# Patient Record
Sex: Female | Born: 1950 | Race: Black or African American | Hispanic: No | Smoking: Never smoker
Health system: Southern US, Community
[De-identification: ages and names within clinical notes are randomized; demographics above are authoritative.]

## PROBLEM LIST (undated history)

## (undated) DIAGNOSIS — G4730 Sleep apnea, unspecified: Secondary | ICD-10-CM

## (undated) DIAGNOSIS — R251 Tremor, unspecified: Secondary | ICD-10-CM

## (undated) DIAGNOSIS — E119 Type 2 diabetes mellitus without complications: Secondary | ICD-10-CM

## (undated) DIAGNOSIS — R059 Cough, unspecified: Secondary | ICD-10-CM

## (undated) DIAGNOSIS — I4891 Unspecified atrial fibrillation: Secondary | ICD-10-CM

## (undated) DIAGNOSIS — I10 Essential (primary) hypertension: Secondary | ICD-10-CM

## (undated) DIAGNOSIS — I499 Cardiac arrhythmia, unspecified: Secondary | ICD-10-CM

## (undated) DIAGNOSIS — K219 Gastro-esophageal reflux disease without esophagitis: Secondary | ICD-10-CM

## (undated) DIAGNOSIS — Z87442 Personal history of urinary calculi: Secondary | ICD-10-CM

## (undated) DIAGNOSIS — R05 Cough: Secondary | ICD-10-CM

## (undated) HISTORY — PX: JOINT REPLACEMENT: SHX530

## (undated) HISTORY — PX: COLONOSCOPY: SHX174

## (undated) HISTORY — PX: ABDOMINAL HYSTERECTOMY: SHX81

---

## 2012-03-22 ENCOUNTER — Ambulatory Visit: Payer: Self-pay | Admitting: Internal Medicine

## 2013-01-24 ENCOUNTER — Ambulatory Visit: Payer: Self-pay | Admitting: Internal Medicine

## 2016-06-01 ENCOUNTER — Other Ambulatory Visit: Payer: Self-pay | Admitting: Family Medicine

## 2016-06-01 DIAGNOSIS — Z13820 Encounter for screening for osteoporosis: Secondary | ICD-10-CM

## 2016-06-01 DIAGNOSIS — Z1231 Encounter for screening mammogram for malignant neoplasm of breast: Secondary | ICD-10-CM

## 2016-06-23 ENCOUNTER — Ambulatory Visit: Payer: Self-pay | Attending: Family Medicine

## 2017-01-04 ENCOUNTER — Other Ambulatory Visit: Payer: Self-pay | Admitting: Family Medicine

## 2017-01-04 DIAGNOSIS — M79602 Pain in left arm: Secondary | ICD-10-CM

## 2017-01-04 DIAGNOSIS — M7989 Other specified soft tissue disorders: Secondary | ICD-10-CM

## 2017-01-06 ENCOUNTER — Ambulatory Visit
Admission: RE | Admit: 2017-01-06 | Discharge: 2017-01-06 | Disposition: A | Discharge status: Home / Self Care | Payer: Medicare Other | Source: Ambulatory Visit | Attending: Family Medicine | Admitting: Family Medicine

## 2017-01-06 DIAGNOSIS — M79602 Pain in left arm: Secondary | ICD-10-CM

## 2017-01-06 DIAGNOSIS — M7989 Other specified soft tissue disorders: Secondary | ICD-10-CM

## 2017-12-11 ENCOUNTER — Other Ambulatory Visit: Payer: Self-pay | Admitting: Family Medicine

## 2017-12-11 DIAGNOSIS — Z13820 Encounter for screening for osteoporosis: Secondary | ICD-10-CM

## 2018-03-05 ENCOUNTER — Encounter: Payer: Self-pay | Admitting: *Deleted

## 2018-03-07 ENCOUNTER — Ambulatory Visit: Payer: Medicare Other | Admitting: Anesthesiology

## 2018-03-07 ENCOUNTER — Encounter: Payer: Self-pay | Admitting: *Deleted

## 2018-03-07 ENCOUNTER — Ambulatory Visit
Admission: RE | Admit: 2018-03-07 | Discharge: 2018-03-07 | Disposition: A | Discharge status: Home / Self Care | Payer: Medicare Other | Source: Ambulatory Visit | Attending: Ophthalmology | Admitting: Ophthalmology

## 2018-03-07 ENCOUNTER — Encounter
Admission: RE | Disposition: A | Discharge status: Home / Self Care | Payer: Self-pay | Source: Ambulatory Visit | Attending: Ophthalmology

## 2018-03-07 ENCOUNTER — Other Ambulatory Visit: Payer: Self-pay

## 2018-03-07 DIAGNOSIS — E119 Type 2 diabetes mellitus without complications: Secondary | ICD-10-CM | POA: Diagnosis not present

## 2018-03-07 DIAGNOSIS — G4730 Sleep apnea, unspecified: Secondary | ICD-10-CM | POA: Diagnosis not present

## 2018-03-07 DIAGNOSIS — Z79899 Other long term (current) drug therapy: Secondary | ICD-10-CM | POA: Insufficient documentation

## 2018-03-07 DIAGNOSIS — I10 Essential (primary) hypertension: Secondary | ICD-10-CM | POA: Insufficient documentation

## 2018-03-07 DIAGNOSIS — H2512 Age-related nuclear cataract, left eye: Secondary | ICD-10-CM | POA: Diagnosis not present

## 2018-03-07 DIAGNOSIS — Z7984 Long term (current) use of oral hypoglycemic drugs: Secondary | ICD-10-CM | POA: Diagnosis not present

## 2018-03-07 DIAGNOSIS — E7800 Pure hypercholesterolemia, unspecified: Secondary | ICD-10-CM | POA: Insufficient documentation

## 2018-03-07 HISTORY — DX: Essential (primary) hypertension: I10

## 2018-03-07 HISTORY — DX: Type 2 diabetes mellitus without complications: E11.9

## 2018-03-07 HISTORY — DX: Sleep apnea, unspecified: G47.30

## 2018-03-07 HISTORY — DX: Cough: R05

## 2018-03-07 HISTORY — DX: Gastro-esophageal reflux disease without esophagitis: K21.9

## 2018-03-07 HISTORY — DX: Tremor, unspecified: R25.1

## 2018-03-07 HISTORY — DX: Cough, unspecified: R05.9

## 2018-03-07 HISTORY — PX: CATARACT EXTRACTION W/PHACO: SHX586

## 2018-03-07 LAB — GLUCOSE, CAPILLARY: Glucose-Capillary: 138 mg/dL — ABNORMAL HIGH (ref 65–99)

## 2018-03-07 SURGERY — PHACOEMULSIFICATION, CATARACT, WITH IOL INSERTION
Anesthesia: Monitor Anesthesia Care | Site: Eye | Laterality: Left | Wound class: "Clean "

## 2018-03-07 MED ORDER — TRYPAN BLUE 0.06 % OP SOLN
OPHTHALMIC | Status: AC
  Filled 2018-03-07: qty 0.5

## 2018-03-07 MED ORDER — LIDOCAINE HCL (PF) 4 % IJ SOLN
INTRAMUSCULAR | Status: AC
  Filled 2018-03-07: qty 5

## 2018-03-07 MED ORDER — MIDAZOLAM HCL 2 MG/2ML IJ SOLN
INTRAMUSCULAR | Status: DC | PRN
  Administered 2018-03-07 (×2): 1 mg via INTRAVENOUS

## 2018-03-07 MED ORDER — MOXIFLOXACIN HCL 0.5 % OP SOLN: 1.0000 [drp] | OPHTHALMIC | Status: DC | PRN

## 2018-03-07 MED ORDER — NA CHONDROIT SULF-NA HYALURON 40-17 MG/ML IO SOLN
INTRAOCULAR | Status: AC
  Filled 2018-03-07: qty 1

## 2018-03-07 MED ORDER — MIDAZOLAM HCL 2 MG/2ML IJ SOLN
INTRAMUSCULAR | Status: AC
  Filled 2018-03-07: qty 2

## 2018-03-07 MED ORDER — EPINEPHRINE PF 1 MG/ML IJ SOLN
INTRAMUSCULAR | Status: AC
  Filled 2018-03-07: qty 1

## 2018-03-07 MED ORDER — ARMC OPHTHALMIC DILATING DROPS
OPHTHALMIC | Status: AC
  Administered 2018-03-07: 08:00:00
  Filled 2018-03-07: qty 0.4

## 2018-03-07 MED ORDER — BSS IO SOLN
INTRAOCULAR | Status: DC | PRN
  Administered 2018-03-07: 4 mL via OPHTHALMIC

## 2018-03-07 MED ORDER — FENTANYL CITRATE (PF) 100 MCG/2ML IJ SOLN
INTRAMUSCULAR | Status: DC | PRN
  Administered 2018-03-07: 50 ug via INTRAVENOUS

## 2018-03-07 MED ORDER — NA CHONDROIT SULF-NA HYALURON 40-17 MG/ML IO SOLN
INTRAOCULAR | Status: DC | PRN
  Administered 2018-03-07 (×2): 1 mL via INTRAOCULAR

## 2018-03-07 MED ORDER — ARMC OPHTHALMIC DILATING DROPS
1.0000 "application " | OPHTHALMIC | Status: AC
  Administered 2018-03-07 (×3): 1 via OPHTHALMIC

## 2018-03-07 MED ORDER — SODIUM CHLORIDE 0.9 % IV SOLN
INTRAVENOUS | Status: DC
  Administered 2018-03-07: 08:00:00 via INTRAVENOUS

## 2018-03-07 MED ORDER — POVIDONE-IODINE 5 % OP SOLN
OPHTHALMIC | Status: AC
  Filled 2018-03-07: qty 30

## 2018-03-07 MED ORDER — CARBACHOL 0.01 % IO SOLN
INTRAOCULAR | Status: DC | PRN
  Administered 2018-03-07: 0.5 mL via INTRAOCULAR

## 2018-03-07 MED ORDER — POVIDONE-IODINE 5 % OP SOLN
OPHTHALMIC | Status: DC | PRN
  Administered 2018-03-07: 1 via OPHTHALMIC

## 2018-03-07 MED ORDER — NA CHONDROIT SULF-NA HYALURON 40-17 MG/ML IO SOLN
INTRAOCULAR | Status: AC
Start: 2018-03-07 — End: ?
  Filled 2018-03-07: qty 1

## 2018-03-07 MED ORDER — MOXIFLOXACIN HCL 0.5 % OP SOLN
OPHTHALMIC | Status: DC | PRN
  Administered 2018-03-07: 0.2 mL via OPHTHALMIC

## 2018-03-07 MED ORDER — FENTANYL CITRATE (PF) 100 MCG/2ML IJ SOLN
INTRAMUSCULAR | Status: AC
  Filled 2018-03-07: qty 2

## 2018-03-07 MED ORDER — MOXIFLOXACIN HCL 0.5 % OP SOLN
OPHTHALMIC | Status: AC
  Filled 2018-03-07: qty 3

## 2018-03-07 MED ORDER — EPINEPHRINE PF 1 MG/ML IJ SOLN
INTRAOCULAR | Status: DC | PRN
  Administered 2018-03-07: 200 mL via OPHTHALMIC

## 2018-03-07 SURGICAL SUPPLY — 17 items
GLOVE BIO SURGEON STRL SZ8 (GLOVE) ×3 IMPLANT
GLOVE BIOGEL M 6.5 STRL (GLOVE) ×3 IMPLANT
GLOVE SURG LX 8.0 MICRO (GLOVE) ×2
GLOVE SURG LX STRL 8.0 MICRO (GLOVE) ×1 IMPLANT
GOWN STRL REUS W/ TWL LRG LVL3 (GOWN DISPOSABLE) ×2 IMPLANT
GOWN STRL REUS W/TWL LRG LVL3 (GOWN DISPOSABLE) ×4
LABEL CATARACT MEDS ST (LABEL) ×3 IMPLANT
LENS IOL TECNIS ITEC 13.5 (Intraocular Lens) ×2 IMPLANT
PACK CATARACT (MISCELLANEOUS) ×3 IMPLANT
PACK CATARACT BRASINGTON LX (MISCELLANEOUS) ×3 IMPLANT
PACK EYE AFTER SURG (MISCELLANEOUS) ×3 IMPLANT
RING MALYGIN (MISCELLANEOUS) ×2 IMPLANT
SOL BSS BAG (MISCELLANEOUS) ×3
SOLUTION BSS BAG (MISCELLANEOUS) ×1 IMPLANT
SYR 5ML LL (SYRINGE) ×3 IMPLANT
WATER STERILE IRR 250ML POUR (IV SOLUTION) ×3 IMPLANT
WIPE NON LINTING 3.25X3.25 (MISCELLANEOUS) ×3 IMPLANT

## 2018-03-07 NOTE — Anesthesia Postprocedure Evaluation (Signed)
Anesthesia Post Note  [name]  Procedure(s) Performed: CATARACT EXTRACTION PHACO AND INTRAOCULAR LENS PLACEMENT (IOC) (Left Eye)  Patient location during evaluation: PACU Anesthesia Type: MAC Level of consciousness: awake, awake and alert, oriented and patient cooperative Pain management: pain level controlled Vital Signs Assessment: post-procedure vital signs reviewed and stable Respiratory status: spontaneous breathing, nonlabored ventilation and respiratory function stable Cardiovascular status: stable Anesthetic complications: no     Last Vitals:  Vitals:   03/07/18 0734 03/07/18 0927  BP: 130/79   Pulse: (!) 54 (!) 51  Resp: 18 18  Temp: 36.4 C (!) 36.3 C  SpO2: 98% 100%    Last Pain:  Vitals:   03/07/18 0927  TempSrc: Temporal  PainSc: 0-No pain                 Kelden Lavallee,  Cheryln Balcom R

## 2018-03-07 NOTE — H&P (Signed)
All labs reviewed. Abnormal studies sent to patients PCP when indicated.  Previous H&P reviewed, patient examined, there are NO CHANGES.  Cynthia Fajardo Porfilio6/5/20198:35 AM

## 2018-03-07 NOTE — Anesthesia Post-op Follow-up Note (Signed)
Anesthesia QCDR form completed.        

## 2018-03-07 NOTE — Transfer of Care (Signed)
Immediate Anesthesia Transfer of Care Note  [name]  Procedure(s) Performed: CATARACT EXTRACTION PHACO AND INTRAOCULAR LENS PLACEMENT (IOC) (Left Eye)  Patient Location: PACU  Anesthesia Type:MAC  Level of Consciousness: awake, alert  and oriented  Airway & Oxygen Therapy: Patient Spontanous Breathing  Post-op Assessment: Report given to RN and Post -op Vital signs reviewed and stable  Post vital signs: Reviewed and stable  Last Vitals:  Vitals Value Taken Time  BP    Temp    Pulse    Resp    SpO2      Last Pain:  Vitals:   03/07/18 0734  TempSrc: Oral         Complications: No apparent anesthesia complications

## 2018-03-07 NOTE — Anesthesia Preprocedure Evaluation (Signed)
Anesthesia Evaluation  Patient identified by MRN, date of birth, ID band Patient awake    Reviewed: Allergy & Precautions, H&P , NPO status , reviewed documented beta blocker date and time   Airway Mallampati: III  TM Distance: >3 FB Neck ROM: full    Dental  (+) Teeth Intact   Pulmonary sleep apnea ,    Pulmonary exam normal        Cardiovascular hypertension, Normal cardiovascular exam     Neuro/Psych    GI/Hepatic GERD  Medicated and Controlled,  Endo/Other  diabetes  Renal/GU      Musculoskeletal   Abdominal   Peds  Hematology   Anesthesia Other Findings Past Medical History: No date: Cough     Comment:  CHRONIC No date: Diabetes mellitus without complication (HCC) No date: GERD (gastroesophageal reflux disease) No date: Hypertension No date: Sleep apnea No date: Tremor     Comment:  HEAD  Past Surgical History: No date: ABDOMINAL HYSTERECTOMY No date: CESAREAN SECTION No date: COLONOSCOPY No date: JOINT REPLACEMENT  BMI    Body Mass Index:  33.09 kg/m    Head tremor discussed w surgical team, will attempt to reduce w sedation  Reproductive/Obstetrics                             Anesthesia Physical Anesthesia Plan  ASA: III  Anesthesia Plan: MAC   Post-op Pain Management:    Induction:   PONV Risk Score and Plan: Treatment may vary due to age or medical condition and TIVA  Airway Management Planned:   Additional Equipment:   Intra-op Plan:   Post-operative Plan:   Informed Consent: I have reviewed the patients History and Physical, chart, labs and discussed the procedure including the risks, benefits and alternatives for the proposed anesthesia with the patient or authorized representative who has indicated his/her understanding and acceptance.   Dental Advisory Given  Plan Discussed with: CRNA  Anesthesia Plan Comments:         Anesthesia Quick  Evaluation

## 2018-03-07 NOTE — Anesthesia Procedure Notes (Signed)
Date/Time: 03/07/2018 9:01 AM Performed by: Junious SilkNoles, Malik Ruffino, CRNA Pre-anesthesia Checklist: Patient identified, Emergency Drugs available, Suction available, Patient being monitored and Timeout performed Oxygen Delivery Method: Nasal cannula

## 2018-03-07 NOTE — Discharge Instructions (Addendum)
FOLLOW DR. PORFILIO'S POSTOP EYE DROP INSTRUCTION SHEET AS REVIEWED.  Eye Surgery Discharge Instructions  Expect mild scratchy sensation or mild soreness. DO NOT RUB YOUR EYE!  The day of surgery:  Minimal physical activity, but bed rest is not required  No reading, computer work, or close hand work  No bending, lifting, or straining.  May watch TV  For 24 hours:  No driving, legal decisions, or alcoholic beverages  Safety precautions  Eat anything you prefer: It is better to start with liquids, then soup then solid foods.  Solar shield eyeglasses should be worn for comfort in the sunlight/patch while sleeping  Resume all regular medications including aspirin or Coumadin if these were discontinued prior to surgery. You may shower, bathe, shave, or wash your hair. Tylenol may be taken for mild discomfort.  Call your doctor if you experience significant pain, nausea, or vomiting, fever > 101 or other signs of infection. [phone] or [phone] Specific instructions:  Follow-up Information    Galen ManilaPorfilio, William, MD Follow up.   Specialty:  Ophthalmology Why:  03/08/18 @ 1:20 PM Contact information: [address] [place] [address] [phone]

## 2018-03-07 NOTE — Op Note (Signed)
PREOPERATIVE DIAGNOSIS:  Nuclear sclerotic cataract of the left eye.   POSTOPERATIVE DIAGNOSIS:  Nuclear sclerotic cataract of the left eye.   OPERATIVE PROCEDURE: Procedure(s): CATARACT EXTRACTION PHACO AND INTRAOCULAR LENS PLACEMENT (IOC)   SURGEON:  Galen ManilaWilliam Staceyann Knouff, MD.   ANESTHESIA:  Anesthesiologist: Christia ReadingHowell, Scott T, MD CRNA: Casey BurkittHoang, Thuy, CRNA; Junious SilkNoles, Mark, CRNA  1.      Managed anesthesia care. 2.     0.731ml of Shugarcaine was instilled following the paracentesis   COMPLICATIONS:  None.   TECHNIQUE:   Stop and chop   DESCRIPTION OF PROCEDURE:  The patient was examined and consented in the preoperative holding area where the aforementioned topical anesthesia was applied to the left eye and then brought back to the Operating Room where the left eye was prepped and draped in the usual sterile ophthalmic fashion and a lid speculum was placed. A paracentesis was created with the side port blade and the anterior chamber was filled with viscoelastic. A near clear corneal incision was performed with the steel keratome. A continuous curvilinear capsulorrhexis was performed with a cystotome followed by the capsulorrhexis forceps. Hydrodissection and hydrodelineation were carried out with BSS on a blunt cannula. The lens was removed in a stop and chop  technique and the remaining cortical material was removed with the irrigation-aspiration handpiece. The capsular bag was inflated with viscoelastic and the Technis [id number] lens was placed in the capsular bag without complication. The remaining viscoelastic was removed from the eye with the irrigation-aspiration handpiece. The wounds were hydrated. The anterior chamber was flushed with Miostat and the eye was inflated to physiologic pressure. 0.751ml Vigamox was placed in the anterior chamber. The wounds were found to be water tight. The eye was dressed with Vigamox. The patient was given protective glasses to wear throughout the day and a shield with which  to sleep tonight. The patient was also given drops with which to begin a drop regimen today and will follow-up with me in one day. Implant Name Type Inv. Item Serial No. Manufacturer Lot No. LRB No. Used  LENS IOL DIOP 13.5 - [id number] Intraocular Lens LENS IOL DIOP 13.5 [id number] AMO  Left 1    Procedure(s) with comments: CATARACT EXTRACTION PHACO AND INTRAOCULAR LENS PLACEMENT (IOC) (Left) - Lot # [id number] H US: 04:19.8 AP%: 19.9 CDE: 51.70  Electronically signed: Galen ManilaWilliam Jaycion Treml 03/07/2018 9:25 AM

## 2019-01-07 ENCOUNTER — Inpatient Hospital Stay: Payer: Medicare Other | Admitting: Anesthesiology

## 2019-01-07 ENCOUNTER — Other Ambulatory Visit: Payer: Self-pay

## 2019-01-07 ENCOUNTER — Encounter
Admission: EM | Disposition: A | Discharge status: Home / Self Care | Payer: Self-pay | Source: Home / Self Care | Attending: Internal Medicine

## 2019-01-07 ENCOUNTER — Inpatient Hospital Stay
Admission: EM | Admit: 2019-01-07 | Discharge: 2019-01-08 | DRG: 661 | Disposition: A | Discharge status: Home / Self Care | Payer: Medicare Other | Attending: Internal Medicine | Admitting: Internal Medicine

## 2019-01-07 ENCOUNTER — Emergency Department: Payer: Medicare Other

## 2019-01-07 DIAGNOSIS — I10 Essential (primary) hypertension: Secondary | ICD-10-CM | POA: Diagnosis present

## 2019-01-07 DIAGNOSIS — K219 Gastro-esophageal reflux disease without esophagitis: Secondary | ICD-10-CM | POA: Diagnosis present

## 2019-01-07 DIAGNOSIS — R1011 Right upper quadrant pain: Secondary | ICD-10-CM

## 2019-01-07 DIAGNOSIS — E785 Hyperlipidemia, unspecified: Secondary | ICD-10-CM | POA: Diagnosis present

## 2019-01-07 DIAGNOSIS — Z79899 Other long term (current) drug therapy: Secondary | ICD-10-CM | POA: Diagnosis not present

## 2019-01-07 DIAGNOSIS — Z8249 Family history of ischemic heart disease and other diseases of the circulatory system: Secondary | ICD-10-CM

## 2019-01-07 DIAGNOSIS — N136 Pyonephrosis: Secondary | ICD-10-CM | POA: Diagnosis present

## 2019-01-07 DIAGNOSIS — E119 Type 2 diabetes mellitus without complications: Secondary | ICD-10-CM | POA: Diagnosis present

## 2019-01-07 DIAGNOSIS — N132 Hydronephrosis with renal and ureteral calculous obstruction: Secondary | ICD-10-CM | POA: Diagnosis not present

## 2019-01-07 DIAGNOSIS — N390 Urinary tract infection, site not specified: Secondary | ICD-10-CM

## 2019-01-07 DIAGNOSIS — G4730 Sleep apnea, unspecified: Secondary | ICD-10-CM | POA: Diagnosis present

## 2019-01-07 DIAGNOSIS — Z90710 Acquired absence of both cervix and uterus: Secondary | ICD-10-CM

## 2019-01-07 DIAGNOSIS — N3289 Other specified disorders of bladder: Secondary | ICD-10-CM | POA: Diagnosis present

## 2019-01-07 DIAGNOSIS — N23 Unspecified renal colic: Secondary | ICD-10-CM | POA: Diagnosis not present

## 2019-01-07 DIAGNOSIS — Z7984 Long term (current) use of oral hypoglycemic drugs: Secondary | ICD-10-CM

## 2019-01-07 DIAGNOSIS — N139 Obstructive and reflux uropathy, unspecified: Secondary | ICD-10-CM | POA: Diagnosis present

## 2019-01-07 DIAGNOSIS — N200 Calculus of kidney: Secondary | ICD-10-CM

## 2019-01-07 DIAGNOSIS — H409 Unspecified glaucoma: Secondary | ICD-10-CM | POA: Diagnosis present

## 2019-01-07 HISTORY — PX: CYSTOSCOPY W/ RETROGRADES: SHX1426

## 2019-01-07 HISTORY — PX: CYSTOSCOPY WITH STENT PLACEMENT: SHX5790

## 2019-01-07 LAB — CBC WITH DIFFERENTIAL/PLATELET
Abs Immature Granulocytes: 0.03 10*3/uL (ref 0.00–0.07)
Basophils Absolute: 0 10*3/uL (ref 0.0–0.1)
Basophils Relative: 0 %
Eosinophils Absolute: 0.2 10*3/uL (ref 0.0–0.5)
Eosinophils Relative: 2 %
HCT: 40.2 % (ref 36.0–46.0)
Hemoglobin: 13.1 g/dL (ref 12.0–15.0)
Immature Granulocytes: 0 %
Lymphocytes Relative: 27 %
Lymphs Abs: 2.5 10*3/uL (ref 0.7–4.0)
MCH: 26.5 pg (ref 26.0–34.0)
MCHC: 32.6 g/dL (ref 30.0–36.0)
MCV: 81.2 fL (ref 80.0–100.0)
Monocytes Absolute: 0.5 10*3/uL (ref 0.1–1.0)
Monocytes Relative: 5 %
Neutro Abs: 6.3 10*3/uL (ref 1.7–7.7)
Neutrophils Relative %: 66 %
Platelets: 291 10*3/uL (ref 150–400)
RBC: 4.95 MIL/uL (ref 3.87–5.11)
RDW: 14 % (ref 11.5–15.5)
WBC: 9.5 10*3/uL (ref 4.0–10.5)
nRBC: 0 % (ref 0.0–0.2)

## 2019-01-07 LAB — URINALYSIS, COMPLETE (UACMP) WITH MICROSCOPIC
Bilirubin Urine: NEGATIVE
Glucose, UA: NEGATIVE mg/dL
Ketones, ur: NEGATIVE mg/dL
Leukocytes,Ua: NEGATIVE
Nitrite: POSITIVE — AB
Protein, ur: NEGATIVE mg/dL
Specific Gravity, Urine: >1.046 — ABNORMAL HIGH (ref 1.005–1.030)
pH: 5 (ref 5.0–8.0)

## 2019-01-07 LAB — COMPREHENSIVE METABOLIC PANEL
ALT: 20 U/L (ref 0–44)
AST: 19 U/L (ref 15–41)
Albumin: 4.1 g/dL (ref 3.5–5.0)
Alkaline Phosphatase: 89 U/L (ref 38–126)
Anion gap: 11 (ref 5–15)
BUN: 18 mg/dL (ref 8–23)
CO2: 28 mmol/L (ref 22–32)
Calcium: 8.7 mg/dL — ABNORMAL LOW (ref 8.9–10.3)
Chloride: 103 mmol/L (ref 98–111)
Creatinine, Ser: 1.06 mg/dL — ABNORMAL HIGH (ref 0.44–1.00)
GFR calc Af Amer: >60 mL/min (ref >60)
GFR calc non Af Amer: 54 mL/min — ABNORMAL LOW (ref >60)
Glucose, Bld: 165 mg/dL — ABNORMAL HIGH (ref 70–99)
Potassium: 3.2 mmol/L — ABNORMAL LOW (ref 3.5–5.1)
Sodium: 142 mmol/L (ref 135–145)
Total Bilirubin: 0.6 mg/dL (ref 0.3–1.2)
Total Protein: 7.4 g/dL (ref 6.5–8.1)

## 2019-01-07 LAB — GLUCOSE, CAPILLARY
Glucose-Capillary: 120 mg/dL — ABNORMAL HIGH (ref 70–99)
Glucose-Capillary: 114 mg/dL — ABNORMAL HIGH (ref 70–99)
Glucose-Capillary: 177 mg/dL — ABNORMAL HIGH (ref 70–99)

## 2019-01-07 LAB — LIPASE, BLOOD: Lipase: 44 U/L (ref 11–51)

## 2019-01-07 SURGERY — CYSTOSCOPY, WITH STENT INSERTION
Anesthesia: General | Site: Ureter | Laterality: Right

## 2019-01-07 MED ORDER — SODIUM CHLORIDE 0.9 % IV SOLN
INTRAVENOUS | Status: DC
  Administered 2019-01-07 – 2019-01-08 (×2): via INTRAVENOUS

## 2019-01-07 MED ORDER — LIDOCAINE HCL (PF) 2 % IJ SOLN
INTRAMUSCULAR | Status: AC
  Filled 2019-01-07: qty 10

## 2019-01-07 MED ORDER — SODIUM CHLORIDE 0.9 % IV SOLN
1.0000 g | INTRAVENOUS | Status: DC
  Administered 2019-01-07 – 2019-01-08 (×2): 1 g via INTRAVENOUS
  Filled 2019-01-07: qty 1
  Filled 2019-01-07: qty 10

## 2019-01-07 MED ORDER — ONDANSETRON HCL 4 MG/2ML IJ SOLN
4.0000 mg | Freq: Once | INTRAMUSCULAR | Status: AC
  Administered 2019-01-07: 09:00:00 4 mg via INTRAVENOUS

## 2019-01-07 MED ORDER — FENTANYL CITRATE (PF) 100 MCG/2ML IJ SOLN
INTRAMUSCULAR | Status: AC
  Filled 2019-01-07: qty 2

## 2019-01-07 MED ORDER — IOHEXOL 300 MG/ML  SOLN
100.0000 mL | Freq: Once | INTRAMUSCULAR | Status: AC | PRN
  Administered 2019-01-07: 100 mL via INTRAVENOUS

## 2019-01-07 MED ORDER — HYDROCODONE-ACETAMINOPHEN 5-325 MG PO TABS: 1.0000 | ORAL_TABLET | ORAL | Status: DC | PRN

## 2019-01-07 MED ORDER — DEXAMETHASONE SODIUM PHOSPHATE 10 MG/ML IJ SOLN
INTRAMUSCULAR | Status: AC
  Filled 2019-01-07: qty 1

## 2019-01-07 MED ORDER — LIDOCAINE HCL (CARDIAC) PF 100 MG/5ML IV SOSY
PREFILLED_SYRINGE | INTRAVENOUS | Status: DC | PRN
  Administered 2019-01-07: 40 mg via INTRAVENOUS

## 2019-01-07 MED ORDER — ROCURONIUM BROMIDE 100 MG/10ML IV SOLN
INTRAVENOUS | Status: DC | PRN
  Administered 2019-01-07: 30 mg via INTRAVENOUS

## 2019-01-07 MED ORDER — PROMETHAZINE HCL 25 MG/ML IJ SOLN: 6.2500 mg | INTRAMUSCULAR | Status: DC | PRN

## 2019-01-07 MED ORDER — MORPHINE SULFATE (PF) 4 MG/ML IV SOLN
4.0000 mg | Freq: Once | INTRAVENOUS | Status: AC
  Administered 2019-01-07: 4 mg via INTRAVENOUS
  Filled 2019-01-07: qty 1

## 2019-01-07 MED ORDER — PROPOFOL 10 MG/ML IV BOLUS
INTRAVENOUS | Status: DC | PRN
  Administered 2019-01-07: 130 mg via INTRAVENOUS

## 2019-01-07 MED ORDER — SUCCINYLCHOLINE CHLORIDE 20 MG/ML IJ SOLN
INTRAMUSCULAR | Status: DC | PRN
  Administered 2019-01-07: 100 mg via INTRAVENOUS

## 2019-01-07 MED ORDER — ONDANSETRON HCL 4 MG/2ML IJ SOLN
INTRAMUSCULAR | Status: DC | PRN
  Administered 2019-01-07: 4 mg via INTRAVENOUS

## 2019-01-07 MED ORDER — ACETAMINOPHEN 650 MG RE SUPP: 650.0000 mg | Freq: Four times a day (QID) | RECTAL | Status: DC | PRN

## 2019-01-07 MED ORDER — MIDAZOLAM HCL 2 MG/2ML IJ SOLN
INTRAMUSCULAR | Status: AC
  Filled 2019-01-07: qty 2

## 2019-01-07 MED ORDER — PROPOFOL 10 MG/ML IV BOLUS
INTRAVENOUS | Status: AC
  Filled 2019-01-07: qty 20

## 2019-01-07 MED ORDER — ONDANSETRON HCL 4 MG/2ML IJ SOLN
INTRAMUSCULAR | Status: AC
  Filled 2019-01-07: qty 2

## 2019-01-07 MED ORDER — SENNOSIDES-DOCUSATE SODIUM 8.6-50 MG PO TABS: 1.0000 | ORAL_TABLET | Freq: Every evening | ORAL | Status: DC | PRN

## 2019-01-07 MED ORDER — BRIMONIDINE TARTRATE-TIMOLOL 0.2-0.5 % OP SOLN
1.0000 [drp] | Freq: Every day | OPHTHALMIC | Status: DC
  Filled 2019-01-07: qty 5

## 2019-01-07 MED ORDER — PHENYLEPHRINE HCL 10 MG/ML IJ SOLN
INTRAMUSCULAR | Status: AC
  Filled 2019-01-07: qty 1

## 2019-01-07 MED ORDER — LACTATED RINGERS IV SOLN
INTRAVENOUS | Status: DC | PRN
  Administered 2019-01-07: 15:00:00 via INTRAVENOUS

## 2019-01-07 MED ORDER — SODIUM CHLORIDE 0.9 % IV SOLN: 1.0000 g | Freq: Once | INTRAVENOUS | Status: DC

## 2019-01-07 MED ORDER — ONDANSETRON HCL 4 MG/2ML IJ SOLN
4.0000 mg | Freq: Once | INTRAMUSCULAR | Status: AC
  Administered 2019-01-07: 4 mg via INTRAVENOUS
  Filled 2019-01-07: qty 2

## 2019-01-07 MED ORDER — INSULIN ASPART 100 UNIT/ML ~~LOC~~ SOLN: 0.0000 [IU] | Freq: Every day | SUBCUTANEOUS | Status: DC

## 2019-01-07 MED ORDER — ONDANSETRON HCL 4 MG/2ML IJ SOLN: 4.0000 mg | Freq: Four times a day (QID) | INTRAMUSCULAR | Status: DC | PRN

## 2019-01-07 MED ORDER — INSULIN ASPART 100 UNIT/ML ~~LOC~~ SOLN
0.0000 [IU] | Freq: Three times a day (TID) | SUBCUTANEOUS | Status: DC
  Administered 2019-01-08: 2 [IU] via SUBCUTANEOUS
  Administered 2019-01-08: 3 [IU] via SUBCUTANEOUS
  Filled 2019-01-07 (×2): qty 1

## 2019-01-07 MED ORDER — MIDAZOLAM HCL 2 MG/2ML IJ SOLN
INTRAMUSCULAR | Status: DC | PRN
  Administered 2019-01-07: 1 mg via INTRAVENOUS

## 2019-01-07 MED ORDER — LATANOPROST 0.005 % OP SOLN
1.0000 [drp] | Freq: Every day | OPHTHALMIC | Status: DC
  Administered 2019-01-07: 22:00:00 1 [drp] via OPHTHALMIC
  Filled 2019-01-07 (×2): qty 2.5

## 2019-01-07 MED ORDER — IOHEXOL 240 MG/ML SOLN
50.0000 mL | Freq: Once | INTRAMUSCULAR | Status: AC | PRN
  Administered 2019-01-07: 50 mL via ORAL

## 2019-01-07 MED ORDER — ACETAMINOPHEN 325 MG PO TABS: 650.0000 mg | ORAL_TABLET | Freq: Four times a day (QID) | ORAL | Status: DC | PRN

## 2019-01-07 MED ORDER — ATORVASTATIN CALCIUM 20 MG PO TABS
40.0000 mg | ORAL_TABLET | Freq: Every day | ORAL | Status: DC
  Administered 2019-01-07 – 2019-01-08 (×2): 40 mg via ORAL
  Filled 2019-01-07 (×2): qty 2

## 2019-01-07 MED ORDER — IOHEXOL 180 MG/ML  SOLN
INTRAMUSCULAR | Status: DC | PRN
  Administered 2019-01-07: 7 mL

## 2019-01-07 MED ORDER — SUGAMMADEX SODIUM 200 MG/2ML IV SOLN
INTRAVENOUS | Status: AC
  Filled 2019-01-07: qty 2

## 2019-01-07 MED ORDER — ONDANSETRON HCL 4 MG PO TABS: 4.0000 mg | ORAL_TABLET | Freq: Four times a day (QID) | ORAL | Status: DC | PRN

## 2019-01-07 MED ORDER — FENTANYL CITRATE (PF) 100 MCG/2ML IJ SOLN: 25.0000 ug | INTRAMUSCULAR | Status: DC | PRN

## 2019-01-07 MED ORDER — FENTANYL CITRATE (PF) 100 MCG/2ML IJ SOLN
INTRAMUSCULAR | Status: DC | PRN
  Administered 2019-01-07: 50 ug via INTRAVENOUS
  Administered 2019-01-07: 25 ug via INTRAVENOUS

## 2019-01-07 MED ORDER — BRIMONIDINE TARTRATE 0.2 % OP SOLN
1.0000 [drp] | Freq: Every day | OPHTHALMIC | Status: DC
  Administered 2019-01-07 – 2019-01-08 (×2): 1 [drp] via OPHTHALMIC
  Filled 2019-01-07: qty 5

## 2019-01-07 MED ORDER — OXYBUTYNIN CHLORIDE 5 MG PO TABS: 5.0000 mg | ORAL_TABLET | Freq: Three times a day (TID) | ORAL | Status: DC | PRN

## 2019-01-07 MED ORDER — PHENYLEPHRINE HCL 10 MG/ML IJ SOLN
INTRAMUSCULAR | Status: DC | PRN
  Administered 2019-01-07: 100 ug via INTRAVENOUS

## 2019-01-07 MED ORDER — LATANOPROST 0.005 % OP SOLN
1.0000 [drp] | Freq: Every day | OPHTHALMIC | Status: DC
  Filled 2019-01-07: qty 2.5

## 2019-01-07 MED ORDER — POTASSIUM CHLORIDE CRYS ER 20 MEQ PO TBCR
40.0000 meq | EXTENDED_RELEASE_TABLET | Freq: Once | ORAL | Status: AC
  Administered 2019-01-07: 18:00:00 40 meq via ORAL
  Filled 2019-01-07: qty 2

## 2019-01-07 MED ORDER — MORPHINE SULFATE (PF) 2 MG/ML IV SOLN
2.0000 mg | Freq: Once | INTRAVENOUS | Status: AC
  Administered 2019-01-07: 07:00:00 2 mg via INTRAVENOUS
  Filled 2019-01-07: qty 1

## 2019-01-07 MED ORDER — SUGAMMADEX SODIUM 200 MG/2ML IV SOLN
INTRAVENOUS | Status: DC | PRN
  Administered 2019-01-07: 181.4 mg via INTRAVENOUS

## 2019-01-07 MED ORDER — MORPHINE SULFATE (PF) 2 MG/ML IV SOLN
2.0000 mg | Freq: Once | INTRAVENOUS | Status: AC
  Administered 2019-01-07: 06:00:00 2 mg via INTRAVENOUS
  Filled 2019-01-07: qty 1

## 2019-01-07 MED ORDER — ENOXAPARIN SODIUM 40 MG/0.4ML ~~LOC~~ SOLN
40.0000 mg | SUBCUTANEOUS | Status: DC
  Administered 2019-01-08: 40 mg via SUBCUTANEOUS
  Filled 2019-01-07: qty 0.4

## 2019-01-07 MED ORDER — EPHEDRINE SULFATE 50 MG/ML IJ SOLN
INTRAMUSCULAR | Status: AC
  Filled 2019-01-07: qty 1

## 2019-01-07 MED ORDER — MORPHINE SULFATE (PF) 2 MG/ML IV SOLN: 2.0000 mg | INTRAVENOUS | Status: DC | PRN

## 2019-01-07 MED ORDER — TIMOLOL MALEATE 0.5 % OP SOLN
1.0000 [drp] | Freq: Every day | OPHTHALMIC | Status: DC
  Administered 2019-01-07 – 2019-01-08 (×2): 1 [drp] via OPHTHALMIC
  Filled 2019-01-07: qty 5

## 2019-01-07 SURGICAL SUPPLY — 19 items
BAG DRAIN CYSTO-URO LG1000N (MISCELLANEOUS) ×3 IMPLANT
BRUSH SCRUB EZ  4% CHG (MISCELLANEOUS)
BRUSH SCRUB EZ 4% CHG (MISCELLANEOUS) IMPLANT
CATH URETL 5X70 OPEN END (CATHETERS) ×3 IMPLANT
GLIDEWIRE STR 0.035 150CM 3CM (WIRE) ×3 IMPLANT
GLOVE BIO SURGEON STRL SZ8 (GLOVE) ×3 IMPLANT
GOWN STRL REUS W/ TWL XL LVL3 (GOWN DISPOSABLE) ×1 IMPLANT
GOWN STRL REUS W/TWL XL LVL3 (GOWN DISPOSABLE) ×2
GUIDEWIRE STR DUAL SENSOR (WIRE) ×3 IMPLANT
KIT TURNOVER CYSTO (KITS) ×3 IMPLANT
PACK CYSTO AR (MISCELLANEOUS) ×3 IMPLANT
SET CYSTO W/LG BORE CLAMP LF (SET/KITS/TRAYS/PACK) ×3 IMPLANT
SOL .9 NS 3000ML IRR  AL (IV SOLUTION) ×2
SOL .9 NS 3000ML IRR UROMATIC (IV SOLUTION) ×1 IMPLANT
STENT URET 6FRX24 CONTOUR (STENTS) ×3 IMPLANT
STENT URET 6FRX26 CONTOUR (STENTS) IMPLANT
SURGILUBE 2OZ TUBE FLIPTOP (MISCELLANEOUS) ×3 IMPLANT
SYR 10ML LL (SYRINGE) ×3 IMPLANT
WATER STERILE IRR 1000ML POUR (IV SOLUTION) ×3 IMPLANT

## 2019-01-07 NOTE — ED Provider Notes (Signed)
[facility] Emergency Department Provider Note    First MD Initiated Contact with Patient 01/07/19 [phone]     (approximate)  I have reviewed the triage vital signs and the nursing notes.   HISTORY  Chief Complaint Abdominal Pain    HPI Cynthia Lucas is a 68 y.o. female with medical history as listed below including diabetes hypertension presents emergency department with cute onset of right upper quadrant abdominal pain which currently 10 out of 10 with associated nausea and vomiting which began this morning.        Past Medical History:  Diagnosis Date  . Cough    CHRONIC  . Diabetes mellitus without complication (HCC)   . GERD (gastroesophageal reflux disease)   . Hypertension   . Sleep apnea   . Tremor    HEAD    Patient Active Problem List   Diagnosis Date Noted  . Obstructive uropathy 01/07/2019    Past Surgical History:  Procedure Laterality Date  . ABDOMINAL HYSTERECTOMY    . CATARACT EXTRACTION W/PHACO Left 03/07/2018   Procedure: CATARACT EXTRACTION PHACO AND INTRAOCULAR LENS PLACEMENT (IOC);  Surgeon: Galen Manila, MD;  Location: [facility] ORS;  Service: Ophthalmology;  Laterality: Left;  Lot # [id number] H [place]: 04:19.8 AP%: 19.9 CDE: 51.70  . CESAREAN SECTION    . COLONOSCOPY    . JOINT REPLACEMENT      Prior to Admission medications   Medication Sig Start Date End Date Taking? Authorizing Provider  atorvastatin (LIPITOR) 40 MG tablet Take 40 mg by mouth daily.   Yes [provider]  bimatoprost (LUMIGAN) 0.03 % ophthalmic solution Place 1 drop into the left eye at bedtime.   Yes [provider]  Brimonidine Tartrate-Timolol (COMBIGAN OP) Apply 1 drop to eye once. To each eye   Yes [provider]  hydrochlorothiazide (HYDRODIURIL) 25 MG tablet Take 25 mg by mouth daily.   Yes [provider]  metFORMIN (GLUCOPHAGE) 500 MG tablet Take 500 mg by mouth once.   Yes [provider]   ranitidine (ZANTAC) 150 MG tablet Take 150 mg by mouth 2 (two) times daily.   Yes [provider]    Allergies Patient has no known allergies.  Family History  Problem Relation Age of Onset  . Heart disease Mother     Social History Social History   Tobacco Use  . Smoking status: Never Smoker  . Smokeless tobacco: Never Used  Substance Use Topics  . Alcohol use: Yes    Comment: OCCAS  . Drug use: Not on file    Review of Systems Constitutional: No fever/chills Eyes: No visual changes. ENT: No sore throat. Cardiovascular: Denies chest pain. Respiratory: Denies shortness of breath. Gastrointestinal: Positive for abdominal pain nausea and vomiting no diarrhea.  No constipation. Genitourinary: Negative for dysuria. Musculoskeletal: Negative for neck pain.  Negative for back pain. Integumentary: Negative for rash. Neurological: Negative for headaches, focal weakness or numbness.  ____________________________________________   PHYSICAL EXAM:  VITAL SIGNS: ED Triage Vitals  Enc Vitals Group     BP 01/07/19 0603 (!) 151/133     Pulse Rate 01/07/19 0603 63     Resp 01/07/19 0603 18     Temp 01/07/19 0603 98.6 F (37 C)     Temp Source 01/07/19 0603 Oral     SpO2 01/07/19 0603 97 %     Weight 01/07/19 0555 90.7 kg (200 lb)     Height 01/07/19 0555 1.702 m (5\' 7" )  Head Circumference --      Peak Flow --      Pain Score 01/07/19 0555 8     Pain Loc --      Pain Edu? --      Excl. in GC? --     Constitutional: Alert and oriented.Apparent discomfort Eyes: Conjunctivae are normal.  Head: Atraumatic. Neck: No stridor.   Cardiovascular: Normal rate, regular rhythm. Good peripheral circulation. Grossly normal heart sounds. Respiratory: Normal respiratory effort.  No retractions. Lungs CTAB. Gastrointestinal: Soft and nontender. No distention.  Musculoskeletal: No lower extremity tenderness nor edema. No gross deformities of extremities. Neurologic:   Normal speech and language. No gross focal neurologic deficits are appreciated.  Skin:  Skin is warm, dry and intact. No rash noted. Psychiatric: Mood and affect are normal. Speech and behavior are normal.  ____________________________________________   LABS (all labs ordered are listed, but only abnormal results are displayed)  Labs Reviewed  COMPREHENSIVE METABOLIC PANEL - Abnormal; Notable for the following components:      Result Value   Potassium 3.2 (*)    Glucose, Bld 165 (*)    Creatinine, Ser 1.06 (*)    Calcium 8.7 (*)    GFR calc non Af Amer 54 (*)    All other components within normal limits  URINALYSIS, COMPLETE (UACMP) WITH MICROSCOPIC - Abnormal; Notable for the following components:   Color, Urine YELLOW (*)    APPearance CLEAR (*)    Specific Gravity, Urine >1.046 (*)    Hgb urine dipstick SMALL (*)    Nitrite POSITIVE (*)    Bacteria, UA RARE (*)    All other components within normal limits  GLUCOSE, CAPILLARY - Abnormal; Notable for the following components:   Glucose-Capillary 120 (*)    All other components within normal limits  GLUCOSE, CAPILLARY - Abnormal; Notable for the following components:   Glucose-Capillary 114 (*)    All other components within normal limits  GLUCOSE, CAPILLARY - Abnormal; Notable for the following components:   Glucose-Capillary 177 (*)    All other components within normal limits  URINE CULTURE  URINE CULTURE  CBC WITH DIFFERENTIAL/PLATELET  LIPASE, BLOOD  HIV ANTIBODY (ROUTINE TESTING W REFLEX)  BASIC METABOLIC PANEL  CBC     RADIOLOGY I, [place] Cynthia Lucas, personally viewed and evaluated these images (plain radiographs) as part of my medical decision making, as well as reviewing the written report by the radiologist.  ED MD interpretation:  Gallstones  Official radiology report(s): Ct Abdomen Pelvis W Contrast  Result Date: 01/07/2019 CLINICAL DATA:  Right-sided abdominal pain. Hydronephrosis on ultrasound of the  abdomen EXAM: CT ABDOMEN AND PELVIS WITH CONTRAST TECHNIQUE: Multidetector CT imaging of the abdomen and pelvis was performed using the standard protocol following bolus administration of intravenous contrast. CONTRAST:  OMNIPAQUE IOHEXOL 300 MG/ML  SOLN COMPARISON:  Right upper quadrant ultrasound from earlier today FINDINGS: Lower chest: Coronary atherosclerosis. Mild scarring in the right lower lobe. Hepatobiliary: No focal liver abnormality.Numerous gallstones by ultrasound are not apparent by CT. Pancreas: Benign-appearing coarse calcification. Spleen: Unremarkable. Adrenals/Urinary Tract: Negative adrenals. Right hydronephrosis with delayed renal expansion and extensive perinephric edema. There is right hydroureter to the level of a 4 mm stone at the iliac crossing. 4 mm stone is also seen at the right UPJ. No left-sided hydronephrosis or ureteral calculus. Unremarkable bladder. Stomach/Bowel: No obstruction. No appendicitis. Mild colonic diverticulosis. Vascular/Lymphatic: No acute vascular abnormality. No mass or adenopathy. Reproductive:Hysterectomy. Other: No ascites or pneumoperitoneum. Musculoskeletal:  No acute abnormalities. Spondylosis with multilevel thoracic ankylosis. Lower lumbar degenerative facet spurring with L4-5 and L5-S1 ankylosis. IMPRESSION: 1. Two obstructing right renal calculi, both measuring 4 mm. These are located at the UPJ and iliac crossing. 2. Cholelithiasis by ultrasound is largely occult by CT. No evidence of cholecystitis. 3. Coronary atherosclerosis. Electronically Signed   By: Marnee SpringJonathon  Watts M.D.   On: 01/07/2019 08:58   [place] Abdomen Limited Ruq  Result Date: 01/07/2019 CLINICAL DATA:  Right upper quadrant pain EXAM: ULTRASOUND ABDOMEN LIMITED RIGHT UPPER QUADRANT COMPARISON:  None. FINDINGS: Gallbladder: Numerous gallstones filling the gallbladder with wall echo shadow sign. Where visible the wall of the gallbladder is not thickened. No visible pericholecystic edema.  There is focal tenderness per report. Common bile duct: Diameter: 7 mm.  Where visualized, no filling defect. Liver: No focal lesion identified. Within normal limits in parenchymal echogenicity. Portal vein is patent on color Doppler imaging with normal direction of blood flow towards the liver. Other: Incidental right moderate hydronephrosis. IMPRESSION: 1. Gallstones fill the gallbladder. The gallbladder is focally tender but there is no visible wall thickening or pericholecystic edema typical of acute cholecystitis. The degree of shadowing calculi limits assessment of the gallbladder. 2. Borderline common bile duct dilatation, please correlate with biliary labs. 3. Right hydronephrosis. Electronically Signed   By: Marnee SpringJonathon  Watts M.D.   On: 01/07/2019 07:11      Procedures   ____________________________________________   INITIAL IMPRESSION / MDM / ASSESSMENT AND PLAN / ED COURSE  As part of my medical decision making, I reviewed the following data within the electronic MEDICAL RECORD NUMBER   Cynthia KeySharon Webb was evaluated in Emergency Department on 01/07/2019 for the symptoms described in the history of present illness. She was evaluated in the context of the global COVID-19 pandemic, which necessitated consideration that the patient might be at risk for infection with the SARS-CoV-2 virus that causes COVID-19. Institutional protocols and algorithms that pertain to the evaluation of patients at risk for COVID-19 are in a state of rapid change based on information released by regulatory bodies including the CDC and federal and state organizations. These policies and algorithms were followed during the patient's care in the ED.    68 year old female presented with above-stated history and physical exam consistent with possible cholelithiasis, choledocholithiasis, ureterolithiasis.  Ultrasound performed revealed evidence of cholelithiasis with dilated ureter and as such CT scan of the abdomen pelvis was  ordered and pending at this time.  On reevaluation after patient received morphine current pain score is 5 out of 10 however patient now states pain predominantly in the right lower quadrant increase in the possibility of possible ureterolithiasis.  Patient will be given an additional 2 mg of morphine now.  Patient's care transferred to Dr. Cyril LoosenKinner __________________________________  FINAL CLINICAL IMPRESSION(S) / ED DIAGNOSES  Final diagnoses:  RUQ pain  Kidney stone  Urinary tract infection without hematuria, site unspecified     MEDICATIONS GIVEN DURING THIS VISIT:  Medications  atorvastatin (LIPITOR) tablet 40 mg (40 mg Oral Given 01/07/19 1756)  enoxaparin (LOVENOX) injection 40 mg (has no administration in time range)  0.9 %  sodium chloride infusion ( Intravenous Stopped 01/07/19 1801)  acetaminophen (TYLENOL) tablet 650 mg (has no administration in time range)    Or  acetaminophen (TYLENOL) suppository 650 mg (has no administration in time range)  HYDROcodone-acetaminophen (NORCO/VICODIN) 5-325 MG per tablet 1-2 tablet (has no administration in time range)  senna-docusate (Senokot-S) tablet 1 tablet (has no administration in  time range)  ondansetron (ZOFRAN) tablet 4 mg (has no administration in time range)    Or  ondansetron (ZOFRAN) injection 4 mg (has no administration in time range)  cefTRIAXone (ROCEPHIN) 1 g in sodium chloride 0.9 % 100 mL IVPB (0 g Intravenous Stopped 01/07/19 1720)  morphine 2 MG/ML injection 2 mg ( Intravenous MAR Unhold 01/07/19 1655)  insulin aspart (novoLOG) injection 0-15 Units (0 Units Subcutaneous Not Given 01/07/19 1719)  insulin aspart (novoLOG) injection 0-5 Units (0 Units Subcutaneous Not Given 01/07/19 2125)  oxybutynin (DITROPAN) tablet 5 mg (has no administration in time range)  brimonidine (ALPHAGAN) 0.2 % ophthalmic solution 1 drop (1 drop Both Eyes Given 01/07/19 1840)    And  timolol (TIMOPTIC) 0.5 % ophthalmic solution 1 drop (1 drop Both Eyes  Given 01/07/19 1838)  latanoprost (XALATAN) 0.005 % ophthalmic solution 1 drop (1 drop Left Eye Given 01/07/19 2143)  morphine 2 MG/ML injection 2 mg (2 mg Intravenous Given 01/07/19 0614)  ondansetron (ZOFRAN) injection 4 mg (4 mg Intravenous Given 01/07/19 0613)  morphine 4 MG/ML injection 4 mg (4 mg Intravenous Given 01/07/19 0657)  morphine 2 MG/ML injection 2 mg (2 mg Intravenous Given 01/07/19 0728)  iohexol (OMNIPAQUE) 240 MG/ML injection 50 mL (50 mLs Oral Contrast Given 01/07/19 0729)  ondansetron (ZOFRAN) injection 4 mg ( Intravenous Not Given 01/07/19 1720)  iohexol (OMNIPAQUE) 300 MG/ML solution 100 mL (100 mLs Intravenous Contrast Given 01/07/19 0841)  potassium chloride SA (K-DUR,KLOR-CON) CR tablet 40 mEq (40 mEq Oral Given 01/07/19 1758)     ED Discharge Orders    None       Note:  This document was prepared using Dragon voice recognition software and may include unintentional dictation errors.   Darci Current, MD 01/07/19 [phone]

## 2019-01-07 NOTE — ED Notes (Signed)
Pt transported to OR at this time  

## 2019-01-07 NOTE — Anesthesia Procedure Notes (Addendum)
Procedure Name: Intubation Date/Time: 01/07/2019 3:22 PM Performed by: Allean Found, CRNA Pre-anesthesia Checklist: Patient identified, Patient being monitored, Timeout performed, Emergency Drugs available and Suction available Patient Re-evaluated:Patient Re-evaluated prior to induction Oxygen Delivery Method: Circle system utilized Preoxygenation: Pre-oxygenation with 100% oxygen Induction Type: IV induction Ventilation: Mask ventilation without difficulty Laryngoscope Size: Mac and 3 Grade View: Grade I Tube type: Oral Tube size: 7.0 mm Number of attempts: 1 Airway Equipment and Method: Stylet Placement Confirmation: ETT inserted through vocal cords under direct vision,  positive ETCO2 and breath sounds checked- equal and bilateral Secured at: 21 cm Tube secured with: Tape Dental Injury: Teeth and Oropharynx as per pre-operative assessment

## 2019-01-07 NOTE — ED Provider Notes (Signed)
Patient CT demonstrates 2 stones on the right which is likely the cause of her pain.  Urinalysis demonstrates positive nitrites and rare bacteria.  Discussed with Dr. Lonna Cobb who recommends admission for IV antibiotics and possible stent placement, he will see the patient.  Patient agrees with this plan, her pain is improved after morphine.  Will discuss with hospitalist   Jene Every, MD 01/07/19 1300

## 2019-01-07 NOTE — ED Notes (Signed)
Returned from ultrasound.

## 2019-01-07 NOTE — Op Note (Signed)
Preoperative diagnosis:  1. Right ureteral calculi with obstruction 2. Renal colic, severe  Postoperative diagnosis:  1. Right proximal ureteral calculus with high-grade obstruction 2. Right mid ureteral calculus 3. Right renal colic, severe 4. Possible UTI  Procedure:  1. Cystoscopy 2. Right ureteral stent placement (6FR) 24 cm  Surgeon: Lorin Picket C. Stoioff, M.D.  Anesthesia: General  Complications: None  Intraoperative findings:  1.  Persistent right hydronephrosis/hydroureter to the proximal ureter 6 hours post IV contrast.  No contrast seen distal to this point  2.  No evidence of forniceal rupture   EBL: Minimal  Specimens: Urine right renal pelvis for culture  Indication: Cynthia Lucas is a 68 y.o. female presenting to the ED with severe right renal colic.  CT with contrast remarkable for moderate to severe right hydronephrosis and proximal hydroureter with a 4 mm obstructing proximal ureteral calculus and a 4 mm mid ureteral calculus.  Urinalysis was nitrite positive without significant pyuria.  After reviewing the management options for treatment, he elected to proceed with the above surgical procedure(s). We have discussed the potential benefits and risks of the procedure, side effects of the proposed treatment, the likelihood of the patient achieving the goals of the procedure, and any potential problems that might occur during the procedure or recuperation. Informed consent has been obtained.  Description of procedure:  The patient was taken to the operating room and general anesthesia was induced.  The patient was placed in the dorsal lithotomy position, prepped and draped in the usual sterile fashion, and preoperative antibiotics were administered. A preoperative time-out was performed.   A 21 French cystoscope was lubricated and passed per urethra.  Panendoscopy was performed and the bladder mucosa showed scattered areas of cystitis cystica.  There was crystalline  material present in the bladder.  No mucosal erythema, solid or papillary lesions.  The ureter orifice ease were normal appearing bilaterally.  Clear efflux was noted from the left ureter orifice and no efflux from the right.  Attention then turned to the right ureteral orifice and a 0.38 sensor guidewire was then advanced up the  ureter under fluoroscopic guidance.  Persistent contrast was noted with moderate to severe hydronephrosis and proximal hydroureter without contrast seen distally.  Slight resistance was encountered in the mid ureter due to the distally located calculus however the wire was able to be advanced passed the stone.  In the area of the proximal ureter the guidewire was unable to be advanced into the renal pelvis.  A 5 French open-ended ureteral catheter was placed over the wire to the proximal ureter and the guidewire was removed.  A 0.038 Glidewire was placed through the ureteral catheter.  Tortuosity was encountered in the proximal ureter and the Glidewire easily passed into the renal pelvis.  The ureteral catheter was advanced over the Glidewire and the Glidewire was removed.  Urine was aspirated from the renal pelvis and was brownish and murky in appearance.  This urine was sent for culture.  Contrast was injected through the ureteral catheter to confirm correct position.  The sensor wire was replaced and the ureteral catheter was removed.  A 6 French/24 cm double-J ureteral stent was placed over the wire without difficulty.  A good curl was seen in the renal pelvis under fluoroscopy and the distal end stent was well-positioned in the bladder under direct vision.  The bladder was emptied and the cystoscope was removed.  After anesthetic reversal patient was transported to the PACU in stable condition.  John Giovanni, MD

## 2019-01-07 NOTE — ED Notes (Signed)
Pt o2 sats in the 70s , pt placed on 3L , 100%. MD aware . Pt in NAD

## 2019-01-07 NOTE — ED Notes (Signed)
ED TO INPATIENT HANDOFF REPORT  ED Nurse Name and Phone #: [place] [phone]  S Name/Age/Gender Cynthia Lucas 68 y.o. female Room/Bed: ED24A/ED24A  Code Status   Code Status: Not on file  Home/SNF/Other Home Patient oriented to: self, place, time and situation Is this baseline? Yes   Triage Complete: Triage complete  Chief Complaint [facility] RLQ Pain  Triage Note EMS pt to room 24 from home with report of pain to right lower abd radiating into her back. Woke her from sleep this morning. EMS report bp 180/96 pulse 60 temp 98.4. FS 137   Allergies No Known Allergies  Level of Care/Admitting Diagnosis ED Disposition    ED Disposition Condition Comment   Admit  Hospital Area: [facility] [[id number]]  Level of Care: Med-Surg [16]  Diagnosis: Obstructive uropathy [301303]  Admitting Physician: Ihor Austin [[id number]]  Attending Physician: Ihor Austin [[id number]]  Estimated length of stay: past midnight tomorrow  Certification:: I certify this patient will need inpatient services for at least 2 midnights  PT Class (Do Not Modify): Inpatient [101]  PT Acc Code (Do Not Modify): Private [1]       B Medical/Surgery History Past Medical History:  Diagnosis Date  . Cough    CHRONIC  . Diabetes mellitus without complication (HCC)   . GERD (gastroesophageal reflux disease)   . Hypertension   . Sleep apnea   . Tremor    HEAD   Past Surgical History:  Procedure Laterality Date  . ABDOMINAL HYSTERECTOMY    . CATARACT EXTRACTION W/PHACO Left 03/07/2018   Procedure: CATARACT EXTRACTION PHACO AND INTRAOCULAR LENS PLACEMENT (IOC);  Surgeon: Galen Manila, MD;  Location: [facility] ORS;  Service: Ophthalmology;  Laterality: Left;  Lot # [id number] H [place]: 04:19.8 AP%: 19.9 CDE: 51.70  . CESAREAN SECTION    . COLONOSCOPY    . JOINT REPLACEMENT       A IV Location/Drains/Wounds Patient Lines/Drains/Airways Status   Active Line/Drains/Airways    Name:   Placement  date:   Placement time:   Site:   Days:   Peripheral IV 01/07/19 Left Antecubital   01/07/19    0554    Antecubital   less than 1   Incision (Closed) 03/07/18 Eye Left   03/07/18    0850     306          Intake/Output Last 24 hours No intake or output data in the 24 hours ending 01/07/19 1411  Labs/Imaging Results for orders placed or performed during the hospital encounter of 01/07/19 (from the past 48 hour(s))  CBC with Differential     Status: None   Collection Time: 01/07/19  5:58 AM  Result Value Ref Range   WBC 9.5 4.0 - 10.5 K/uL   RBC 4.95 3.87 - 5.11 MIL/uL   Hemoglobin 13.1 12.0 - 15.0 g/dL   HCT 34.1 93.7 - 90.2 %   MCV 81.2 80.0 - 100.0 fL   MCH 26.5 26.0 - 34.0 pg   MCHC 32.6 30.0 - 36.0 g/dL   RDW 40.9 73.5 - 32.9 %   Platelets 291 150 - 400 K/uL   nRBC 0.0 0.0 - 0.2 %   Neutrophils Relative % 66 %   Neutro Abs 6.3 1.7 - 7.7 K/uL   Lymphocytes Relative 27 %   Lymphs Abs 2.5 0.7 - 4.0 K/uL   Monocytes Relative 5 %   Monocytes Absolute 0.5 0.1 - 1.0 K/uL   Eosinophils Relative 2 %   Eosinophils Absolute  0.2 0.0 - 0.5 K/uL   Basophils Relative 0 %   Basophils Absolute 0.0 0.0 - 0.1 K/uL   Immature Granulocytes 0 %   Abs Immature Granulocytes 0.03 0.00 - 0.07 K/uL    Comment: Performed at [facility], [address] [place], [place] [address]  Comprehensive metabolic panel     Status: Abnormal   Collection Time: 01/07/19  5:58 AM  Result Value Ref Range   Sodium 142 135 - 145 mmol/L   Potassium 3.2 (L) 3.5 - 5.1 mmol/L   Chloride 103 98 - 111 mmol/L   CO2 28 22 - 32 mmol/L   Glucose, Bld 165 (H) 70 - 99 mg/dL   BUN 18 8 - 23 mg/dL   Creatinine, Ser 0.10 (H) 0.44 - 1.00 mg/dL   Calcium 8.7 (L) 8.9 - 10.3 mg/dL   Total Protein 7.4 6.5 - 8.1 g/dL   Albumin 4.1 3.5 - 5.0 g/dL   AST 19 15 - 41 U/L   ALT 20 0 - 44 U/L   Alkaline Phosphatase 89 38 - 126 U/L   Total Bilirubin 0.6 0.3 - 1.2 mg/dL   GFR calc non Af Amer 54 (L) >60 mL/min   GFR calc  Af Amer >60 >60 mL/min   Anion gap 11 5 - 15    Comment: Performed at [facility], [address] [place], [place] [address]  Lipase, blood     Status: None   Collection Time: 01/07/19  5:58 AM  Result Value Ref Range   Lipase 44 11 - 51 U/L    Comment: Performed at [facility], [address] [place], [place] [address]  Urinalysis, Complete w Microscopic     Status: Abnormal   Collection Time: 01/07/19 11:26 AM  Result Value Ref Range   Color, Urine YELLOW (A) YELLOW   APPearance CLEAR (A) CLEAR   Specific Gravity, Urine >1.046 (H) 1.005 - 1.030   pH 5.0 5.0 - 8.0   Glucose, UA NEGATIVE NEGATIVE mg/dL   Hgb urine dipstick SMALL (A) NEGATIVE   Bilirubin Urine NEGATIVE NEGATIVE   Ketones, ur NEGATIVE NEGATIVE mg/dL   Protein, ur NEGATIVE NEGATIVE mg/dL   Nitrite POSITIVE (A) NEGATIVE   Leukocytes,Ua NEGATIVE NEGATIVE   RBC / HPF 0-5 0 - 5 RBC/hpf   WBC, UA 0-5 0 - 5 WBC/hpf   Bacteria, UA RARE (A) NONE SEEN   Squamous Epithelial / LPF 0-5 0 - 5   Mucus PRESENT     Comment: Performed at [facility], [address] [place], [place] [address]   Ct Abdomen Pelvis W Contrast  Result Date: 01/07/2019 CLINICAL DATA:  Right-sided abdominal pain. Hydronephrosis on ultrasound of the abdomen EXAM: CT ABDOMEN AND PELVIS WITH CONTRAST TECHNIQUE: Multidetector CT imaging of the abdomen and pelvis was performed using the standard protocol following bolus administration of intravenous contrast. CONTRAST:  OMNIPAQUE IOHEXOL 300 MG/ML  SOLN COMPARISON:  Right upper quadrant ultrasound from earlier today FINDINGS: Lower chest: Coronary atherosclerosis. Mild scarring in the right lower lobe. Hepatobiliary: No focal liver abnormality.Numerous gallstones by ultrasound are not apparent by CT. Pancreas: Benign-appearing coarse calcification. Spleen: Unremarkable. Adrenals/Urinary Tract: Negative adrenals. Right hydronephrosis with delayed renal expansion and extensive  perinephric edema. There is right hydroureter to the level of a 4 mm stone at the iliac crossing. 4 mm stone is also seen at the right UPJ. No left-sided hydronephrosis or ureteral calculus. Unremarkable bladder. Stomach/Bowel: No obstruction. No appendicitis. Mild colonic  diverticulosis. Vascular/Lymphatic: No acute vascular abnormality. No mass or adenopathy. Reproductive:Hysterectomy. Other: No ascites or pneumoperitoneum. Musculoskeletal: No acute abnormalities. Spondylosis with multilevel thoracic ankylosis. Lower lumbar degenerative facet spurring with L4-5 and L5-S1 ankylosis. IMPRESSION: 1. Two obstructing right renal calculi, both measuring 4 mm. These are located at the UPJ and iliac crossing. 2. Cholelithiasis by ultrasound is largely occult by CT. No evidence of cholecystitis. 3. Coronary atherosclerosis. Electronically Signed   By: Marnee Spring M.D.   On: 01/07/2019 08:58   US Abdomen Limited Ruq  Result Date: 01/07/2019 CLINICAL DATA:  Right upper quadrant pain EXAM: ULTRASOUND ABDOMEN LIMITED RIGHT UPPER QUADRANT COMPARISON:  None. FINDINGS: Gallbladder: Numerous gallstones filling the gallbladder with wall echo shadow sign. Where visible the wall of the gallbladder is not thickened. No visible pericholecystic edema. There is focal tenderness per report. Common bile duct: Diameter: 7 mm.  Where visualized, no filling defect. Liver: No focal lesion identified. Within normal limits in parenchymal echogenicity. Portal vein is patent on color Doppler imaging with normal direction of blood flow towards the liver. Other: Incidental right moderate hydronephrosis. IMPRESSION: 1. Gallstones fill the gallbladder. The gallbladder is focally tender but there is no visible wall thickening or pericholecystic edema typical of acute cholecystitis. The degree of shadowing calculi limits assessment of the gallbladder. 2. Borderline common bile duct dilatation, please correlate with biliary labs. 3. Right  hydronephrosis. Electronically Signed   By: Marnee Spring M.D.   On: 01/07/2019 07:11    Pending Labs Unresulted Labs (From admission, onward)    Start     Ordered   01/07/19 1246  Urine Culture  Add-on,   AD     01/07/19 1245   Signed and Held  HIV antibody (Routine Testing)  Once,   R     Signed and Held   Signed and Held  CBC  (enoxaparin (LOVENOX)    CrCl >/= 30 ml/min)  Once,   R    Comments:  Baseline for enoxaparin therapy IF NOT ALREADY DRAWN.  Notify MD if PLT < 100 K.    Signed and Held   Signed and Held  Creatinine, serum  (enoxaparin (LOVENOX)    CrCl >/= 30 ml/min)  Once,   R    Comments:  Baseline for enoxaparin therapy IF NOT ALREADY DRAWN.    Signed and Held   Signed and Held  Creatinine, serum  (enoxaparin (LOVENOX)    CrCl >/= 30 ml/min)  Weekly,   R    Comments:  while on enoxaparin therapy    Signed and Held   Signed and Held  Basic metabolic panel  Tomorrow morning,   R     Signed and Held   Signed and Held  CBC  Tomorrow morning,   R     Signed and Held          Vitals/Pain Today's Vitals   01/07/19 1100 01/07/19 1200 01/07/19 1215 01/07/19 1330  BP:    (!) 153/73  Pulse: (!) 59 60  60  Resp: Temp:      TempSrc:      SpO2: 100% 99%  97%  Weight:      Height:      PainSc:   1      Isolation Precautions No active isolations  Medications Medications  potassium chloride SA (K-DUR,KLOR-CON) CR tablet 40 mEq (has no administration in time range)  cefTRIAXone (ROCEPHIN) 1 g in sodium chloride 0.9 % 100 mL  IVPB (has no administration in time range)  morphine 2 MG/ML injection 2 mg (has no administration in time range)  morphine 2 MG/ML injection 2 mg (2 mg Intravenous Given 01/07/19 0614)  ondansetron (ZOFRAN) injection 4 mg (4 mg Intravenous Given 01/07/19 0613)  morphine 4 MG/ML injection 4 mg (4 mg Intravenous Given 01/07/19 0657)  morphine 2 MG/ML injection 2 mg (2 mg Intravenous Given 01/07/19 0728)  iohexol (OMNIPAQUE) 240 MG/ML  injection 50 mL (50 mLs Oral Contrast Given 01/07/19 0729)  ondansetron (ZOFRAN) injection 4 mg (4 mg Intravenous Given 01/07/19 0830)  iohexol (OMNIPAQUE) 300 MG/ML solution 100 mL (100 mLs Intravenous Contrast Given 01/07/19 0841)    Mobility walks Low fall risk   Focused Assessments    R Recommendations: See Admitting Provider Note  Report given to:   Additional Notes:

## 2019-01-07 NOTE — Anesthesia Preprocedure Evaluation (Signed)
Anesthesia Evaluation  Patient identified by MRN, date of birth, ID band Patient awake    Reviewed: Allergy & Precautions, H&P , NPO status , reviewed documented beta blocker date and time   History of Anesthesia Complications Negative for: history of anesthetic complications  Airway Mallampati: III  TM Distance: >3 FB Neck ROM: full    Dental  (+) Teeth Intact, Dental Advidsory Given   Pulmonary neg shortness of breath, sleep apnea , neg COPD, neg recent URI,           Cardiovascular Exercise Tolerance: Good hypertension, (-) angina(-) CAD, (-) Past MI, (-) Cardiac Stents and (-) CABG (-) dysrhythmias (-) Valvular Problems/Murmurs     Neuro/Psych negative neurological ROS     GI/Hepatic Neg liver ROS, GERD  Medicated and Controlled,  Endo/Other  diabetes  Renal/GU Renal disease (kidney stone)     Musculoskeletal   Abdominal   Peds  Hematology   Anesthesia Other Findings Past Medical History: No date: Cough     Comment:  CHRONIC No date: Diabetes mellitus without complication (HCC) No date: GERD (gastroesophageal reflux disease) No date: Hypertension No date: Sleep apnea No date: Tremor     Comment:  HEAD  Past Surgical History: No date: ABDOMINAL HYSTERECTOMY No date: CESAREAN SECTION No date: COLONOSCOPY No date: JOINT REPLACEMENT  BMI    Body Mass Index:  33.09 kg/m    Head tremor discussed w surgical team, will attempt to reduce w sedation  Reproductive/Obstetrics negative OB ROS                             Anesthesia Physical  Anesthesia Plan  ASA: III  Anesthesia Plan: General   Post-op Pain Management:    Induction: Intravenous, Rapid sequence and Cricoid pressure planned  PONV Risk Score and Plan: Ondansetron, Dexamethasone, Midazolam, Promethazine and Treatment may vary due to age or medical condition  Airway Management Planned: Oral ETT  Additional  Equipment:   Intra-op Plan:   Post-operative Plan: Extubation in OR  Informed Consent: I have reviewed the patients History and Physical, chart, labs and discussed the procedure including the risks, benefits and alternatives for the proposed anesthesia with the patient or authorized representative who has indicated his/her understanding and acceptance.     Dental Advisory Given  Plan Discussed with: CRNA  Anesthesia Plan Comments:         Anesthesia Quick Evaluation

## 2019-01-07 NOTE — H&P (Signed)
[facility] [place] [facility]   PATIENT NAME: Cynthia Lucas    MR#:  [id number]  DATE OF BIRTH:  09/09/1951  DATE OF ADMISSION:  01/07/2019  PRIMARY CARE PHYSICIAN: Toy Cookey, FNP   REQUESTING/REFERRING PHYSICIAN:   CHIEF COMPLAINT:   Chief Complaint  Patient presents with  . Abdominal Pain    HISTORY OF PRESENT ILLNESS: Cynthia Lucas  is a 68 y.o. female with a known history of type 2 diabetes mellitus GERD, hypertension, sleep apnea presented to the emergency room with right flank pain, nausea and vomiting.  Flank pain has been going on for the last 2 days it is aching in nature 7 out of 10 on a scale of 1-10.  No complaints of any shortness of breath, sore throat.  No recent travel or sick contacts.  Patient was worked up with CT abdomen which showed 2 obstructing right renal calculi and cholelithiasis.  No evidence of cholecystitis.  Urology service has been consulted by ER physician.  PAST MEDICAL HISTORY:   Past Medical History:  Diagnosis Date  . Cough    CHRONIC  . Diabetes mellitus without complication (HCC)   . GERD (gastroesophageal reflux disease)   . Hypertension   . Sleep apnea   . Tremor    HEAD    PAST SURGICAL HISTORY:  Past Surgical History:  Procedure Laterality Date  . ABDOMINAL HYSTERECTOMY    . CATARACT EXTRACTION W/PHACO Left 03/07/2018   Procedure: CATARACT EXTRACTION PHACO AND INTRAOCULAR LENS PLACEMENT (IOC);  Surgeon: Galen Manila, MD;  Location: [facility] ORS;  Service: Ophthalmology;  Laterality: Left;  Lot # [id number] H [place]: 04:19.8 AP%: 19.9 CDE: 51.70  . CESAREAN SECTION    . COLONOSCOPY    . JOINT REPLACEMENT      SOCIAL HISTORY:  Social History   Tobacco Use  . Smoking status: Never Smoker  Substance Use Topics  . Alcohol use: Yes    Comment: OCCAS    FAMILY HISTORY:  Family History  Problem Relation Age of Onset  . Heart disease Mother     DRUG ALLERGIES: No Known Allergies  REVIEW OF  SYSTEMS:   CONSTITUTIONAL: No fever, fatigue or weakness.  EYES: No blurred or double vision.  EARS, NOSE, AND THROAT: No tinnitus or ear pain.  RESPIRATORY: No cough, shortness of breath, wheezing or hemoptysis.  CARDIOVASCULAR: No chest pain, orthopnea, edema.  GASTROINTESTINAL: Has nausea, vomiting, diarrhea  has abdominal pain.  GENITOURINARY: Has dysuria,no hematuria.  Has flank pain. ENDOCRINE: No polyuria, nocturia,  HEMATOLOGY: No anemia, easy bruising or bleeding SKIN: No rash or lesion. MUSCULOSKELETAL: No joint pain or arthritis.   NEUROLOGIC: No tingling, numbness, weakness.  PSYCHIATRY: No anxiety or depression.   MEDICATIONS AT HOME:  Prior to Admission medications   Medication Sig Start Date End Date Taking? Authorizing Provider  atorvastatin (LIPITOR) 40 MG tablet Take 40 mg by mouth daily.   Yes [provider]  bimatoprost (LUMIGAN) 0.03 % ophthalmic solution Place 1 drop into the left eye at bedtime.   Yes [provider]  Brimonidine Tartrate-Timolol (COMBIGAN OP) Apply 1 drop to eye once. To each eye   Yes [provider]  hydrochlorothiazide (HYDRODIURIL) 25 MG tablet Take 25 mg by mouth daily.   Yes [provider]  metFORMIN (GLUCOPHAGE) 500 MG tablet Take 500 mg by mouth once.   Yes [provider]  ranitidine (ZANTAC) 150 MG tablet Take 150 mg by mouth 2 (two) times daily.  Yes [provider]      PHYSICAL EXAMINATION:   VITAL SIGNS: Blood pressure (!) 153/73, pulse 60, temperature 98.6 F (37 C), temperature source Oral, resp. rate 12, height 5\' 7"  (1.702 m), weight 90.7 kg, SpO2 97 %.  GENERAL:  68 y.o.-year-old patient lying in the bed with no acute distress.  EYES: Pupils equal, round, reactive to light and accommodation. No scleral icterus. Extraocular muscles intact.  HEENT: Head atraumatic, normocephalic. Oropharynx and nasopharynx clear.  NECK:  Supple, no jugular venous distention. No  thyroid enlargement, no tenderness.  LUNGS: Normal breath sounds bilaterally, no wheezing, rales,rhonchi or crepitation. No use of accessory muscles of respiration.  CARDIOVASCULAR: S1, S2 normal. No murmurs, rubs, or gallops.  ABDOMEN: Soft,  nondistended. Bowel sounds present. No organomegaly or mass.  Tenderness present in the right flank EXTREMITIES: No pedal edema, cyanosis, or clubbing.  NEUROLOGIC: Cranial nerves II through XII are intact. Muscle strength 5/5 in all extremities. Sensation intact. Gait not checked.  PSYCHIATRIC: The patient is alert and oriented x 3.  SKIN: No obvious rash, lesion, or ulcer.   LABORATORY PANEL:   CBC Recent Labs  Lab 01/07/19 0558  WBC 9.5  HGB 13.1  HCT 40.2  PLT 291  MCV 81.2  MCH 26.5  MCHC 32.6  RDW 14.0  LYMPHSABS 2.5  MONOABS 0.5  EOSABS 0.2  BASOSABS 0.0   ------------------------------------------------------------------------------------------------------------------  Chemistries  Recent Labs  Lab 01/07/19 0558  NA 142  K 3.2*  CL 103  CO2 28  GLUCOSE 165*  BUN 18  CREATININE 1.06*  CALCIUM 8.7*  AST 19  ALT 20  ALKPHOS 89  BILITOT 0.6   ------------------------------------------------------------------------------------------------------------------ estimated creatinine clearance is 59.5 mL/min (A) (by C-G formula based on SCr of 1.06 mg/dL (H)). ------------------------------------------------------------------------------------------------------------------ No results for input(s): TSH, T4TOTAL, T3FREE, THYROIDAB in the last 72 hours.  Invalid input(s): FREET3   Coagulation profile No results for input(s): INR, PROTIME in the last 168 hours. ------------------------------------------------------------------------------------------------------------------- No results for input(s): DDIMER in the last 72  hours. -------------------------------------------------------------------------------------------------------------------  Cardiac Enzymes No results for input(s): CKMB, TROPONINI, MYOGLOBIN in the last 168 hours.  Invalid input(s): CK ------------------------------------------------------------------------------------------------------------------ Invalid input(s): POCBNP  ---------------------------------------------------------------------------------------------------------------  Urinalysis    Component Value Date/Time   COLORURINE YELLOW (A) 01/07/2019 1126   APPEARANCEUR CLEAR (A) 01/07/2019 1126   LABSPEC >1.046 (H) 01/07/2019 1126   PHURINE 5.0 01/07/2019 1126   GLUCOSEU NEGATIVE 01/07/2019 1126   HGBUR SMALL (A) 01/07/2019 1126   BILIRUBINUR NEGATIVE 01/07/2019 1126   KETONESUR NEGATIVE 01/07/2019 1126   PROTEINUR NEGATIVE 01/07/2019 1126   NITRITE POSITIVE (A) 01/07/2019 1126   LEUKOCYTESUR NEGATIVE 01/07/2019 1126     RADIOLOGY: Ct Abdomen Pelvis W Contrast  Result Date: 01/07/2019 CLINICAL DATA:  Right-sided abdominal pain. Hydronephrosis on ultrasound of the abdomen EXAM: CT ABDOMEN AND PELVIS WITH CONTRAST TECHNIQUE: Multidetector CT imaging of the abdomen and pelvis was performed using the standard protocol following bolus administration of intravenous contrast. CONTRAST:  OMNIPAQUE IOHEXOL 300 MG/ML  SOLN COMPARISON:  Right upper quadrant ultrasound from earlier today FINDINGS: Lower chest: Coronary atherosclerosis. Mild scarring in the right lower lobe. Hepatobiliary: No focal liver abnormality.Numerous gallstones by ultrasound are not apparent by CT. Pancreas: Benign-appearing coarse calcification. Spleen: Unremarkable. Adrenals/Urinary Tract: Negative adrenals. Right hydronephrosis with delayed renal expansion and extensive perinephric edema. There is right hydroureter to the level of a 4 mm stone at the iliac crossing. 4 mm stone is also seen at the right  UPJ. No  left-sided hydronephrosis or ureteral calculus. Unremarkable bladder. Stomach/Bowel: No obstruction. No appendicitis. Mild colonic diverticulosis. Vascular/Lymphatic: No acute vascular abnormality. No mass or adenopathy. Reproductive:Hysterectomy. Other: No ascites or pneumoperitoneum. Musculoskeletal: No acute abnormalities. Spondylosis with multilevel thoracic ankylosis. Lower lumbar degenerative facet spurring with L4-5 and L5-S1 ankylosis. IMPRESSION: 1. Two obstructing right renal calculi, both measuring 4 mm. These are located at the UPJ and iliac crossing. 2. Cholelithiasis by ultrasound is largely occult by CT. No evidence of cholecystitis. 3. Coronary atherosclerosis. Electronically Signed   By: Marnee Spring M.D.   On: 01/07/2019 08:58   US Abdomen Limited Ruq  Result Date: 01/07/2019 CLINICAL DATA:  Right upper quadrant pain EXAM: ULTRASOUND ABDOMEN LIMITED RIGHT UPPER QUADRANT COMPARISON:  None. FINDINGS: Gallbladder: Numerous gallstones filling the gallbladder with wall echo shadow sign. Where visible the wall of the gallbladder is not thickened. No visible pericholecystic edema. There is focal tenderness per report. Common bile duct: Diameter: 7 mm.  Where visualized, no filling defect. Liver: No focal lesion identified. Within normal limits in parenchymal echogenicity. Portal vein is patent on color Doppler imaging with normal direction of blood flow towards the liver. Other: Incidental right moderate hydronephrosis. IMPRESSION: 1. Gallstones fill the gallbladder. The gallbladder is focally tender but there is no visible wall thickening or pericholecystic edema typical of acute cholecystitis. The degree of shadowing calculi limits assessment of the gallbladder. 2. Borderline common bile duct dilatation, please correlate with biliary labs. 3. Right hydronephrosis. Electronically Signed   By: Marnee Spring M.D.   On: 01/07/2019 07:11    EKG: Orders placed or performed during the  hospital encounter of 01/07/19  . EKG 12-Lead  . EKG 12-Lead  . EKG 12-Lead  . EKG 12-Lead    IMPRESSION AND PLAN: 68 year old female patient with a known history of type 2 diabetes mellitus GERD, hypertension, sleep apnea presented to the emergency room with right flank pain, nausea and vomiting.   -Acute obstructive uropathy Urology consult IV fluids and antibiotics N.p.o. for now  -Acute pyelonephritis with renal stone Start patient on IV Rocephin antibiotic Follow-up cultures IV fluids  -Type 2 diabetes mellitus Diabetic diet with sliding scale coverage with insulin  -DVT prophylaxis subcu Lovenox daily  All the records are reviewed and case discussed with ED provider. Management plans discussed with the patient, family and they are in agreement.  CODE STATUS:Full code   TOTAL TIME TAKING CARE OF THIS PATIENT: 52 minutes.    Ihor Austin M.D on 01/07/2019 at 2:11 PM  Between 7am to 6pm - Pager - [phone]  After 6pm go to [email or web address] - password [email or web address] [facility]  [facility]  Office  [phone]  CC: Primary care physician; Toy Cookey, FNP

## 2019-01-07 NOTE — Consult Note (Signed)
Urology Consult  Chief Complaint: Flank pain  History of Present Illness: Cynthia Lucas is a 68 y.o. year old female who [facility] morning with a 2-hour history of severe right flank pain.  Severity was rated 10/10.  She had nausea, vomiting but denied fever or chills.  There were no identifiable precipitating, aggravating or alleviating factors.  A stone protocol CT of the abdomen pelvis was performed which showed moderate to severe right hydronephrosis and hydroureter.  There were 2-4 mm calculi in the upper and mid ureter.  There was perinephric fluid suspicious for forniceal rupture.  Urinalysis was nitrite positive but no significant WBCs.  Risk factors include diabetes.  Denies previous history of urologic problems or prior history of stone disease.   Past Medical History:  Diagnosis Date   Cough    CHRONIC   Diabetes mellitus without complication (HCC)    GERD (gastroesophageal reflux disease)    Hypertension    Sleep apnea    Tremor    HEAD    Past Surgical History:  Procedure Laterality Date   ABDOMINAL HYSTERECTOMY     CATARACT EXTRACTION W/PHACO Left 03/07/2018   Procedure: CATARACT EXTRACTION PHACO AND INTRAOCULAR LENS PLACEMENT (IOC);  Surgeon: Galen ManilaPorfilio, William, MD;  Location: [facility] ORS;  Service: Ophthalmology;  Laterality: Left;  Lot # [id number] H [place]: 04:19.8 AP%: 19.9 CDE: 51.70   CESAREAN SECTION     COLONOSCOPY     JOINT REPLACEMENT      Home Medications:  Current Meds  Medication Sig   atorvastatin (LIPITOR) 40 MG tablet Take 40 mg by mouth daily.   bimatoprost (LUMIGAN) 0.03 % ophthalmic solution Place 1 drop into the left eye at bedtime.   Brimonidine Tartrate-Timolol (COMBIGAN OP) Apply 1 drop to eye once. To each eye   hydrochlorothiazide (HYDRODIURIL) 25 MG tablet Take 25 mg by mouth daily.   metFORMIN (GLUCOPHAGE) 500 MG tablet Take 500 mg by mouth once.   ranitidine (ZANTAC) 150 MG tablet Take 150 mg by mouth 2  (two) times daily.    Allergies: No Known Allergies  Family History  Problem Relation Age of Onset   Heart disease Mother     Social History:  reports that she has never smoked. She does not have any smokeless tobacco history on file. She reports current alcohol use. No history on file for drug.  ROS: A complete review of systems was performed.  All systems are negative except for pertinent findings as noted.  Physical Exam:  Vital signs in last 24 hours: Temp:  [98.6 F (37 C)] 98.6 F (37 C) (04/06 0603) Pulse Rate:  [57-64] 60 (04/06 1330) Resp:  [10-21] 12 (04/06 1330) BP: (135-153)/(72-133) 153/73 (04/06 1330) SpO2:  [95 %-100 %] 97 % (04/06 1330) Weight:  [90.7 kg] 90.7 kg (04/06 0555) Constitutional:  Alert and oriented, No acute distress HEENT: Startup AT, moist mucus membranes.  Trachea midline, no masses Cardiovascular: Regular rate and rhythm, no clubbing, cyanosis, or edema. Respiratory: Normal respiratory effort, lungs clear bilaterally GI: Abdomen is soft, nontender, nondistended, no abdominal masses GU: Right CVA tenderness Skin: No rashes, bruises or suspicious lesions Lymph: No cervical or inguinal adenopathy Neurologic: Grossly intact, no focal deficits, moving all 4 extremities Psychiatric: Normal mood and affect   Laboratory Data:  Recent Labs    01/07/19 0558  WBC 9.5  HGB 13.1  HCT 40.2   Recent Labs    01/07/19 0558  NA 142  K 3.2*  CL 103  CO2 28  GLUCOSE 165*  BUN 18  CREATININE 1.06*  CALCIUM 8.7*    Radiologic Imaging: CT images were personally reviewed  Ct Abdomen Pelvis W Contrast  Result Date: 01/07/2019 CLINICAL DATA:  Right-sided abdominal pain. Hydronephrosis on ultrasound of the abdomen EXAM: CT ABDOMEN AND PELVIS WITH CONTRAST TECHNIQUE: Multidetector CT imaging of the abdomen and pelvis was performed using the standard protocol following bolus administration of intravenous contrast. CONTRAST:  OMNIPAQUE IOHEXOL 300  MG/ML  SOLN COMPARISON:  Right upper quadrant ultrasound from earlier today FINDINGS: Lower chest: Coronary atherosclerosis. Mild scarring in the right lower lobe. Hepatobiliary: No focal liver abnormality.Numerous gallstones by ultrasound are not apparent by CT. Pancreas: Benign-appearing coarse calcification. Spleen: Unremarkable. Adrenals/Urinary Tract: Negative adrenals. Right hydronephrosis with delayed renal expansion and extensive perinephric edema. There is right hydroureter to the level of a 4 mm stone at the iliac crossing. 4 mm stone is also seen at the right UPJ. No left-sided hydronephrosis or ureteral calculus. Unremarkable bladder. Stomach/Bowel: No obstruction. No appendicitis. Mild colonic diverticulosis. Vascular/Lymphatic: No acute vascular abnormality. No mass or adenopathy. Reproductive:Hysterectomy. Other: No ascites or pneumoperitoneum. Musculoskeletal: No acute abnormalities. Spondylosis with multilevel thoracic ankylosis. Lower lumbar degenerative facet spurring with L4-5 and L5-S1 ankylosis. IMPRESSION: 1. Two obstructing right renal calculi, both measuring 4 mm. These are located at the UPJ and iliac crossing. 2. Cholelithiasis by ultrasound is largely occult by CT. No evidence of cholecystitis. 3. Coronary atherosclerosis. Electronically Signed   By: Marnee Spring M.D.   On: 01/07/2019 08:58   US Abdomen Limited Ruq  Result Date: 01/07/2019 CLINICAL DATA:  Right upper quadrant pain EXAM: ULTRASOUND ABDOMEN LIMITED RIGHT UPPER QUADRANT COMPARISON:  None. FINDINGS: Gallbladder: Numerous gallstones filling the gallbladder with wall echo shadow sign. Where visible the wall of the gallbladder is not thickened. No visible pericholecystic edema. There is focal tenderness per report. Common bile duct: Diameter: 7 mm.  Where visualized, no filling defect. Liver: No focal lesion identified. Within normal limits in parenchymal echogenicity. Portal vein is patent on color Doppler imaging with  normal direction of blood flow towards the liver. Other: Incidental right moderate hydronephrosis. IMPRESSION: 1. Gallstones fill the gallbladder. The gallbladder is focally tender but there is no visible wall thickening or pericholecystic edema typical of acute cholecystitis. The degree of shadowing calculi limits assessment of the gallbladder. 2. Borderline common bile duct dilatation, please correlate with biliary labs. 3. Right hydronephrosis. Electronically Signed   By: Marnee Spring M.D.   On: 01/07/2019 07:11    Impression/Assessment:  68 year old female with moderate to severe right hydronephrosis secondary to right ureteral calculi.  Urinalysis is nitrite positive without significant pyuria.  A urine culture has been ordered.  Plan:  Based on her history of diabetes, nitrite positive urine and significant hydronephrosis with CT findings suspicious for forniceal rupture I have recommended cystoscopy with placement of a right ureteral stent urgently.  The procedure was discussed in detail including potential risks of bleeding, infection, ureteral injury as well as anesthetic risks.  The rare possibility of an impacted stone precluding stent placement was discussed and in that event she would need a percutaneous nephrostomy tube by interventional radiology.  It was stressed that we will be no attempt to remove her calculi due to the possibility of infection and she will need a definitive stone procedure in the near future.  Stent irritative symptoms were discussed including frequency, urgency, bladder spasm and flank pain.  She indicated all questions  were answered to her satisfaction and desires to proceed.  01/07/2019, 2:48 PM  Irineo Axon,  MD

## 2019-01-07 NOTE — ED Triage Notes (Addendum)
EMS pt to room 24 from home with report of pain to right lower abd radiating into her back. Woke her from sleep this morning. EMS report bp 180/96 pulse 60 temp 98.4. FS 137

## 2019-01-07 NOTE — ED Notes (Signed)
Pt to Ultrasound

## 2019-01-07 NOTE — ED Notes (Signed)
Pt unable to keep down contrast PO, MD aware., zofran given and CT called for scan at this time

## 2019-01-07 NOTE — Transfer of Care (Signed)
Immediate Anesthesia Transfer of Care Note  [name]  Procedure(s) Performed: CYSTOSCOPY WITH STENT PLACEMENT (Right Ureter) CYSTOSCOPY WITH RETROGRADE PYELOGRAM (Right Ureter)  Patient Location: PACU  Anesthesia Type:General  Level of Consciousness: awake and oriented  Airway & Oxygen Therapy: Patient Spontanous Breathing and Patient connected to face mask oxygen  Post-op Assessment: Report given to RN and Post -op Vital signs reviewed and stable  Post vital signs: Reviewed and stable  Last Vitals:  Vitals Value Taken Time  BP 140/77 01/07/2019  4:02 PM  Temp 36.9 C 01/07/2019  4:02 PM  Pulse 71 01/07/2019  4:06 PM  Resp 0 01/07/2019  4:06 PM  SpO2 100 % 01/07/2019  4:06 PM  Vitals shown include unvalidated device data.  Last Pain:  Vitals:   01/07/19 1215  TempSrc:   PainSc: 1          Complications: No apparent anesthesia complications

## 2019-01-07 NOTE — Anesthesia Post-op Follow-up Note (Signed)
Anesthesia QCDR form completed.        

## 2019-01-07 NOTE — Progress Notes (Signed)
Advanced care plan. Purpose of the Encounter: CODE STATUS Parties in Attendance: Patient Patient's Decision Capacity: Good Subjective/Patient's story: Cynthia Lucas  is a 68 y.o. female with a known history of type 2 diabetes mellitus GERD, hypertension, sleep apnea presented to the emergency room with right flank pain, nausea and vomiting Objective/Medical story Patient was worked up with CT abdomen which showed obstructing right renal calculi.  Needs urology evaluation and intervention.  Needs IV antibiotics for pyelonephritis. Goals of care determination:  Advance care directives goals of care and treatment plan discussed.  Patient wants everything done which includes CPR, intubation ventilator if the need arises. CODE STATUS: Full Code Time spent discussing advanced care planning: 16 minutes

## 2019-01-08 ENCOUNTER — Other Ambulatory Visit: Payer: Self-pay | Admitting: Urology

## 2019-01-08 ENCOUNTER — Telehealth: Payer: Self-pay | Admitting: Urology

## 2019-01-08 ENCOUNTER — Encounter: Payer: Self-pay | Admitting: Urology

## 2019-01-08 DIAGNOSIS — N201 Calculus of ureter: Secondary | ICD-10-CM

## 2019-01-08 LAB — CBC
HCT: 37.9 % (ref 36.0–46.0)
Hemoglobin: 12.1 g/dL (ref 12.0–15.0)
MCH: 26.4 pg (ref 26.0–34.0)
MCHC: 31.9 g/dL (ref 30.0–36.0)
MCV: 82.8 fL (ref 80.0–100.0)
Platelets: 289 10*3/uL (ref 150–400)
RBC: 4.58 MIL/uL (ref 3.87–5.11)
RDW: 14.6 % (ref 11.5–15.5)
WBC: 13.1 10*3/uL — ABNORMAL HIGH (ref 4.0–10.5)
nRBC: 0 % (ref 0.0–0.2)

## 2019-01-08 LAB — URINE CULTURE: Culture: NO GROWTH

## 2019-01-08 LAB — MAGNESIUM: Magnesium: 1.7 mg/dL (ref 1.7–2.4)

## 2019-01-08 LAB — BASIC METABOLIC PANEL
Anion gap: 12 (ref 5–15)
BUN: 23 mg/dL (ref 8–23)
CO2: 25 mmol/L (ref 22–32)
Calcium: 8.2 mg/dL — ABNORMAL LOW (ref 8.9–10.3)
Chloride: 104 mmol/L (ref 98–111)
Creatinine, Ser: 1.26 mg/dL — ABNORMAL HIGH (ref 0.44–1.00)
GFR calc Af Amer: 51 mL/min — ABNORMAL LOW (ref >60)
GFR calc non Af Amer: 44 mL/min — ABNORMAL LOW (ref >60)
Glucose, Bld: 246 mg/dL — ABNORMAL HIGH (ref 70–99)
Potassium: 3.4 mmol/L — ABNORMAL LOW (ref 3.5–5.1)
Sodium: 141 mmol/L (ref 135–145)

## 2019-01-08 LAB — GLUCOSE, CAPILLARY
Glucose-Capillary: 169 mg/dL — ABNORMAL HIGH (ref 70–99)
Glucose-Capillary: 135 mg/dL — ABNORMAL HIGH (ref 70–99)

## 2019-01-08 LAB — HEMOGLOBIN A1C
Hgb A1c MFr Bld: 6.4 % — ABNORMAL HIGH (ref 4.8–5.6)
Mean Plasma Glucose: 136.98 mg/dL

## 2019-01-08 MED ORDER — OXYBUTYNIN CHLORIDE 5 MG PO TABS: 5.0000 mg | ORAL_TABLET | Freq: Three times a day (TID) | ORAL | 0 refills | Status: AC | PRN

## 2019-01-08 MED ORDER — METFORMIN HCL 500 MG PO TABS: 500.0000 mg | ORAL_TABLET | Freq: Two times a day (BID) | ORAL | 2 refills | Status: DC

## 2019-01-08 MED ORDER — LEVOFLOXACIN 250 MG PO TABS: 250.0000 mg | ORAL_TABLET | Freq: Every day | ORAL | 0 refills | Status: AC

## 2019-01-08 MED ORDER — POTASSIUM CHLORIDE CRYS ER 20 MEQ PO TBCR
40.0000 meq | EXTENDED_RELEASE_TABLET | Freq: Once | ORAL | Status: AC
  Administered 2019-01-08: 09:00:00 40 meq via ORAL
  Filled 2019-01-08: qty 2

## 2019-01-08 MED ORDER — METFORMIN HCL 500 MG PO TABS
500.0000 mg | ORAL_TABLET | Freq: Two times a day (BID) | ORAL | Status: DC
  Administered 2019-01-08: 500 mg via ORAL
  Filled 2019-01-08: qty 1

## 2019-01-08 MED ORDER — MAGNESIUM SULFATE IN D5W 1-5 GM/100ML-% IV SOLN
1.0000 g | Freq: Once | INTRAVENOUS | Status: AC
  Administered 2019-01-08: 10:00:00 1 g via INTRAVENOUS
  Filled 2019-01-08 (×2): qty 100

## 2019-01-08 NOTE — Discharge Summary (Addendum)
[facility] [place] at [facility]   PATIENT NAME: Cynthia Lucas    MR#:  [id number]  DATE OF BIRTH:  March 08, 1951  DATE OF ADMISSION:  01/07/2019   ADMITTING PHYSICIAN: Ihor Austin, MD  DATE OF DISCHARGE: 01/08/2019  2:03 PM  PRIMARY CARE PHYSICIAN: Toy Cookey, FNP   ADMISSION DIAGNOSIS:   Kidney stone [N20.0] RUQ pain [R10.11] Urinary tract infection without hematuria, site unspecified [N39.0]  DISCHARGE DIAGNOSIS:   Active Problems:   Obstructive uropathy   SECONDARY DIAGNOSIS:   Past Medical History:  Diagnosis Date  . Cough    CHRONIC  . Diabetes mellitus without complication (HCC)   . GERD (gastroesophageal reflux disease)   . Hypertension   . Sleep apnea   . Tremor    HEAD    HOSPITAL COURSE:   68 year old female with past medical history significant for hypertension, GERD, sleep apnea, diabetes presents to hospital secondary to significant right flank pain, nausea vomiting and chills.  1.  Acute right renal colic with right hydronephrosis-came in with significant right flank pain, CT of the abdomen showing 2 obstructing right renal calculi with right-sided hydroureteronephrosis. -Appreciate urology consult.  -Patient had cystoscopy with retrograde pyelogram and stent placement on the right side. -Will be discharged on oral antibiotics and oxybutynin for stent irritative/bladder spasm symptoms. -Patient denies any complaints and he has been voiding well. -Follow-up with urology in 1 to 2 weeks for stent removal and definitive stone treatment  2.  UTI-secondary to high-grade ureteral obstruction from renal stone. -Cultures are pending.  Received IV Rocephin in the hospital.  Being discharged on Levaquin  3.  Diabetes mellitus-on metformin-dose adjusted  4.  Glaucoma-continue eyedrops  5.  Hyperlipidemia-on statin  Patient ambulatory, will be discharged home today  DISCHARGE CONDITIONS:   Guarded  CONSULTS OBTAINED:    Treatment Team:  Riki Altes, MD  DRUG ALLERGIES:   No Known Allergies DISCHARGE MEDICATIONS:   Allergies as of 01/08/2019   No Known Allergies     Medication List    STOP taking these medications   hydrochlorothiazide 25 MG tablet Commonly known as:  HYDRODIURIL     TAKE these medications   atorvastatin 40 MG tablet Commonly known as:  LIPITOR Take 40 mg by mouth daily.   bimatoprost 0.03 % ophthalmic solution Commonly known as:  LUMIGAN Place 1 drop into the left eye at bedtime.   COMBIGAN OP Apply 1 drop to eye once. To each eye   levofloxacin 250 MG tablet Commonly known as:  Levaquin Take 1 tablet (250 mg total) by mouth daily for 5 days.   metFORMIN 500 MG tablet Commonly known as:  GLUCOPHAGE Take 1 tablet (500 mg total) by mouth 2 (two) times daily with a meal. What changed:  when to take this   oxybutynin 5 MG tablet Commonly known as:  DITROPAN Take 1 tablet (5 mg total) by mouth 3 (three) times daily as needed for up to 30 days for bladder spasms (Urinary frequency, urgency).   ranitidine 150 MG tablet Commonly known as:  ZANTAC Take 150 mg by mouth 2 (two) times daily.        DISCHARGE INSTRUCTIONS:   1.  PCP follow-up in 1 to 2 weeks 2.  Urology follow-up in 2 weeks   DIET:   Cardiac diet  ACTIVITY:   Activity as tolerated  OXYGEN:   Home Oxygen: No.  Oxygen Delivery: room air  DISCHARGE LOCATION:   home   If  you experience worsening of your admission symptoms, develop shortness of breath, life threatening emergency, suicidal or homicidal thoughts you must seek medical attention immediately by calling 911 or calling your MD immediately  if symptoms less severe.  You Must read complete instructions/literature along with all the possible adverse reactions/side effects for all the Medicines you take and that have been prescribed to you. Take any new Medicines after you have completely understood and accpet all the possible  adverse reactions/side effects.   Please note  You were cared for by a hospitalist during your hospital stay. If you have any questions about your discharge medications or the care you received while you were in the hospital after you are discharged, you can call the unit and asked to speak with the hospitalist on call if the hospitalist that took care of you is not available. Once you are discharged, your primary care physician will handle any further medical issues. Please note that NO REFILLS for any discharge medications will be authorized once you are discharged, as it is imperative that you return to your primary care physician (or establish a relationship with a primary care physician if you do not have one) for your aftercare needs so that they can reassess your need for medications and monitor your lab values.    On the day of Discharge:  VITAL SIGNS:   Blood pressure 121/79, pulse (!) 59, temperature 98.2 F (36.8 C), temperature source Oral, resp. rate 20, height 5\' 7"  (1.702 m), weight 90.7 kg, SpO2 98 %.  PHYSICAL EXAMINATION:    GENERAL:  68 y.o.-year-old patient lying in the bed with no acute distress.  Central tremors of the head noted. EYES: right pupil round, reactive to light and accommodation. 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 No scleral icterus. Extraocular muscles intact.  HEENT: Head atraumatic, normocephalic. Oropharynx and nasopharynx clear.  NECK:  Supple, no jugular venous distention. No thyroid enlargement, no tenderness.  LUNGS: Normal breath sounds bilaterally, no wheezing, rales,rhonchi or crepitation. No use of accessory muscles of respiration. Decreased bibasilar breath sounds CARDIOVASCULAR: S1, S2 normal. No murmurs, rubs, or gallops.  ABDOMEN: Soft, non-tender, non-distended. Bowel sounds present. No organomegaly or mass.  EXTREMITIES: No pedal edema, cyanosis, or clubbing.  NEUROLOGIC: Cranial nerves II through XII are intact. Muscle strength  5/5 in all extremities. Sensation intact. Gait not checked.  PSYCHIATRIC: The patient is alert and oriented x 3.  SKIN: No obvious rash, lesion, or ulcer.   DATA REVIEW:   CBC Recent Labs  Lab 01/08/19 0343  WBC 13.1*  HGB 12.1  HCT 37.9  PLT 289    Chemistries  Recent Labs  Lab 01/07/19 0558 01/08/19 0343  NA 142 141  K 3.2* 3.4*  CL 103 104  CO2 28 25  GLUCOSE 165* 246*  BUN 18 23  CREATININE 1.06* 1.26*  CALCIUM 8.7* 8.2*  MG  --  1.7  AST 19  --   ALT 20  --   ALKPHOS 89  --   BILITOT 0.6  --      Microbiology Results  Results for orders placed or performed during the hospital encounter of 01/07/19  Urine Culture     Status: Abnormal (Preliminary result)   Collection Time: 01/07/19 12:46 PM  Result Value Ref Range Status   Specimen Description   Final    URINE, RANDOM Performed at [facility], [address] [place], [place] [address]    Special Requests   Final  NONE Performed at [facility], [address] [place], [place] [address]    Culture >=100,000 COLONIES/mL ESCHERICHIA COLI (A)  Final   Report Status PENDING  Incomplete  Urine Culture     Status: None   Collection Time: 01/07/19  3:42 PM  Result Value Ref Range Status   Specimen Description   Final    URINE, RANDOM Performed at [facility], [address] [place], [place] [address]    Special Requests   Final    RT RENAL /PELVIS Performed at [facility], [address] [place], [place] [address]    Culture   Final    NO GROWTH Performed at [facility], [address] [place], [place] [address]    Report Status 01/08/2019 FINAL  Final    RADIOLOGY:  No results found.   Management plans discussed with the patient, family and they are in agreement.  CODE STATUS:     Code Status Orders  (From admission, onward)         Start     Ordered   01/07/19 1700  Full code  Continuous     01/07/19 1659        Code Status  History    This patient has a current code status but no historical code status.      TOTAL TIME TAKING CARE OF THIS PATIENT: 38 minutes.    Enid Baas M.D on 01/08/2019 at 3:17 PM  Between 7am to 6pm - Pager - [phone]  After 6pm go to [email or web address] - Social research officer, government  [facility] [place] [facility]  Office  [phone]  CC: Primary care physician; Toy Cookey, FNP   Note: This dictation was prepared with Dragon dictation along with smaller phrase technology. Any transcriptional errors that result from this process are unintentional.

## 2019-01-08 NOTE — Telephone Encounter (Signed)
App made nurse on 2-C notified  stoioff to put KUB order in  [place]

## 2019-01-08 NOTE — Telephone Encounter (Signed)
-----   Message from Riki Altes, MD sent at 01/08/2019  9:20 AM EDT ----- Regarding: Hospital follow-up Please schedule follow-up with me 2 weeks with KUB prior.

## 2019-01-08 NOTE — Progress Notes (Signed)
Urology Consult Follow Up  Subjective: Feeling much better, flank pain resolved.  Mild to moderate irritative voiding symptoms  Afebrile, VSS  Anti-infectives: Anti-infectives (From admission, onward)   Start     Dose/Rate Route Frequency Ordered Stop   01/07/19 1345  cefTRIAXone (ROCEPHIN) 1 g in sodium chloride 0.9 % 100 mL IVPB     1 g 200 mL/hr over 30 Minutes Intravenous Every 24 hours 01/07/19 1333     01/07/19 1300  cefTRIAXone (ROCEPHIN) 1 g in sodium chloride 0.9 % 100 mL IVPB  Status:  Discontinued     1 g 200 mL/hr over 30 Minutes Intravenous  Once 01/07/19 1245 01/07/19 1341      Current Facility-Administered Medications  Medication Dose Route Frequency Provider Last Rate Last Dose  . 0.9 %  sodium chloride infusion   Intravenous Continuous Ihor AustinPyreddy, Pavan, MD 100 mL/hr at 01/08/19 0602    . acetaminophen (TYLENOL) tablet 650 mg  650 mg Oral Q6H PRN Ihor AustinPyreddy, Pavan, MD       Or  . acetaminophen (TYLENOL) suppository 650 mg  650 mg Rectal Q6H PRN Pyreddy, Vivien RotaPavan, MD      . atorvastatin (LIPITOR) tablet 40 mg  40 mg Oral Daily Pyreddy, Vivien RotaPavan, MD   40 mg at 01/07/19 1756  . brimonidine (ALPHAGAN) 0.2 % ophthalmic solution 1 drop  1 drop Both Eyes Daily Pyreddy, Pavan, MD   1 drop at 01/07/19 1840   And  . timolol (TIMOPTIC) 0.5 % ophthalmic solution 1 drop  1 drop Both Eyes Daily Pyreddy, Vivien RotaPavan, MD   1 drop at 01/07/19 1838  . cefTRIAXone (ROCEPHIN) 1 g in sodium chloride 0.9 % 100 mL IVPB  1 g Intravenous Q24H Stoioff, Verna CzechScott C, MD   Stopped at 01/07/19 1720  . enoxaparin (LOVENOX) injection 40 mg  40 mg Subcutaneous Q24H Pyreddy, Pavan, MD      . HYDROcodone-acetaminophen (NORCO/VICODIN) 5-325 MG per tablet 1-2 tablet  1-2 tablet Oral Q4H PRN Pyreddy, Pavan, MD      . insulin aspart (novoLOG) injection 0-15 Units  0-15 Units Subcutaneous TID WC Stoioff, Scott C, MD      . insulin aspart (novoLOG) injection 0-5 Units  0-5 Units Subcutaneous QHS Stoioff, Scott C, MD      .  latanoprost (XALATAN) 0.005 % ophthalmic solution 1 drop  1 drop Left Eye QHS Ihor AustinPyreddy, Pavan, MD   1 drop at 01/07/19 2143  . magnesium sulfate IVPB 1 g 100 mL  1 g Intravenous Once Enid BaasKalisetti, Radhika, MD      . metFORMIN (GLUCOPHAGE) tablet 500 mg  500 mg Oral BID WC Enid BaasKalisetti, Radhika, MD      . morphine 2 MG/ML injection 2 mg  2 mg Intravenous Q4H PRN Stoioff, Scott C, MD      . ondansetron (ZOFRAN) tablet 4 mg  4 mg Oral Q6H PRN Pyreddy, Vivien RotaPavan, MD       Or  . ondansetron (ZOFRAN) injection 4 mg  4 mg Intravenous Q6H PRN Pyreddy, Vivien RotaPavan, MD      . oxybutynin (DITROPAN) tablet 5 mg  5 mg Oral TID PRN Stoioff, Scott C, MD      . potassium chloride SA (K-DUR,KLOR-CON) CR tablet 40 mEq  40 mEq Oral Once Enid BaasKalisetti, Radhika, MD      . senna-docusate (Senokot-S) tablet 1 tablet  1 tablet Oral QHS PRN Ihor AustinPyreddy, Pavan, MD         Objective: Vital signs in last 24 hours: Temp:  [98.2 F (  36.8 C)-98.8 F (37.1 C)] 98.2 F (36.8 C) (04/07 0411) Pulse Rate:  [59-81] 63 (04/07 0411) Resp:  [11-22] 20 (04/07 0411) BP: (99-154)/(55-78) 115/78 (04/07 0411) SpO2:  [90 %-100 %] 97 % (04/07 0411)  Intake/Output from previous day: 04/06 0701 - 04/07 0700 In: 1918 [P.O.:118; I.V.:1700; IV Piggyback:100] Out: 250 [Urine:250] Intake/Output this shift: No intake/output data recorded.   Physical Exam: Abdomen soft, nontender  Lab Results:  Recent Labs    01/07/19 0558 01/08/19 0343  WBC 9.5 13.1*  HGB 13.1 12.1  HCT 40.2 37.9  PLT 291 289   BMET Recent Labs    01/07/19 0558 01/08/19 0343  NA 142 141  K 3.2* 3.4*  CL 103 104  CO2 28 25  GLUCOSE 165* 246*  BUN 18 23  CREATININE 1.06* 1.26*  CALCIUM 8.7* 8.2*     Assessment: Doing well status post placement right ureteral stent for renal colic/high-grade obstruction and possible UTI.  She has been afebrile since her hospitalization.  Intraoperative urine culture from renal pelvis pending  s/p Procedure(s): CYSTOSCOPY WITH STENT  PLACEMENT CYSTOSCOPY WITH RETROGRADE PYELOGRAM  Plan: Okay for discharge from urology standpoint.  Would send home on oral antibiotics pending urine culture.  Oxybutynin as needed for stent irritative symptoms.  Urology follow-up with me approximately 2 weeks.    LOS: 1 day    Riki Altes 01/08/2019

## 2019-01-09 LAB — URINE CULTURE: Culture: >=100000 — AB

## 2019-01-09 LAB — HIV ANTIBODY (ROUTINE TESTING W REFLEX): HIV Screen 4th Generation wRfx: NONREACTIVE

## 2019-01-09 NOTE — Anesthesia Postprocedure Evaluation (Deleted)
Anesthesia Post Note  [name]  Procedure(s) Performed: CYSTOSCOPY WITH STENT PLACEMENT (Right Ureter) CYSTOSCOPY WITH RETROGRADE PYELOGRAM (Right Ureter)  Patient location during evaluation: PACU Anesthesia Type: General Level of consciousness: awake and alert Pain management: pain level controlled Vital Signs Assessment: post-procedure vital signs reviewed and stable Respiratory status: spontaneous breathing, nonlabored ventilation, respiratory function stable and patient connected to nasal cannula oxygen Cardiovascular status: blood pressure returned to baseline and stable Postop Assessment: no apparent nausea or vomiting Anesthetic complications: no     Last Vitals:  Vitals:   01/08/19 0411 01/08/19 1142  BP: 115/78 121/79  Pulse: 63 (!) 59  Resp: 20   Temp: 36.8 C 36.8 C  SpO2: 97% 98%    Last Pain:  Vitals:   01/08/19 1142  TempSrc: Oral  PainSc:                  [name]

## 2019-01-15 NOTE — Anesthesia Postprocedure Evaluation (Signed)
Anesthesia Post Note  [name]  Procedure(s) Performed: CYSTOSCOPY WITH STENT PLACEMENT (Right Ureter) CYSTOSCOPY WITH RETROGRADE PYELOGRAM (Right Ureter)  Patient location during evaluation: PACU Anesthesia Type: General Level of consciousness: awake and alert Pain management: pain level controlled Vital Signs Assessment: post-procedure vital signs reviewed and stable Respiratory status: spontaneous breathing, nonlabored ventilation and respiratory function stable Cardiovascular status: blood pressure returned to baseline and stable Postop Assessment: no apparent nausea or vomiting Anesthetic complications: no     Last Vitals:  Vitals:   01/08/19 0411 01/08/19 1142  BP: 115/78 121/79  Pulse: 63 (!) 59  Resp: 20   Temp: 36.8 C 36.8 C  SpO2: 97% 98%    Last Pain:  Vitals:   01/08/19 1142  TempSrc: Oral  PainSc:                  [name]

## 2019-01-22 ENCOUNTER — Ambulatory Visit
Admission: RE | Admit: 2019-01-22 | Discharge: 2019-01-22 | Disposition: A | Discharge status: Home / Self Care | Payer: Medicare Other | Source: Ambulatory Visit | Attending: Urology | Admitting: Urology

## 2019-01-22 ENCOUNTER — Other Ambulatory Visit: Payer: Self-pay | Admitting: Radiology

## 2019-01-22 ENCOUNTER — Telehealth: Payer: Self-pay | Admitting: Radiology

## 2019-01-22 ENCOUNTER — Ambulatory Visit (INDEPENDENT_AMBULATORY_CARE_PROVIDER_SITE_OTHER): Payer: Medicare Other | Admitting: Urology

## 2019-01-22 ENCOUNTER — Other Ambulatory Visit: Payer: Self-pay

## 2019-01-22 ENCOUNTER — Encounter: Payer: Self-pay | Admitting: Urology

## 2019-01-22 VITALS — BP 130/71 | HR 90 | Ht 67.0 in | Wt 200.0 lb

## 2019-01-22 DIAGNOSIS — N201 Calculus of ureter: Secondary | ICD-10-CM

## 2019-01-22 LAB — URINALYSIS, COMPLETE
Bilirubin, UA: NEGATIVE
Glucose, UA: NEGATIVE
Ketones, UA: NEGATIVE
Nitrite, UA: NEGATIVE
Specific Gravity, UA: >1.03 — ABNORMAL HIGH (ref 1.005–1.030)
Urobilinogen, Ur: 0.2 mg/dL (ref 0.2–1.0)
pH, UA: 5.5 (ref 5.0–7.5)

## 2019-01-22 LAB — MICROSCOPIC EXAMINATION: RBC, Urine: >30 /hpf — AB (ref 0–2)

## 2019-01-22 NOTE — Progress Notes (Signed)
 01/22/2019 1:22 PM   Cynthia Lucas 02/03/1951 [id number]  Referring provider: Headrick, Emily, FNP [address] [place], [place] [address]  Chief Complaint  Patient presents with  . Follow-up    HPI: Cynthia Lucas is a 67-year-old female who presented to the ED on 01/07/2019 with severe right flank pain secondary to obstructing ureteral calculi in the right proximal ureter and right mid ureter she had nitrite positive urine but no significant pyuria.  Urgent stent placement was elected which was performed on 01/07/2019.  Her intraoperative urine culture from renal pelvis was negative and her voided urine grew E. coli.  She was discharged on Levaquin and has completed this course.  She is having moderate stent symptoms and had onset of dysuria today.  Denies fever or chills.  No previous history of stone disease.   PMH: Past Medical History:  Diagnosis Date  . Cough    CHRONIC  . Diabetes mellitus without complication (HCC)   . GERD (gastroesophageal reflux disease)   . Hypertension   . Sleep apnea   . Tremor    HEAD    Surgical History: Past Surgical History:  Procedure Laterality Date  . ABDOMINAL HYSTERECTOMY    . CATARACT EXTRACTION W/PHACO Left 03/07/2018   Procedure: CATARACT EXTRACTION PHACO AND INTRAOCULAR LENS PLACEMENT (IOC);  Surgeon: Porfilio, William, MD;  Location: [facility] ORS;  Service: Ophthalmology;  Laterality: Left;  Lot # [id number] US: 04:19.8 AP%: 19.9 CDE: 51.70  . CESAREAN SECTION    . COLONOSCOPY    . CYSTOSCOPY W/ RETROGRADES Right 01/07/2019   Procedure: CYSTOSCOPY WITH RETROGRADE PYELOGRAM;  Surgeon: Latice Waitman C, MD;  Location: [facility] ORS;  Service: Urology;  Laterality: Right;  . CYSTOSCOPY WITH STENT PLACEMENT Right 01/07/2019   Procedure: CYSTOSCOPY WITH STENT PLACEMENT;  Surgeon: Supreme Rybarczyk C, MD;  Location: [facility] ORS;  Service: Urology;  Laterality: Right;  . JOINT REPLACEMENT      Home Medications:  Allergies as of 01/22/2019   No Known  Allergies     Medication List       Accurate as of January 22, 2019  1:22 PM. Always use your most recent med list.        atorvastatin 40 MG tablet Commonly known as:  LIPITOR Take 40 mg by mouth daily.   bimatoprost 0.03 % ophthalmic solution Commonly known as:  LUMIGAN Place 1 drop into the left eye at bedtime.   Lumigan 0.01 % Soln Generic drug:  bimatoprost   COMBIGAN OP Apply 1 drop to eye once. To each eye   Combigan 0.2-0.5 % ophthalmic solution Generic drug:  brimonidine-timolol   hydrochlorothiazide 25 MG tablet Commonly known as:  HYDRODIURIL   metFORMIN 500 MG tablet Commonly known as:  GLUCOPHAGE Take 1 tablet (500 mg total) by mouth 2 (two) times daily with a meal.   oxybutynin 5 MG tablet Commonly known as:  DITROPAN Take 1 tablet (5 mg total) by mouth 3 (three) times daily as needed for up to 30 days for bladder spasms (Urinary frequency, urgency).   ranitidine 150 MG tablet Commonly known as:  ZANTAC Take 150 mg by mouth 2 (two) times daily.       Allergies: No Known Allergies  Family History: Family History  Problem Relation Age of Onset  . Heart disease Mother     Social History:  reports that she has never smoked. She has never used smokeless tobacco. She reports current alcohol use. She reports that she does not use drugs.    ROS: UROLOGY Frequent Urination?: No Hard to postpone urination?: No Burning/pain with urination?: No Get up at night to urinate?: No Leakage of urine?: No Urine stream starts and stops?: No Trouble starting stream?: No Do you have to strain to urinate?: No Blood in urine?: Yes Urinary tract infection?: No Sexually transmitted disease?: No Injury to kidneys or bladder?: No Painful intercourse?: No Weak stream?: No Currently pregnant?: No Vaginal bleeding?: No Last menstrual period?: N/A  Gastrointestinal Nausea?: No Vomiting?: No Indigestion/heartburn?: No Diarrhea?: No Constipation?: No   Constitutional Fever: No Night sweats?: No Weight loss?: No Fatigue?: No  Skin Skin rash/lesions?: No Itching?: No  Eyes Blurred vision?: No Double vision?: No  Ears/Nose/Throat Sore throat?: No Sinus problems?: No  Hematologic/Lymphatic Swollen glands?: No Easy bruising?: No  Cardiovascular Leg swelling?: No Chest pain?: No  Respiratory Cough?: No Shortness of breath?: No  Endocrine Excessive thirst?: No  Musculoskeletal Back pain?: No Joint pain?: No  Neurological Headaches?: No Dizziness?: No  Psychologic Depression?: No Anxiety?: No  Physical Exam: BP 130/71 (BP Location: Left Arm, Patient Position: Sitting, Cuff Size: Large)   Pulse 90   Ht 5\' 7"  (1.702 m)   Wt 200 lb (90.7 kg)   BMI 31.32 kg/m   Constitutional:  Alert and oriented, No acute distress. HEENT: [place] AT, moist mucus membranes.  Trachea midline, no masses. Cardiovascular: No clubbing, cyanosis, or edema.  RRR Respiratory: Normal respiratory effort, no increased work of breathing.  Clear GI: Abdomen is soft, nontender, nondistended, no abdominal masses GU: No CVA tenderness Lymph: No cervical or inguinal lymphadenopathy. Skin: No rashes, bruises or suspicious lesions. Neurologic: Grossly intact, no focal deficits, moving all 4 extremities. Psychiatric: Normal mood and affect.  Laboratory Data:  Urinalysis: Dipstick 3+ blood/1+ leukocytes/nitrite negative Microscopy 6-10 WBC/>30 RBC   Assessment & Plan:   68 year old female status post right ureteral stent placement for obstructing right ureteral calculi.  A KUB was obtained.  The stent is in good position.  The proximal and mid ureteral calculi are not definitely seen.  Her best treatment option would be ureteroscopy.  The calculi would be difficult to treat via shockwave lithotripsy.  The indications and nature of the planned procedure were discussed as well as the potential  benefits and expected outcome.  Alternatives have  been discussed in detail. The most common complications and side effects were discussed including but not limited to infection/sepsis; blood loss; damage to urethra, bladder, ureter, kidney; need for multiple surgeries; need for prolonged stent placement as well as general anesthesia risks. Although uncommon she since she has had an indwelling stent; she was also informed of the possibility that the calculus may not be able to be treated due to inability to obtain access to the upper ureter. All of her questions were answered and she desires to proceed.  Preop urine culture was ordered   Riki Altes, MD  [facility] [address] [place], [place] [address] [phone]

## 2019-01-22 NOTE — Telephone Encounter (Signed)
Discussed the [facility] Surgery Information form below with patient over the phone.    [address], [address] [place], [place] [address] Telephone: [phone] Fax: [phone]   Thank you for choosing [facility] for your upcoming surgery!  We are always here to assist in your urological needs.  Please read the following information with specific details for your upcoming appointments related to your surgery. Please contact Roshini Fulwider at [phone] Option 3 with any questions.  The Name of Your Surgery: Right ureteroscopy, laser lithotripsy, stone removal, ureteral stent placement Your Surgery Date: 01/29/2019 Your Surgeon: Irineo Axon  Please call Same Day Surgery at [phone] between the hours of 1pm-3pm one day prior to your surgery. They will inform you of the time to arrive at Same Day Surgery which is located on the second floor of the [facility].   Please refer to the attached letter regarding instructions for Pre-Admission Testing. You will receive a call from the Pre-Admission Testing office regarding your appointment with them.  The Pre-Admission Testing office is located at [address] [facility], on the first floor of the [facility] in [address] (office is to the right as you enter through the [employer] of the [employer]). Please have all medications you are currently taking and your insurance card available.   Patient was advised to have nothing to eat or drink after midnight the night prior to surgery except that she may have only water until 2 hours before surgery with nothing to drink within 2 hours of surgery.  The patient states she currently takes no blood thinners. Patient's questions were answered and she expressed understanding of these instructions.

## 2019-01-22 NOTE — H&P (View-Only) (Signed)
01/22/2019 1:22 PM   Cynthia KeySharon Lucas [birth year] [id number]  Referring provider: Toy CookeyHeadrick, Emily, FNP [address] [place], [place] [id number]  Chief Complaint  Patient presents with  . Follow-up    HPI: Cynthia Lucas is a 68 year old female who presented to the ED on 01/07/2019 with severe right flank pain secondary to obstructing ureteral calculi in the right proximal ureter and right mid ureter she had nitrite positive urine but no significant pyuria.  Urgent stent placement was elected which was performed on 01/07/2019.  Her intraoperative urine culture from renal pelvis was negative and her voided urine grew E. coli.  She was discharged on Levaquin and has completed this course.  She is having moderate stent symptoms and had onset of dysuria today.  Denies fever or chills.  No previous history of stone disease.   PMH: Past Medical History:  Diagnosis Date  . Cough    CHRONIC  . Diabetes mellitus without complication (HCC)   . GERD (gastroesophageal reflux disease)   . Hypertension   . Sleep apnea   . Tremor    HEAD    Surgical History: Past Surgical History:  Procedure Laterality Date  . ABDOMINAL HYSTERECTOMY    . CATARACT EXTRACTION W/PHACO Left 03/07/2018   Procedure: CATARACT EXTRACTION PHACO AND INTRAOCULAR LENS PLACEMENT (IOC);  Surgeon: Galen ManilaPorfilio, William, MD;  Location: [facility] ORS;  Service: Ophthalmology;  Laterality: Left;  Lot # [id number] H US: 04:19.8 AP%: 19.9 CDE: 51.70  . CESAREAN SECTION    . COLONOSCOPY    . CYSTOSCOPY W/ RETROGRADES Right 01/07/2019   Procedure: CYSTOSCOPY WITH RETROGRADE PYELOGRAM;  Surgeon: Riki AltesStoioff, Scott C, MD;  Location: [facility] ORS;  Service: Urology;  Laterality: Right;  . CYSTOSCOPY WITH STENT PLACEMENT Right 01/07/2019   Procedure: CYSTOSCOPY WITH STENT PLACEMENT;  Surgeon: Riki AltesStoioff, Scott C, MD;  Location: [facility] OR[facility];  Service: Urology;  Laterality: Right;  . JOINT REPLACEMENT      Home Medications:  Allergies as of 01/22/2019   No Known  Allergies     Medication List       Accurate as of January 22, 2019  1:22 PM. Always use your most recent med list.        atorvastatin 40 MG tablet Commonly known as:  LIPITOR Take 40 mg by mouth daily.   bimatoprost 0.03 % ophthalmic solution Commonly known as:  LUMIGAN Place 1 drop into the left eye at bedtime.   Lumigan 0.01 % Soln Generic drug:  bimatoprost   COMBIGAN OP Apply 1 drop to eye once. To each eye   Combigan 0.2-0.5 % ophthalmic solution Generic drug:  brimonidine-timolol   hydrochlorothiazide 25 MG tablet Commonly known as:  HYDRODIURIL   metFORMIN 500 MG tablet Commonly known as:  GLUCOPHAGE Take 1 tablet (500 mg total) by mouth 2 (two) times daily with a meal.   oxybutynin 5 MG tablet Commonly known as:  DITROPAN Take 1 tablet (5 mg total) by mouth 3 (three) times daily as needed for up to 30 days for bladder spasms (Urinary frequency, urgency).   ranitidine 150 MG tablet Commonly known as:  ZANTAC Take 150 mg by mouth 2 (two) times daily.       Allergies: No Known Allergies  Family History: Family History  Problem Relation Age of Onset  . Heart disease Mother     Social History:  reports that she has never smoked. She has never used smokeless tobacco. She reports current alcohol use. She reports that she does not use drugs.  ROS: UROLOGY Frequent Urination?: No Hard to postpone urination?: No Burning/pain with urination?: No Get up at night to urinate?: No Leakage of urine?: No Urine stream starts and stops?: No Trouble starting stream?: No Do you have to strain to urinate?: No Blood in urine?: Yes Urinary tract infection?: No Sexually transmitted disease?: No Injury to kidneys or bladder?: No Painful intercourse?: No Weak stream?: No Currently pregnant?: No Vaginal bleeding?: No Last menstrual period?: N/A  Gastrointestinal Nausea?: No Vomiting?: No Indigestion/heartburn?: No Diarrhea?: No Constipation?: No   Constitutional Fever: No Night sweats?: No Weight loss?: No Fatigue?: No  Skin Skin rash/lesions?: No Itching?: No  Eyes Blurred vision?: No Double vision?: No  Ears/Nose/Throat Sore throat?: No Sinus problems?: No  Hematologic/Lymphatic Swollen glands?: No Easy bruising?: No  Cardiovascular Leg swelling?: No Chest pain?: No  Respiratory Cough?: No Shortness of breath?: No  Endocrine Excessive thirst?: No  Musculoskeletal Back pain?: No Joint pain?: No  Neurological Headaches?: No Dizziness?: No  Psychologic Depression?: No Anxiety?: No  Physical Exam: BP 130/71 (BP Location: Left Arm, Patient Position: Sitting, Cuff Size: Large)   Pulse 90   Ht 5\' 7"  (1.702 m)   Wt 200 lb (90.7 kg)   BMI 31.32 kg/m   Constitutional:  Alert and oriented, No acute distress. HEENT: [place] AT, moist mucus membranes.  Trachea midline, no masses. Cardiovascular: No clubbing, cyanosis, or edema.  RRR Respiratory: Normal respiratory effort, no increased work of breathing.  Clear GI: Abdomen is soft, nontender, nondistended, no abdominal masses GU: No CVA tenderness Lymph: No cervical or inguinal lymphadenopathy. Skin: No rashes, bruises or suspicious lesions. Neurologic: Grossly intact, no focal deficits, moving all 4 extremities. Psychiatric: Normal mood and affect.  Laboratory Data:  Urinalysis: Dipstick 3+ blood/1+ leukocytes/nitrite negative Microscopy 6-10 WBC/>30 RBC   Assessment & Plan:   68 year old female status post right ureteral stent placement for obstructing right ureteral calculi.  A KUB was obtained.  The stent is in good position.  The proximal and mid ureteral calculi are not definitely seen.  Her best treatment option would be ureteroscopy.  The calculi would be difficult to treat via shockwave lithotripsy.  The indications and nature of the planned procedure were discussed as well as the potential  benefits and expected outcome.  Alternatives have  been discussed in detail. The most common complications and side effects were discussed including but not limited to infection/sepsis; blood loss; damage to urethra, bladder, ureter, kidney; need for multiple surgeries; need for prolonged stent placement as well as general anesthesia risks. Although uncommon she since she has had an indwelling stent; she was also informed of the possibility that the calculus may not be able to be treated due to inability to obtain access to the upper ureter. All of her questions were answered and she desires to proceed.  Preop urine culture was ordered   Riki Altes, MD  [facility] [address] [place], [place] [address] [phone]

## 2019-01-25 LAB — CULTURE, URINE COMPREHENSIVE

## 2019-01-28 ENCOUNTER — Other Ambulatory Visit: Payer: Self-pay | Admitting: Radiology

## 2019-01-28 ENCOUNTER — Other Ambulatory Visit: Payer: Self-pay

## 2019-01-28 ENCOUNTER — Telehealth: Payer: Self-pay | Admitting: Radiology

## 2019-01-28 ENCOUNTER — Encounter
Admission: RE | Admit: 2019-01-28 | Discharge: 2019-01-28 | Disposition: A | Discharge status: Home / Self Care | Payer: Medicare Other | Source: Ambulatory Visit | Attending: Urology | Admitting: Urology

## 2019-01-28 ENCOUNTER — Other Ambulatory Visit: Payer: Self-pay | Admitting: Urology

## 2019-01-28 DIAGNOSIS — N201 Calculus of ureter: Secondary | ICD-10-CM

## 2019-01-28 MED ORDER — LEVOFLOXACIN 500 MG PO TABS: 500.0000 mg | ORAL_TABLET | Freq: Every day | ORAL | 0 refills | Status: AC

## 2019-01-28 MED ORDER — LEVOFLOXACIN IN D5W 500 MG/100ML IV SOLN
500.0000 mg | INTRAVENOUS | Status: AC
  Administered 2019-01-29: 08:00:00 500 mg via INTRAVENOUS

## 2019-01-28 NOTE — Patient Instructions (Signed)
Your procedure is scheduled on: 01-29-19  Report to Same Day Surgery 2nd floor medical mall [facility](Medical Mall Entrance-take elevator on left to 2nd floor.  Check in with surgery information desk.) @ [phone]   Remember: Instructions that are not followed completely may result in serious medical risk, up to and including death, or upon the discretion of your surgeon and anesthesiologist your surgery may need to be rescheduled.    _x___ 1. Do not eat food after midnight the night before your procedure. You may drink water up to 2 hours before you are scheduled to arrive at the hospital for your procedure.  Do not drink water within 2 hours of your scheduled arrival to the hospital.  Type 1 and type 2 diabetics should only drink water.   ____Ensure clear carbohydrate drink on the way to the hospital for bariatric patients  ____Ensure clear carbohydrate drink 3 hours before surgery for Dr Rutherford NailByrnett's patients if physician instructed.   No gum chewing or hard candies.     __x__ 2. No Alcohol for 24 hours before or after surgery.   __x__3. No Smoking or e-cigarettes for 24 prior to surgery.  Do not use any chewable tobacco products for at least 6 hour prior to surgery   ____  4. Bring all medications with you on the day of surgery if instructed.    __x__ 5. Notify your doctor if there is any change in your medical condition     (cold, fever, infections).    x___6. On the morning of surgery brush your teeth with toothpaste and water.  You may rinse your mouth with mouth wash if you wish.  Do not swallow any toothpaste or mouthwash.   Do not wear jewelry, make-up, hairpins, clips or nail polish.  Do not wear lotions, powders, or perfumes. You may wear deodorant.  Do not shave 48 hours prior to surgery. Men may shave face and neck.  Do not bring valuables to the hospital.    [facility] is not responsible for any belongings or valuables.               Contacts, dentures or bridgework may not be worn  into surgery.  Leave your suitcase in the car. After surgery it may be brought to your room.  For patients admitted to the hospital, discharge time is determined by your treatment team.  _  Patients discharged the day of surgery will not be allowed to drive home.  You will need someone to drive you home and stay with you the night of your procedure.    Please read over the following fact sheets that you were given:   [facility] Preparing for [facility]   ____ Take anti-hypertensive listed below, cardiac, seizure, asthma,     anti-reflux and psychiatric medicines. These include:  1. NONE  2.  3.  4.  5.  6.  ____Fleets enema or Magnesium Citrate as directed.   ____ Use CHG Soap or sage wipes as directed on instruction sheet   ____ Use inhalers on the day of surgery and bring to hospital day of surgery  _X___ Stop Metformin 2 days prior to surgery-STOP NOW-LAST DOSE WAS 01-27-19    ____ Take 1/2 of usual insulin dose the night before surgery and none on the morning surgery.   ____ Follow recommendations from Cardiologist, Pulmonologist or PCP regarding stopping Aspirin, Coumadin, Plavix ,Eliquis, Effient, or Pradaxa, and Pletal.  X____Stop Anti-inflammatories such as Advil, Aleve, Ibuprofen, Motrin, Naproxen, Naprosyn, Goodies  powders or aspirin products NOW-OK to take Tylenol    _x___ Stop supplements until after surgery-STOP HAIR, SKIN AND NAILS NOW-MAY RESUME AFTER SURGERY   ____ Bring C-Pap to the hospital.

## 2019-01-28 NOTE — Telephone Encounter (Signed)
-----   Message from Riki Altes, MD sent at 01/28/2019 11:42 AM EDT ----- Preop urine culture grew a low level of enterococcus which is most likely colonization.  Antibiotic Rx Levaquin sent to pharmacy.  Please make sure she gets 1 dose in today and change IV antibiotic from Ancef to Levaquin 500 mg.  Thanks

## 2019-01-28 NOTE — Telephone Encounter (Signed)
LMOM to notify patient of script sent to pharmacy. Encouraged patient to begin medication today.

## 2019-01-29 ENCOUNTER — Ambulatory Visit: Payer: Medicare Other | Admitting: Anesthesiology

## 2019-01-29 ENCOUNTER — Encounter
Admission: RE | Disposition: A | Discharge status: Home / Self Care | Payer: Self-pay | Source: Home / Self Care | Attending: Urology

## 2019-01-29 ENCOUNTER — Ambulatory Visit
Admission: RE | Admit: 2019-01-29 | Discharge: 2019-01-29 | Disposition: A | Discharge status: Home / Self Care | Payer: Medicare Other | Attending: Urology | Admitting: Urology

## 2019-01-29 ENCOUNTER — Other Ambulatory Visit: Payer: Self-pay

## 2019-01-29 DIAGNOSIS — G4730 Sleep apnea, unspecified: Secondary | ICD-10-CM | POA: Insufficient documentation

## 2019-01-29 DIAGNOSIS — I10 Essential (primary) hypertension: Secondary | ICD-10-CM | POA: Insufficient documentation

## 2019-01-29 DIAGNOSIS — Z90710 Acquired absence of both cervix and uterus: Secondary | ICD-10-CM | POA: Insufficient documentation

## 2019-01-29 DIAGNOSIS — Z7984 Long term (current) use of oral hypoglycemic drugs: Secondary | ICD-10-CM | POA: Insufficient documentation

## 2019-01-29 DIAGNOSIS — Z79899 Other long term (current) drug therapy: Secondary | ICD-10-CM | POA: Diagnosis not present

## 2019-01-29 DIAGNOSIS — N202 Calculus of kidney with calculus of ureter: Secondary | ICD-10-CM | POA: Diagnosis present

## 2019-01-29 DIAGNOSIS — Z9842 Cataract extraction status, left eye: Secondary | ICD-10-CM | POA: Insufficient documentation

## 2019-01-29 DIAGNOSIS — R251 Tremor, unspecified: Secondary | ICD-10-CM | POA: Diagnosis not present

## 2019-01-29 DIAGNOSIS — Z8249 Family history of ischemic heart disease and other diseases of the circulatory system: Secondary | ICD-10-CM | POA: Diagnosis not present

## 2019-01-29 DIAGNOSIS — R05 Cough: Secondary | ICD-10-CM | POA: Diagnosis not present

## 2019-01-29 DIAGNOSIS — K219 Gastro-esophageal reflux disease without esophagitis: Secondary | ICD-10-CM | POA: Insufficient documentation

## 2019-01-29 DIAGNOSIS — Z6831 Body mass index (BMI) 31.0-31.9, adult: Secondary | ICD-10-CM | POA: Insufficient documentation

## 2019-01-29 DIAGNOSIS — E669 Obesity, unspecified: Secondary | ICD-10-CM | POA: Diagnosis not present

## 2019-01-29 DIAGNOSIS — N139 Obstructive and reflux uropathy, unspecified: Secondary | ICD-10-CM

## 2019-01-29 DIAGNOSIS — N201 Calculus of ureter: Secondary | ICD-10-CM

## 2019-01-29 DIAGNOSIS — Z9660 Presence of unspecified orthopedic joint implant: Secondary | ICD-10-CM | POA: Diagnosis not present

## 2019-01-29 DIAGNOSIS — E119 Type 2 diabetes mellitus without complications: Secondary | ICD-10-CM | POA: Diagnosis not present

## 2019-01-29 HISTORY — PX: CYSTOSCOPY WITH URETEROSCOPY, STONE BASKETRY AND STENT PLACEMENT: SHX6378

## 2019-01-29 LAB — GLUCOSE, CAPILLARY
Glucose-Capillary: 122 mg/dL — ABNORMAL HIGH (ref 70–99)
Glucose-Capillary: 121 mg/dL — ABNORMAL HIGH (ref 70–99)

## 2019-01-29 SURGERY — CYSTOSCOPY, WITH CALCULUS MANIPULATION OR REMOVAL
Anesthesia: General | Laterality: Right

## 2019-01-29 MED ORDER — DEXAMETHASONE SODIUM PHOSPHATE 10 MG/ML IJ SOLN
INTRAMUSCULAR | Status: DC | PRN
  Administered 2019-01-29: 10 mg via INTRAVENOUS

## 2019-01-29 MED ORDER — FENTANYL CITRATE (PF) 100 MCG/2ML IJ SOLN
INTRAMUSCULAR | Status: DC | PRN
  Administered 2019-01-29 (×2): 25 ug via INTRAVENOUS

## 2019-01-29 MED ORDER — FAMOTIDINE 20 MG PO TABS
20.0000 mg | ORAL_TABLET | Freq: Once | ORAL | Status: AC
  Administered 2019-01-29: 20 mg via ORAL

## 2019-01-29 MED ORDER — EPHEDRINE SULFATE 50 MG/ML IJ SOLN
INTRAMUSCULAR | Status: AC
  Filled 2019-01-29: qty 1

## 2019-01-29 MED ORDER — SUCCINYLCHOLINE CHLORIDE 20 MG/ML IJ SOLN
INTRAMUSCULAR | Status: AC
  Filled 2019-01-29: qty 1

## 2019-01-29 MED ORDER — HYDROCODONE-ACETAMINOPHEN 5-325 MG PO TABS: 1.0000 | ORAL_TABLET | Freq: Four times a day (QID) | ORAL | 0 refills | Status: DC | PRN

## 2019-01-29 MED ORDER — FAMOTIDINE 20 MG PO TABS
ORAL_TABLET | ORAL | Status: AC
  Administered 2019-01-29: 07:00:00 20 mg via ORAL
  Filled 2019-01-29: qty 1

## 2019-01-29 MED ORDER — DEXAMETHASONE SODIUM PHOSPHATE 10 MG/ML IJ SOLN
INTRAMUSCULAR | Status: AC
  Filled 2019-01-29: qty 1

## 2019-01-29 MED ORDER — LIDOCAINE HCL (PF) 2 % IJ SOLN
INTRAMUSCULAR | Status: AC
  Filled 2019-01-29: qty 10

## 2019-01-29 MED ORDER — MIDAZOLAM HCL 2 MG/2ML IJ SOLN
INTRAMUSCULAR | Status: AC
  Filled 2019-01-29: qty 2

## 2019-01-29 MED ORDER — IOPAMIDOL (ISOVUE-200) INJECTION 41%
INTRAVENOUS | Status: DC | PRN
  Administered 2019-01-29: 10 mL

## 2019-01-29 MED ORDER — ONDANSETRON HCL 4 MG/2ML IJ SOLN
INTRAMUSCULAR | Status: AC
  Filled 2019-01-29: qty 2

## 2019-01-29 MED ORDER — FENTANYL CITRATE (PF) 100 MCG/2ML IJ SOLN
INTRAMUSCULAR | Status: AC
  Filled 2019-01-29: qty 2

## 2019-01-29 MED ORDER — PROPOFOL 10 MG/ML IV BOLUS
INTRAVENOUS | Status: DC | PRN
  Administered 2019-01-29: 150 mg via INTRAVENOUS

## 2019-01-29 MED ORDER — ONDANSETRON HCL 4 MG/2ML IJ SOLN
INTRAMUSCULAR | Status: DC | PRN
  Administered 2019-01-29: 4 mg via INTRAVENOUS

## 2019-01-29 MED ORDER — GLYCOPYRROLATE 0.2 MG/ML IJ SOLN
INTRAMUSCULAR | Status: AC
  Filled 2019-01-29: qty 1

## 2019-01-29 MED ORDER — MIDAZOLAM HCL 2 MG/2ML IJ SOLN
INTRAMUSCULAR | Status: DC | PRN
  Administered 2019-01-29: 2 mg via INTRAVENOUS

## 2019-01-29 MED ORDER — FENTANYL CITRATE (PF) 100 MCG/2ML IJ SOLN: 25.0000 ug | INTRAMUSCULAR | Status: DC | PRN

## 2019-01-29 MED ORDER — MEPERIDINE HCL 50 MG/ML IJ SOLN: 6.2500 mg | INTRAMUSCULAR | Status: DC | PRN

## 2019-01-29 MED ORDER — GLYCOPYRROLATE 0.2 MG/ML IJ SOLN
INTRAMUSCULAR | Status: DC | PRN
  Administered 2019-01-29: 0.2 mg via INTRAVENOUS

## 2019-01-29 MED ORDER — LEVOFLOXACIN IN D5W 500 MG/100ML IV SOLN
INTRAVENOUS | Status: AC
  Filled 2019-01-29: qty 100

## 2019-01-29 MED ORDER — PROPOFOL 10 MG/ML IV BOLUS
INTRAVENOUS | Status: AC
  Filled 2019-01-29: qty 20

## 2019-01-29 MED ORDER — PHENYLEPHRINE HCL (PRESSORS) 10 MG/ML IV SOLN
INTRAVENOUS | Status: AC
  Filled 2019-01-29: qty 1

## 2019-01-29 MED ORDER — LIDOCAINE HCL (CARDIAC) PF 100 MG/5ML IV SOSY
PREFILLED_SYRINGE | INTRAVENOUS | Status: DC | PRN
  Administered 2019-01-29: 80 mg via INTRAVENOUS

## 2019-01-29 MED ORDER — OXYCODONE HCL 5 MG PO TABS: 5.0000 mg | ORAL_TABLET | Freq: Once | ORAL | Status: DC | PRN

## 2019-01-29 MED ORDER — PROMETHAZINE HCL 25 MG/ML IJ SOLN: 6.2500 mg | INTRAMUSCULAR | Status: DC | PRN

## 2019-01-29 MED ORDER — OXYCODONE HCL 5 MG/5ML PO SOLN: 5.0000 mg | Freq: Once | ORAL | Status: DC | PRN

## 2019-01-29 MED ORDER — SODIUM CHLORIDE 0.9 % IV SOLN
INTRAVENOUS | Status: DC
  Administered 2019-01-29: 07:00:00 via INTRAVENOUS

## 2019-01-29 MED ORDER — ACETAMINOPHEN 10 MG/ML IV SOLN
INTRAVENOUS | Status: DC | PRN
  Administered 2019-01-29: 1000 mg via INTRAVENOUS

## 2019-01-29 SURGICAL SUPPLY — 27 items
BAG DRAIN CYSTO-URO LG1000N (MISCELLANEOUS) ×3 IMPLANT
BASKET ZERO TIP 1.9FR (BASKET) ×2 IMPLANT
BRUSH SCRUB EZ 1% IODOPHOR (MISCELLANEOUS) ×3 IMPLANT
CATH URETL 5X70 OPEN END (CATHETERS) IMPLANT
CNTNR SPEC 2.5X3XGRAD LEK (MISCELLANEOUS) ×1
CONT SPEC 4OZ STER OR WHT (MISCELLANEOUS) ×2
CONTAINER SPEC 2.5X3XGRAD LEK (MISCELLANEOUS) IMPLANT
DRAPE UTILITY 15X26 TOWEL STRL (DRAPES) ×3 IMPLANT
FIBER LASER LITHO 273 (Laser) IMPLANT
GLOVE BIO SURGEON STRL SZ8 (GLOVE) ×3 IMPLANT
GOWN STRL REUS W/ TWL LRG LVL3 (GOWN DISPOSABLE) ×1 IMPLANT
GOWN STRL REUS W/ TWL XL LVL3 (GOWN DISPOSABLE) ×1 IMPLANT
GOWN STRL REUS W/TWL LRG LVL3 (GOWN DISPOSABLE) ×2
GOWN STRL REUS W/TWL XL LVL3 (GOWN DISPOSABLE) ×2
GUIDEWIRE STR DUAL SENSOR (WIRE) ×5 IMPLANT
INFUSOR MANOMETER BAG 3000ML (MISCELLANEOUS) ×3 IMPLANT
INTRODUCER DILATOR DOUBLE (INTRODUCER) IMPLANT
KIT TURNOVER CYSTO (KITS) ×3 IMPLANT
PACK CYSTO AR (MISCELLANEOUS) ×3 IMPLANT
SET CYSTO W/LG BORE CLAMP LF (SET/KITS/TRAYS/PACK) ×3 IMPLANT
SHEATH URETERAL 12FRX35CM (MISCELLANEOUS) IMPLANT
SOL .9 NS 3000ML IRR  AL (IV SOLUTION) ×2
SOL .9 NS 3000ML IRR UROMATIC (IV SOLUTION) ×1 IMPLANT
STENT URET 6FRX24 CONTOUR (STENTS) ×2 IMPLANT
STENT URET 6FRX26 CONTOUR (STENTS) IMPLANT
SURGILUBE 2OZ TUBE FLIPTOP (MISCELLANEOUS) ×3 IMPLANT
WATER STERILE IRR 1000ML POUR (IV SOLUTION) ×3 IMPLANT

## 2019-01-29 NOTE — Anesthesia Preprocedure Evaluation (Signed)
Anesthesia Evaluation  Patient identified by MRN, date of birth, ID band Patient awake    Reviewed: Allergy & Precautions, NPO status , Patient's Chart, lab work & pertinent test results  History of Anesthesia Complications Negative for: history of anesthetic complications  Airway Mallampati: I  TM Distance: >3 FB Neck ROM: Full    Dental no notable dental hx.    Pulmonary sleep apnea (does not wear CPAP because she doesn't like it) , neg COPD,    breath sounds clear to auscultation- rhonchi (-) wheezing      Cardiovascular hypertension, Pt. on medications (-) CAD, (-) Past MI, (-) Cardiac Stents and (-) CABG  Rhythm:Regular Rate:Normal - Systolic murmurs and - Diastolic murmurs    Neuro/Psych neg Seizures negative neurological ROS  negative psych ROS   GI/Hepatic Neg liver ROS, GERD  ,  Endo/Other  diabetes, Oral Hypoglycemic Agents  Renal/GU negative Renal ROS     Musculoskeletal negative musculoskeletal ROS (+)   Abdominal (+) + obese,   Peds  Hematology negative hematology ROS (+)   Anesthesia Other Findings Past Medical History: No date: Cough     Comment:  CHRONIC No date: Diabetes mellitus without complication (HCC) No date: GERD (gastroesophageal reflux disease) No date: Hypertension No date: Sleep apnea     Comment:  no cpap No date: Tremor     Comment:  HEAD   Reproductive/Obstetrics                             Anesthesia Physical Anesthesia Plan  ASA: II  Anesthesia Plan: General   Post-op Pain Management:    Induction: Intravenous  PONV Risk Score and Plan: 2 and Ondansetron, Midazolam and Dexamethasone  Airway Management Planned: Oral ETT  Additional Equipment:   Intra-op Plan:   Post-operative Plan: Extubation in OR  Informed Consent: I have reviewed the patients History and Physical, chart, labs and discussed the procedure including the risks, benefits  and alternatives for the proposed anesthesia with the patient or authorized representative who has indicated his/her understanding and acceptance.     Dental advisory given  Plan Discussed with: CRNA and Anesthesiologist  Anesthesia Plan Comments:         Anesthesia Quick Evaluation

## 2019-01-29 NOTE — Op Note (Signed)
Preoperative diagnosis: Right ureteral calculi  Postoperative diagnosis:  1. Right mid ureteral calculus  2.  Right nephrolithiasis  Procedure:  1. Cystoscopy 2. Right ureteroscopy and stone removal 3. Right ureteral stent placement (6FR) 24 cm 4. Right retrograde pyelography with interpretation  Surgeon: Lorin Picket C. Albany Winslow, M.D.  Anesthesia: General  Complications: None  Intraoperative findings:  1.  Right retrograde pyelography post procedure showed no filling defects, stone fragments or contrast extravasation  EBL: Minimal  Specimens: 1. Calculi for analysis   Indication: Cynthia Lucas is a 68 y.o. year old female who is status post right ureteral stent placement on 01/07/2023 obstructing right ureteral calculi with UTI.  CT showed a 4 mm proximal ureteral calculus and 4 mm mid ureteral calculus.  She is having moderate to severe stent symptoms and presents for definitive treatment.  After reviewing the management options for treatment, the patient elected to proceed with the above surgical procedure(s). We have discussed the potential benefits and risks of the procedure, side effects of the proposed treatment, the likelihood of the patient achieving the goals of the procedure, and any potential problems that might occur during the procedure or recuperation. Informed consent has been obtained.  Description of procedure:  The patient was taken to the operating room and general anesthesia was induced.  The patient was placed in the dorsal lithotomy position, prepped and draped in the usual sterile fashion, and preoperative antibiotics were administered. A preoperative time-out was performed.   A 21 French cystoscope was lubricated and passed per urethra.  Panendoscopy was performed and the bladder mucosa showed no erythema, solid or papillary lesions with the exception of inflammatory changes on the right hemi-trigone secondary to her indwelling stent.  Attention then turned to the  right ureteral orifice and the indwelling ureteral stent was grasped with endoscopic forceps and brought out through the urethral meatus.  A 0.038 Sensor wire was then advanced through the stent into the renal pelvis under fluoroscopic guidance.  The stent was then removed and a 4.5 Fr semirigid ureteroscope was then advanced into the ureter next to the guidewire up to the region of the UPJ.  A proximal calculus was not identified.  The ureteroscope was slowly withdrawn and the 4 mm calculus was identified in the mid ureter.  The calculus was grasped with a 1.9 [place] nitinol basket and removed without difficulty.  The semirigid ureteroscope was repassed and a second sensor wire was placed.  The semirigid ureteroscope was removed and a single channel digital flexible ureteroscope was placed over the working wire into the mid ureter.  The working wire was removed and the ureteroscope was advanced proximally.  No stones identified in the ureter.  The stone was identified in a lower pole calyx.  It was placed in the basket and removed without difficulty.  The flexible ureteroscope was repassed and advanced to the renal pelvis.  Retrograde pyelogram was performed with findings as described above.  All calyces were examined and no additional calculi were identified.  In addition there were no additional calculi seen on CT.  The ureteroscope was removed.  A 6 French/24 cm double-J ureteral stent was placed over the wire under fluoroscopic guidance.  There was good curl seen in the renal pelvis and bladder on fluoroscopy.  The bladder was then emptied and the procedure ended.  The patient appeared to tolerate the procedure well and without complications.  After anesthetic reversal the patient was transported to the PACU in stable condition.   The  stent was left attached to a tether and the patient was instructed to remove on Thursday, 01/31/2019.   Irineo AxonScott Lorayne Getchell, MD

## 2019-01-29 NOTE — Progress Notes (Signed)
Patient in PACU. As we were unable to visualize stent string, I notified Dr. Lonna Cobb who advised if patient is unable to find string on Thursday (day of stent removal), she should notify the office. Patient verbalized understanding and is agreeable.

## 2019-01-29 NOTE — Anesthesia Procedure Notes (Signed)
Procedure Name: LMA Insertion Date/Time: 01/29/2019 7:36 AM Performed by: Stormy Fabian, CRNA Pre-anesthesia Checklist: Patient identified, Patient being monitored, Timeout performed, Emergency Drugs available and Suction available Patient Re-evaluated:Patient Re-evaluated prior to induction Oxygen Delivery Method: Circle system utilized Preoxygenation: Pre-oxygenation with 100% oxygen Induction Type: IV induction Ventilation: Mask ventilation without difficulty LMA: LMA inserted LMA Size: 3.5 Tube type: Oral Number of attempts: 1 Placement Confirmation: positive ETCO2 and breath sounds checked- equal and bilateral Tube secured with: Tape Dental Injury: Teeth and Oropharynx as per pre-operative assessment

## 2019-01-29 NOTE — Transfer of Care (Signed)
Immediate Anesthesia Transfer of Care Note  [name]  Procedure(s) Performed: Procedure(s): CYSTOSCOPY/URETEROSCOPY/HOLMIUM LASER/STENT EXCHANGE (Right)  Patient Location: PACU  Anesthesia Type:General  Level of Consciousness: sedated  Airway & Oxygen Therapy: Patient Spontanous Breathing and Patient connected to face mask oxygen  Post-op Assessment: Report given to RN and Post -op Vital signs reviewed and stable  Post vital signs: Reviewed and stable  Last Vitals:  Vitals:   01/29/19 0650 01/29/19 0824  BP: 119/72 110/67  Pulse: 61 66  Resp: 18 10  Temp: (!) 35.7 C (!) 36.4 C  SpO2: 100% 100%    Complications: No apparent anesthesia complications

## 2019-01-29 NOTE — Discharge Instructions (Signed)
AMBULATORY SURGERY  DISCHARGE INSTRUCTIONS   1) The drugs that you were given will stay in your system until tomorrow so for the next 24 hours you should not:  A) Drive an automobile B) Make any legal decisions C) Drink any alcoholic beverage   2) You may resume regular meals tomorrow.  Today it is better to start with liquids and gradually work up to solid foods.  You may eat anything you prefer, but it is better to start with liquids, then soup and crackers, and gradually work up to solid foods.   3) Please notify your doctor immediately if you have any unusual bleeding, trouble breathing, redness and pain at the surgery site, drainage, fever, or pain not relieved by medication.    4) Additional Instructions:        Please contact your physician with any problems or Same Day Surgery at [phone], Monday through Friday 6 am to 4 pm, or [facility] number at [phone].DISCHARGE INSTRUCTIONS FOR KIDNEY STONE/URETERAL STENT   MEDICATIONS:  1. Resume all your other meds from home.  2.  AZO (over-the-counter) can help with the burning/stinging when you urinate. 3.  Hydrocodone is for moderate/severe pain, Rx was sent to your pharmacy.  ACTIVITY:  1. May resume regular activities in 24 hours. 2. No driving while on narcotic pain medications  3. Drink plenty of water  4. Continue to walk at home - you can still get blood clots when you are at home, so keep active, but don't over do it.  5. May return to work/school tomorrow or when you feel ready   BATHING:  1. You can shower. 2. You have a string coming from your urethra: The stent string is attached to your ureteral stent. Do not pull on this.   SIGNS/SYMPTOMS TO CALL:  Please call us if you have a fever greater than 101.5, uncontrolled nausea/vomiting, uncontrolled pain, dizziness, unable to urinate, excessively bloody urine, chest pain, shortness of breath, leg swelling, leg pain, or any other  concerns or questions.   Urinary frequency, urgency and blood in the urine will be normal.  You can reach [place] at [phone].   FOLLOW-UP:  1. You will be contacted by our office regarding a follow-up appointment. 2. You have a string attached to your stent, you may remove it on Thursday, 01/31/2019. To do this, pull the string until the stent is completely removed. You may feel an odd sensation in your back.  If you have any questions please contact our office

## 2019-01-29 NOTE — Anesthesia Post-op Follow-up Note (Signed)
Anesthesia QCDR form completed.        

## 2019-01-29 NOTE — Interval H&P Note (Signed)
History and Physical Interval Note:  01/29/2019 7:22 AM  Cynthia Lucas  has presented today for surgery, with the diagnosis of right ureteral calculi.  The various methods of treatment have been discussed with the patient and family. After consideration of risks, benefits and other options for treatment, the patient has consented to  Procedure(s): CYSTOSCOPY/URETEROSCOPY/HOLMIUM LASER/STENT EXCHANGE (Right) as a surgical intervention.  The patient's history has been reviewed, patient examined, no change in status, stable for surgery.  I have reviewed the patient's chart and labs.  Questions were answered to the patient's satisfaction.     Scott C Stoioff

## 2019-01-29 NOTE — Anesthesia Postprocedure Evaluation (Signed)
Anesthesia Post Note  [name]  Procedure(s) Performed: CYSTOSCOPY/URETEROSCOPY/HOLMIUM LASER/STENT EXCHANGE (Right )  Patient location during evaluation: PACU Anesthesia Type: General Level of consciousness: awake and alert and oriented Pain management: pain level controlled Vital Signs Assessment: post-procedure vital signs reviewed and stable Respiratory status: spontaneous breathing, nonlabored ventilation and respiratory function stable Cardiovascular status: blood pressure returned to baseline and stable Postop Assessment: no signs of nausea or vomiting Anesthetic complications: no     Last Vitals:  Vitals:   01/29/19 0942 01/29/19 1028  BP: 122/77 121/73  Pulse: (!) 55 62  Resp: 13 16  Temp:    SpO2: 99% 100%    Last Pain:  Vitals:   01/29/19 1028  TempSrc:   PainSc: 0-No pain                 Haruka Kowaleski

## 2019-01-30 ENCOUNTER — Encounter: Payer: Self-pay | Admitting: Urology

## 2019-02-05 LAB — CALCULI, WITH PHOTOGRAPH (CLINICAL LAB)
Uric Acid Calculi: 100 %
Weight Calculi: 72 mg

## 2019-03-04 ENCOUNTER — Ambulatory Visit: Payer: Medicare Other | Admitting: Urology

## 2019-05-12 ENCOUNTER — Telehealth: Payer: Self-pay | Admitting: Urology

## 2019-05-12 NOTE — Telephone Encounter (Signed)
Cancel postop appointment on 6/1 and did not reschedule.  Recommend scheduling routine follow-up appointment

## 2019-05-13 NOTE — Telephone Encounter (Signed)
She did schedule a follow up   Sharyn Lull

## 2019-05-17 ENCOUNTER — Encounter: Payer: Self-pay | Admitting: Urology

## 2019-05-17 ENCOUNTER — Ambulatory Visit (INDEPENDENT_AMBULATORY_CARE_PROVIDER_SITE_OTHER): Payer: Medicare Other | Admitting: Urology

## 2019-05-17 ENCOUNTER — Other Ambulatory Visit: Payer: Self-pay

## 2019-05-17 VITALS — BP 137/91 | HR 63 | Ht 67.0 in | Wt 200.0 lb

## 2019-05-17 DIAGNOSIS — N209 Urinary calculus, unspecified: Secondary | ICD-10-CM | POA: Diagnosis not present

## 2019-05-17 NOTE — Progress Notes (Signed)
05/17/2019 1:18 PM   Cynthia KeySharon Lucas [birth year] [id number]  Referring provider: Toy CookeyHeadrick, Emily, FNP [address] [place] [id number]  Chief Complaint  Patient presents with  . Follow-up    HPI: 68 y.o. female presents for postop follow-up.  She underwent ureteroscopic removal of a 4 mm right mid ureteral and right lower pole renal calculi on 01/29/2019.  She removed her stent on 01/31/2019 without problems.  She canceled her postop follow-up and was contacted to reschedule.  She has no complaints.  Stone analysis was 100% uric acid  PMH: Past Medical History:  Diagnosis Date  . Cough    CHRONIC  . Diabetes mellitus without complication (HCC)   . GERD (gastroesophageal reflux disease)   . Hypertension   . Sleep apnea    no cpap  . Tremor    HEAD    Surgical History: Past Surgical History:  Procedure Laterality Date  . ABDOMINAL HYSTERECTOMY    . CATARACT EXTRACTION W/PHACO Left 03/07/2018   Procedure: CATARACT EXTRACTION PHACO AND INTRAOCULAR LENS PLACEMENT (IOC);  Surgeon: Galen ManilaPorfilio, William, MD;  Location: [facility];  Service: Ophthalmology;  Laterality: Left;  Lot # [id number] H US: 04:19.8 AP%: 19.9 CDE: 51.70  . CESAREAN SECTION    . COLONOSCOPY    . CYSTOSCOPY W/ RETROGRADES Right 01/07/2019   Procedure: CYSTOSCOPY WITH RETROGRADE PYELOGRAM;  Surgeon: Riki AltesStoioff, Khanh Tanori C, MD;  Location: [facility];  Service: Urology;  Laterality: Right;  . CYSTOSCOPY WITH STENT PLACEMENT Right 01/07/2019   Procedure: CYSTOSCOPY WITH STENT PLACEMENT;  Surgeon: Riki AltesStoioff, Nashton Belson C, MD;  Location: [facility];  Service: Urology;  Laterality: Right;  . CYSTOSCOPY WITH URETEROSCOPY, STONE BASKETRY AND STENT PLACEMENT Right 01/29/2019   Procedure: CYSTOSCOPY/URETEROSCOPY/HOLMIUM LASER/STENT EXCHANGE;  Surgeon: Riki AltesStoioff, Jackalynn Art C, MD;  Location: [facility];  Service: Urology;  Laterality: Right;  . JOINT REPLACEMENT      Home Medications:  Allergies as of 05/17/2019   No Known Allergies     Medication  List       Accurate as of May 17, 2019  1:18 PM. If you have any questions, ask your nurse or doctor.        STOP taking these medications   HYDROcodone-acetaminophen 5-325 MG tablet Commonly known as: NORCO/VICODIN Stopped by: Riki AltesScott C Cortlyn Cannell, MD     TAKE these medications   Accu-Chek Aviva Plus test strip Generic drug: glucose blood   atorvastatin 40 MG tablet Commonly known as: LIPITOR Take 40 mg by mouth at bedtime.   Combigan 0.2-0.5 % ophthalmic solution Generic drug: brimonidine-timolol Place 1 drop into both eyes every 12 (twelve) hours.   Hair Skin and Nails Formula Tabs Take 1 tablet by mouth 3 (three) times daily.   hydrochlorothiazide 25 MG tablet Commonly known as: HYDRODIURIL Take 25 mg by mouth daily.   Lumigan 0.01 % Soln Generic drug: bimatoprost Place 1 drop into the left eye at bedtime.   metFORMIN 500 MG tablet Commonly known as: GLUCOPHAGE Take 1 tablet (500 mg total) by mouth 2 (two) times daily with a meal.   Simbrinza 1-0.2 % Susp Generic drug: Brinzolamide-Brimonidine Place 1 drop into the left eye 2 (two) times a day.       Allergies: No Known Allergies  Family History: Family History  Problem Relation Age of Onset  . Heart disease Mother     Social History:  reports that she has never smoked. She has never used smokeless tobacco. She reports current alcohol use. She reports that she does  not use drugs.  ROS: No significant change 01/22/2019 except as noted in the HPI  Physical Exam: BP (!) 137/91 (BP Location: Left Arm, Patient Position: Sitting, Cuff Size: Normal)   Pulse 63   Ht 5\' 7"  (1.702 m)   Wt 200 lb (90.7 kg)   BMI 31.32 kg/m   Constitutional:  Alert and oriented, No acute distress. HEENT: [place] AT, moist mucus membranes.  Trachea midline, no masses. Cardiovascular: No clubbing, cyanosis, or edema. Respiratory: Normal respiratory effort, no increased work of breathing. GI: Abdomen is soft, nontender,  nondistended, no abdominal masses GU: No CVA tenderness Lymph: No cervical or inguinal lymphadenopathy. Skin: No rashes, bruises or suspicious lesions. Neurologic: Grossly intact, no focal deficits, moving all 4 extremities. Psychiatric: Normal mood and affect.   Assessment & Plan:    - Uric acid stone disease Doing well status post ureteroscopic removal of right ureteral and renal calculi.  Stone analysis was 100% uric acid and have recommended proceeding with a metabolic evaluation.  Follow-up 2 months to review results.  Abbie Sons, [place] [address] [place], [place] [address] [phone]

## 2019-05-30 ENCOUNTER — Other Ambulatory Visit: Payer: Medicare Other

## 2019-05-30 ENCOUNTER — Other Ambulatory Visit: Payer: Self-pay

## 2019-07-10 ENCOUNTER — Other Ambulatory Visit: Payer: Self-pay | Admitting: Urology

## 2019-07-18 ENCOUNTER — Encounter: Payer: Self-pay | Admitting: Urology

## 2019-07-18 ENCOUNTER — Ambulatory Visit: Payer: Medicare Other | Admitting: Urology

## 2020-05-15 ENCOUNTER — Other Ambulatory Visit: Payer: Self-pay | Admitting: Family Medicine

## 2020-05-15 DIAGNOSIS — Z1231 Encounter for screening mammogram for malignant neoplasm of breast: Secondary | ICD-10-CM

## 2020-05-15 DIAGNOSIS — Z13820 Encounter for screening for osteoporosis: Secondary | ICD-10-CM

## 2020-07-01 ENCOUNTER — Encounter: Payer: Self-pay | Admitting: Ophthalmology

## 2020-07-01 ENCOUNTER — Other Ambulatory Visit: Payer: Self-pay

## 2020-07-03 ENCOUNTER — Other Ambulatory Visit: Payer: Self-pay

## 2020-07-03 ENCOUNTER — Other Ambulatory Visit
Admission: RE | Admit: 2020-07-03 | Discharge: 2020-07-03 | Disposition: A | Discharge status: Home / Self Care | Payer: Medicare Other | Source: Ambulatory Visit | Attending: Ophthalmology | Admitting: Ophthalmology

## 2020-07-03 DIAGNOSIS — Z01812 Encounter for preprocedural laboratory examination: Secondary | ICD-10-CM | POA: Insufficient documentation

## 2020-07-03 DIAGNOSIS — Z20822 Contact with and (suspected) exposure to covid-19: Secondary | ICD-10-CM | POA: Insufficient documentation

## 2020-07-03 NOTE — Discharge Instructions (Signed)

## 2020-07-04 LAB — SARS CORONAVIRUS 2 (TAT 6-24 HRS): SARS Coronavirus 2: NEGATIVE

## 2020-07-07 ENCOUNTER — Encounter: Payer: Self-pay | Admitting: Ophthalmology

## 2020-07-07 ENCOUNTER — Other Ambulatory Visit: Payer: Self-pay

## 2020-07-07 ENCOUNTER — Ambulatory Visit
Admission: RE | Admit: 2020-07-07 | Discharge: 2020-07-07 | Disposition: A | Discharge status: Home / Self Care | Payer: Medicare Other | Attending: Ophthalmology | Admitting: Ophthalmology

## 2020-07-07 ENCOUNTER — Ambulatory Visit: Payer: Medicare Other | Admitting: Anesthesiology

## 2020-07-07 ENCOUNTER — Encounter
Admission: RE | Disposition: A | Discharge status: Home / Self Care | Payer: Self-pay | Source: Home / Self Care | Attending: Ophthalmology

## 2020-07-07 DIAGNOSIS — H2511 Age-related nuclear cataract, right eye: Secondary | ICD-10-CM | POA: Diagnosis not present

## 2020-07-07 DIAGNOSIS — Z8249 Family history of ischemic heart disease and other diseases of the circulatory system: Secondary | ICD-10-CM | POA: Insufficient documentation

## 2020-07-07 DIAGNOSIS — Z79899 Other long term (current) drug therapy: Secondary | ICD-10-CM | POA: Insufficient documentation

## 2020-07-07 DIAGNOSIS — Z7984 Long term (current) use of oral hypoglycemic drugs: Secondary | ICD-10-CM | POA: Diagnosis not present

## 2020-07-07 DIAGNOSIS — I10 Essential (primary) hypertension: Secondary | ICD-10-CM | POA: Diagnosis not present

## 2020-07-07 DIAGNOSIS — G4730 Sleep apnea, unspecified: Secondary | ICD-10-CM | POA: Diagnosis not present

## 2020-07-07 DIAGNOSIS — E1136 Type 2 diabetes mellitus with diabetic cataract: Secondary | ICD-10-CM | POA: Insufficient documentation

## 2020-07-07 DIAGNOSIS — Z90710 Acquired absence of both cervix and uterus: Secondary | ICD-10-CM | POA: Diagnosis not present

## 2020-07-07 DIAGNOSIS — Z9660 Presence of unspecified orthopedic joint implant: Secondary | ICD-10-CM | POA: Insufficient documentation

## 2020-07-07 HISTORY — DX: Personal history of urinary calculi: Z87.442

## 2020-07-07 HISTORY — PX: CATARACT EXTRACTION W/PHACO: SHX586

## 2020-07-07 LAB — GLUCOSE, CAPILLARY
Glucose-Capillary: 143 mg/dL — ABNORMAL HIGH (ref 70–99)
Glucose-Capillary: 148 mg/dL — ABNORMAL HIGH (ref 70–99)

## 2020-07-07 SURGERY — PHACOEMULSIFICATION, CATARACT, WITH IOL INSERTION
Anesthesia: Monitor Anesthesia Care | Site: Eye | Laterality: Right

## 2020-07-07 MED ORDER — LIDOCAINE HCL (PF) 2 % IJ SOLN
INTRAOCULAR | Status: DC | PRN
  Administered 2020-07-07: 2 mL

## 2020-07-07 MED ORDER — TETRACAINE HCL 0.5 % OP SOLN
1.0000 [drp] | OPHTHALMIC | Status: DC | PRN
  Administered 2020-07-07 (×3): 1 [drp] via OPHTHALMIC

## 2020-07-07 MED ORDER — MIDAZOLAM HCL 2 MG/2ML IJ SOLN
INTRAMUSCULAR | Status: DC | PRN
  Administered 2020-07-07 (×2): 1 mg via INTRAVENOUS

## 2020-07-07 MED ORDER — FENTANYL CITRATE (PF) 100 MCG/2ML IJ SOLN
INTRAMUSCULAR | Status: DC | PRN
  Administered 2020-07-07: 50 ug via INTRAVENOUS

## 2020-07-07 MED ORDER — ARMC OPHTHALMIC DILATING DROPS
1.0000 "application " | OPHTHALMIC | Status: DC | PRN
  Administered 2020-07-07 (×3): 1 via OPHTHALMIC

## 2020-07-07 MED ORDER — BRIMONIDINE TARTRATE-TIMOLOL 0.2-0.5 % OP SOLN
OPHTHALMIC | Status: DC | PRN
  Administered 2020-07-07: 1 [drp] via OPHTHALMIC

## 2020-07-07 MED ORDER — EPINEPHRINE PF 1 MG/ML IJ SOLN
INTRAOCULAR | Status: DC | PRN
  Administered 2020-07-07: 77 mL via OPHTHALMIC

## 2020-07-07 MED ORDER — NA CHONDROIT SULF-NA HYALURON 40-17 MG/ML IO SOLN
INTRAOCULAR | Status: DC | PRN
  Administered 2020-07-07: 1 mL via INTRAOCULAR

## 2020-07-07 MED ORDER — MOXIFLOXACIN HCL 0.5 % OP SOLN
OPHTHALMIC | Status: DC | PRN
  Administered 2020-07-07: 0.2 mL via OPHTHALMIC

## 2020-07-07 SURGICAL SUPPLY — 20 items
CANNULA ANT/CHMB 27G (MISCELLANEOUS) ×2 IMPLANT
CANNULA ANT/CHMB 27GA (MISCELLANEOUS) ×6 IMPLANT
GLOVE SURG LX 8.0 MICRO (GLOVE) ×2
GLOVE SURG LX STRL 8.0 MICRO (GLOVE) ×1 IMPLANT
GLOVE SURG TRIUMPH 8.0 PF LTX (GLOVE) ×3 IMPLANT
GOWN STRL REUS W/ TWL LRG LVL3 (GOWN DISPOSABLE) ×2 IMPLANT
GOWN STRL REUS W/TWL LRG LVL3 (GOWN DISPOSABLE) ×6
LENS IOL DIOP 13.5 (Intraocular Lens) ×3 IMPLANT
LENS IOL TECNIS MONO 13.5 (Intraocular Lens) IMPLANT
MARKER SKIN DUAL TIP RULER LAB (MISCELLANEOUS) ×3 IMPLANT
NDL FILTER BLUNT 18X1 1/2 (NEEDLE) ×1 IMPLANT
NEEDLE FILTER BLUNT 18X 1/2SAF (NEEDLE) ×2
NEEDLE FILTER BLUNT 18X1 1/2 (NEEDLE) ×1 IMPLANT
PACK EYE AFTER SURG (MISCELLANEOUS) ×3 IMPLANT
PACK OPTHALMIC (MISCELLANEOUS) ×3 IMPLANT
PACK PORFILIO (MISCELLANEOUS) ×3 IMPLANT
SYR 3ML LL SCALE MARK (SYRINGE) ×3 IMPLANT
SYR TB 1ML LUER SLIP (SYRINGE) ×3 IMPLANT
WATER STERILE IRR 250ML POUR (IV SOLUTION) ×3 IMPLANT
WIPE NON LINTING 3.25X3.25 (MISCELLANEOUS) ×3 IMPLANT

## 2020-07-07 NOTE — Anesthesia Preprocedure Evaluation (Signed)
Anesthesia Evaluation  Patient identified by MRN, date of birth, ID band Patient awake    Reviewed: Allergy & Precautions, NPO status   Airway Mallampati: II  TM Distance: >3 FB     Dental   Pulmonary sleep apnea ,    breath sounds clear to auscultation       Cardiovascular hypertension,  Rhythm:Regular Rate:Normal     Neuro/Psych    GI/Hepatic GERD  ,  Endo/Other  diabetes, Type 2  Renal/GU      Musculoskeletal   Abdominal   Peds  Hematology   Anesthesia Other Findings   Reproductive/Obstetrics                             Anesthesia Physical Anesthesia Plan  ASA: III  Anesthesia Plan: MAC   Post-op Pain Management:    Induction: Intravenous  PONV Risk Score and Plan: TIVA, Midazolam and Treatment may vary due to age or medical condition  Airway Management Planned: Natural Airway and Nasal Cannula  Additional Equipment:   Intra-op Plan:   Post-operative Plan:   Informed Consent: I have reviewed the patients History and Physical, chart, labs and discussed the procedure including the risks, benefits and alternatives for the proposed anesthesia with the patient or authorized representative who has indicated his/her understanding and acceptance.       Plan Discussed with: CRNA  Anesthesia Plan Comments:         Anesthesia Quick Evaluation

## 2020-07-07 NOTE — Anesthesia Postprocedure Evaluation (Signed)
Anesthesia Post Note  [name]  Procedure(s) Performed: CATARACT EXTRACTION PHACO AND INTRAOCULAR LENS PLACEMENT (IOC) RIGHT DIABETIC 7.75 00:42.8 (Right Eye)     Patient location during evaluation: PACU Anesthesia Type: MAC Level of consciousness: awake Pain management: pain level controlled Vital Signs Assessment: post-procedure vital signs reviewed and stable Respiratory status: respiratory function stable Cardiovascular status: stable Postop Assessment: no apparent nausea or vomiting Anesthetic complications: no   No complications documented.  Veda Canning

## 2020-07-07 NOTE — Transfer of Care (Signed)
Immediate Anesthesia Transfer of Care Note  [name]  Procedure(s) Performed: CATARACT EXTRACTION PHACO AND INTRAOCULAR LENS PLACEMENT (IOC) RIGHT DIABETIC 7.75 00:42.8 (Right Eye)  Patient Location: PACU  Anesthesia Type: MAC  Level of Consciousness: awake, alert  and patient cooperative  Airway and Oxygen Therapy: Patient Spontanous Breathing and Patient connected to supplemental oxygen  Post-op Assessment: Post-op Vital signs reviewed, Patient's Cardiovascular Status Stable, Respiratory Function Stable, Patent Airway and No signs of Nausea or vomiting  Post-op Vital Signs: Reviewed and stable  Complications: No complications documented.

## 2020-07-07 NOTE — Anesthesia Procedure Notes (Signed)
Procedure Name: MAC Date/Time: 07/07/2020 10:12 AM Performed by: Cameron Ali, CRNA Pre-anesthesia Checklist: Patient identified, Emergency Drugs available, Suction available, Timeout performed and Patient being monitored Patient Re-evaluated:Patient Re-evaluated prior to induction Oxygen Delivery Method: Nasal cannula Placement Confirmation: positive ETCO2

## 2020-07-07 NOTE — Op Note (Signed)
PREOPERATIVE DIAGNOSIS:  Nuclear sclerotic cataract of the right eye.   POSTOPERATIVE DIAGNOSIS:  Cataract   OPERATIVE PROCEDURE:@   SURGEON:  Galen Manila, MD.   ANESTHESIA:  Anesthesiologist: Jola Babinski, MD CRNA: Maree Krabbe, CRNA  1.      Managed anesthesia care. 2.      0.41ml of Shugarcaine was instilled in the eye following the paracentesis.   COMPLICATIONS:  None.   TECHNIQUE:   Stop and chop   DESCRIPTION OF PROCEDURE:  The patient was examined and consented in the preoperative holding area where the aforementioned topical anesthesia was applied to the right eye and then brought back to the Operating Room where the right eye was prepped and draped in the usual sterile ophthalmic fashion and a lid speculum was placed. A paracentesis was created with the side port blade and the anterior chamber was filled with viscoelastic. A near clear corneal incision was performed with the steel keratome. A continuous curvilinear capsulorrhexis was performed with a cystotome followed by the capsulorrhexis forceps. Hydrodissection and hydrodelineation were carried out with BSS on a blunt cannula. The lens was removed in a stop and chop  technique and the remaining cortical material was removed with the irrigation-aspiration handpiece. The capsular bag was inflated with viscoelastic and the Technis [id number]  lens was placed in the capsular bag without complication. The remaining viscoelastic was removed from the eye with the irrigation-aspiration handpiece. The wounds were hydrated. The anterior chamber was flushed with BSS and the eye was inflated to physiologic pressure. 0.41ml of Vigamox was placed in the anterior chamber. The wounds were found to be water tight. The eye was dressed with Combigan. The patient was given protective glasses to wear throughout the day and a shield with which to sleep tonight. The patient was also given drops with which to begin a drop regimen today and will follow-up  with me in one day. Implant Name Type Inv. Item Serial No. Manufacturer Lot No. LRB No. Used Action  LENS IOL DIOP 13.5 - [id number] Intraocular Lens LENS IOL DIOP 13.5 [id number] JOHNSON   Right 1 Implanted   Procedure(s) with comments: CATARACT EXTRACTION PHACO AND INTRAOCULAR LENS PLACEMENT (IOC) RIGHT DIABETIC 7.75 00:42.8 (Right) - diabetic - oral meds  Electronically signed: Galen Manila 07/07/2020 10:32 AM

## 2020-07-07 NOTE — H&P (Signed)
All labs reviewed. Abnormal studies sent to patients PCP when indicated.  Previous H&P reviewed, patient examined, there are NO CHANGES.  Cynthia Laneve Porfilio10/5/202110:09 AM

## 2020-07-07 NOTE — Consult Note (Signed)
[facility]   Primary Care Physician:  Toy Cookey, FNP Ophthalmologist: Dr. Druscilla Brownie  Pre-Procedure History & Physical: HPI:  Cynthia Lucas is a 69 y.o. female here for cataract surgery.   Past Medical History:  Diagnosis Date  . Cough    CHRONIC  . Diabetes mellitus without complication (HCC)   . GERD (gastroesophageal reflux disease)   . History of kidney stones   . Hypertension   . Sleep apnea    no cpap  . Tremor    HEAD    Past Surgical History:  Procedure Laterality Date  . ABDOMINAL HYSTERECTOMY    . CATARACT EXTRACTION W/PHACO Left 03/07/2018   Procedure: CATARACT EXTRACTION PHACO AND INTRAOCULAR LENS PLACEMENT (IOC);  Surgeon: Galen Manila, MD;  Location: [facility];  Service: Ophthalmology;  Laterality: Left;  Lot # [id number] H [place]: 04:19.8 AP%: 19.9 CDE: 51.70  . CESAREAN SECTION    . COLONOSCOPY    . CYSTOSCOPY W/ RETROGRADES Right 01/07/2019   Procedure: CYSTOSCOPY WITH RETROGRADE PYELOGRAM;  Surgeon: Riki Altes, MD;  Location: [facility];  Service: Urology;  Laterality: Right;  . CYSTOSCOPY WITH STENT PLACEMENT Right 01/07/2019   Procedure: CYSTOSCOPY WITH STENT PLACEMENT;  Surgeon: Riki Altes, MD;  Location: [facility];  Service: Urology;  Laterality: Right;  . CYSTOSCOPY WITH URETEROSCOPY, STONE BASKETRY AND STENT PLACEMENT Right 01/29/2019   Procedure: CYSTOSCOPY/URETEROSCOPY/HOLMIUM LASER/STENT EXCHANGE;  Surgeon: Riki Altes, MD;  Location: [facility];  Service: Urology;  Laterality: Right;  . JOINT REPLACEMENT      Prior to Admission medications   Medication Sig Start Date End Date Taking? Authorizing Provider  ACCU-CHEK AVIVA PLUS test strip  04/24/19  Yes [provider]  ACETAZOLAMIDE PO Take by mouth 2 (two) times daily.   Yes [provider]  atorvastatin (LIPITOR) 40 MG tablet Take 40 mg by mouth at bedtime.    Yes [provider]  brimonidine-timolol (COMBIGAN) 0.2-0.5 % ophthalmic solution Place 1 drop  into both eyes every 12 (twelve) hours.   Yes [provider]  hydrochlorothiazide (HYDRODIURIL) 25 MG tablet Take 25 mg by mouth daily.  01/17/19  Yes [provider]  LUMIGAN 0.01 % SOLN Place 1 drop into the left eye at bedtime.  01/17/19  Yes [provider]  metFORMIN (GLUCOPHAGE) 500 MG tablet Take 1 tablet (500 mg total) by mouth 2 (two) times daily with a meal. 01/08/19 07/01/20 Yes Enid Baas, MD    Allergies as of 06/15/2020  . (No Known Allergies)    Family History  Problem Relation Age of Onset  . Heart disease Mother     Social History   Socioeconomic History  . Marital status: Divorced    Spouse name: Not on file  . Number of children: Not on file  . Years of education: Not on file  . Highest education level: Not on file  Occupational History  . Not on file  Tobacco Use  . Smoking status: Never Smoker  . Smokeless tobacco: Never Used  Vaping Use  . Vaping Use: Never used  Substance and Sexual Activity  . Alcohol use: Yes    Comment: OCCAS  . Drug use: Never  . Sexual activity: Yes    Birth control/protection: None  Other Topics Concern  . Not on file  Social History Narrative  . Not on file   Social Determinants of Health   Financial Resource Strain:   . Difficulty of Paying Living Expenses: Not on file  Food Insecurity:   .  Worried About Programme researcher, broadcasting/film/video in the Last Year: Not on file  . Ran Out of Food in the Last Year: Not on file  Transportation Needs:   . Lack of Transportation (Medical): Not on file  . Lack of Transportation (Non-Medical): Not on file  Physical Activity:   . Days of Exercise per Week: Not on file  . Minutes of Exercise per Session: Not on file  Stress:   . Feeling of Stress : Not on file  Social Connections:   . Frequency of Communication with Friends and Family: Not on file  . Frequency of Social Gatherings with Friends and Family: Not on file  . Attends Religious Services: Not on file  .  Active Member of Clubs or Organizations: Not on file  . Attends Banker Meetings: Not on file  . Marital Status: Not on file  Intimate Partner Violence:   . Fear of Current or Ex-Partner: Not on file  . Emotionally Abused: Not on file  . Physically Abused: Not on file  . Sexually Abused: Not on file    Review of Systems: See HPI, otherwise negative ROS  Physical Exam: BP (!) 150/77   Pulse (!) 58   Temp (!) 97.5 F (36.4 C) (Temporal)   Ht 5\' 7"  (1.702 m)   Wt 98 kg   SpO2 100%   BMI 33.83 kg/m  General:   Alert,  pleasant and cooperative in NAD Head:  Normocephalic and atraumatic. Lungs:  Clear to auscultation.    Heart:  Regular rate and rhythm.   Impression/Plan: Cynthia Lucas is here for cataract surgery.  Risks, benefits, limitations, and alternatives regarding cataract surgery have been reviewed with the patient.  Questions have been answered.  All parties agreeable.   [name], MD  07/07/2020, 10:03 AM

## 2020-07-08 ENCOUNTER — Encounter: Payer: Self-pay | Admitting: Ophthalmology

## 2020-12-27 ENCOUNTER — Other Ambulatory Visit: Payer: Self-pay

## 2020-12-27 ENCOUNTER — Encounter: Payer: Self-pay | Admitting: Intensive Care

## 2020-12-27 ENCOUNTER — Inpatient Hospital Stay
Admission: EM | Admit: 2020-12-27 | Discharge: 2021-01-03 | DRG: 853 | Disposition: A | Discharge status: Home / Self Care | Payer: Medicare Other | Attending: Hospitalist | Admitting: Hospitalist

## 2020-12-27 ENCOUNTER — Emergency Department: Payer: Medicare Other

## 2020-12-27 DIAGNOSIS — K219 Gastro-esophageal reflux disease without esophagitis: Secondary | ICD-10-CM | POA: Diagnosis present

## 2020-12-27 DIAGNOSIS — E860 Dehydration: Secondary | ICD-10-CM | POA: Diagnosis present

## 2020-12-27 DIAGNOSIS — K529 Noninfective gastroenteritis and colitis, unspecified: Secondary | ICD-10-CM | POA: Diagnosis present

## 2020-12-27 DIAGNOSIS — N189 Chronic kidney disease, unspecified: Secondary | ICD-10-CM | POA: Diagnosis not present

## 2020-12-27 DIAGNOSIS — E872 Acidosis, unspecified: Secondary | ICD-10-CM

## 2020-12-27 DIAGNOSIS — E876 Hypokalemia: Secondary | ICD-10-CM

## 2020-12-27 DIAGNOSIS — G4733 Obstructive sleep apnea (adult) (pediatric): Secondary | ICD-10-CM | POA: Diagnosis present

## 2020-12-27 DIAGNOSIS — E871 Hypo-osmolality and hyponatremia: Secondary | ICD-10-CM | POA: Diagnosis present

## 2020-12-27 DIAGNOSIS — E1122 Type 2 diabetes mellitus with diabetic chronic kidney disease: Secondary | ICD-10-CM | POA: Diagnosis present

## 2020-12-27 DIAGNOSIS — I129 Hypertensive chronic kidney disease with stage 1 through stage 4 chronic kidney disease, or unspecified chronic kidney disease: Secondary | ICD-10-CM | POA: Diagnosis present

## 2020-12-27 DIAGNOSIS — D509 Iron deficiency anemia, unspecified: Secondary | ICD-10-CM | POA: Diagnosis present

## 2020-12-27 DIAGNOSIS — H409 Unspecified glaucoma: Secondary | ICD-10-CM | POA: Diagnosis present

## 2020-12-27 DIAGNOSIS — G9341 Metabolic encephalopathy: Secondary | ICD-10-CM | POA: Diagnosis present

## 2020-12-27 DIAGNOSIS — N136 Pyonephrosis: Secondary | ICD-10-CM | POA: Diagnosis present

## 2020-12-27 DIAGNOSIS — Z01818 Encounter for other preprocedural examination: Secondary | ICD-10-CM | POA: Diagnosis not present

## 2020-12-27 DIAGNOSIS — N3030 Trigonitis without hematuria: Secondary | ICD-10-CM | POA: Diagnosis present

## 2020-12-27 DIAGNOSIS — I10 Essential (primary) hypertension: Secondary | ICD-10-CM | POA: Diagnosis not present

## 2020-12-27 DIAGNOSIS — N200 Calculus of kidney: Secondary | ICD-10-CM

## 2020-12-27 DIAGNOSIS — A419 Sepsis, unspecified organism: Principal | ICD-10-CM | POA: Diagnosis present

## 2020-12-27 DIAGNOSIS — N179 Acute kidney failure, unspecified: Secondary | ICD-10-CM | POA: Diagnosis not present

## 2020-12-27 DIAGNOSIS — N1831 Chronic kidney disease, stage 3a: Secondary | ICD-10-CM | POA: Diagnosis present

## 2020-12-27 DIAGNOSIS — Z8249 Family history of ischemic heart disease and other diseases of the circulatory system: Secondary | ICD-10-CM

## 2020-12-27 DIAGNOSIS — N1330 Unspecified hydronephrosis: Secondary | ICD-10-CM

## 2020-12-27 DIAGNOSIS — K449 Diaphragmatic hernia without obstruction or gangrene: Secondary | ICD-10-CM | POA: Diagnosis present

## 2020-12-27 DIAGNOSIS — N170 Acute kidney failure with tubular necrosis: Secondary | ICD-10-CM | POA: Diagnosis present

## 2020-12-27 DIAGNOSIS — R6520 Severe sepsis without septic shock: Secondary | ICD-10-CM

## 2020-12-27 DIAGNOSIS — D62 Acute posthemorrhagic anemia: Secondary | ICD-10-CM | POA: Diagnosis present

## 2020-12-27 DIAGNOSIS — K922 Gastrointestinal hemorrhage, unspecified: Secondary | ICD-10-CM | POA: Diagnosis present

## 2020-12-27 DIAGNOSIS — R718 Other abnormality of red blood cells: Secondary | ICD-10-CM | POA: Diagnosis not present

## 2020-12-27 DIAGNOSIS — K3189 Other diseases of stomach and duodenum: Secondary | ICD-10-CM | POA: Diagnosis present

## 2020-12-27 DIAGNOSIS — Z87442 Personal history of urinary calculi: Secondary | ICD-10-CM

## 2020-12-27 DIAGNOSIS — N201 Calculus of ureter: Secondary | ICD-10-CM | POA: Diagnosis not present

## 2020-12-27 DIAGNOSIS — R109 Unspecified abdominal pain: Secondary | ICD-10-CM

## 2020-12-27 DIAGNOSIS — R778 Other specified abnormalities of plasma proteins: Secondary | ICD-10-CM | POA: Diagnosis not present

## 2020-12-27 DIAGNOSIS — E1169 Type 2 diabetes mellitus with other specified complication: Secondary | ICD-10-CM | POA: Diagnosis not present

## 2020-12-27 DIAGNOSIS — I4819 Other persistent atrial fibrillation: Secondary | ICD-10-CM | POA: Diagnosis present

## 2020-12-27 DIAGNOSIS — Z7984 Long term (current) use of oral hypoglycemic drugs: Secondary | ICD-10-CM

## 2020-12-27 DIAGNOSIS — E538 Deficiency of other specified B group vitamins: Secondary | ICD-10-CM | POA: Diagnosis not present

## 2020-12-27 DIAGNOSIS — Z79899 Other long term (current) drug therapy: Secondary | ICD-10-CM

## 2020-12-27 DIAGNOSIS — E8770 Fluid overload, unspecified: Secondary | ICD-10-CM | POA: Diagnosis not present

## 2020-12-27 DIAGNOSIS — R053 Chronic cough: Secondary | ICD-10-CM | POA: Diagnosis present

## 2020-12-27 DIAGNOSIS — Z20822 Contact with and (suspected) exposure to covid-19: Secondary | ICD-10-CM | POA: Diagnosis present

## 2020-12-27 DIAGNOSIS — N130 Hydronephrosis with ureteropelvic junction obstruction: Secondary | ICD-10-CM | POA: Diagnosis not present

## 2020-12-27 DIAGNOSIS — A4151 Sepsis due to Escherichia coli [E. coli]: Secondary | ICD-10-CM | POA: Diagnosis not present

## 2020-12-27 DIAGNOSIS — D631 Anemia in chronic kidney disease: Secondary | ICD-10-CM | POA: Diagnosis present

## 2020-12-27 DIAGNOSIS — E1165 Type 2 diabetes mellitus with hyperglycemia: Secondary | ICD-10-CM

## 2020-12-27 DIAGNOSIS — N111 Chronic obstructive pyelonephritis: Secondary | ICD-10-CM | POA: Diagnosis not present

## 2020-12-27 DIAGNOSIS — I4891 Unspecified atrial fibrillation: Secondary | ICD-10-CM | POA: Diagnosis not present

## 2020-12-27 DIAGNOSIS — I959 Hypotension, unspecified: Secondary | ICD-10-CM | POA: Diagnosis present

## 2020-12-27 DIAGNOSIS — N10 Acute tubulo-interstitial nephritis: Secondary | ICD-10-CM | POA: Diagnosis not present

## 2020-12-27 DIAGNOSIS — Z90710 Acquired absence of both cervix and uterus: Secondary | ICD-10-CM

## 2020-12-27 DIAGNOSIS — N171 Acute kidney failure with acute cortical necrosis: Secondary | ICD-10-CM | POA: Diagnosis not present

## 2020-12-27 LAB — COMPREHENSIVE METABOLIC PANEL
ALT: 21 U/L (ref 0–44)
AST: 38 U/L (ref 15–41)
Albumin: 3.3 g/dL — ABNORMAL LOW (ref 3.5–5.0)
Alkaline Phosphatase: 58 U/L (ref 38–126)
Anion gap: 16 — ABNORMAL HIGH (ref 5–15)
BUN: 73 mg/dL — ABNORMAL HIGH (ref 8–23)
CO2: 21 mmol/L — ABNORMAL LOW (ref 22–32)
Calcium: 8.3 mg/dL — ABNORMAL LOW (ref 8.9–10.3)
Chloride: 100 mmol/L (ref 98–111)
Creatinine, Ser: 6.56 mg/dL — ABNORMAL HIGH (ref 0.44–1.00)
GFR, Estimated: 6 mL/min — ABNORMAL LOW (ref >60)
Glucose, Bld: 441 mg/dL — ABNORMAL HIGH (ref 70–99)
Potassium: 2.9 mmol/L — ABNORMAL LOW (ref 3.5–5.1)
Sodium: 137 mmol/L (ref 135–145)
Total Bilirubin: 0.7 mg/dL (ref 0.3–1.2)
Total Protein: 7.9 g/dL (ref 6.5–8.1)

## 2020-12-27 LAB — CBC WITH DIFFERENTIAL/PLATELET
Abs Immature Granulocytes: 0.05 10*3/uL (ref 0.00–0.07)
Basophils Absolute: 0 10*3/uL (ref 0.0–0.1)
Basophils Relative: 0 %
Eosinophils Absolute: 0 10*3/uL (ref 0.0–0.5)
Eosinophils Relative: 0 %
HCT: 41.5 % (ref 36.0–46.0)
Hemoglobin: 13.7 g/dL (ref 12.0–15.0)
Immature Granulocytes: 0 %
Lymphocytes Relative: 6 %
Lymphs Abs: 0.7 10*3/uL (ref 0.7–4.0)
MCH: 26.1 pg (ref 26.0–34.0)
MCHC: 33 g/dL (ref 30.0–36.0)
MCV: 79.2 fL — ABNORMAL LOW (ref 80.0–100.0)
Monocytes Absolute: 0.6 10*3/uL (ref 0.1–1.0)
Monocytes Relative: 5 %
Neutro Abs: 10.9 10*3/uL — ABNORMAL HIGH (ref 1.7–7.7)
Neutrophils Relative %: 89 %
Platelets: 237 10*3/uL (ref 150–400)
RBC: 5.24 MIL/uL — ABNORMAL HIGH (ref 3.87–5.11)
RDW: 15.6 % — ABNORMAL HIGH (ref 11.5–15.5)
WBC: 12.3 10*3/uL — ABNORMAL HIGH (ref 4.0–10.5)
nRBC: 0 % (ref 0.0–0.2)

## 2020-12-27 LAB — TROPONIN I (HIGH SENSITIVITY)
Troponin I (High Sensitivity): 241 ng/L (ref <18)
Troponin I (High Sensitivity): 224 ng/L (ref <18)

## 2020-12-27 LAB — RESP PANEL BY RT-PCR (FLU A&B, COVID) ARPGX2
Influenza A by PCR: NEGATIVE
Influenza B by PCR: NEGATIVE
SARS Coronavirus 2 by RT PCR: NEGATIVE

## 2020-12-27 LAB — APTT: aPTT: 35 seconds (ref 24–36)

## 2020-12-27 LAB — TYPE AND SCREEN
ABO/RH(D): B NEG
Antibody Screen: NEGATIVE

## 2020-12-27 LAB — PROTIME-INR
INR: 1.2 (ref 0.8–1.2)
Prothrombin Time: 14.3 seconds (ref 11.4–15.2)

## 2020-12-27 LAB — MAGNESIUM
Magnesium: 2.2 mg/dL (ref 1.7–2.4)
Magnesium: 2.1 mg/dL (ref 1.7–2.4)

## 2020-12-27 LAB — BRAIN NATRIURETIC PEPTIDE: B Natriuretic Peptide: 530.7 pg/mL — ABNORMAL HIGH (ref 0.0–100.0)

## 2020-12-27 LAB — TSH: TSH: 1.858 u[IU]/mL (ref 0.350–4.500)

## 2020-12-27 LAB — T4, FREE: Free T4: 0.92 ng/dL (ref 0.61–1.12)

## 2020-12-27 MED ORDER — ONDANSETRON HCL 4 MG PO TABS
4.0000 mg | ORAL_TABLET | Freq: Four times a day (QID) | ORAL | Status: DC | PRN
  Filled 2020-12-27: qty 1

## 2020-12-27 MED ORDER — SODIUM CHLORIDE 0.9 % IV SOLN
8.0000 mg/h | INTRAVENOUS | Status: AC
  Administered 2020-12-27 – 2020-12-30 (×5): 8 mg/h via INTRAVENOUS
  Filled 2020-12-27 (×5): qty 80

## 2020-12-27 MED ORDER — ONDANSETRON HCL 4 MG/2ML IJ SOLN: 4.0000 mg | Freq: Four times a day (QID) | INTRAMUSCULAR | Status: DC | PRN

## 2020-12-27 MED ORDER — PANTOPRAZOLE SODIUM 40 MG IV SOLR
40.0000 mg | Freq: Two times a day (BID) | INTRAVENOUS | Status: DC
  Administered 2020-12-31 – 2021-01-02 (×5): 40 mg via INTRAVENOUS
  Filled 2020-12-27 (×5): qty 40

## 2020-12-27 MED ORDER — TRAZODONE HCL 50 MG PO TABS
25.0000 mg | ORAL_TABLET | Freq: Every evening | ORAL | Status: DC | PRN
  Administered 2020-12-30: 25 mg via ORAL
  Filled 2020-12-27 (×2): qty 1

## 2020-12-27 MED ORDER — POTASSIUM CHLORIDE 20 MEQ PO PACK
40.0000 meq | PACK | Freq: Once | ORAL | Status: DC
  Filled 2020-12-27: qty 2

## 2020-12-27 MED ORDER — DILTIAZEM HCL-DEXTROSE 125-5 MG/125ML-% IV SOLN (PREMIX)
5.0000 mg/h | INTRAVENOUS | Status: DC
  Administered 2020-12-27 – 2020-12-28 (×2): 5 mg/h via INTRAVENOUS
  Filled 2020-12-27 (×4): qty 125

## 2020-12-27 MED ORDER — SODIUM CHLORIDE 0.9 % IV SOLN: INTRAVENOUS | Status: DC

## 2020-12-27 MED ORDER — HYDROCHLOROTHIAZIDE 25 MG PO TABS
25.0000 mg | ORAL_TABLET | Freq: Every day | ORAL | Status: DC
Start: 2020-12-28 — End: 2020-12-28
  Administered 2020-12-28: 25 mg via ORAL
  Filled 2020-12-27 (×2): qty 1

## 2020-12-27 MED ORDER — SODIUM CHLORIDE 0.9 % IV SOLN
80.0000 mg | Freq: Once | INTRAVENOUS | Status: AC
  Administered 2020-12-27: 80 mg via INTRAVENOUS
  Filled 2020-12-27: qty 80

## 2020-12-27 MED ORDER — DILTIAZEM HCL 60 MG PO TABS
30.0000 mg | ORAL_TABLET | Freq: Once | ORAL | Status: AC
  Administered 2020-12-27: 30 mg via ORAL
  Filled 2020-12-27: qty 1

## 2020-12-27 MED ORDER — DILTIAZEM HCL 25 MG/5ML IV SOLN
10.0000 mg | Freq: Once | INTRAVENOUS | Status: AC
  Administered 2020-12-27: 10 mg via INTRAVENOUS
  Filled 2020-12-27: qty 5

## 2020-12-27 MED ORDER — ACETAZOLAMIDE 250 MG PO TABS: 250.0000 mg | ORAL_TABLET | Freq: Two times a day (BID) | ORAL | Status: DC

## 2020-12-27 MED ORDER — LATANOPROST 0.005 % OP SOLN
1.0000 [drp] | Freq: Every day | OPHTHALMIC | Status: DC
  Administered 2020-12-28 – 2021-01-02 (×7): 1 [drp] via OPHTHALMIC
  Filled 2020-12-27: qty 2.5

## 2020-12-27 MED ORDER — ACETAMINOPHEN 650 MG RE SUPP: 650.0000 mg | Freq: Four times a day (QID) | RECTAL | Status: DC | PRN

## 2020-12-27 MED ORDER — SODIUM CHLORIDE 0.9 % IV BOLUS
1000.0000 mL | Freq: Once | INTRAVENOUS | Status: AC
  Administered 2020-12-27: 1000 mL via INTRAVENOUS

## 2020-12-27 MED ORDER — BRIMONIDINE TARTRATE-TIMOLOL 0.2-0.5 % OP SOLN
1.0000 [drp] | Freq: Two times a day (BID) | OPHTHALMIC | Status: DC
  Filled 2020-12-27: qty 5

## 2020-12-27 MED ORDER — ATORVASTATIN CALCIUM 20 MG PO TABS
40.0000 mg | ORAL_TABLET | Freq: Every day | ORAL | Status: DC
  Administered 2020-12-28 – 2021-01-02 (×7): 40 mg via ORAL
  Filled 2020-12-27 (×7): qty 2

## 2020-12-27 MED ORDER — ACETAMINOPHEN 325 MG PO TABS
650.0000 mg | ORAL_TABLET | Freq: Four times a day (QID) | ORAL | Status: DC | PRN
  Administered 2020-12-30 – 2021-01-01 (×2): 650 mg via ORAL
  Filled 2020-12-27 (×2): qty 2

## 2020-12-27 MED ORDER — POTASSIUM CHLORIDE 20 MEQ PO PACK
40.0000 meq | PACK | Freq: Once | ORAL | Status: AC
  Administered 2020-12-28: 40 meq via ORAL

## 2020-12-27 MED ORDER — SODIUM CHLORIDE 0.9 % IV BOLUS
500.0000 mL | Freq: Once | INTRAVENOUS | Status: AC
  Administered 2020-12-27: 500 mL via INTRAVENOUS

## 2020-12-27 NOTE — ED Triage Notes (Signed)
Arrived by EMS from home with c/o N/V/D. Patient Reports coffee ground emesis at home. Patient in A-fib RVR upon arrival

## 2020-12-27 NOTE — ED Notes (Signed)
Bladder scan 86mL. Dr. Fuller Plan notified.

## 2020-12-27 NOTE — ED Notes (Signed)
Pt c/o left shoulder pain starting at this time, repeat EKG preformed and given to Dr. Fuller Plan. Pt describes pain as dull, non-radiating.

## 2020-12-27 NOTE — H&P (Addendum)
Cynthia Lucas   PATIENT NAME: Cynthia Lucas    MR#:  [id number]  DATE OF BIRTH:  [birth year]  DATE OF ADMISSION:  12/27/2020  PRIMARY CARE PHYSICIAN: Toy Cookey, FNP   Patient is coming from: Home  REQUESTING/REFERRING PHYSICIAN: Artis Delay, MD  CHIEF COMPLAINT:   Chief Complaint  Patient presents with  . Weakness  . Atrial Fibrillation    HISTORY OF PRESENT ILLNESS:  Cynthia Lucas is a 70 y.o. African-American female female with medical history significant for hypertension, GERD, type 2 diabetes mellitus and sleep apnea as well as head tremors, who presented to the emergency room with acute onset of recurrent nausea and vomiting with diarrhea.  She admits to coffee-ground emesis and denied any bright red bleeding per rectum or melena.  She denied any fever or chills.  She admitted to lower abdominal pain feeling like cramps with bowel movements.  She has been having palpitations without chest pain.  She admitted to headache without dizziness or blurred vision she has also been having urinary frequency and urgency without dysuria or hematuria or flank pain.  No other bleeding diathesis.  She denied any cough or wheezing or hemoptysis.  ED Course: When she came to the ER heart rate was 138 with a blood pressure of 97/58 with respiratory to of 27 and otherwise normal vital signs.  Heart rate has been as high as 149.  Labs revealed hypokalemia of 2.9 and elevated blood glucose of 441 with a BUN of 73 and creatinine 6.56, magnesium 2.2 anion gap of 16.  BNP was 530.7 and high-sensitivity troponin I was 241 and later 224.  CBC showed leukocytosis of 12.3 with neutrophilia.  TSH came back 1.85 and free T4 0.92.  Influenza antigens and COVID-19 PCR came back negative.  Stool pathogens is currently pending.  She had brown stools on rectal exam by the ER physician and that came back with positive Hemoccult.  EKG as reviewed by me : Showed atrial fibrillation with a rate of 157 with  borderline right axis deviation Imaging: Portable chest ray showed no acute cardiopulmonary disease.  The patient was given 10 mg of IV diltiazem was initially ordered 30 mg of p.o. Cardizem and then IV Cardizem drip.  She was also given IV Protonix bolus and drip and 1.5 L of IV normal saline bolus with improvement of her blood pressure to 120/68.  She will be admitted to a progressive cardiac unit bed for further evaluation and management.Marland Kitchen PAST MEDICAL HISTORY:   Past Medical History:  Diagnosis Date  . Cough    CHRONIC  . Diabetes mellitus without complication (HCC)   . GERD (gastroesophageal reflux disease)   . History of kidney stones   . Hypertension   . Sleep apnea    no cpap  . Tremor    HEAD    PAST SURGICAL HISTORY:   Past Surgical History:  Procedure Laterality Date  . ABDOMINAL HYSTERECTOMY    . CATARACT EXTRACTION W/PHACO Left 03/07/2018   Procedure: CATARACT EXTRACTION PHACO AND INTRAOCULAR LENS PLACEMENT (IOC);  Surgeon: Galen Manila, MD;  Location: [facility];  Service: Ophthalmology;  Laterality: Left;  Lot # [id number] H [place]: 04:19.8 AP%: 19.9 CDE: 51.70  . CATARACT EXTRACTION W/PHACO Right 07/07/2020   Procedure: CATARACT EXTRACTION PHACO AND INTRAOCULAR LENS PLACEMENT (IOC) RIGHT DIABETIC 7.75 00:42.8;  Surgeon: Galen Manila, MD;  Location: [facility];  Service: Ophthalmology;  Laterality: Right;  diabetic - oral meds  . CESAREAN SECTION    .  COLONOSCOPY    . CYSTOSCOPY W/ RETROGRADES Right 01/07/2019   Procedure: CYSTOSCOPY WITH RETROGRADE PYELOGRAM;  Surgeon: Riki Altes, MD;  Location: [facility];  Service: Urology;  Laterality: Right;  . CYSTOSCOPY WITH STENT PLACEMENT Right 01/07/2019   Procedure: CYSTOSCOPY WITH STENT PLACEMENT;  Surgeon: Riki Altes, MD;  Location: [facility];  Service: Urology;  Laterality: Right;  . CYSTOSCOPY WITH URETEROSCOPY, STONE BASKETRY AND STENT PLACEMENT Right 01/29/2019   Procedure:  CYSTOSCOPY/URETEROSCOPY/HOLMIUM LASER/STENT EXCHANGE;  Surgeon: Riki Altes, MD;  Location: [facility];  Service: Urology;  Laterality: Right;  . JOINT REPLACEMENT      SOCIAL HISTORY:   Social History   Tobacco Use  . Smoking status: Never Smoker  . Smokeless tobacco: Never Used  Substance Use Topics  . Alcohol use: Yes    Comment: OCCAS    FAMILY HISTORY:   Family History  Problem Relation Age of Onset  . Heart disease Mother     DRUG ALLERGIES:   Allergies  Allergen Reactions  . Other     Contact lense solution causes eye irritation    REVIEW OF SYSTEMS:   ROS As per history of present illness. All pertinent systems were reviewed above. Constitutional, HEENT, cardiovascular, respiratory, GI, GU, musculoskeletal, neuro, psychiatric, endocrine, integumentary and hematologic systems were reviewed and are otherwise negative/unremarkable except for positive findings mentioned above in the HPI.   MEDICATIONS AT HOME:   Prior to Admission medications   Medication Sig Start Date End Date Taking? Authorizing Provider  atorvastatin (LIPITOR) 40 MG tablet Take 40 mg by mouth at bedtime.    Yes [provider]  brimonidine-timolol (COMBIGAN) 0.2-0.5 % ophthalmic solution Place 1 drop into both eyes every 12 (twelve) hours.   Yes [provider]  hydrochlorothiazide (HYDRODIURIL) 25 MG tablet Take 25 mg by mouth daily.  01/17/19  Yes [provider]  LUMIGAN 0.01 % SOLN Place 1 drop into the left eye at bedtime.  01/17/19  Yes [provider]  metFORMIN (GLUCOPHAGE) 500 MG tablet Take 1 tablet (500 mg total) by mouth 2 (two) times daily with a meal. 01/08/19 07/01/20 Yes Enid Baas, MD  ACCU-CHEK AVIVA PLUS test strip  04/24/19   [provider]  ACETAZOLAMIDE PO Take by mouth 2 (two) times daily. Patient not taking: Reported on 12/27/2020    [provider]      VITAL SIGNS:  Blood pressure 104/69, pulse 94,  temperature 99.8 F (37.7 C), temperature source Oral, resp. rate 18, height 5\' 7"  (1.702 m), weight 88.9 kg, SpO2 98 %.  PHYSICAL EXAMINATION:  Physical Exam  GENERAL:  70 y.o.-year-old patient lying in the bed with no acute distress with resting head tremors.  EYES: Pupils equal, round, reactive to light and accommodation. No scleral icterus. Extraocular muscles intact.  HEENT: Head atraumatic, normocephalic. Oropharynx and nasopharynx clear.  NECK:  Supple, no jugular venous distention. No thyroid enlargement, no tenderness.  LUNGS: Normal breath sounds bilaterally, no wheezing, rales,rhonchi or crepitation. No use of accessory muscles of respiration.  CARDIOVASCULAR: Irregular irregular tachycardic rhythm to 120s, S1, S2 normal. No murmurs, rubs, or gallops.  ABDOMEN: Soft, nondistended, nontender. Bowel sounds present. No organomegaly or mass.  EXTREMITIES: No pedal edema, cyanosis, or clubbing.  NEUROLOGIC: Cranial nerves II through XII are intact. Muscle strength 5/5 in all extremities. Sensation intact. Gait not checked.  The patient has resting head tremors.  PSYCHIATRIC: The patient is alert and oriented x 3.  Normal affect and good  eye contact. SKIN: No obvious rash, lesion, or ulcer.   LABORATORY PANEL:   CBC Recent Labs  Lab 12/27/20 1841  WBC 12.3*  HGB 13.7  HCT 41.5  PLT 237   ------------------------------------------------------------------------------------------------------------------  Chemistries  Recent Labs  Lab 12/27/20 1841  NA 137  K 2.9*  CL 100  CO2 21*  GLUCOSE 441*  BUN 73*  CREATININE 6.56*  CALCIUM 8.3*  MG 2.2  AST 38  ALT 21  ALKPHOS 58  BILITOT 0.7   ------------------------------------------------------------------------------------------------------------------  Cardiac Enzymes No results for input(s): TROPONINI in the last 168  hours. ------------------------------------------------------------------------------------------------------------------  RADIOLOGY:  DG Chest Portable 1 View  Result Date: 12/27/2020 CLINICAL DATA:  Shortness of breath EXAM: PORTABLE CHEST 1 VIEW COMPARISON:  None. FINDINGS: Cardiac shadow is within normal limits. Elevation of the right hemidiaphragm is noted. No focal infiltrate or effusion is seen. No acute bony abnormality is noted. IMPRESSION: No acute abnormality seen. Electronically Signed   By: Alcide CleverMark  Lukens M.D.   On: 12/27/2020 19:08      IMPRESSION AND PLAN:  Active Problems:   GI bleeding  1.  GI bleeding, likely of upper GI etiology with associated acute gastroenteritis. -The patient will be admitted to a progressive unit bed. -We will follow serial hemoglobins and hematocrits. -This could be related to acute gastritis/duodenitis, gastric or duodenal ulcers, erosions or AVMs or acute esophagitis. -We will continue IV Protonix drip. -She will be hydrated with IV normal saline. -We will obtain a GI consultation. -I notified Dr. Servando SnareWohl about the patient.  2.  Atrial fibrillation with rapid ventricular response, of new onset. -The patient will be continued on IV Cardizem drip. -The patient's CHA2DS2-VASc score is 4 but she is clearly not a candidate at this time for anticoagulation due to GI bleeding. -We will obtain a cardiology consultation and a 2D echo. -I notified Dr. Elease HashimotoNahser about the patient.  3.  Acute kidney injury superimposed on stage IIIa chronic kidney disease.  This is likely prerenal secondary to volume depletion and dehydration with nausea, vomiting and diarrhea as well as GI bleeding and hypotension. -The patient will be hydrated with IV normal saline. -We will follow serial BMPs.  4.  Uncontrolled type 2 diabetes mellitus with hyperglycemia. -The patient will be placed on supplement coverage with NovoLog and will hold off her Metformin. -She will be  hydrated with IV normal saline.  5.  Glaucoma. -We will continue her ophthalmic gtt.  6.  Essential hypertension. -We will hold off her diuretic therapy at this time due to her GI bleeding.  DVT prophylaxis: SCDs.  Medical prophylaxis currently contraindicated due to GI bleeding. Code Status: full code. Family Communication:  The plan of care was discussed in details with the patient (and the patient has not requested any family member at this time to be notified). I answered all questions. The patient agreed to proceed with the above mentioned plan. Further management will depend upon hospital course. Disposition Plan: Back to previous home environment Consults called: Gastroenterology and cardiology as mentioned above. All the records are reviewed and case discussed with ED provider.  Status is: Inpatient  Remains inpatient appropriate because:Ongoing diagnostic testing needed not appropriate for outpatient work up, Unsafe d/c plan, IV treatments appropriate due to intensity of illness or inability to take PO and Inpatient level of care appropriate due to severity of illness   Dispo: The patient is from: Home              Anticipated  d/c is to: Home              Patient currently is not medically stable to d/c.   Difficult to place patient No   TOTAL TIME TAKING CARE OF THIS PATIENT: 60 minutes.    Hannah Beat M.D on 12/27/2020 at 11:10 PM  [facility]   From 7 PM-7 AM, contact night-coverage [email or web address]  CC: Primary care physician; Toy Cookey, FNP

## 2020-12-27 NOTE — ED Provider Notes (Signed)
[facility] Emergency Department Provider Note  ____________________________________________   Event Date/Time   First MD Initiated Contact with Patient 12/27/20 1825     (approximate)  I have reviewed the triage vital signs and the nursing notes.   HISTORY  Chief Complaint Weakness and Atrial Fibrillation    HPI Cynthia Lucas is a 70 y.o. female with diabetes, hypertension who comes in with weakness.  Patient comes in after having episodes of nausea, vomiting, diarrhea.  This is been going on for a few days, constant, nothing makes it better, nothing makes it worse.  She reports that the stool is dark in nature.  Denies any alcohol use or liver issues.  Patient is reporting some weakness.  Difficulty getting up and moving.  Denies any falls where she is hit in her head.  Denies any history of atrial fibrillation.  Upon EMS arrival patient was in A. fib with RVR.      Past Medical History:  Diagnosis Date  . Cough    CHRONIC  . Diabetes mellitus without complication (HCC)   . GERD (gastroesophageal reflux disease)   . History of kidney stones   . Hypertension   . Sleep apnea    no cpap  . Tremor    HEAD    Patient Active Problem List   Diagnosis Date Noted  . 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 Obstructive uropathy 01/07/2019    Past Surgical History:  Procedure Laterality Date  . ABDOMINAL HYSTERECTOMY    . CATARACT EXTRACTION W/PHACO Left 03/07/2018   Procedure: CATARACT EXTRACTION PHACO AND INTRAOCULAR LENS PLACEMENT (IOC);  Surgeon: Galen Manila, MD;  Location: [facility];  Service: Ophthalmology;  Laterality: Left;  Lot # [id number] H [place]: 04:19.8 AP%: 19.9 CDE: 51.70  . CATARACT EXTRACTION W/PHACO Right 07/07/2020   Procedure: CATARACT EXTRACTION PHACO AND INTRAOCULAR LENS PLACEMENT (IOC) RIGHT DIABETIC 7.75 00:42.8;  Surgeon: Galen Manila, MD;  Location: [facility];  Service: Ophthalmology;  Laterality: Right;  diabetic  - oral meds  . CESAREAN SECTION    . COLONOSCOPY    . CYSTOSCOPY W/ RETROGRADES Right 01/07/2019   Procedure: CYSTOSCOPY WITH RETROGRADE PYELOGRAM;  Surgeon: Riki Altes, MD;  Location: [facility];  Service: Urology;  Laterality: Right;  . CYSTOSCOPY WITH STENT PLACEMENT Right 01/07/2019   Procedure: CYSTOSCOPY WITH STENT PLACEMENT;  Surgeon: Riki Altes, MD;  Location: [facility];  Service: Urology;  Laterality: Right;  . CYSTOSCOPY WITH URETEROSCOPY, STONE BASKETRY AND STENT PLACEMENT Right 01/29/2019   Procedure: CYSTOSCOPY/URETEROSCOPY/HOLMIUM LASER/STENT EXCHANGE;  Surgeon: Riki Altes, MD;  Location: [facility];  Service: Urology;  Laterality: Right;  . JOINT REPLACEMENT      Prior to Admission medications   Medication Sig Start Date End Date Taking? Authorizing Provider  ACCU-CHEK AVIVA PLUS test strip  04/24/19   [provider]  ACETAZOLAMIDE PO Take by mouth 2 (two) times daily.    [provider]  atorvastatin (LIPITOR) 40 MG tablet Take 40 mg by mouth at bedtime.     [provider]  brimonidine-timolol (COMBIGAN) 0.2-0.5 % ophthalmic solution Place 1 drop into both eyes every 12 (twelve) hours.    [provider]  hydrochlorothiazide (HYDRODIURIL) 25 MG tablet Take 25 mg by mouth daily.  01/17/19   [provider]  LUMIGAN 0.01 % SOLN Place 1 drop into the left eye at bedtime.  01/17/19   [provider]  metFORMIN (GLUCOPHAGE) 500 MG tablet Take 1 tablet (500 mg total)  by mouth 2 (two) times daily with a meal. 01/08/19 07/01/20  Enid Baas, MD    Allergies Other  Family History  Problem Relation Age of Onset  . Heart disease Mother     Social History Social History   Tobacco Use  . Smoking status: Never Smoker  . Smokeless tobacco: Never Used  Vaping Use  . Vaping Use: Never used  Substance Use Topics  . Alcohol use: Yes    Comment: OCCAS  . Drug use: Never      Review of Systems Constitutional:  No fever/chills, weakness Eyes: No visual changes. ENT: No sore throat. Cardiovascular: Denies chest pain. Respiratory: Denies shortness of breath. Gastrointestinal: No abdominal pain.  Positive nausea, vomiting, diarrhea no constipation. Genitourinary: Negative for dysuria. Musculoskeletal: Negative for back pain. Skin: Negative for rash. Neurological: Negative for headaches, focal weakness or numbness. All other ROS negative ____________________________________________   PHYSICAL EXAM:  VITAL SIGNS: ED Triage Vitals [12/27/20 1833]  Enc Vitals Group     BP      Pulse Rate (!) 138     Resp      Temp 98.4 F (36.9 C)     Temp Source Oral     SpO2      Weight      Height      Head Circumference      Peak Flow      Pain Score      Pain Loc      Pain Edu?      Excl. in GC?     Constitutional: Alert and oriented.  Appears fatigued Eyes: Conjunctivae are normal. EOMI. Head: Atraumatic. Nose: No congestion/rhinnorhea. Mouth/Throat: Mucous membranes are moist.   Neck: No stridor. Trachea Midline. FROM Cardiovascular: Irregular, tachycardic grossly normal heart sounds.  Good peripheral circulation. Respiratory: Normal respiratory effort.  No retractions. Lungs CTAB. Gastrointestinal: Soft and nontender. No distention. No abdominal bruits.  Musculoskeletal: No lower extremity tenderness nor edema.  No joint effusions. Neurologic:  Normal speech and language. No gross focal neurologic deficits are appreciated.  Skin:  Skin is warm, dry and intact. No rash noted. Psychiatric: Mood and affect are normal. Speech and behavior are normal. GU: Deferred   ____________________________________________   LABS (all labs ordered are listed, but only abnormal results are displayed)  Labs Reviewed  CBC WITH DIFFERENTIAL/PLATELET - Abnormal; Notable for the following components:      Result Value   WBC 12.3 (*)    RBC 5.24 (*)    MCV 79.2 (*)    RDW 15.6 (*)    Neutro Abs 10.9  (*)    All other components within normal limits  COMPREHENSIVE METABOLIC PANEL - Abnormal; Notable for the following components:   Potassium 2.9 (*)    CO2 21 (*)    Glucose, Bld 441 (*)    BUN 73 (*)    Creatinine, Ser 6.56 (*)    Calcium 8.3 (*)    Albumin 3.3 (*)    GFR, Estimated 6 (*)    Anion gap 16 (*)    All other components within normal limits  BRAIN NATRIURETIC PEPTIDE - Abnormal; Notable for the following components:   B Natriuretic Peptide 530.7 (*)    All other components within normal limits  TROPONIN I (HIGH SENSITIVITY) - Abnormal; Notable for the following components:   Troponin I (High Sensitivity) 241 (*)    All other components within normal limits  TROPONIN I (HIGH SENSITIVITY) - Abnormal; Notable for the following  components:   Troponin I (High Sensitivity) 224 (*)    All other components within normal limits  RESP PANEL BY RT-PCR (FLU A&B, COVID) ARPGX2  GASTROINTESTINAL PANEL BY PCR, STOOL (REPLACES STOOL CULTURE)  C DIFFICILE QUICK SCREEN W PCR REFLEX  TSH  T4, FREE  MAGNESIUM  PROTIME-INR  APTT  TYPE AND SCREEN   ____________________________________________   ED ECG REPORT I, Concha SeMary E Dyann Goodspeed, the attending physician, personally viewed and interpreted this ECG.  EKG is atrial fibrillation rate of 157, no ST elevation, some diffuse T wave inversions, normal intervals ____________________________________________  RADIOLOGY Vela ProseI, Nilesh Stegall E Angelia Hazell, personally viewed and evaluated these images (plain radiographs) as part of my medical decision making, as well as reviewing the written report by the radiologis  ED MD interpretation: No pneumonia  Official radiology report(s): DG Chest Portable 1 View  Result Date: 12/27/2020 CLINICAL DATA:  Shortness of breath EXAM: PORTABLE CHEST 1 VIEW COMPARISON:  None. FINDINGS: Cardiac shadow is within normal limits. Elevation of the right hemidiaphragm is noted. No focal infiltrate or effusion is seen. No acute bony  abnormality is noted. IMPRESSION: No acute abnormality seen. Electronically Signed   By: Alcide CleverMark  Lukens M.D.   On: 12/27/2020 19:08    ____________________________________________   PROCEDURES  Procedure(s) performed (including Critical Care):  .1-3 Lead EKG Interpretation Performed by: Concha SeFunke, Tahnee Cifuentes E, MD Authorized by: Concha SeFunke, Snigdha Howser E, MD     Interpretation: abnormal     ECG rate:  100-140s   ECG rate assessment: tachycardic     Rhythm: atrial fibrillation     Ectopy: none     Conduction: normal   .Critical Care Performed by: Concha SeFunke, Laird Runnion E, MD Authorized by: Concha SeFunke, Breylon Sherrow E, MD   Critical care provider statement:    Critical care time (minutes):  45   Critical care was necessary to treat or prevent imminent or life-threatening deterioration of the following conditions:  Circulatory failure   Critical care was time spent personally by me on the following activities:  Discussions with consultants, evaluation of patient's response to treatment, examination of patient, ordering and performing treatments and interventions, ordering and review of laboratory studies, ordering and review of radiographic studies, pulse oximetry, re-evaluation of patient's condition, obtaining history from patient or surrogate and review of old charts     ____________________________________________   INITIAL IMPRESSION / ASSESSMENT AND PLAN / ED COURSE  Cynthia KeySharon Lucas was evaluated in Emergency Department on 12/27/2020 for the symptoms described in the history of present illness. She was evaluated in the context of the global COVID-19 pandemic, which necessitated consideration that the patient might be at risk for infection with the SARS-CoV-2 virus that causes COVID-19. Institutional protocols and algorithms that pertain to the evaluation of patients at risk for COVID-19 are in a state of rapid change based on information released by regulatory bodies including the CDC and federal and state organizations. These  policies and algorithms were followed during the patient's care in the ED.    Patient is a 70 year old who comes in with A. fib with RVR in the setting of weakness, diarrhea, vomiting.  Patient does have some low-grade blood pressures but I suspect that this is most likely from some dehydration due to the above therefore we will start off with fluid resuscitation and reevaluate after.  Will get labs evaluate for which abnormalities, AKI, anemia.  No shortness of breath to suggest PE.  No chest pain but will get cardiac markers and BNP given patient is new A.  fib.  We will keep patient on cardiac monitor   Patient initially hypotensive but after fluids becomes normotensive heart rates have come down slightly.  Patient given some IV diltiazem and heart rates have come down.  Will start on a dill drip given that with they were not able to stay down with the oral dose.  Her labs are significant for AKI.  She has had multiple episodes of stooling and given that it is because such a bump in her creatinine I will send stool studies to make sure is no evidence of C. difficile or other pathology that would require antibiotics.  Her post void residual was normal so she is not retaining.  On my repeat abdominal exam she continues to have no abdominal pain to suggest intra-abdominal pathology.  However patient did have brown stool that was Hemoccult positive and given the concern for coffee-ground emesis I will start her on a PPI in case there is a GI bleed although her hemoglobin is stable.  Her troponin is elevated by suspect this is demand due to the above.  It was downtrending and I am going to hold on heparin given the concern for possible GI bleed   I will discuss with the hospital team for admission       ____________________________________________   FINAL CLINICAL IMPRESSION(S) / ED DIAGNOSES   Final diagnoses:  Atrial fibrillation with rapid ventricular response (HCC)  AKI (acute kidney injury)  (HCC)  Gastroenteritis      MEDICATIONS GIVEN DURING THIS VISIT:  Medications  pantoprazole (PROTONIX) 80 mg in sodium chloride 0.9 % 100 mL (0.8 mg/mL) infusion (8 mg/hr Intravenous New Bag/Given 12/27/20 1956)  pantoprazole (PROTONIX) injection 40 mg (has no administration in time range)  diltiazem (CARDIZEM) 125 mg in dextrose 5% 125 mL (1 mg/mL) infusion (has no administration in time range)  sodium chloride 0.9 % bolus 500 mL (0 mLs Intravenous Stopped 12/27/20 1955)  pantoprazole (PROTONIX) 80 mg in sodium chloride 0.9 % 100 mL IVPB (0 mg Intravenous Stopped 12/27/20 1954)  sodium chloride 0.9 % bolus 1,000 mL (1,000 mLs Intravenous New Bag/Given 12/27/20 1954)  diltiazem (CARDIZEM) injection 10 mg (10 mg Intravenous Given 12/27/20 2040)  diltiazem (CARDIZEM) tablet 30 mg (30 mg Oral Given 12/27/20 2106)     ED Discharge Orders    None       Note:  This document was prepared using Dragon voice recognition software and may include unintentional dictation errors.   Concha Se, MD 12/27/20 2132

## 2020-12-27 NOTE — ED Notes (Signed)
Pt's son Doreatha Martin had to leave at this time, requesting an update when pt is admitted at [phone]

## 2020-12-27 NOTE — ED Notes (Signed)
Attempted to call report d/t being unable to message floor RN without last name given. Unable to find Sonia Side in [employer] chat function.

## 2020-12-27 NOTE — ED Notes (Signed)
hemocult positive per Dr. Fuller Plan following rectal exam

## 2020-12-27 NOTE — ED Notes (Signed)
Pt's son Sam updated on pt being moved to inpatient room with pt's consent.

## 2020-12-28 ENCOUNTER — Inpatient Hospital Stay: Payer: Medicare Other

## 2020-12-28 ENCOUNTER — Encounter: Payer: Self-pay | Admitting: Family Medicine

## 2020-12-28 ENCOUNTER — Inpatient Hospital Stay: Payer: Medicare Other | Admitting: Certified Registered Nurse Anesthetist

## 2020-12-28 ENCOUNTER — Encounter
Admission: EM | Disposition: A | Discharge status: Home / Self Care | Payer: Self-pay | Source: Home / Self Care | Attending: Hospitalist

## 2020-12-28 DIAGNOSIS — E872 Acidosis, unspecified: Secondary | ICD-10-CM

## 2020-12-28 DIAGNOSIS — N3030 Trigonitis without hematuria: Secondary | ICD-10-CM

## 2020-12-28 DIAGNOSIS — Z01818 Encounter for other preprocedural examination: Secondary | ICD-10-CM

## 2020-12-28 DIAGNOSIS — R778 Other specified abnormalities of plasma proteins: Secondary | ICD-10-CM

## 2020-12-28 DIAGNOSIS — R718 Other abnormality of red blood cells: Secondary | ICD-10-CM | POA: Diagnosis not present

## 2020-12-28 DIAGNOSIS — E1169 Type 2 diabetes mellitus with other specified complication: Secondary | ICD-10-CM

## 2020-12-28 DIAGNOSIS — E876 Hypokalemia: Secondary | ICD-10-CM | POA: Diagnosis not present

## 2020-12-28 DIAGNOSIS — N179 Acute kidney failure, unspecified: Secondary | ICD-10-CM

## 2020-12-28 DIAGNOSIS — I4891 Unspecified atrial fibrillation: Secondary | ICD-10-CM | POA: Diagnosis not present

## 2020-12-28 DIAGNOSIS — N170 Acute kidney failure with tubular necrosis: Secondary | ICD-10-CM | POA: Diagnosis not present

## 2020-12-28 DIAGNOSIS — E1165 Type 2 diabetes mellitus with hyperglycemia: Secondary | ICD-10-CM

## 2020-12-28 DIAGNOSIS — K922 Gastrointestinal hemorrhage, unspecified: Secondary | ICD-10-CM | POA: Diagnosis not present

## 2020-12-28 DIAGNOSIS — I10 Essential (primary) hypertension: Secondary | ICD-10-CM

## 2020-12-28 DIAGNOSIS — N111 Chronic obstructive pyelonephritis: Secondary | ICD-10-CM

## 2020-12-28 DIAGNOSIS — N201 Calculus of ureter: Secondary | ICD-10-CM

## 2020-12-28 DIAGNOSIS — A4151 Sepsis due to Escherichia coli [E. coli]: Secondary | ICD-10-CM

## 2020-12-28 DIAGNOSIS — N189 Chronic kidney disease, unspecified: Secondary | ICD-10-CM

## 2020-12-28 DIAGNOSIS — G4733 Obstructive sleep apnea (adult) (pediatric): Secondary | ICD-10-CM

## 2020-12-28 HISTORY — PX: CYSTOSCOPY W/ URETERAL STENT PLACEMENT: SHX1429

## 2020-12-28 LAB — FOLATE: Folate: 8.6 ng/mL (ref >5.9)

## 2020-12-28 LAB — C DIFFICILE QUICK SCREEN W PCR REFLEX
C Diff antigen: NEGATIVE
C Diff interpretation: NOT DETECTED
C Diff toxin: NEGATIVE

## 2020-12-28 LAB — GASTROINTESTINAL PANEL BY PCR, STOOL (REPLACES STOOL CULTURE)

## 2020-12-28 LAB — GLUCOSE, CAPILLARY
Glucose-Capillary: 320 mg/dL — ABNORMAL HIGH (ref 70–99)
Glucose-Capillary: 291 mg/dL — ABNORMAL HIGH (ref 70–99)
Glucose-Capillary: 282 mg/dL — ABNORMAL HIGH (ref 70–99)
Glucose-Capillary: 339 mg/dL — ABNORMAL HIGH (ref 70–99)

## 2020-12-28 LAB — POTASSIUM: Potassium: 3.1 mmol/L — ABNORMAL LOW (ref 3.5–5.1)

## 2020-12-28 LAB — CBC
HCT: 34.3 % — ABNORMAL LOW (ref 36.0–46.0)
Hemoglobin: 11.5 g/dL — ABNORMAL LOW (ref 12.0–15.0)
MCH: 26.2 pg (ref 26.0–34.0)
MCHC: 33.5 g/dL (ref 30.0–36.0)
MCV: 78.1 fL — ABNORMAL LOW (ref 80.0–100.0)
Platelets: 216 10*3/uL (ref 150–400)
RBC: 4.39 MIL/uL (ref 3.87–5.11)
RDW: 15.4 % (ref 11.5–15.5)
WBC: 11.7 10*3/uL — ABNORMAL HIGH (ref 4.0–10.5)
nRBC: 0 % (ref 0.0–0.2)

## 2020-12-28 LAB — VITAMIN B12: Vitamin B-12: 239 pg/mL (ref 180–914)

## 2020-12-28 LAB — BASIC METABOLIC PANEL
Anion gap: 15 (ref 5–15)
BUN: 80 mg/dL — ABNORMAL HIGH (ref 8–23)
CO2: 16 mmol/L — ABNORMAL LOW (ref 22–32)
Calcium: 7.9 mg/dL — ABNORMAL LOW (ref 8.9–10.3)
Chloride: 105 mmol/L (ref 98–111)
Creatinine, Ser: 6.81 mg/dL — ABNORMAL HIGH (ref 0.44–1.00)
GFR, Estimated: 6 mL/min — ABNORMAL LOW (ref >60)
Glucose, Bld: 295 mg/dL — ABNORMAL HIGH (ref 70–99)
Potassium: 3 mmol/L — ABNORMAL LOW (ref 3.5–5.1)
Sodium: 136 mmol/L (ref 135–145)

## 2020-12-28 LAB — MAGNESIUM: Magnesium: 1.9 mg/dL (ref 1.7–2.4)

## 2020-12-28 LAB — HEPATITIS B SURFACE ANTIBODY,QUALITATIVE: Hep B S Ab: NONREACTIVE

## 2020-12-28 LAB — HEPATITIS C ANTIBODY: HCV Ab: NONREACTIVE

## 2020-12-28 LAB — IRON AND TIBC
Iron: 13 ug/dL — ABNORMAL LOW (ref 28–170)
Saturation Ratios: 7 % — ABNORMAL LOW (ref 10.4–31.8)
TIBC: 192 ug/dL — ABNORMAL LOW (ref 250–450)
UIBC: 179 ug/dL

## 2020-12-28 LAB — HEMOGLOBIN A1C
Hgb A1c MFr Bld: 7.9 % — ABNORMAL HIGH (ref 4.8–5.6)
Mean Plasma Glucose: 180.03 mg/dL

## 2020-12-28 LAB — LACTIC ACID, PLASMA
Lactic Acid, Venous: 1.3 mmol/L (ref 0.5–1.9)
Lactic Acid, Venous: 1.1 mmol/L (ref 0.5–1.9)

## 2020-12-28 LAB — HEPATITIS B SURFACE ANTIGEN: Hepatitis B Surface Ag: NONREACTIVE

## 2020-12-28 LAB — HEPATITIS B CORE ANTIBODY, IGM: Hep B C IgM: NONREACTIVE

## 2020-12-28 LAB — HIV ANTIBODY (ROUTINE TESTING W REFLEX): HIV Screen 4th Generation wRfx: NONREACTIVE

## 2020-12-28 SURGERY — CYSTOSCOPY, WITH RETROGRADE PYELOGRAM AND URETERAL STENT INSERTION
Anesthesia: General | Laterality: Right

## 2020-12-28 MED ORDER — CEFAZOLIN (ANCEF) 1 G IV SOLR
2.0000 g | INTRAVENOUS | Status: DC
  Filled 2020-12-28: qty 2

## 2020-12-28 MED ORDER — LIDOCAINE HCL (CARDIAC) PF 100 MG/5ML IV SOSY
PREFILLED_SYRINGE | INTRAVENOUS | Status: DC | PRN
  Administered 2020-12-28: 100 mg via INTRAVENOUS

## 2020-12-28 MED ORDER — CEFAZOLIN SODIUM-DEXTROSE 2-3 GM-%(50ML) IV SOLR
INTRAVENOUS | Status: DC | PRN
  Administered 2020-12-28: 2 g via INTRAVENOUS

## 2020-12-28 MED ORDER — FENTANYL CITRATE (PF) 100 MCG/2ML IJ SOLN
INTRAMUSCULAR | Status: AC
  Filled 2020-12-28: qty 2

## 2020-12-28 MED ORDER — ONDANSETRON HCL 4 MG/2ML IJ SOLN
INTRAMUSCULAR | Status: DC | PRN
  Administered 2020-12-28: 4 mg via INTRAVENOUS

## 2020-12-28 MED ORDER — OXYCODONE HCL 5 MG PO TABS: 5.0000 mg | ORAL_TABLET | Freq: Once | ORAL | Status: DC | PRN

## 2020-12-28 MED ORDER — SODIUM CHLORIDE 0.9 % IV SOLN
1.0000 g | Freq: Every day | INTRAVENOUS | Status: DC
  Administered 2020-12-28 – 2020-12-30 (×3): 1 g via INTRAVENOUS
  Filled 2020-12-28: qty 0.3
  Filled 2020-12-28 (×3): qty 1

## 2020-12-28 MED ORDER — POTASSIUM CHLORIDE 10 MEQ/100ML IV SOLN
10.0000 meq | INTRAVENOUS | Status: AC
Start: 2020-12-28 — End: 2020-12-28
  Administered 2020-12-28: 10 meq via INTRAVENOUS
  Filled 2020-12-28 (×3): qty 100

## 2020-12-28 MED ORDER — PROPOFOL 10 MG/ML IV BOLUS
INTRAVENOUS | Status: AC
  Filled 2020-12-28: qty 20

## 2020-12-28 MED ORDER — ACETAMINOPHEN 10 MG/ML IV SOLN: 1000.0000 mg | Freq: Once | INTRAVENOUS | Status: DC | PRN

## 2020-12-28 MED ORDER — SUGAMMADEX SODIUM 200 MG/2ML IV SOLN
INTRAVENOUS | Status: DC | PRN
  Administered 2020-12-28: 100 mg via INTRAVENOUS
  Administered 2020-12-28: 200 mg via INTRAVENOUS

## 2020-12-28 MED ORDER — ONDANSETRON HCL 4 MG/2ML IJ SOLN: 4.0000 mg | Freq: Once | INTRAMUSCULAR | Status: DC | PRN

## 2020-12-28 MED ORDER — DEXAMETHASONE SODIUM PHOSPHATE 10 MG/ML IJ SOLN
INTRAMUSCULAR | Status: DC | PRN
  Administered 2020-12-28: 5 mg via INTRAVENOUS

## 2020-12-28 MED ORDER — SUCCINYLCHOLINE CHLORIDE 20 MG/ML IJ SOLN
INTRAMUSCULAR | Status: DC | PRN
  Administered 2020-12-28: 100 mg via INTRAVENOUS

## 2020-12-28 MED ORDER — CYANOCOBALAMIN 1000 MCG/ML IJ SOLN
1000.0000 ug | Freq: Once | INTRAMUSCULAR | Status: AC
  Administered 2020-12-28: 1000 ug via INTRAMUSCULAR
  Filled 2020-12-28 (×2): qty 1

## 2020-12-28 MED ORDER — PHENYLEPHRINE HCL (PRESSORS) 10 MG/ML IV SOLN
INTRAVENOUS | Status: DC | PRN
  Administered 2020-12-28 (×3): 100 ug via INTRAVENOUS
  Administered 2020-12-28: 200 ug via INTRAVENOUS

## 2020-12-28 MED ORDER — INSULIN ASPART 100 UNIT/ML ~~LOC~~ SOLN
0.0000 [IU] | Freq: Three times a day (TID) | SUBCUTANEOUS | Status: DC
  Administered 2020-12-28: 11 [IU] via SUBCUTANEOUS
  Filled 2020-12-28: qty 1

## 2020-12-28 MED ORDER — FENTANYL CITRATE (PF) 100 MCG/2ML IJ SOLN
25.0000 ug | INTRAMUSCULAR | Status: DC | PRN
Start: 2020-12-28 — End: 2020-12-28

## 2020-12-28 MED ORDER — PROPOFOL 10 MG/ML IV BOLUS
INTRAVENOUS | Status: DC | PRN
  Administered 2020-12-28: 80 mg via INTRAVENOUS

## 2020-12-28 MED ORDER — TIMOLOL MALEATE 0.5 % OP SOLN
1.0000 [drp] | Freq: Two times a day (BID) | OPHTHALMIC | Status: DC
  Administered 2020-12-28 – 2021-01-03 (×13): 1 [drp] via OPHTHALMIC
  Filled 2020-12-28: qty 5

## 2020-12-28 MED ORDER — SODIUM CHLORIDE 0.9 % IV SOLN
INTRAVENOUS | Status: DC | PRN
  Administered 2020-12-28: 50 ug/min via INTRAVENOUS

## 2020-12-28 MED ORDER — ROCURONIUM BROMIDE 100 MG/10ML IV SOLN
INTRAVENOUS | Status: DC | PRN
  Administered 2020-12-28: 30 mg via INTRAVENOUS

## 2020-12-28 MED ORDER — FENTANYL CITRATE (PF) 100 MCG/2ML IJ SOLN
INTRAMUSCULAR | Status: DC | PRN
  Administered 2020-12-28 (×2): 50 ug via INTRAVENOUS

## 2020-12-28 MED ORDER — BRIMONIDINE TARTRATE 0.2 % OP SOLN
1.0000 [drp] | Freq: Two times a day (BID) | OPHTHALMIC | Status: DC
  Administered 2020-12-28 – 2021-01-03 (×13): 1 [drp] via OPHTHALMIC
  Filled 2020-12-28: qty 5

## 2020-12-28 MED ORDER — BOOST / RESOURCE BREEZE PO LIQD CUSTOM
1.0000 | Freq: Three times a day (TID) | ORAL | Status: DC
  Administered 2020-12-28 – 2021-01-03 (×9): 1 via ORAL

## 2020-12-28 MED ORDER — POLYSACCHARIDE IRON COMPLEX 150 MG PO CAPS
150.0000 mg | ORAL_CAPSULE | Freq: Every day | ORAL | Status: DC
  Administered 2020-12-28 – 2021-01-03 (×6): 150 mg via ORAL
  Filled 2020-12-28 (×7): qty 1

## 2020-12-28 MED ORDER — SUGAMMADEX SODIUM 500 MG/5ML IV SOLN
INTRAVENOUS | Status: AC
  Filled 2020-12-28: qty 5

## 2020-12-28 MED ORDER — SODIUM BICARBONATE 8.4 % IV SOLN
INTRAVENOUS | Status: DC
  Filled 2020-12-28 (×3): qty 850
  Filled 2020-12-28: qty 150
  Filled 2020-12-28: qty 850

## 2020-12-28 MED ORDER — LACTATED RINGERS IV SOLN: INTRAVENOUS | Status: DC | PRN

## 2020-12-28 MED ORDER — INSULIN GLARGINE 100 UNIT/ML ~~LOC~~ SOLN
8.0000 [IU] | Freq: Every day | SUBCUTANEOUS | Status: DC
  Administered 2020-12-28: 8 [IU] via SUBCUTANEOUS
  Filled 2020-12-28 (×2): qty 0.08

## 2020-12-28 MED ORDER — POTASSIUM CHLORIDE 10 MEQ/100ML IV SOLN
10.0000 meq | INTRAVENOUS | Status: AC
Start: 2020-12-28 — End: 2020-12-28
  Administered 2020-12-28 (×2): 10 meq via INTRAVENOUS
  Filled 2020-12-28 (×2): qty 100

## 2020-12-28 MED ORDER — OXYCODONE HCL 5 MG/5ML PO SOLN: 5.0000 mg | Freq: Once | ORAL | Status: DC | PRN

## 2020-12-28 MED ORDER — IOHEXOL 180 MG/ML  SOLN
INTRAMUSCULAR | Status: DC | PRN
  Administered 2020-12-28: 10 mL

## 2020-12-28 MED ORDER — INSULIN ASPART 100 UNIT/ML ~~LOC~~ SOLN
0.0000 [IU] | Freq: Every day | SUBCUTANEOUS | Status: DC
  Administered 2020-12-28: 4 [IU] via SUBCUTANEOUS
  Filled 2020-12-28: qty 1

## 2020-12-28 SURGICAL SUPPLY — 20 items
BAG DRAIN CYSTO-URO LG1000N (MISCELLANEOUS) ×2 IMPLANT
BAG URINE DRAIN 2000ML AR STRL (UROLOGICAL SUPPLIES) ×1 IMPLANT
CATH FOL 2WAY LX 16X5 (CATHETERS) ×1 IMPLANT
CATH URETL 5X70 OPEN END (CATHETERS) ×2 IMPLANT
GLOVE SURG ENC MOIS LTX SZ8 (GLOVE) ×2 IMPLANT
GOWN STRL REUS W/ TWL LRG LVL4 (GOWN DISPOSABLE) ×1 IMPLANT
GOWN STRL REUS W/ TWL XL LVL3 (GOWN DISPOSABLE) ×1 IMPLANT
GOWN STRL REUS W/TWL LRG LVL4 (GOWN DISPOSABLE) ×1
GOWN STRL REUS W/TWL XL LVL3 (GOWN DISPOSABLE) ×1
GUIDEWIRE SENSOR ANG DUAL FLEX (WIRE) ×1 IMPLANT
GUIDEWIRE STR DUAL SENSOR (WIRE) ×4 IMPLANT
IV NS IRRIG 3000ML ARTHROMATIC (IV SOLUTION) ×2 IMPLANT
KIT TURNOVER CYSTO (KITS) ×2 IMPLANT
MANIFOLD NEPTUNE II (INSTRUMENTS) ×2 IMPLANT
PACK CYSTO AR (MISCELLANEOUS) ×2 IMPLANT
SET CYSTO W/LG BORE CLAMP LF (SET/KITS/TRAYS/PACK) ×2 IMPLANT
STENT URET 6FRX24 CONTOUR (STENTS) ×1 IMPLANT
STENT URET 6FRX26 CONTOUR (STENTS) IMPLANT
SYR 10ML LL (SYRINGE) ×1 IMPLANT
WATER STERILE IRR 1000ML POUR (IV SOLUTION) ×2 IMPLANT

## 2020-12-28 NOTE — Anesthesia Postprocedure Evaluation (Signed)
Anesthesia Post Note  [name]  Procedure(s) Performed: CYSTOSCOPY WITH RETROGRADE PYELOGRAM/URETERAL STENT PLACEMENT (Right )  Patient location during evaluation: PACU Anesthesia Type: General Level of consciousness: lethargic Pain management: pain level controlled Vital Signs Assessment: post-procedure vital signs reviewed and stable Respiratory status: spontaneous breathing, nonlabored ventilation, respiratory function stable and patient connected to nasal cannula oxygen Cardiovascular status: blood pressure returned to baseline and stable Postop Assessment: no apparent nausea or vomiting Anesthetic complications: no   No complications documented.   Last Vitals:  Vitals:   12/28/20 1850 12/28/20 1900  BP: (!) 99/59   Pulse: 100 97  Resp: 15 17  Temp:    SpO2: 95% 97%    Last Pain:  Vitals:   12/28/20 1900  TempSrc:   PainSc: 0-No pain                 Corinda Gubler

## 2020-12-28 NOTE — Op Note (Signed)
Procedure: Cystoscopy with right retrograde pyelogram and stent.  Preop diagnosis: 2 cm right UPJ stone with AKI.  Postop diagnosis: Same with pyelonephrosis and sepsis and findings of chronic follicular cystitis.  Surgeon: Dr. Bjorn Pippin.  Anesthesia: General.  Specimen: Urine culture from the bladder urine and right renal pelvis.  Drains: 6 French by 24 cm right contour double-J stent and 16 French Foley catheter.  EBL: None.  Complications: None.  Right retrograde pyelogram findings: Hydronephrosis in the right kidney on a limited retrograde.   Indications: The patient is a 70 year old female who presented to the emergency room with A. fib and weakness.  She was found to have AKI with a creatinine above 6.  A renal ultrasound demonstrated moderate right hydronephrosis.  She has a prior history of stones with ureteroscopy in 2020.  A CT scan was ordered which showed a 2 x 1.5 cm stone with obstruction at the right UPJ.  It was felt that emergent stenting was indicated.  The urine had been requested but not obtained prior to the procedure.  She was given 2 g of Ancef.  General anesthetic was induced.  She was placed in lithotomy position.  Her genitalia was prepped with Betadine scrub.  And she was draped in usual sterile fashion.  The 21 French cystoscope with a 30 degree lens was passed.  Examination revealed a normal urethra.  Initial urine was turbid and the specimen was obtained before irrigation was initiated.  This will be sent for culture.  Inspection of the bladder once the turbid urine have been irrigated out demonstrated diffuse changes consistent with follicular cystitis.  Ureteral orifices were unremarkable.  No tumors or stones were noted.  A 5 [place] open-ended catheter was then passed an attempt was made to perform a retrograde pyelogram but I was unable to get the ankle correct on the open-ended catheter to inject contrast.  A sensor wire with an angled tip was then  passed through the open-ended catheter and I was able to negotiate that up the ureter.  Under fluoroscopy the stone was visualized and the wire push the stone back into the kidney and coiled in the kidney.  The open-ended catheter was then reinserted over the wire into the kidney and the wire was removed.  Initially there was no drainage.  The catheter was backed out slightly and a small amount of contrast was instilled.  This confirmed placement within the collecting system with dilation.  Because of my concern about infection I did not fully fill the collecting system and ureter with contrast.  After injection of contrast and remove the syringe, there was a brisk hydronephrotic drip which eventually became quite purulent.  A portion of this purulent urine was collected and sent for culture as well.  The sensor wire was then reinserted to the kidney and the open-ended catheter was removed.  A 6 French by 24 cm contour double-J stent was passed without difficulty under fluoroscopic guidance to the kidney.  The wire was removed and a good coil formed in the kidney and in the bladder.  There was brisk reflux of purulent urine alongside the wire before the stent was placed.  The bladder was then drained and the cystoscope was removed.  A 16 French Foley catheter was inserted.  The balloon was filled with 10 mL of sterile fluid and the catheter was placed to straight drainage.  She was taken down from lithotomy position, her anesthetic was reversed and she was moved recovery in  stable condition.  There were no complications.  I will notify the hospitalist service to broaden her antibiotic coverage in the face of what appears to be sepsis.

## 2020-12-28 NOTE — H&P (Signed)
Subjective:    Consult requested by Dr. Marrion Coy.   Cynthia Lucas is a 70 yo AAF who presented to the ER with weakness and tachycardia.  She had an elevated Cr on labs to 6.56 and a renal US showed right hydro.  A CT today shows a 2cm RUPJ stone with obstruction.  The left kidney is mild atrophied.  She has had some right flank pain but no nausea, fever or voiding complaints.  She denies a history of UTI's but did have ureteroscopy for a stone in 4/20.   She has Afib with RVR and is being evaluated for that but the renal obstruction with AKI require emergent stent that won't wait for echocardiogram.  ROS:  Review of Systems  Constitutional: Positive for malaise/fatigue.  Genitourinary: Positive for flank pain.  Neurological: Positive for tremors.  All other systems reviewed and are negative.   Allergies  Allergen Reactions  . Other     Contact lense solution causes eye irritation    Past Medical History:  Diagnosis Date  . Cough    CHRONIC  . Diabetes mellitus without complication (HCC)   . GERD (gastroesophageal reflux disease)   . History of kidney stones   . Hypertension   . Sleep apnea    no cpap  . Tremor    HEAD    Past Surgical History:  Procedure Laterality Date  . ABDOMINAL HYSTERECTOMY    . CATARACT EXTRACTION W/PHACO Left 03/07/2018   Procedure: CATARACT EXTRACTION PHACO AND INTRAOCULAR LENS PLACEMENT (IOC);  Surgeon: Galen Manila, MD;  Location: [facility];  Service: Ophthalmology;  Laterality: Left;  Lot # [id number] H [place]: 04:19.8 AP%: 19.9 CDE: 51.70  . CATARACT EXTRACTION W/PHACO Right 07/07/2020   Procedure: CATARACT EXTRACTION PHACO AND INTRAOCULAR LENS PLACEMENT (IOC) RIGHT DIABETIC 7.75 00:42.8;  Surgeon: Galen Manila, MD;  Location: [facility];  Service: Ophthalmology;  Laterality: Right;  diabetic - oral meds  . CESAREAN SECTION    . COLONOSCOPY    . CYSTOSCOPY W/ RETROGRADES Right 01/07/2019   Procedure: CYSTOSCOPY WITH RETROGRADE PYELOGRAM;   Surgeon: Riki Altes, MD;  Location: [facility];  Service: Urology;  Laterality: Right;  . CYSTOSCOPY WITH STENT PLACEMENT Right 01/07/2019   Procedure: CYSTOSCOPY WITH STENT PLACEMENT;  Surgeon: Riki Altes, MD;  Location: [facility];  Service: Urology;  Laterality: Right;  . CYSTOSCOPY WITH URETEROSCOPY, STONE BASKETRY AND STENT PLACEMENT Right 01/29/2019   Procedure: CYSTOSCOPY/URETEROSCOPY/HOLMIUM LASER/STENT EXCHANGE;  Surgeon: Riki Altes, MD;  Location: [facility];  Service: Urology;  Laterality: Right;  . JOINT REPLACEMENT      Social History   Socioeconomic History  . Marital status: Divorced    Spouse name: Not on file  . Number of children: Not on file  . Years of education: Not on file  . Highest education level: Not on file  Occupational History  . Not on file  Tobacco Use  . Smoking status: Never Smoker  . Smokeless tobacco: Never Used  Vaping Use  . Vaping Use: Never used  Substance and Sexual Activity  . Alcohol use: Yes    Comment: OCCAS  . Drug use: Never  . Sexual activity: Yes    Birth control/protection: None  Other Topics Concern  . Not on file  Social History Narrative  . Not on file   Social Determinants of Health   Financial Resource Strain: Not on file  Food Insecurity: Not on file  Transportation Needs: Not on file  Physical Activity: Not on  file  Stress: Not on file  Social Connections: Not on file  Intimate Partner Violence: Not on file    Family History  Problem Relation Age of Onset  . Heart disease Mother     Anti-infectives: Anti-infectives (From admission, onward)   Start     Dose/Rate Route Frequency Ordered Stop   12/28/20 1545  ceFAZolin (ANCEF) powder 2 g        2 g Other To Surgery 12/28/20 1457 12/29/20 1545      Current Facility-Administered Medications  Medication Dose Route Frequency Provider Last Rate Last Admin  . acetaminophen (TYLENOL) tablet 650 mg  650 mg Oral Q6H PRN Mansy, Jan A, MD       Or  .  acetaminophen (TYLENOL) suppository 650 mg  650 mg Rectal Q6H PRN Mansy, Jan A, MD      . atorvastatin (LIPITOR) tablet 40 mg  40 mg Oral QHS Mansy, Jan A, MD   40 mg at 12/28/20 0136  . brimonidine (ALPHAGAN) 0.2 % ophthalmic solution 1 drop  1 drop Both Eyes BID Otelia Sergeant, RPH   1 drop at 12/28/20 0102   And  . timolol (TIMOPTIC) 0.5 % ophthalmic solution 1 drop  1 drop Both Eyes BID Otelia Sergeant, RPH   1 drop at 12/28/20 0935  . ceFAZolin (ANCEF) powder 2 g  2 g Other To OR Bjorn Pippin, MD      . diltiazem (CARDIZEM) 125 mg in dextrose 5% 125 mL (1 mg/mL) infusion  5-15 mg/hr Intravenous Continuous Concha Se, MD 5 mL/hr at 12/27/20 2153 5 mg/hr at 12/27/20 2153  . feeding supplement (BOOST / RESOURCE BREEZE) liquid 1 Container  1 Container Oral TID BM Marrion Coy, MD      . insulin aspart (novoLOG) injection 0-15 Units  0-15 Units Subcutaneous TID WC Marrion Coy, MD   11 Units at 12/28/20 1154  . insulin aspart (novoLOG) injection 0-5 Units  0-5 Units Subcutaneous QHS Marrion Coy, MD      . insulin glargine (LANTUS) injection 8 Units  8 Units Subcutaneous Daily Marrion Coy, MD   8 Units at 12/28/20 1155  . latanoprost (XALATAN) 0.005 % ophthalmic solution 1 drop  1 drop Left Eye QHS Mansy, Jan A, MD   1 drop at 12/28/20 0144  . ondansetron (ZOFRAN) tablet 4 mg  4 mg Oral Q6H PRN Mansy, Jan A, MD       Or  . ondansetron [facility]) injection 4 mg  4 mg Intravenous Q6H PRN Mansy, Jan A, MD      . pantoprazole (PROTONIX) 80 mg in sodium chloride 0.9 % 100 mL (0.8 mg/mL) infusion  8 mg/hr Intravenous Continuous Concha Se, MD 10 mL/hr at 12/28/20 0748 8 mg/hr at 12/28/20 0748  . [START ON 12/31/2020] pantoprazole (PROTONIX) injection 40 mg  40 mg Intravenous Q12H Concha Se, MD      . potassium chloride (KLOR-CON) packet 40 mEq  40 mEq Oral Once Mansy, Jan A, MD      . sodium bicarbonate 150 mEq in dextrose 5 % 1,000 mL infusion   Intravenous Continuous Marrion Coy, MD 125 mL/hr  at 12/28/20 1423 New Bag at 12/28/20 1423  . traZODone (DESYREL) tablet 25 mg  25 mg Oral QHS PRN Mansy, Vernetta Honey, MD         Objective: Vital signs in last 24 hours: BP 106/86 (BP Location: Right Arm)   Pulse 89   Temp 98.6 F (37  C) (Oral)   Resp 20   Ht  (1.702 m)   Wt 91.6 kg   SpO2 97%   BMI 31.63 kg/m   Intake/Output from previous day: 03/27 0701 - 03/28 0700 In: 2180.7 [I.V.:80.6; IV Piggyback:2100.1] Out: -  Intake/Output this shift: Total I/O In: 190.7 [P.O.:170; I.V.:20.7] Out: -    Physical Exam Vitals reviewed.  Constitutional:      Appearance: Normal appearance.     Comments: tremulous  Cardiovascular:     Rate and Rhythm: Tachycardia present. Rhythm irregular.     Heart sounds: Normal heart sounds.  Pulmonary:     Effort: Pulmonary effort is normal. No respiratory distress.     Breath sounds: Normal breath sounds.  Abdominal:     Palpations: Abdomen is soft.     Tenderness: There is abdominal tenderness (right upper quadrant and flank).  Musculoskeletal:        General: No swelling or tenderness. Normal range of motion.  Skin:    General: Skin is warm and dry.  Neurological:     General: No focal deficit present.     Mental Status: She is alert and oriented to person, place, and time.  Psychiatric:        Mood and Affect: Mood normal.        Behavior: Behavior normal.     Lab Results:  Results for orders placed or performed during the hospital encounter of 12/27/20 (from the past 24 hour(s))  CBC with Differential     Status: Abnormal   Collection Time: 12/27/20  6:41 PM  Result Value Ref Range   WBC 12.3 (H) 4.0 - 10.5 K/uL   RBC 5.24 (H) 3.87 - 5.11 MIL/uL   Hemoglobin 13.7 12.0 - 15.0 g/dL   HCT 16.1 09.6 - 04.5 %   MCV 79.2 (L) 80.0 - 100.0 fL   MCH 26.1 26.0 - 34.0 pg   MCHC 33.0 30.0 - 36.0 g/dL   RDW 40.9 (H) 81.1 - 91.4 %   Platelets 237 150 - 400 K/uL   nRBC 0.0 0.0 - 0.2 %   Neutrophils Relative % 89 %   Neutro Abs 10.9  (H) 1.7 - 7.7 K/uL   Lymphocytes Relative 6 %   Lymphs Abs 0.7 0.7 - 4.0 K/uL   Monocytes Relative 5 %   Monocytes Absolute 0.6 0.1 - 1.0 K/uL   Eosinophils Relative 0 %   Eosinophils Absolute 0.0 0.0 - 0.5 K/uL   Basophils Relative 0 %   Basophils Absolute 0.0 0.0 - 0.1 K/uL   Immature Granulocytes 0 %   Abs Immature Granulocytes 0.05 0.00 - 0.07 K/uL  Comprehensive metabolic panel     Status: Abnormal   Collection Time: 12/27/20  6:41 PM  Result Value Ref Range   Sodium 137 135 - 145 mmol/L   Potassium 2.9 (L) 3.5 - 5.1 mmol/L   Chloride 100 98 - 111 mmol/L   CO2 21 (L) 22 - 32 mmol/L   Glucose, Bld 441 (H) 70 - 99 mg/dL   BUN 73 (H) 8 - 23 mg/dL   Creatinine, Ser 7.82 (H) 0.44 - 1.00 mg/dL   Calcium 8.3 (L) 8.9 - 10.3 mg/dL   Total Protein 7.9 6.5 - 8.1 g/dL   Albumin 3.3 (L) 3.5 - 5.0 g/dL   AST 38 15 - 41 U/L   ALT 21 0 - 44 U/L   Alkaline Phosphatase 58 38 - 126 U/L   Total Bilirubin 0.7 0.3 - 1.2  mg/dL   GFR, Estimated 6 (L) >60 mL/min   Anion gap 16 (H) 5 - 15  TSH     Status: None   Collection Time: 12/27/20  6:41 PM  Result Value Ref Range   TSH 1.858 0.350 - 4.500 uIU/mL  T4, free     Status: None   Collection Time: 12/27/20  6:41 PM  Result Value Ref Range   Free T4 0.92 0.61 - 1.12 ng/dL  Magnesium     Status: None   Collection Time: 12/27/20  6:41 PM  Result Value Ref Range   Magnesium 2.2 1.7 - 2.4 mg/dL  Brain natriuretic peptide     Status: Abnormal   Collection Time: 12/27/20  6:41 PM  Result Value Ref Range   B Natriuretic Peptide 530.7 (H) 0.0 - 100.0 pg/mL  Troponin I (High Sensitivity)     Status: Abnormal   Collection Time: 12/27/20  6:41 PM  Result Value Ref Range   Troponin I (High Sensitivity) 241 (HH) <18 ng/L  Protime-INR     Status: None   Collection Time: 12/27/20  6:41 PM  Result Value Ref Range   Prothrombin Time 14.3 11.4 - 15.2 seconds   INR 1.2 0.8 - 1.2  APTT     Status: None   Collection Time: 12/27/20  6:41 PM  Result  Value Ref Range   aPTT 35 24 - 36 seconds  Resp Panel by RT-PCR (Flu A&B, Covid) Nasopharyngeal Swab     Status: None   Collection Time: 12/27/20  6:48 PM   Specimen: Nasopharyngeal Swab; Nasopharyngeal(NP) swabs in vial transport medium  Result Value Ref Range   SARS Coronavirus 2 by RT PCR NEGATIVE NEGATIVE   Influenza A by PCR NEGATIVE NEGATIVE   Influenza B by PCR NEGATIVE NEGATIVE  Type and screen [facility]     Status: None   Collection Time: 12/27/20  6:48 PM  Result Value Ref Range   ABO/RH(D) B NEG    Antibody Screen NEG    Sample Expiration      12/30/2020,2359 Performed at [facility], [address] [place], [place] [id number]   Troponin I (High Sensitivity)     Status: Abnormal   Collection Time: 12/27/20  8:39 PM  Result Value Ref Range   Troponin I (High Sensitivity) 224 (HH) <18 ng/L  Gastrointestinal Panel by PCR , Stool     Status: None   Collection Time: 12/27/20  8:39 PM   Specimen: Stool  Result Value Ref Range   Campylobacter species NOT DETECTED NOT DETECTED   Plesimonas shigelloides NOT DETECTED NOT DETECTED   Salmonella species NOT DETECTED NOT DETECTED   Yersinia enterocolitica NOT DETECTED NOT DETECTED   Vibrio species NOT DETECTED NOT DETECTED   Vibrio cholerae NOT DETECTED NOT DETECTED   Enteroaggregative E coli (EAEC) NOT DETECTED NOT DETECTED   Enteropathogenic E coli (EPEC) NOT DETECTED NOT DETECTED   Enterotoxigenic E coli (ETEC) NOT DETECTED NOT DETECTED   Shiga like toxin producing E coli (STEC) NOT DETECTED NOT DETECTED   Shigella/Enteroinvasive E coli (EIEC) NOT DETECTED NOT DETECTED   Cryptosporidium NOT DETECTED NOT DETECTED   Cyclospora cayetanensis NOT DETECTED NOT DETECTED   Entamoeba histolytica NOT DETECTED NOT DETECTED   Giardia lamblia NOT DETECTED NOT DETECTED   Adenovirus F40/41 NOT DETECTED NOT DETECTED   Astrovirus NOT DETECTED NOT DETECTED   Norovirus GI/GII NOT DETECTED NOT DETECTED    Rotavirus A NOT DETECTED NOT DETECTED  Sapovirus (I, II, IV, and V) NOT DETECTED NOT DETECTED  C Difficile Quick Screen w PCR reflex     Status: None   Collection Time: 12/27/20  8:39 PM   Specimen: Stool  Result Value Ref Range   C Diff antigen NEGATIVE NEGATIVE   C Diff toxin NEGATIVE NEGATIVE   C Diff interpretation No C. difficile detected.   Magnesium     Status: None   Collection Time: 12/27/20  8:39 PM  Result Value Ref Range   Magnesium 2.1 1.7 - 2.4 mg/dL  Basic metabolic panel     Status: Abnormal   Collection Time: 12/28/20  5:02 AM  Result Value Ref Range   Sodium 136 135 - 145 mmol/L   Potassium 3.0 (L) 3.5 - 5.1 mmol/L   Chloride 105 98 - 111 mmol/L   CO2 16 (L) 22 - 32 mmol/L   Glucose, Bld 295 (H) 70 - 99 mg/dL   BUN 80 (H) 8 - 23 mg/dL   Creatinine, Ser 5.36 (H) 0.44 - 1.00 mg/dL   Calcium 7.9 (L) 8.9 - 10.3 mg/dL   GFR, Estimated 6 (L) >60 mL/min   Anion gap 15 5 - 15  CBC     Status: Abnormal   Collection Time: 12/28/20  5:02 AM  Result Value Ref Range   WBC 11.7 (H) 4.0 - 10.5 K/uL   RBC 4.39 3.87 - 5.11 MIL/uL   Hemoglobin 11.5 (L) 12.0 - 15.0 g/dL   HCT 14.4 (L) 31.5 - 40.0 %   MCV 78.1 (L) 80.0 - 100.0 fL   MCH 26.2 26.0 - 34.0 pg   MCHC 33.5 30.0 - 36.0 g/dL   RDW 86.7 61.9 - 50.9 %   Platelets 216 150 - 400 K/uL   nRBC 0.0 0.0 - 0.2 %  HIV Antibody (routine testing w rflx)     Status: None   Collection Time: 12/28/20  5:02 AM  Result Value Ref Range   HIV Screen 4th Generation wRfx Non Reactive Non Reactive  Hemoglobin A1c     Status: Abnormal   Collection Time: 12/28/20 10:39 AM  Result Value Ref Range   Hgb A1c MFr Bld 7.9 (H) 4.8 - 5.6 %   Mean Plasma Glucose 180.03 mg/dL  Hepatitis B surface antibody,qualitative     Status: None   Collection Time: 12/28/20 10:45 AM  Result Value Ref Range   Hep B S Ab NON REACTIVE NON REACTIVE  Hepatitis B surface antigen     Status: None   Collection Time: 12/28/20 10:45 AM  Result Value Ref Range    Hepatitis B Surface Ag NON REACTIVE NON REACTIVE  Vitamin B12     Status: None   Collection Time: 12/28/20 10:45 AM  Result Value Ref Range   Vitamin B-12 239 180 - 914 pg/mL  Folate     Status: None   Collection Time: 12/28/20 10:45 AM  Result Value Ref Range   Folate 8.6 >5.9 ng/mL  Iron and TIBC     Status: Abnormal   Collection Time: 12/28/20 10:45 AM  Result Value Ref Range   Iron 13 (L) 28 - 170 ug/dL   TIBC 326 (L) 712 - 458 ug/dL   Saturation Ratios 7 (L) 10.4 - 31.8 %   UIBC 179 ug/dL  Glucose, capillary     Status: Abnormal   Collection Time: 12/28/20 11:30 AM  Result Value Ref Range   Glucose-Capillary 320 (H) 70 - 99 mg/dL   Comment 1 Notify  RN    Comment 2 Document in Chart   Potassium     Status: Abnormal   Collection Time: 12/28/20  3:04 PM  Result Value Ref Range   Potassium 3.1 (L) 3.5 - 5.1 mmol/L  Magnesium     Status: None   Collection Time: 12/28/20  3:04 PM  Result Value Ref Range   Magnesium 1.9 1.7 - 2.4 mg/dL  Glucose, capillary     Status: Abnormal   Collection Time: 12/28/20  4:57 PM  Result Value Ref Range   Glucose-Capillary 291 (H) 70 - 99 mg/dL    BMET Recent Labs    12/27/20 1841 12/28/20 0502 12/28/20 1504  NA 137 136  --   K 2.9* 3.0* 3.1*  CL 100 105  --   CO2 21* 16*  --   GLUCOSE 441* 295*  --   BUN 73* 80*  --   CREATININE 6.56* 6.81*  --   CALCIUM 8.3* 7.9*  --    PT/INR Recent Labs    12/27/20 1841  LABPROT 14.3  INR 1.2   ABG No results for input(s): PHART, HCO3 in the last 72 hours.  Invalid input(s): PCO2, PO2  Studies/Results: CT ABDOMEN PELVIS WO CONTRAST  Result Date: 12/28/2020 CLINICAL DATA:  Flank pain, kidney stones suspected, right hydronephrosis by ultrasound EXAM: CT ABDOMEN AND PELVIS WITHOUT CONTRAST TECHNIQUE: Multidetector CT imaging of the abdomen and pelvis was performed following the standard protocol without IV contrast. COMPARISON:  Same day renal ultrasound, 12/28/2020 FINDINGS: Lower  chest: Atelectasis and/or consolidation of the dependent right lung base. Hepatobiliary: No solid liver abnormality is seen. Hepatic steatosis. No gallstones, gallbladder wall thickening, or biliary dilatation. Pancreas: Unremarkable. No pancreatic ductal dilatation or surrounding inflammatory changes. Spleen: Normal in size without significant abnormality. Adrenals/Urinary Tract: Adrenal glands are unremarkable. Moderate right hydronephrosis with an oblong calculus at the right ureteropelvic junction measuring 2.0 cm in length. Right-sided perinephric fat stranding. No additional calculi identified. Stomach/Bowel: Stomach is within normal limits. Appendix appears normal. No evidence of bowel wall thickening, distention, or inflammatory changes. Vascular/Lymphatic: Aortic atherosclerosis. No enlarged abdominal or pelvic lymph nodes. Reproductive: Status post hysterectomy. Other: No abdominal wall hernia or abnormality. No abdominopelvic ascites. Musculoskeletal: No acute or significant osseous findings. IMPRESSION: 1. Moderate right hydronephrosis with an oblong calculus at the right ureteropelvic junction measuring 2.0 cm in length. Right-sided perinephric fat stranding. No additional calculi identified. 2. Hepatic steatosis. Aortic Atherosclerosis (ICD10-I70.0). Electronically Signed   By: Lauralyn Primes M.D.   On: 12/28/2020 12:49   US RENAL  Result Date: 12/28/2020 CLINICAL DATA:  Acute kidney injury EXAM: RENAL / URINARY TRACT ULTRASOUND COMPLETE COMPARISON:  None. FINDINGS: Right Kidney: Renal measurements: 12.5 x 6.7 x 7.5 cm = volume: 328 mL. Moderate hydronephrosis. No renal mass. No visualized shadowing stone. Left Kidney: Renal measurements: 11.1 x 4.6 x 5.1 cm = volume: Is 138 mL. Echogenicity within normal limits. No mass or hydronephrosis visualized. Bladder: Appears normal for degree of bladder distention. Patient unable to void at time of examination. Other: None. IMPRESSION: Moderate right  hydronephrosis. Electronically Signed   By: Deatra Robinson M.D.   On: 12/28/2020 00:56   DG Chest Portable 1 View  Result Date: 12/27/2020 CLINICAL DATA:  Shortness of breath EXAM: PORTABLE CHEST 1 VIEW COMPARISON:  None. FINDINGS: Cardiac shadow is within normal limits. Elevation of the right hemidiaphragm is noted. No focal infiltrate or effusion is seen. No acute bony abnormality is noted. IMPRESSION: No acute abnormality seen.  Electronically Signed   By: Alcide Clever M.D.   On: 12/27/2020 19:08   DG OR UROLOGY CYSTO IMAGE ([facility] ONLY)  Result Date: 12/28/2020 There is no interpretation for this exam.  This order is for images obtained during a surgical procedure.  Please See "Surgeries" Tab for more information regarding the procedure.   I have reviewed her imaging, labs and prior and current clinical records and discussed the case with the admitting physician.   Assessment/Plan: AKI with 2cm RUP  Stone and mild left renal atrophy.    She needs emergent cystoscopy with right retrograde pyelography with stenting and will need definitive stone management at a later date.   I reviewed the risks of bleeding, infection, ureteral injury, need for secondary procedures such as a nephrostomy tube or ureteroscopy, thrombotic events and anesthetic complications.     Afib with RVR.   Cardiology is evaluating this.         CC: Dr. Marrion Coy and [facility].   Bjorn Pippin 12/28/2020

## 2020-12-28 NOTE — Consult Note (Signed)
Wyline Mood , MD [address], [address], [place], [place], [address] [address], [place], [place], [address] Phone: [phone]  Fax: [phone]  Consultation  Referring Provider:    Dr Arville Care Primary Care Physician:  Toy Cookey, FNP Primary Gastroenterologist:  None         Reason for Consultation:     GI bleed  Date of Admission:  12/27/2020 Date of Consultation:  12/28/2020         HPI:   Cynthia Lucas is a 70 y.o. female presented to the hospital on 12/27/2020 with acute onset of nausea vomiting and diarrhea.  Had some coffee-ground emesis.  In the emergency room she was noted to be in atrial fibrillation with RVR, elevated blood glucose and AKI.  Elevated troponin level.  Baseline hemoglobin is around 12.1 g 1 year back , this morning is 11.5 g with an MCV of 78.1.  Baseline creatinine 1.26 but presently 6.81 elevated blood glucose, low potassium.  C. difficile testing was negative and GI PCR testing was also negative.   She states that she developed some nausea vomiting 2 days back and subsequently had a few episodes of vomiting which was initially nonbloody then became bloody.  Had 4 episodes of bloody emesis.  Denies any change in the color of her stools.  She has had loose stools for a few days.  Presently her stools are loose but brown in color.  Has had no further episodes of hematemesis.  Denies any use of NSAIDs or blood thinners.  Denies any abdominal pain.  Denies any excess alcohol consumption. Past Medical History:  Diagnosis Date  . Cough    CHRONIC  . Diabetes mellitus without complication (HCC)   . GERD (gastroesophageal reflux disease)   . History of kidney stones   . Hypertension   . Sleep apnea    no cpap  . Tremor    HEAD    Past Surgical History:  Procedure Laterality Date  . ABDOMINAL HYSTERECTOMY    . CATARACT EXTRACTION W/PHACO Left 03/07/2018   Procedure: CATARACT EXTRACTION PHACO AND INTRAOCULAR LENS PLACEMENT (IOC);  Surgeon:  Galen Manila, MD;  Location: [facility];  Service: Ophthalmology;  Laterality: Left;  Lot # [id number] H [place]: 04:19.8 AP%: 19.9 CDE: 51.70  . CATARACT EXTRACTION W/PHACO Right 07/07/2020   Procedure: CATARACT EXTRACTION PHACO AND INTRAOCULAR LENS PLACEMENT (IOC) RIGHT DIABETIC 7.75 00:42.8;  Surgeon: Galen Manila, MD;  Location: [facility];  Service: Ophthalmology;  Laterality: Right;  diabetic - oral meds  . CESAREAN SECTION    . COLONOSCOPY    . CYSTOSCOPY W/ RETROGRADES Right 01/07/2019   Procedure: CYSTOSCOPY WITH RETROGRADE PYELOGRAM;  Surgeon: Riki Altes, MD;  Location: [facility];  Service: Urology;  Laterality: Right;  . CYSTOSCOPY WITH STENT PLACEMENT Right 01/07/2019   Procedure: CYSTOSCOPY WITH STENT PLACEMENT;  Surgeon: Riki Altes, MD;  Location: [facility];  Service: Urology;  Laterality: Right;  . CYSTOSCOPY WITH URETEROSCOPY, STONE BASKETRY AND STENT PLACEMENT Right 01/29/2019   Procedure: CYSTOSCOPY/URETEROSCOPY/HOLMIUM LASER/STENT EXCHANGE;  Surgeon: Riki Altes, MD;  Location: [facility];  Service: Urology;  Laterality: Right;  . JOINT REPLACEMENT      Prior to Admission medications   Medication Sig Start Date End Date Taking? Authorizing Provider  atorvastatin (LIPITOR) 40 MG tablet Take 40 mg by mouth at bedtime.    Yes [provider]  brimonidine-timolol (COMBIGAN) 0.2-0.5 % ophthalmic solution Place 1 drop into both eyes every 12 (twelve)  hours.   Yes [provider]  hydrochlorothiazide (HYDRODIURIL) 25 MG tablet Take 25 mg by mouth daily.  01/17/19  Yes [provider]  LUMIGAN 0.01 % SOLN Place 1 drop into the left eye at bedtime.  01/17/19  Yes [provider]  metFORMIN (GLUCOPHAGE) 500 MG tablet Take 1 tablet (500 mg total) by mouth 2 (two) times daily with a meal. 01/08/19 07/01/20 Yes Enid Baas, MD  ACCU-CHEK AVIVA PLUS test strip  04/24/19   [provider]  ACETAZOLAMIDE PO Take by mouth 2 (two)  times daily. Patient not taking: Reported on 12/27/2020    [provider]    Family History  Problem Relation Age of Onset  . Heart disease Mother      Social History   Tobacco Use  . Smoking status: Never Smoker  . Smokeless tobacco: Never Used  Vaping Use  . Vaping Use: Never used  Substance Use Topics  . Alcohol use: Yes    Comment: OCCAS  . Drug use: Never    Allergies as of 12/27/2020 - Review Complete 12/27/2020  Allergen Reaction Noted  . Other  07/01/2020    Review of Systems:    All systems reviewed and negative except where noted in HPI.   Physical Exam:  Vital signs in last 24 hours: Temp:  [98.4 F (36.9 C)-99.8 F (37.7 C)] 99.2 F (37.3 C) (03/28 0824) Pulse Rate:  [90-138] 105 (03/28 0824) Resp:  [18-27] 20 (03/28 0824) BP: (97-138)/(58-101) 116/79 (03/28 0824) SpO2:  [95 %-98 %] 97 % (03/28 0824) Weight:  [88.9 kg-90.7 kg] 90.7 kg (03/27 2336)   General:   Pleasant, cooperative in NAD Head:  Normocephalic and atraumatic.  Gentle nodding from side-to-side noted during interview Eyes:   No icterus.   Conjunctiva pink. PERRLA. Ears:  Normal auditory acuity. Neck:  Supple; no masses or thyroidomegaly Lungs: Respirations even and unlabored. Lungs clear to auscultation bilaterally.   No wheezes, crackles, or rhonchi.  Heart:  Regular rate and rhythm;  Without murmur, clicks, rubs or gallops Abdomen:  Soft, nondistended, nontender. Normal bowel sounds. No appreciable masses or hepatomegaly.  No rebound or guarding.  Neurologic:  Alert and oriented x3;  grossly normal neurologically. Skin:  Intact without significant lesions or rashes. Cervical Nodes:  No significant cervical adenopathy. Psych:  Alert and cooperative. Normal affect.  LAB RESULTS: Recent Labs    12/27/20 1841 12/28/20 0502  WBC 12.3* 11.7*  HGB 13.7 11.5*  HCT 41.5 34.3*  PLT 237 216   BMET Recent Labs    12/27/20 1841 12/28/20 0502  NA 137 136  K 2.9* 3.0*  CL  100 105  CO2 21* 16*  GLUCOSE 441* 295*  BUN 73* 80*  CREATININE 6.56* 6.81*  CALCIUM 8.3* 7.9*   LFT Recent Labs    12/27/20 1841  PROT 7.9  ALBUMIN 3.3*  AST 38  ALT 21  ALKPHOS 58  BILITOT 0.7   PT/INR Recent Labs    12/27/20 1841  LABPROT 14.3  INR 1.2    STUDIES: US RENAL  Result Date: 12/28/2020 CLINICAL DATA:  Acute kidney injury EXAM: RENAL / URINARY TRACT ULTRASOUND COMPLETE COMPARISON:  None. FINDINGS: Right Kidney: Renal measurements: 12.5 x 6.7 x 7.5 cm = volume: 328 mL. Moderate hydronephrosis. No renal mass. No visualized shadowing stone. Left Kidney: Renal measurements: 11.1 x 4.6 x 5.1 cm = volume: Is 138 mL. Echogenicity within normal limits. No mass or hydronephrosis visualized. Bladder: Appears normal for degree  of bladder distention. Patient unable to void at time of examination. Other: None. IMPRESSION: Moderate right hydronephrosis. Electronically Signed   By: Deatra Robinson M.D.   On: 12/28/2020 00:56   DG Chest Portable 1 View  Result Date: 12/27/2020 CLINICAL DATA:  Shortness of breath EXAM: PORTABLE CHEST 1 VIEW COMPARISON:  None. FINDINGS: Cardiac shadow is within normal limits. Elevation of the right hemidiaphragm is noted. No focal infiltrate or effusion is seen. No acute bony abnormality is noted. IMPRESSION: No acute abnormality seen. Electronically Signed   By: Alcide Clever M.D.   On: 12/27/2020 19:08      Impression / Plan:   Cynthia Lucas is a 69 y.o. y/o female with is admitted with acute nausea vomiting and nonbloody diarrhea.  I have been consulted for a GI bleed.  Patient's admission has been complicated by AKI, metabolic acidosis, hyperglycemia and the patient was noted to be in atrial fibrillation with RVR.  Elevated troponin.  Hemoglobin is close to baseline at around 11 g.  Microcytosis has been noted.  Apart from hematemesis no other overt blood loss reported.  Plan 1.  Patient needs to be rehydrated, stabilized electrolytes  normalized.  Once stabilized had a cardiac standpoint can consider endoscopic evaluation.  Differentials include Mallory-Weiss tear versus gastritis which could be postinfectious.  2.  Monitor bowel movements if the diarrhea was infectious expected to resolve soon.  3.  IV PPI, monitor CBC and transfuse as needed.  4.  Check iron studies B12 folate in view of microcytosis   Thank you for involving me in the care of this patient.      LOS: 1 day   Wyline Mood, MD  12/28/2020, 8:56 AM

## 2020-12-28 NOTE — Progress Notes (Signed)
Initial Nutrition Assessment  RD working remotely.  DOCUMENTATION CODES:   Obesity unspecified  INTERVENTION:   - Boost Breeze po TID, each supplement provides 250 kcal and 9 grams of protein  - Liberalize diet to GI soft (easy to digest)  NUTRITION DIAGNOSIS:   Increased nutrient needs related to acute illness (AKI) as evidenced by estimated needs.  GOAL:   Patient will meet greater than or equal to 90% of their needs  MONITOR:   PO intake,Supplement acceptance,Labs,Weight trends,I & O's  REASON FOR ASSESSMENT:   Malnutrition Screening Tool    ASSESSMENT:   70 year old female who presented to the ED on 3/27 with N/V/D, weakness, atrial fibrillation. PMH of HTN, GERD, T2DM, sleep apnea. Pt admitted with GI bleed, AKI on CKD stage IIIa.  Pt currently on a renal diet with 1200 ml fluid restriction. No PO intake documented at this time. Discussed changing diet to GI soft with MD who agreed. Verbal with readback order placed for soft diet.  Attempted to speak with pt via phone call to room. Someone answered the phone but did not identify who they were. RD asked to speak with pt but the person who answered the phone stated that the pt was on the bedside commode and could not talk right now then disconnected the phone call. RD will attempt to obtain diet and weight history on follow-up.  Reviewed available weight history in chart. Pt with a 6.4 kg weight loss since 07/07/20. This is a 6.5% weight loss in just under 6 months which is not significant for timeframe but is concerning.  Given N/V/D on admission, suspect pt with poor PO intake PTA. RD will order clear liquid oral nutrition supplement to aid pt in meeting kcal and protein needs.  Medications reviewed and include: SSI, lantus 8 units daily, klor-con 40 mEq once, cardizem drip, protonix drip, IV KCl 10 mEq x 2 runs IVF: sodium bicarb @ 125 ml/hr  Labs reviewed: potassium 3.0, BUN 80, creatinine 6.81  Reviewed I/O's.  No UOP documented since admission.  NUTRITION - FOCUSED PHYSICAL EXAM:  Unable to complete at this time. RD working remotely.  Diet Order:   Diet Order            DIET SOFT Room service appropriate? Yes; Fluid consistency: Thin  Diet effective now                 EDUCATION NEEDS:   No education needs have been identified at this time  Skin:  Skin Assessment: Reviewed RN Assessment  Last BM:  12/28/20 type 7  Height:   Ht Readings from Last 1 Encounters:  12/27/20 5\' 7"  (1.702 m)    Weight:   Wt Readings from Last 1 Encounters:  12/28/20 91.6 kg    BMI:  Body mass index is 31.63 kg/m.  Estimated Nutritional Needs:   Kcal:  1750-1950  Protein:  85-100 grams  Fluid:  1.7-1.9 L    12/30/20, MS, RD, LDN Inpatient Clinical Dietitian Please see [facility] for contact information.

## 2020-12-28 NOTE — Anesthesia Preprocedure Evaluation (Addendum)
Anesthesia Evaluation   Patient awake  General Assessment Comment:Patient presented with diarrhea, nausea/vomiting, admitted to coffee ground emesis, found to have AKI with creatinine 6.9. Also new onset Afib with RVR. Scheduled for a cystoscopy at 1730. Cardiology consult obtained for new onset afib/RVR, they recommended echo prior to proceeding with a surgery. Informed urologist around @1530  about this cardiology recommendation and Dr said he deemed the case an emergency and would not wait for the echo results, however he would wait until the previously scheduled time of 1730.  Reviewed: Allergy & Precautions, NPO status , Patient's Chart, lab work & pertinent test results  History of Anesthesia Complications Negative for: history of anesthetic complications  Airway Mallampati: II  TM Distance: >3 FB Neck ROM: Full    Dental no notable dental hx. (+) Teeth Intact   Pulmonary sleep apnea (does not wear CPAP because she doesn't like it) , neg COPD,    Pulmonary exam normal breath sounds clear to auscultation- rhonchi (-) wheezing      Cardiovascular hypertension, Pt. on medications (-) CAD, (-) Past MI, (-) Cardiac Stents and (-) CABG + dysrhythmias  Rhythm:Irregular Rate:Tachycardia - Systolic murmurs and - Diastolic murmurs Afib, HR in 100s   Neuro/Psych neg Seizures Head tremors negative psych ROS   GI/Hepatic Neg liver ROS, GERD  ,  Endo/Other  diabetes, Poorly Controlled, Oral Hypoglycemic Agents  Renal/GU ARFRenal disease     Musculoskeletal negative musculoskeletal ROS (+)   Abdominal (+) + obese,   Peds  Hematology  (+) REFUSES BLOOD PRODUCTS, Patient refuses all blood products and albumin even if it means her death.    Anesthesia Other Findings Past Medical History: No date: Cough     Comment:  CHRONIC No date: Diabetes mellitus without complication (HCC) No date: GERD (gastroesophageal reflux  disease) No date: Hypertension No date: Sleep apnea     Comment:  no cpap No date: Tremor     Comment:  HEAD   Reproductive/Obstetrics                            Anesthesia Physical  Anesthesia Plan  ASA: III and emergent  Anesthesia Plan: General   Post-op Pain Management:    Induction: Intravenous and Rapid sequence  PONV Risk Score and Plan: 4 or greater and Ondansetron and Dexamethasone  Airway Management Planned: Oral ETT  Additional Equipment: None  Intra-op Plan:   Post-operative Plan: Extubation in OR  Informed Consent: I have reviewed the patients History and Physical, chart, labs and discussed the procedure including the risks, benefits and alternatives for the proposed anesthesia with the patient or authorized representative who has indicated his/her understanding and acceptance.     Dental advisory given  Plan Discussed with: CRNA and Surgeon  Anesthesia Plan Comments: (Discussed risks of anesthesia with patient, including PONV, sore throat, lip/dental damage. Rare risks discussed as well, such as cardiorespiratory and neurological sequelae. Patient understands. Patient counseled on being higher risk for anesthesia due to comorbidities: new onset Afib with RVR, AKI. Patient was told about increased risk of cardiac and respiratory events, including death. Patient understands. )        Anesthesia Quick Evaluation

## 2020-12-28 NOTE — Consult Note (Signed)
[facility]  CONSULT NOTE    Date: 12/28/2020                  Patient Name:  Cynthia Lucas  MRN: [id number]  DOB: 04/13/1951  Age / Sex: 70 y.o., female         PCP: Toy Cookey, FNP                 Service Requesting Consult: Dr. Chipper Herb                 Reason for Consult: Acute kidney injury            History of Present Illness: Ms. Cynthia Lucas admitted to [facility] on 3/27 with nausea, vomiting, diarrhea, coffee ground emesis and found tohave headach and cramping. Patient was found to have hypotension and irregular tachycarida on admission. She was started on diltiazem gtt and pantoprazole gtt. Nephrology consulted for acute kidney injury. Patient states that she has been told her kidney function was stable prior to this. Most recent labs show creatinine of 1.26, GFR of 44 in April 2020.    Medications: Outpatient medications: Medications Prior to Admission  Medication Sig Dispense Refill Last Dose  . atorvastatin (LIPITOR) 40 MG tablet Take 40 mg by mouth at bedtime.    Unknown at Unknown  . brimonidine-timolol (COMBIGAN) 0.2-0.5 % ophthalmic solution Place 1 drop into both eyes every 12 (twelve) hours.   Unknown at Unknown  . hydrochlorothiazide (HYDRODIURIL) 25 MG tablet Take 25 mg by mouth daily.    Unknown at Unknown  . LUMIGAN 0.01 % SOLN Place 1 drop into the left eye at bedtime.    Unknown at Unknown  . metFORMIN (GLUCOPHAGE) 500 MG tablet Take 1 tablet (500 mg total) by mouth 2 (two) times daily with a meal. 60 tablet 2 Unknown at Unknown  . ACCU-CHEK AVIVA PLUS test strip      . ACETAZOLAMIDE PO Take by mouth 2 (two) times daily. (Patient not taking: Reported on 12/27/2020)   Not Taking at Unknown time    Current medications: Current Facility-Administered Medications  Medication Dose Route Frequency Provider Last Rate Last Admin  . acetaminophen (TYLENOL) tablet 650 mg  650 mg Oral Q6H PRN Mansy, Jan A, MD       Or  . acetaminophen (TYLENOL)  suppository 650 mg  650 mg Rectal Q6H PRN Mansy, Jan A, MD      . atorvastatin (LIPITOR) tablet 40 mg  40 mg Oral QHS Mansy, Jan A, MD   40 mg at 12/28/20 0136  . brimonidine (ALPHAGAN) 0.2 % ophthalmic solution 1 drop  1 drop Both Eyes BID Otelia Sergeant, RPH   1 drop at 12/28/20 7096   And  . timolol (TIMOPTIC) 0.5 % ophthalmic solution 1 drop  1 drop Both Eyes BID Otelia Sergeant, RPH   1 drop at 12/28/20 0935  . diltiazem (CARDIZEM) 125 mg in dextrose 5% 125 mL (1 mg/mL) infusion  5-15 mg/hr Intravenous Continuous Concha Se, MD 5 mL/hr at 12/27/20 2153 5 mg/hr at 12/27/20 2153  . latanoprost (XALATAN) 0.005 % ophthalmic solution 1 drop  1 drop Left Eye QHS Mansy, Jan A, MD   1 drop at 12/28/20 0144  . ondansetron (ZOFRAN) tablet 4 mg  4 mg Oral Q6H PRN Mansy, Jan A, MD       Or  . ondansetron [facility]) injection 4 mg  4 mg Intravenous Q6H PRN Mansy, Vernetta Honey, MD      .  pantoprazole (PROTONIX) 80 mg in sodium chloride 0.9 % 100 mL (0.8 mg/mL) infusion  8 mg/hr Intravenous Continuous Concha Se, MD 10 mL/hr at 12/28/20 0748 8 mg/hr at 12/28/20 0748  . [START ON 12/31/2020] pantoprazole (PROTONIX) injection 40 mg  40 mg Intravenous Q12H Concha Se, MD      . potassium chloride (KLOR-CON) packet 40 mEq  40 mEq Oral Once Mansy, Jan A, MD      . potassium chloride 10 mEq in 100 mL IVPB  10 mEq Intravenous Q1 Hr x 2 Zhang, Dekui, MD      . sodium bicarbonate 150 mEq in dextrose 5 % 1,000 mL infusion   Intravenous Continuous Marrion Coy, MD      . traZODone (DESYREL) tablet 25 mg  25 mg Oral QHS PRN Mansy, Vernetta Honey, MD          Allergies: Allergies  Allergen Reactions  . Other     Contact lense solution causes eye irritation      Past Medical History: Past Medical History:  Diagnosis Date  . Cough    CHRONIC  . Diabetes mellitus without complication (HCC)   . GERD (gastroesophageal reflux disease)   . History of kidney stones   . Hypertension   . Sleep apnea    no cpap  . Tremor     HEAD     Past Surgical History: Past Surgical History:  Procedure Laterality Date  . ABDOMINAL HYSTERECTOMY    . CATARACT EXTRACTION W/PHACO Left 03/07/2018   Procedure: CATARACT EXTRACTION PHACO AND INTRAOCULAR LENS PLACEMENT (IOC);  Surgeon: Galen Manila, MD;  Location: [facility];  Service: Ophthalmology;  Laterality: Left;  Lot # [id number] H [place]: 04:19.8 AP%: 19.9 CDE: 51.70  . CATARACT EXTRACTION W/PHACO Right 07/07/2020   Procedure: CATARACT EXTRACTION PHACO AND INTRAOCULAR LENS PLACEMENT (IOC) RIGHT DIABETIC 7.75 00:42.8;  Surgeon: Galen Manila, MD;  Location: [facility];  Service: Ophthalmology;  Laterality: Right;  diabetic - oral meds  . CESAREAN SECTION    . COLONOSCOPY    . CYSTOSCOPY W/ RETROGRADES Right 01/07/2019   Procedure: CYSTOSCOPY WITH RETROGRADE PYELOGRAM;  Surgeon: Riki Altes, MD;  Location: [facility];  Service: Urology;  Laterality: Right;  . CYSTOSCOPY WITH STENT PLACEMENT Right 01/07/2019   Procedure: CYSTOSCOPY WITH STENT PLACEMENT;  Surgeon: Riki Altes, MD;  Location: [facility];  Service: Urology;  Laterality: Right;  . CYSTOSCOPY WITH URETEROSCOPY, STONE BASKETRY AND STENT PLACEMENT Right 01/29/2019   Procedure: CYSTOSCOPY/URETEROSCOPY/HOLMIUM LASER/STENT EXCHANGE;  Surgeon: Riki Altes, MD;  Location: [facility];  Service: Urology;  Laterality: Right;  . JOINT REPLACEMENT       Family History: Family History  Problem Relation Age of Onset  . Heart disease Mother      Social History: Social History   Socioeconomic History  . Marital status: Divorced    Spouse name: Not on file  . Number of children: Not on file  . Years of education: Not on file  . Highest education level: Not on file  Occupational History  . Not on file  Tobacco Use  . Smoking status: Never Smoker  . Smokeless tobacco: Never Used  Vaping Use  . Vaping Use: Never used  Substance and Sexual Activity  . Alcohol use: Yes    Comment: OCCAS  . Drug  use: Never  . Sexual activity: Yes    Birth control/protection: None  Other Topics Concern  . Not on file  Social History Narrative  .  Not on file   Social Determinants of Health   Financial Resource Strain: Not on file  Food Insecurity: Not on file  Transportation Needs: Not on file  Physical Activity: Not on file  Stress: Not on file  Social Connections: Not on file  Intimate Partner Violence: Not on file     Review of Systems: ROS  Vital Signs: Blood pressure 116/79, pulse (!) 105, temperature 99.2 F (37.3 C), temperature source Oral, resp. rate 20, height 5\' 7"  (1.702 m), weight 91.6 kg, SpO2 97 %.  Weight trends: Filed Weights   12/27/20 1841 12/27/20 2336 12/28/20 0900  Weight: 88.9 kg 90.7 kg 91.6 kg    Physical Exam: General: NAD,   Head: Normocephalic, atraumatic. Moist oral mucosal membranes  Eyes: Anicteric, PERRL  Neck: Supple, trachea midline  Lungs:  Clear to auscultation  Heart: +irregular  Abdomen:  Soft, nontender,   Extremities: No peripheral edema.  Neurologic: +tremor  Skin: No lesions  Access: none     Lab results: Basic Metabolic Panel: Recent Labs  Lab 12/27/20 1841 12/27/20 2039 12/28/20 0502  NA 137  --  136  K 2.9*  --  3.0*  CL 100  --  105  CO2 21*  --  16*  GLUCOSE 441*  --  295*  BUN 73*  --  80*  CREATININE 6.56*  --  6.81*  CALCIUM 8.3*  --  7.9*  MG 2.2 2.1  --     Liver Function Tests: Recent Labs  Lab 12/27/20 1841  AST 38  ALT 21  ALKPHOS 58  BILITOT 0.7  PROT 7.9  ALBUMIN 3.3*   No results for input(s): LIPASE, AMYLASE in the last 168 hours. No results for input(s): AMMONIA in the last 168 hours.  CBC: Recent Labs  Lab 12/27/20 1841 12/28/20 0502  WBC 12.3* 11.7*  NEUTROABS 10.9*  --   HGB 13.7 11.5*  HCT 41.5 34.3*  MCV 79.2* 78.1*  PLT 237 216    Cardiac Enzymes: No results for input(s): CKTOTAL, CKMB, CKMBINDEX, TROPONINI in the last 168 hours.  BNP: Invalid input(s):  POCBNP  CBG: No results for input(s): GLUCAP in the last 168 hours.  Microbiology: Results for orders placed or performed during the hospital encounter of 12/27/20  Resp Panel by RT-PCR (Flu A&B, Covid) Nasopharyngeal Swab     Status: None   Collection Time: 12/27/20  6:48 PM   Specimen: Nasopharyngeal Swab; Nasopharyngeal(NP) swabs in vial transport medium  Result Value Ref Range Status   SARS Coronavirus 2 by RT PCR NEGATIVE NEGATIVE Final    Comment: (NOTE) SARS-CoV-2 target nucleic acids are NOT DETECTED.  The SARS-CoV-2 RNA is generally detectable in upper respiratory specimens during the acute phase of infection. The lowest concentration of SARS-CoV-2 viral copies this assay can detect is 138 copies/mL. A negative result does not preclude SARS-Cov-2 infection and should not be used as the sole basis for treatment or other patient management decisions. A negative result may occur with  improper specimen collection/handling, submission of specimen other than nasopharyngeal swab, presence of viral mutation(s) within the areas targeted by this assay, and inadequate number of viral copies(<138 copies/mL). A negative result must be combined with clinical observations, patient history, and epidemiological information. The expected result is Negative.  Fact Sheet for Patients:  BloggerCourse.comhttps://www.fda.gov/media/152166/download  Fact Sheet for Healthcare Providers:  SeriousBroker.ithttps://www.fda.gov/media/152162/download  This test is no t yet approved or cleared by the [place] FDA and  has been authorized for detection and/or  diagnosis of SARS-CoV-2 by FDA under an Emergency Use Authorization (EUA). This EUA will remain  in effect (meaning this test can be used) for the duration of the COVID-19 declaration under Section 564(b)(1) of the Act, 21 U.S.C.section 360bbb-3(b)(1), unless the authorization is terminated  or revoked sooner.       Influenza A by PCR NEGATIVE NEGATIVE Final    Influenza B by PCR NEGATIVE NEGATIVE Final    Comment: (NOTE) The Xpert Xpress SARS-CoV-2/FLU/RSV plus assay is intended as an aid in the diagnosis of influenza from Nasopharyngeal swab specimens and should not be used as a sole basis for treatment. Nasal washings and aspirates are unacceptable for Xpert Xpress SARS-CoV-2/FLU/RSV testing.  Fact Sheet for Patients: BloggerCourse.com  Fact Sheet for Healthcare Providers: SeriousBroker.it  This test is not yet approved or cleared by the [place] FDA and has been authorized for detection and/or diagnosis of SARS-CoV-2 by FDA under an Emergency Use Authorization (EUA). This EUA will remain in effect (meaning this test can be used) for the duration of the COVID-19 declaration under Section 564(b)(1) of the Act, 21 U.S.C. section 360bbb-3(b)(1), unless the authorization is terminated or revoked.  Performed at [facility], [address] [place], [place] [address]   Gastrointestinal Panel by PCR , Stool     Status: None   Collection Time: 12/27/20  8:39 PM   Specimen: Stool  Result Value Ref Range Status   Campylobacter species NOT DETECTED NOT DETECTED Final   Plesimonas shigelloides NOT DETECTED NOT DETECTED Final   Salmonella species NOT DETECTED NOT DETECTED Final   Yersinia enterocolitica NOT DETECTED NOT DETECTED Final   Vibrio species NOT DETECTED NOT DETECTED Final   Vibrio cholerae NOT DETECTED NOT DETECTED Final   Enteroaggregative E coli (EAEC) NOT DETECTED NOT DETECTED Final   Enteropathogenic E coli (EPEC) NOT DETECTED NOT DETECTED Final   Enterotoxigenic E coli (ETEC) NOT DETECTED NOT DETECTED Final   Shiga like toxin producing E coli (STEC) NOT DETECTED NOT DETECTED Final   Shigella/Enteroinvasive E coli (EIEC) NOT DETECTED NOT DETECTED Final   Cryptosporidium NOT DETECTED NOT DETECTED Final   Cyclospora cayetanensis NOT DETECTED NOT DETECTED Final    Entamoeba histolytica NOT DETECTED NOT DETECTED Final   Giardia lamblia NOT DETECTED NOT DETECTED Final   Adenovirus F40/41 NOT DETECTED NOT DETECTED Final   Astrovirus NOT DETECTED NOT DETECTED Final   Norovirus GI/GII NOT DETECTED NOT DETECTED Final   Rotavirus A NOT DETECTED NOT DETECTED Final   Sapovirus (I, II, IV, and V) NOT DETECTED NOT DETECTED Final    Comment: Performed at [facility], [address] [place], [place] [address]  C Difficile Quick Screen w PCR reflex     Status: None   Collection Time: 12/27/20  8:39 PM   Specimen: Stool  Result Value Ref Range Status   C Diff antigen NEGATIVE NEGATIVE Final   C Diff toxin NEGATIVE NEGATIVE Final   C Diff interpretation No C. difficile detected.  Final    Comment: Performed at [facility], [address] [place], [place] [address]    Coagulation Studies: Recent Labs    12/27/20 1841  LABPROT 14.3  INR 1.2    Urinalysis: No results for input(s): COLORURINE, LABSPEC, PHURINE, GLUCOSEU, HGBUR, BILIRUBINUR, KETONESUR, PROTEINUR, UROBILINOGEN, NITRITE, LEUKOCYTESUR in the last 72 hours.  Invalid input(s): APPERANCEUR    Imaging: US RENAL  Result Date: 12/28/2020 CLINICAL DATA:  Acute kidney injury EXAM: RENAL / URINARY TRACT ULTRASOUND  COMPLETE COMPARISON:  None. FINDINGS: Right Kidney: Renal measurements: 12.5 x 6.7 x 7.5 cm = volume: 328 mL. Moderate hydronephrosis. No renal mass. No visualized shadowing stone. Left Kidney: Renal measurements: 11.1 x 4.6 x 5.1 cm = volume: Is 138 mL. Echogenicity within normal limits. No mass or hydronephrosis visualized. Bladder: Appears normal for degree of bladder distention. Patient unable to void at time of examination. Other: None. IMPRESSION: Moderate right hydronephrosis. Electronically Signed   By: Deatra Robinson M.D.   On: 12/28/2020 00:56   DG Chest Portable 1 View  Result Date: 12/27/2020 CLINICAL DATA:  Shortness of breath EXAM: PORTABLE CHEST 1 VIEW  COMPARISON:  None. FINDINGS: Cardiac shadow is within normal limits. Elevation of the right hemidiaphragm is noted. No focal infiltrate or effusion is seen. No acute bony abnormality is noted. IMPRESSION: No acute abnormality seen. Electronically Signed   By: Alcide Clever M.D.   On: 12/27/2020 19:08      Assessment & Plan: Ms. Iisha Soyars is a 70 y.o. black female with diabetes mellitus type II, hypertension, tremor, sleep apnea, GERD, who was admitted to [facility] on 12/27/2020 for Gastroenteritis [K52.9] GI bleeding [K92.2] Atrial fibrillation with rapid ventricular response (HCC) [I48.91] AKI (acute kidney injury) (HCC) [N17.9]  1. Acute kidney injury on chronic kidney disease stage IIIB with proteinuria: baseline creatinine 1.26, GFR of 44 01/08/2019.  Hydronephrosis on imaging.  - Consult urology - Unclear if this is prerenal azotemia versus progression of disease. No acute indication for dialysis. Discussed dialysis with patient.  - Continue IV fluids.   2. Hypertension: with atrial fibrillation with rapid ventricular response - diltiazem gtt. Appreciate cardiology input.   3. Metabolic acidosis - IV bicarb infusion  4. Hypokalemia: consistent with GI losses.  - IV replacement.   LOS: 1 Muriel Wilber 3/28/202210:24 AM

## 2020-12-28 NOTE — Transfer of Care (Addendum)
Immediate Anesthesia Transfer of Care Note  [name]  Procedure(s) Performed: CYSTOSCOPY/URETEROSCOPY/STENT PLACEMENT (Right )  Patient Location: PACU  Anesthesia Type:General  Level of Consciousness: drowsy  Airway & Oxygen Therapy: Patient Spontanous Breathing and Patient connected to face mask oxygen  Post-op Assessment: Report given to RN and Post -op Vital signs reviewed and stable  Post vital signs: Reviewed and stable  Last Vitals:  Vitals Value Taken Time  BP 93/57   Temp    Pulse 101   Resp 16   SpO2 95     Last Pain:  Vitals:   12/28/20 1658  TempSrc: Oral  PainSc:          Complications: No complications documented.

## 2020-12-28 NOTE — Plan of Care (Signed)
  Problem: Education: Goal: Knowledge of General Education information will improve Description: Including pain rating scale, medication(s)/side effects and non-pharmacologic comfort measures Outcome: Progressing   Problem: Clinical Measurements: Goal: Ability to maintain clinical measurements within normal limits will improve Outcome: Progressing   

## 2020-12-28 NOTE — Anesthesia Procedure Notes (Signed)
Procedure Name: Intubation Date/Time: 12/28/2020 5:52 PM Performed by: Joanette Gula, Aleksandra Raben, CRNA Pre-anesthesia Checklist: Patient identified, Emergency Drugs available, Suction available and Patient being monitored Patient Re-evaluated:Patient Re-evaluated prior to induction Oxygen Delivery Method: Circle system utilized Preoxygenation: Pre-oxygenation with 100% oxygen Induction Type: IV induction Ventilation: Mask ventilation without difficulty Laryngoscope Size: McGraph and 3 Grade View: Grade I Tube type: Oral Tube size: 6.5 mm Number of attempts: 1 Airway Equipment and Method: Stylet Placement Confirmation: ETT inserted through vocal cords under direct vision,  positive ETCO2 and breath sounds checked- equal and bilateral Secured at: 20 cm Tube secured with: Tape Dental Injury: Teeth and Oropharynx as per pre-operative assessment

## 2020-12-28 NOTE — Consult Note (Addendum)
Cardiology Consultation:   Patient ID: Cynthia Lucas MRN: [id number]; DOB: Jan 28, 1951  Admit date: 12/27/2020 Date of Consult: 12/28/2020  PCP:  Cynthia Cookey, FNP   [facility]  Cardiologist: [facility], Dr. Kirke Corin rounding Advanced Practice Provider:  No care team member to display Electrophysiologist:  None [id number]}    Patient Profile:   Cynthia Lucas is a 70 y.o. female with history of hypertension, GERD, DM2, sleep apnea not on CPAP, tremors, and no known history of CAD or arrhythmia, and who is being seen today for the evaluation of new onset atrial fibrillation in the setting of GI bleed/AKI with need for preop evaluation at the request of Dr. Chipper Lucas .  History of Present Illness:   Cynthia Lucas is a 70 year old female with PMH as above and including sleep apnea.  She has family history of heart failure in her mother.  She reports her mother is deceased as of 53 from noncardiac etiology.  She does have a diagnosis of sleep apnea and a CPAP at home but does not use it.  She reportedly takes it off at night while she is sleeping.  She occasionally uses alcohol.  She lives at home with her son.  She does not use a cane or walker to get around.  She presented to [facility] emergency department after feeling sharp left flank pain and coffee-ground emesis.  She also reports melena and lower abdominal cramps/painful bowel movements.  Diarrhea was reported.  She recently reports tachypalpitations, described as feeling as if a " issue is fluttering in her chest."  She reports one episode of these palpitations and that they have since resolved.  No chest pain, shortness of breath, presyncope, or reported signs or symptoms of volume overload.  Initial labs significant for hypokalemia with potassium 2.9  3.0, AOCKD with creatinine 6.56  6.81, BUN 73, AST 38, ALT 21, calcium 8.3, BNP 530.7, hemoglobin A1c 7.9, high-sensitivity troponin T 41, hemoglobin 13.7  11.5, hematocrit 41.5   34.3, TSH WNL, respiratory panel negative, stool sample negative for C. Difficile.  She was started on IV Protonix for a GI bleed and IV hydration.  Atrial fibrillation with RVR was found at presentation with patient started on IV Cardizem but not anticoagulation due to GI bleed.  3/28 she was evaluated by GI with recommendation for rehydration and electrolyte repletion.  Endoscopic he was recommended once cardiology evaluated.   She was subsequently evaluated by urology with recommendation for cystoscopy and likely placement of stent or a right perc tube given nephrolithiasis seen on imaging.    Nephrology has also been consulted in the setting of her AKI and electrolyte depletion.  She is very tender to palpation of her entire abdomen today and reports frequent dry cough.  Past Medical History:  Diagnosis Date   Cough    CHRONIC   Diabetes mellitus without complication (HCC)    GERD (gastroesophageal reflux disease)    History of kidney stones    Hypertension    Sleep apnea    no cpap   Tremor    HEAD    Past Surgical History:  Procedure Laterality Date   ABDOMINAL HYSTERECTOMY     CATARACT EXTRACTION W/PHACO Left 03/07/2018   Procedure: CATARACT EXTRACTION PHACO AND INTRAOCULAR LENS PLACEMENT (IOC);  Surgeon: Galen Manila, MD;  Location: [facility] ORS;  Service: Ophthalmology;  Laterality: Left;  Lot # [id number] H [place]: 04:19.8 AP%: 19.9 CDE: 51.70   CATARACT EXTRACTION W/PHACO Right 07/07/2020   Procedure: CATARACT EXTRACTION  PHACO AND INTRAOCULAR LENS PLACEMENT (IOC) RIGHT DIABETIC 7.75 00:42.8;  Surgeon: Galen Manila, MD;  Location: [facility];  Service: Ophthalmology;  Laterality: Right;  diabetic - oral meds   CESAREAN SECTION     COLONOSCOPY     CYSTOSCOPY W/ RETROGRADES Right 01/07/2019   Procedure: CYSTOSCOPY WITH RETROGRADE PYELOGRAM;  Surgeon: Riki Altes, MD;  Location: [facility] ORS;  Service: Urology;  Laterality: Right;   CYSTOSCOPY WITH STENT  PLACEMENT Right 01/07/2019   Procedure: CYSTOSCOPY WITH STENT PLACEMENT;  Surgeon: Riki Altes, MD;  Location: [facility] ORS;  Service: Urology;  Laterality: Right;   CYSTOSCOPY WITH URETEROSCOPY, STONE BASKETRY AND STENT PLACEMENT Right 01/29/2019   Procedure: CYSTOSCOPY/URETEROSCOPY/HOLMIUM LASER/STENT EXCHANGE;  Surgeon: Riki Altes, MD;  Location: [facility] OR[facility];  Service: Urology;  Laterality: Right;   JOINT REPLACEMENT       Home Medications:  Prior to Admission medications   Medication Sig Start Date End Date Taking? Authorizing Provider  atorvastatin (LIPITOR) 40 MG tablet Take 40 mg by mouth at bedtime.    Yes [provider]  brimonidine-timolol (COMBIGAN) 0.2-0.5 % ophthalmic solution Place 1 drop into both eyes every 12 (twelve) hours.   Yes [provider]  hydrochlorothiazide (HYDRODIURIL) 25 MG tablet Take 25 mg by mouth daily.  01/17/19  Yes [provider]  LUMIGAN 0.01 % SOLN Place 1 drop into the left eye at bedtime.  01/17/19  Yes [provider]  metFORMIN (GLUCOPHAGE) 500 MG tablet Take 1 tablet (500 mg total) by mouth 2 (two) times daily with a meal. 01/08/19 07/01/20 Yes Enid Baas, MD  ACCU-CHEK AVIVA PLUS test strip  04/24/19   [provider]  ACETAZOLAMIDE PO Take by mouth 2 (two) times daily. Patient not taking: Reported on 12/27/2020    [provider]    Inpatient Medications: Scheduled Meds:  atorvastatin  40 mg Oral QHS   brimonidine  1 drop Both Eyes BID   And   timolol  1 drop Both Eyes BID   feeding supplement  1 Container Oral TID BM   insulin aspart  0-15 Units Subcutaneous TID WC   insulin aspart  0-5 Units Subcutaneous QHS   insulin glargine  8 Units Subcutaneous Daily   latanoprost  1 drop Left Eye QHS   [START ON 12/31/2020] pantoprazole  40 mg Intravenous Q12H   potassium chloride  40 mEq Oral Once   Continuous Infusions:  diltiazem (CARDIZEM) infusion 5 mg/hr (12/27/20 2153)    pantoprozole (PROTONIX) infusion 8 mg/hr (12/28/20 0748)   sodium bicarbonate (isotonic) 150 mEq in D5W 1000 mL infusion 125 mL/hr at 12/28/20 1423   PRN Meds: acetaminophen **OR** acetaminophen, ondansetron **OR** ondansetron (ZOFRAN) IV, traZODone  Allergies:    Allergies  Allergen Reactions   Other     Contact lense solution causes eye irritation    Social History:   Social History   Socioeconomic History   Marital status: Divorced    Spouse name: Not on file   Number of children: Not on file   Years of education: Not on file   Highest education level: Not on file  Occupational History   Not on file  Tobacco Use   Smoking status: Never Smoker   Smokeless tobacco: Never Used  Vaping Use   Vaping Use: Never used  Substance and Sexual Activity   Alcohol use: Yes    Comment: OCCAS   Drug use: Never   Sexual activity: Yes    Birth control/protection: None  Other Topics Concern   Not on file  Social History Narrative   Not on file   Social Determinants of Health   Financial Resource Strain: Not on file  Food Insecurity: Not on file  Transportation Needs: Not on file  Physical Activity: Not on file  Stress: Not on file  Social Connections: Not on file  Intimate Partner Violence: Not on file    Family History:    Family History  Problem Relation Age of Onset   Heart disease Mother      ROS:  Please see the history of present illness.  Review of Systems  Respiratory: Negative for shortness of breath.   Cardiovascular: Positive for palpitations. Negative for chest pain, orthopnea, claudication and leg swelling.  Gastrointestinal: Positive for abdominal pain, blood in stool, diarrhea, melena, nausea and vomiting.  Musculoskeletal: Negative for falls.  Neurological: Negative for loss of consciousness.  All other systems reviewed and are negative.   All other ROS reviewed and negative.     Physical Exam/Data:   Vitals:   12/28/20 0755  12/28/20 0824 12/28/20 0900 12/28/20 1109  BP: 119/70 116/79  105/80  Pulse: (!) 103 (!) 105  74  Resp: 20 20  20   Temp: 98.5 F (36.9 C) 99.2 F (37.3 C)  (!) 97.5 F (36.4 C)  TempSrc: Oral Oral  Oral  SpO2: 95% 97%  97%  Weight:   91.6 kg   Height:        Intake/Output Summary (Last 24 hours) at 12/28/2020 1455 Last data filed at 12/28/2020 0300 Gross per 24 hour  Intake 2180.68 ml  Output --  Net 2180.68 ml   Last 3 Weights 12/28/2020 12/27/2020 12/27/2020  Weight (lbs) 201 lb 15.1 oz 199 lb 15.3 oz 196 lb  Weight (kg) 91.6 kg 90.7 kg 88.905 kg     Body mass index is 31.63 kg/m.  General:  Well nourished, well developed, in no acute distress.  Head were noted HEENT: normal Lymph: no adenopathy Neck: JVD difficult to assess due to body habitus Endocrine:  No thryomegaly Vascular: No carotid bruits; FA pulses 2+ bilaterally without bruits  Cardiac:  normal S1, S2; IRIR; no murmur  Lungs:  Frequent coughing, bilateral rhonchi appreciated though difficult to have patient take a deep breath during pulmonary auscultation without eliciting a cough Abd: very TTP on exam Ext: no edema Musculoskeletal:  No deformities, BUE and BLE strength normal and equal Skin: warm and dry  Neuro:  CNs 2-12 intact, no focal abnormalities noted Psych:  Normal affect   EKG:  The EKG was personally reviewed and demonstrates: Atrial fibrillation with rapid ventricular rate, ventricular rate 157 bpm Telemetry:  Telemetry was personally reviewed and demonstrates: Atrial fibrillation, ventricular rates currently 100- 110s  Relevant CV Studies: Pending echocardiogram  Laboratory Data:  High Sensitivity Troponin:   Recent Labs  Lab 12/27/20 1841 12/27/20 2039  TROPONINIHS 241* 224*     Chemistry Recent Labs  Lab 12/27/20 1841 12/28/20 0502  NA 137 136  K 2.9* 3.0*  CL 100 105  CO2 21* 16*  GLUCOSE 441* 295*  BUN 73* 80*  CREATININE 6.56* 6.81*  CALCIUM 8.3* 7.9*  GFRNONAA 6* 6*   ANIONGAP 16* 15    Recent Labs  Lab 12/27/20 1841  PROT 7.9  ALBUMIN 3.3*  AST 38  ALT 21  ALKPHOS 58  BILITOT 0.7   Hematology Recent Labs  Lab 12/27/20 1841 12/28/20 0502  WBC 12.3* 11.7*  RBC 5.24* 4.39  HGB 13.7 11.5*  HCT 41.5 34.3*  MCV 79.2* 78.1*  MCH 26.1 26.2  MCHC 33.0 33.5  RDW 15.6* 15.4  PLT 237 216   BNP Recent Labs  Lab 12/27/20 1841  BNP 530.7*    DDimer No results for input(s): DDIMER in the last 168 hours.   Radiology/Studies:  CT ABDOMEN PELVIS WO CONTRAST  Result Date: 12/28/2020 CLINICAL DATA:  Flank pain, kidney stones suspected, right hydronephrosis by ultrasound EXAM: CT ABDOMEN AND PELVIS WITHOUT CONTRAST TECHNIQUE: Multidetector CT imaging of the abdomen and pelvis was performed following the standard protocol without IV contrast. COMPARISON:  Same day renal ultrasound, 12/28/2020 FINDINGS: Lower chest: Atelectasis and/or consolidation of the dependent right lung base. Hepatobiliary: No solid liver abnormality is seen. Hepatic steatosis. No gallstones, gallbladder wall thickening, or biliary dilatation. Pancreas: Unremarkable. No pancreatic ductal dilatation or surrounding inflammatory changes. Spleen: Normal in size without significant abnormality. Adrenals/Urinary Tract: Adrenal glands are unremarkable. Moderate right hydronephrosis with an oblong calculus at the right ureteropelvic junction measuring 2.0 cm in length. Right-sided perinephric fat stranding. No additional calculi identified. Stomach/Bowel: Stomach is within normal limits. Appendix appears normal. No evidence of bowel wall thickening, distention, or inflammatory changes. Vascular/Lymphatic: Aortic atherosclerosis. No enlarged abdominal or pelvic lymph nodes. Reproductive: Status post hysterectomy. Other: No abdominal wall hernia or abnormality. No abdominopelvic ascites. Musculoskeletal: No acute or significant osseous findings. IMPRESSION: 1. Moderate right hydronephrosis with an  oblong calculus at the right ureteropelvic junction measuring 2.0 cm in length. Right-sided perinephric fat stranding. No additional calculi identified. 2. Hepatic steatosis. Aortic Atherosclerosis (ICD10-I70.0). Electronically Signed   By: Lauralyn Primes M.D.   On: 12/28/2020 12:49   US RENAL  Result Date: 12/28/2020 CLINICAL DATA:  Acute kidney injury EXAM: RENAL / URINARY TRACT ULTRASOUND COMPLETE COMPARISON:  None. FINDINGS: Right Kidney: Renal measurements: 12.5 x 6.7 x 7.5 cm = volume: 328 mL. Moderate hydronephrosis. No renal mass. No visualized shadowing stone. Left Kidney: Renal measurements: 11.1 x 4.6 x 5.1 cm = volume: Is 138 mL. Echogenicity within normal limits. No mass or hydronephrosis visualized. Bladder: Appears normal for degree of bladder distention. Patient unable to void at time of examination. Other: None. IMPRESSION: Moderate right hydronephrosis. Electronically Signed   By: Deatra Robinson M.D.   On: 12/28/2020 00:56   DG Chest Portable 1 View  Result Date: 12/27/2020 CLINICAL DATA:  Shortness of breath EXAM: PORTABLE CHEST 1 VIEW COMPARISON:  None. FINDINGS: Cardiac shadow is within normal limits. Elevation of the right hemidiaphragm is noted. No focal infiltrate or effusion is seen. No acute bony abnormality is noted. IMPRESSION: No acute abnormality seen. Electronically Signed   By: Alcide Clever M.D.   On: 12/27/2020 19:08     Assessment and Plan:   Preoperative cardiac evaluation --Preoperative cardiac evaluation requested per urology and GI, as both side of [place] and endoscopy recommended for this patient. --No current CP or SOB.  Active cardiac conditions include new onset Afib not on OAC due to H&H. Echo still pending with no known history of HF. EKG without evidence of ST/T changes. HS Tn elevated in the setting of her comorbid conditions and peaked at 241 and likely due to supply demand ischemia. Current clinical risk factors include newly diagnosed DM2 on SSI and renal  insufficiency with Cr >2. Functional capacity 1-4 METS. Surgery specific risk low.  --Calculated RCRI Class III risk with 6.6% or MACE. As above, will obtain echo. Repeat labs ordered to recheck before procedures.  --Given necessity  of her procedures, recommend proceeding to surgery without further workup from a cardiac standpoint if echo is without acute findings and ventricular rate well controlled.    New onset atrial fibrillation with RVR --Remains in Afib. New onset atrial fibrillation of unknown duration, likely in the setting of electrolyte abnormalities with GI an urology issues as above, as well as AOCKD. Report of tachypalpitations earlier, though resolved at this time and currently asymptomatic in atrial fibrillation.  At presentation, ventricular rate 157 bpm, at which time she had symptoms.   --Rate control only at this time. Current ventricular rate controlled and below 110.  Given EF currently unknown, caution against IV Cardizem.  Recommend transitioning to oral beta-blocker until EF determined by ordered echo.  --No current OAC. CHA2DS2VASc score of at least 4 (HTN, agex1, DM, female; however, anticoagulation deferred in the setting of bleeding with history of GIB and renal stone / H&H drop. Also considered is renal function. Risks of anticoagulation currently outweigh the benefits.  No plan for DCCV at this time, given ventricular rate well controlled with patient asymptomatic and not therapeutically anticoagulated with current bleeding.  If remains in atrial fibrillation after 4 weeks of anticoagulation, could consider DCCV at that time.  Continue to assess risks and benefits of anticoagulation moving forward. --Recommend CPAP use going forward, discussed with patient.  Elevated HS Tn  --No chest pain.  As above, high-sensitivity troponin peaked to 41.  EKG without acute ST/T changes.  Suspect elevated troponin in the setting of renal function, rapid ventricular rate, GI bleed,, and  comorbid conditions as above.  No indication for IV heparin.  Would not recommend antiplatelet or anticoagulant therapy until bleeding resolved and stable H&H.  Ordered echo. No indication or plan for invasive ischemic work-up at this time as not an ideal candidate and no current indication.  HTN --BP currently well controlled.  Continue current medications.  Continue to hold home HCTZ, given current renal function.  AOCKD --Per nephrology.  Replete electrolytes.  Caution with nephrotoxins, including metformin, diuretics.  No ACE/ARB/Arni.  Caution with contrast.  Renally dose medications as indicated.  DM2 --SSI.  Recommend glycemic control per IM/PCP.  Tremors --Uncertain date of onset. Per IM/neurology.   OSA not on CPAP --Recommend CPAP use going forward, discussed with patient.  She reports a diagnosis of sleep apnea and that she owns a CPAP machine; however, she removes it while she is sleeping.  Recommend CPAP during admission.       TIMI Risk Score for Unstable Angina or Non-ST Elevation MI:   The patient's TIMI risk score is 3, which indicates a 13% risk of all cause mortality, new or recurrent myocardial infarction or need for urgent revascularization in the next 14 days.     CHA2DS2-VASc Score = 4  This indicates a 4.8% annual risk of stroke. The patient's score is based upon: CHF History: No HTN History: Yes Diabetes History: Yes Stroke History: No Vascular Disease History: No Age Score: 1 Gender Score: 1         For questions or updates, please contact [facility] Please consult [email or web address] for contact info under    Signed, Lennon Alstrom, PA-C  12/28/2020 2:55 PM

## 2020-12-28 NOTE — Progress Notes (Signed)
   Cardiology consulted on this patient.   She is not currently in room 240. Unable to locate patient at this time.  Will check back later.  Signed, Lennon Alstrom, PA-C 12/28/2020, 12:24 PM

## 2020-12-28 NOTE — Progress Notes (Signed)
PHARMACY NOTE:  ANTIMICROBIAL RENAL DOSAGE ADJUSTMENT  Current antimicrobial regimen includes a mismatch between antimicrobial dosage and estimated renal function.  As per policy approved by the Pharmacy & Therapeutics and Medical Executive Committees, the antimicrobial dosage will be adjusted accordingly.  Current antimicrobial dosage:  Cefepime 1gm q12hrs  Indication: pyelonephritis   Renal Function:  Estimated Creatinine Clearance: 9.1 mL/min (A) (by C-G formula based on SCr of 6.81 mg/dL (H)). []      On intermittent HD, scheduled: []      On CRRT    Antimicrobial dosage has been changed to:  Cefepime 1gm q24hrs   Additional comments: This is the regimen approved for CrCl<10 not on HD  Thank you for allowing pharmacy to be a part of this patient's care.  , [facility] 12/28/2020 6:31 PM

## 2020-12-28 NOTE — Progress Notes (Addendum)
PROGRESS NOTE    Cynthia Lucas  WUJ:[id number] DOB: 1951-08-11 DOA: 12/27/2020 PCP: Toy CookeyHeadrick, Emily, FNP   Chief complaint.  Nausea vomiting, diarrhea and coffee-ground emesis. Brief Narrative:   Cynthia Lucas is a 70 y.o. African-American female female with medical history significant for hypertension, GERD, type 2 diabetes mellitus and sleep apnea as well as head tremors, who presented to the emergency room with acute onset of recurrent nausea and vomiting with diarrhea.  She admits to coffee-ground emesis and denied any bright red bleeding per rectum or melena.  In the emergency room, she was found to have atrial fibrillation with rapid ventricular response, she also had acute renal failure with creatinine 6.56.  She is placed on IV fluids, consult from cardiology and GI was obtained from the emergency room.   Assessment & Plan:   Active Problems:   GI bleeding  #1.  Acute renal failure on chronic kidney disease stage IIIa. Non anion gap or metabolic acidosis. Hypokalemia. Reviewed the previous labs, also reviewed outside labs, there is no lab results after 2015.  Not sure patient has any chronic kidney disease.  Currently, patient has a severe acute renal failure, etiology still unclear.  She may have GI bleed.  We will also obtain renal ultrasound to rule out any obstructions. Continue IV fluids, but change to bicarb drip. Also supplement potassium.  #2.  Coffee-ground emesis. Patient does not seem to have any abdominal pain, no tenderness in the upper stomach.  Hb seem to be stable. Pending GI consult. She does not have any nausea vomiting today.  Recheck a CBC tomorrow. Stool C. difficile toxin was negative.  3.  Atrial fibrillation with rapid ventricular response. Patient still Cardizem drip.  Pending echocardiogram. Pending cardiology consult. Patient cannot receive anticoagulation due to active bleeding.  4.  Uncontrolled type 2 diabetes with hyperglycemia. Start  insulin, discontinue Metformin.  #5 acute metabolic encephalopathy. Patient had a significant confusion since yesterday, I spoke with patient son and I confirmed that.  She still has some confusion and tremor.  We will follow closely.   1826. Talked with urology, stent placed with pus drained from kidney. Patient has pyelonephritis. Check lactic acid, blood culture, urine culture sent out.  Also supplemented potassium again. Give IV iron and B12 injection.    DVT prophylaxis: SCDs Code Status: Full Family Communication: Son updated. Disposition Plan:  .   Status is: Inpatient  Remains inpatient appropriate because:Inpatient level of care appropriate due to severity of illness   Dispo: The patient is from: Home              Anticipated d/c is to: Home              Patient currently is not medically stable to d/c.   Difficult to place patient No        I/O last 3 completed shifts: In: 2180.7 [I.V.:80.6; IV Piggyback:2100.1] Out: -  No intake/output data recorded.     Consultants:   GI, Nephrology, cardiology  Procedures: None  Antimicrobials: None  Subjective: Patient seem to have some confusion and a tremor today.  She does not feel well, but could not elaborate. She does not have any short of breath or cough. She has no abdominal pain or nausea vomiting. No fever or chills. No headache or dizziness. No chest pain.  Objective: Vitals:   12/28/20 0403 12/28/20 0755 12/28/20 0824 12/28/20 0900  BP: 105/68 119/70 116/79   Pulse: 92 (!) 103 (!) 105  Resp: 20 20 20    Temp: 98.6 F (37 C) 98.5 F (36.9 C) 99.2 F (37.3 C)   TempSrc: Oral Oral Oral   SpO2: 96% 95% 97%   Weight:    91.6 kg  Height:        Intake/Output Summary (Last 24 hours) at 12/28/2020 1019 Last data filed at 12/28/2020 0300 Gross per 24 hour  Intake 2180.68 ml  Output --  Net 2180.68 ml   Filed Weights   12/27/20 1841 12/27/20 2336 12/28/20 0900  Weight: 88.9 kg 90.7 kg 91.6  kg    Examination:  General exam: Appears calm and comfortable  Respiratory system: Clear to auscultation. Respiratory effort normal. Cardiovascular system: Irregular, tachycardic.  No JVD, murmurs, rubs, gallops or clicks. No pedal edema. Gastrointestinal system: Abdomen is nondistended, soft and nontender. No organomegaly or masses felt. Normal bowel sounds heard. Central nervous system: Alert and oriented x2. No focal neurological deficits. Extremities: Symmetric  Skin: No rashes, lesions or ulcers Psychiatry: Judgement and insight appear normal. Mood & affect appropriate.     Data Reviewed: I have personally reviewed following labs and imaging studies  CBC: Recent Labs  Lab 12/27/20 1841 12/28/20 0502  WBC 12.3* 11.7*  NEUTROABS 10.9*  --   HGB 13.7 11.5*  HCT 41.5 34.3*  MCV 79.2* 78.1*  PLT 237 216   Basic Metabolic Panel: Recent Labs  Lab 12/27/20 1841 12/27/20 2039 12/28/20 0502  NA 137  --  136  K 2.9*  --  3.0*  CL 100  --  105  CO2 21*  --  16*  GLUCOSE 441*  --  295*  BUN 73*  --  80*  CREATININE 6.56*  --  6.81*  CALCIUM 8.3*  --  7.9*  MG 2.2 2.1  --    GFR: Estimated Creatinine Clearance: 9.1 mL/min (A) (by C-G formula based on SCr of 6.81 mg/dL (H)). Liver Function Tests: Recent Labs  Lab 12/27/20 1841  AST 38  ALT 21  ALKPHOS 58  BILITOT 0.7  PROT 7.9  ALBUMIN 3.3*   No results for input(s): LIPASE, AMYLASE in the last 168 hours. No results for input(s): AMMONIA in the last 168 hours. Coagulation Profile: Recent Labs  Lab 12/27/20 1841  INR 1.2   Cardiac Enzymes: No results for input(s): CKTOTAL, CKMB, CKMBINDEX, TROPONINI in the last 168 hours. BNP (last 3 results) No results for input(s): PROBNP in the last 8760 hours. HbA1C: No results for input(s): HGBA1C in the last 72 hours. CBG: No results for input(s): GLUCAP in the last 168 hours. Lipid Profile: No results for input(s): CHOL, HDL, LDLCALC, TRIG, CHOLHDL, LDLDIRECT  in the last 72 hours. Thyroid Function Tests: Recent Labs    12/27/20 1841  TSH 1.858  FREET4 0.92   Anemia Panel: No results for input(s): VITAMINB12, FOLATE, FERRITIN, TIBC, IRON, RETICCTPCT in the last 72 hours. Sepsis Labs: No results for input(s): PROCALCITON, LATICACIDVEN in the last 168 hours.  Recent Results (from the past 240 hour(s))  Resp Panel by RT-PCR (Flu A&B, Covid) Nasopharyngeal Swab     Status: None   Collection Time: 12/27/20  6:48 PM   Specimen: Nasopharyngeal Swab; Nasopharyngeal(NP) swabs in vial transport medium  Result Value Ref Range Status   SARS Coronavirus 2 by RT PCR NEGATIVE NEGATIVE Final    Comment: (NOTE) SARS-CoV-2 target nucleic acids are NOT DETECTED.  The SARS-CoV-2 RNA is generally detectable in upper respiratory specimens during the acute phase of infection. The lowest concentration  of SARS-CoV-2 viral copies this assay can detect is 138 copies/mL. A negative result does not preclude SARS-Cov-2 infection and should not be used as the sole basis for treatment or other patient management decisions. A negative result may occur with  improper specimen collection/handling, submission of specimen other than nasopharyngeal swab, presence of viral mutation(s) within the areas targeted by this assay, and inadequate number of viral copies(<138 copies/mL). A negative result must be combined with clinical observations, patient history, and epidemiological information. The expected result is Negative.  Fact Sheet for Patients:  BloggerCourse.com  Fact Sheet for Healthcare Providers:  SeriousBroker.it  This test is no t yet approved or cleared by the [place] FDA and  has been authorized for detection and/or diagnosis of SARS-CoV-2 by FDA under an Emergency Use Authorization (EUA). This EUA will remain  in effect (meaning this test can be used) for the duration of the COVID-19 declaration  under Section 564(b)(1) of the Act, 21 U.S.C.section 360bbb-3(b)(1), unless the authorization is terminated  or revoked sooner.       Influenza A by PCR NEGATIVE NEGATIVE Final   Influenza B by PCR NEGATIVE NEGATIVE Final    Comment: (NOTE) The Xpert Xpress SARS-CoV-2/FLU/RSV plus assay is intended as an aid in the diagnosis of influenza from Nasopharyngeal swab specimens and should not be used as a sole basis for treatment. Nasal washings and aspirates are unacceptable for Xpert Xpress SARS-CoV-2/FLU/RSV testing.  Fact Sheet for Patients: BloggerCourse.com  Fact Sheet for Healthcare Providers: SeriousBroker.it  This test is not yet approved or cleared by the [place] FDA and has been authorized for detection and/or diagnosis of SARS-CoV-2 by FDA under an Emergency Use Authorization (EUA). This EUA will remain in effect (meaning this test can be used) for the duration of the COVID-19 declaration under Section 564(b)(1) of the Act, 21 U.S.C. section 360bbb-3(b)(1), unless the authorization is terminated or revoked.  Performed at [facility], [address] [place], [place] [address]   Gastrointestinal Panel by PCR , Stool     Status: None   Collection Time: 12/27/20  8:39 PM   Specimen: Stool  Result Value Ref Range Status   Campylobacter species NOT DETECTED NOT DETECTED Final   Plesimonas shigelloides NOT DETECTED NOT DETECTED Final   Salmonella species NOT DETECTED NOT DETECTED Final   Yersinia enterocolitica NOT DETECTED NOT DETECTED Final   Vibrio species NOT DETECTED NOT DETECTED Final   Vibrio cholerae NOT DETECTED NOT DETECTED Final   Enteroaggregative E coli (EAEC) NOT DETECTED NOT DETECTED Final   Enteropathogenic E coli (EPEC) NOT DETECTED NOT DETECTED Final   Enterotoxigenic E coli (ETEC) NOT DETECTED NOT DETECTED Final   Shiga like toxin producing E coli (STEC) NOT DETECTED NOT DETECTED Final    Shigella/Enteroinvasive E coli (EIEC) NOT DETECTED NOT DETECTED Final   Cryptosporidium NOT DETECTED NOT DETECTED Final   Cyclospora cayetanensis NOT DETECTED NOT DETECTED Final   Entamoeba histolytica NOT DETECTED NOT DETECTED Final   Giardia lamblia NOT DETECTED NOT DETECTED Final   Adenovirus F40/41 NOT DETECTED NOT DETECTED Final   Astrovirus NOT DETECTED NOT DETECTED Final   Norovirus GI/GII NOT DETECTED NOT DETECTED Final   Rotavirus A NOT DETECTED NOT DETECTED Final   Sapovirus (I, II, IV, and V) NOT DETECTED NOT DETECTED Final    Comment: Performed at [facility], [address] [place], [place] [address]  C Difficile Quick Screen w PCR reflex     Status: None  Collection Time: 12/27/20  8:39 PM   Specimen: Stool  Result Value Ref Range Status   C Diff antigen NEGATIVE NEGATIVE Final   C Diff toxin NEGATIVE NEGATIVE Final   C Diff interpretation No C. difficile detected.  Final    Comment: Performed at [facility], [address] [place], [place] [address]         Radiology Studies: US RENAL  Result Date: 12/28/2020 CLINICAL DATA:  Acute kidney injury EXAM: RENAL / URINARY TRACT ULTRASOUND COMPLETE COMPARISON:  None. FINDINGS: Right Kidney: Renal measurements: 12.5 x 6.7 x 7.5 cm = volume: 328 mL. Moderate hydronephrosis. No renal mass. No visualized shadowing stone. Left Kidney: Renal measurements: 11.1 x 4.6 x 5.1 cm = volume: Is 138 mL. Echogenicity within normal limits. No mass or hydronephrosis visualized. Bladder: Appears normal for degree of bladder distention. Patient unable to void at time of examination. Other: None. IMPRESSION: Moderate right hydronephrosis. Electronically Signed   By: Deatra Robinson M.D.   On: 12/28/2020 00:56   DG Chest Portable 1 View  Result Date: 12/27/2020 CLINICAL DATA:  Shortness of breath EXAM: PORTABLE CHEST 1 VIEW COMPARISON:  None. FINDINGS: Cardiac shadow is within normal limits. Elevation of the right  hemidiaphragm is noted. No focal infiltrate or effusion is seen. No acute bony abnormality is noted. IMPRESSION: No acute abnormality seen. Electronically Signed   By: Alcide Clever M.D.   On: 12/27/2020 19:08        Scheduled Meds: . atorvastatin  40 mg Oral QHS  . brimonidine  1 drop Both Eyes BID   And  . timolol  1 drop Both Eyes BID  . latanoprost  1 drop Left Eye QHS  . [START ON 12/31/2020] pantoprazole  40 mg Intravenous Q12H  . potassium chloride  40 mEq Oral Once   Continuous Infusions: . diltiazem (CARDIZEM) infusion 5 mg/hr (12/27/20 2153)  . pantoprozole (PROTONIX) infusion 8 mg/hr (12/28/20 0748)  . potassium chloride    . sodium bicarbonate (isotonic) 150 mEq in D5W 1000 mL infusion       LOS: 1 day    Time spent: 38 minutes    Marrion Coy, MD [facility]   To contact the attending provider between 7A-7P or the covering provider during after hours 7P-7A, please log into the web site [email or web address] and access using universal [place] password for that web site. If you do not have the password, please call the hospital operator.  12/28/2020, 10:19 AM

## 2020-12-29 ENCOUNTER — Encounter: Payer: Self-pay | Admitting: Urology

## 2020-12-29 ENCOUNTER — Inpatient Hospital Stay (HOSPITAL_COMMUNITY)
Admit: 2020-12-29 | Discharge: 2020-12-29 | Disposition: A | Discharge status: Home / Self Care | Payer: Medicare Other | Attending: Urology | Admitting: Urology

## 2020-12-29 DIAGNOSIS — I4891 Unspecified atrial fibrillation: Secondary | ICD-10-CM

## 2020-12-29 DIAGNOSIS — N130 Hydronephrosis with ureteropelvic junction obstruction: Secondary | ICD-10-CM

## 2020-12-29 DIAGNOSIS — A419 Sepsis, unspecified organism: Secondary | ICD-10-CM

## 2020-12-29 DIAGNOSIS — R778 Other specified abnormalities of plasma proteins: Secondary | ICD-10-CM | POA: Diagnosis not present

## 2020-12-29 DIAGNOSIS — N1330 Unspecified hydronephrosis: Secondary | ICD-10-CM

## 2020-12-29 DIAGNOSIS — R6520 Severe sepsis without septic shock: Secondary | ICD-10-CM

## 2020-12-29 DIAGNOSIS — E538 Deficiency of other specified B group vitamins: Secondary | ICD-10-CM | POA: Diagnosis not present

## 2020-12-29 DIAGNOSIS — N10 Acute tubulo-interstitial nephritis: Secondary | ICD-10-CM | POA: Diagnosis not present

## 2020-12-29 DIAGNOSIS — K922 Gastrointestinal hemorrhage, unspecified: Secondary | ICD-10-CM | POA: Diagnosis not present

## 2020-12-29 DIAGNOSIS — N170 Acute kidney failure with tubular necrosis: Secondary | ICD-10-CM | POA: Diagnosis not present

## 2020-12-29 DIAGNOSIS — N200 Calculus of kidney: Secondary | ICD-10-CM

## 2020-12-29 LAB — CBC WITH DIFFERENTIAL/PLATELET
Abs Immature Granulocytes: 0.09 10*3/uL — ABNORMAL HIGH (ref 0.00–0.07)
Basophils Absolute: 0 10*3/uL (ref 0.0–0.1)
Basophils Relative: 0 %
Eosinophils Absolute: 0 10*3/uL (ref 0.0–0.5)
Eosinophils Relative: 0 %
HCT: 32 % — ABNORMAL LOW (ref 36.0–46.0)
Hemoglobin: 10.8 g/dL — ABNORMAL LOW (ref 12.0–15.0)
Immature Granulocytes: 1 %
Lymphocytes Relative: 5 %
Lymphs Abs: 0.5 10*3/uL — ABNORMAL LOW (ref 0.7–4.0)
MCH: 26.5 pg (ref 26.0–34.0)
MCHC: 33.8 g/dL (ref 30.0–36.0)
MCV: 78.4 fL — ABNORMAL LOW (ref 80.0–100.0)
Monocytes Absolute: 0.5 10*3/uL (ref 0.1–1.0)
Monocytes Relative: 5 %
Neutro Abs: 8.7 10*3/uL — ABNORMAL HIGH (ref 1.7–7.7)
Neutrophils Relative %: 89 %
Platelets: 196 10*3/uL (ref 150–400)
RBC: 4.08 MIL/uL (ref 3.87–5.11)
RDW: 16.3 % — ABNORMAL HIGH (ref 11.5–15.5)
Smear Review: NORMAL
WBC: 9.8 10*3/uL (ref 4.0–10.5)
nRBC: 0 % (ref 0.0–0.2)

## 2020-12-29 LAB — GLUCOSE, CAPILLARY
Glucose-Capillary: 488 mg/dL — ABNORMAL HIGH (ref 70–99)
Glucose-Capillary: 460 mg/dL — ABNORMAL HIGH (ref 70–99)
Glucose-Capillary: 392 mg/dL — ABNORMAL HIGH (ref 70–99)
Glucose-Capillary: 372 mg/dL — ABNORMAL HIGH (ref 70–99)
Glucose-Capillary: 444 mg/dL — ABNORMAL HIGH (ref 70–99)
Glucose-Capillary: 170 mg/dL — ABNORMAL HIGH (ref 70–99)

## 2020-12-29 LAB — BLOOD GAS, VENOUS
Acid-base deficit: 3.7 mmol/L — ABNORMAL HIGH (ref 0.0–2.0)
Bicarbonate: 22.1 mmol/L (ref 20.0–28.0)
O2 Saturation: 94.2 %
Patient temperature: 37
pCO2, Ven: 42 mmHg — ABNORMAL LOW (ref 44.0–60.0)
pH, Ven: 7.33 (ref 7.250–7.430)
pO2, Ven: 77 mmHg — ABNORMAL HIGH (ref 32.0–45.0)

## 2020-12-29 LAB — ECHOCARDIOGRAM COMPLETE
Height: 67 in
S' Lateral: 2.54 cm
Weight: 3398.4 oz

## 2020-12-29 LAB — COMPREHENSIVE METABOLIC PANEL
ALT: 20 U/L (ref 0–44)
AST: 25 U/L (ref 15–41)
Albumin: 2.4 g/dL — ABNORMAL LOW (ref 3.5–5.0)
Alkaline Phosphatase: 55 U/L (ref 38–126)
Anion gap: 12 (ref 5–15)
BUN: 87 mg/dL — ABNORMAL HIGH (ref 8–23)
CO2: 21 mmol/L — ABNORMAL LOW (ref 22–32)
Calcium: 7 mg/dL — ABNORMAL LOW (ref 8.9–10.3)
Chloride: 98 mmol/L (ref 98–111)
Creatinine, Ser: 7.31 mg/dL — ABNORMAL HIGH (ref 0.44–1.00)
GFR, Estimated: 6 mL/min — ABNORMAL LOW (ref >60)
Glucose, Bld: 543 mg/dL (ref 70–99)
Potassium: 3.3 mmol/L — ABNORMAL LOW (ref 3.5–5.1)
Sodium: 131 mmol/L — ABNORMAL LOW (ref 135–145)
Total Bilirubin: 0.5 mg/dL (ref 0.3–1.2)
Total Protein: 6 g/dL — ABNORMAL LOW (ref 6.5–8.1)

## 2020-12-29 LAB — PHOSPHORUS
Phosphorus: 4.5 mg/dL (ref 2.5–4.6)
Phosphorus: 4.5 mg/dL (ref 2.5–4.6)

## 2020-12-29 LAB — KAPPA/LAMBDA LIGHT CHAINS
Kappa free light chain: 67.2 mg/L — ABNORMAL HIGH (ref 3.3–19.4)
Kappa, lambda light chain ratio: 1.57 (ref 0.26–1.65)
Lambda free light chains: 42.8 mg/L — ABNORMAL HIGH (ref 5.7–26.3)

## 2020-12-29 LAB — IRON AND TIBC
Iron: 20 ug/dL — ABNORMAL LOW (ref 28–170)
Saturation Ratios: 11 % (ref 10.4–31.8)
TIBC: 181 ug/dL — ABNORMAL LOW (ref 250–450)
UIBC: 161 ug/dL

## 2020-12-29 LAB — BETA-HYDROXYBUTYRIC ACID: Beta-Hydroxybutyric Acid: 0.07 mmol/L (ref 0.05–0.27)

## 2020-12-29 MED ORDER — INSULIN ASPART 100 UNIT/ML ~~LOC~~ SOLN: 8.0000 [IU] | Freq: Three times a day (TID) | SUBCUTANEOUS | Status: DC

## 2020-12-29 MED ORDER — SODIUM CHLORIDE 0.9 % IV SOLN
300.0000 mg | Freq: Once | INTRAVENOUS | Status: AC
  Administered 2020-12-29: 300 mg via INTRAVENOUS
  Filled 2020-12-29: qty 15

## 2020-12-29 MED ORDER — METOPROLOL TARTRATE 25 MG PO TABS
12.5000 mg | ORAL_TABLET | Freq: Two times a day (BID) | ORAL | Status: DC
  Administered 2020-12-29 – 2020-12-30 (×3): 12.5 mg via ORAL
  Filled 2020-12-29 (×2): qty 1

## 2020-12-29 MED ORDER — STERILE WATER FOR INJECTION IV SOLN
INTRAVENOUS | Status: DC
  Filled 2020-12-29 (×3): qty 850
  Filled 2020-12-29: qty 150
  Filled 2020-12-29 (×2): qty 850

## 2020-12-29 MED ORDER — INSULIN GLARGINE 100 UNIT/ML ~~LOC~~ SOLN
20.0000 [IU] | Freq: Every day | SUBCUTANEOUS | Status: DC
  Administered 2020-12-29 – 2021-01-03 (×6): 20 [IU] via SUBCUTANEOUS
  Filled 2020-12-29 (×7): qty 0.2

## 2020-12-29 MED ORDER — INSULIN ASPART 100 UNIT/ML ~~LOC~~ SOLN
20.0000 [IU] | Freq: Once | SUBCUTANEOUS | Status: AC
  Administered 2020-12-29: 20 [IU] via SUBCUTANEOUS
  Filled 2020-12-29: qty 1

## 2020-12-29 MED ORDER — CYANOCOBALAMIN 1000 MCG/ML IJ SOLN
1000.0000 ug | Freq: Once | INTRAMUSCULAR | Status: AC
  Administered 2020-12-29: 1000 ug via INTRAMUSCULAR
  Filled 2020-12-29: qty 1

## 2020-12-29 MED ORDER — SODIUM CHLORIDE 0.9 % IV BOLUS
1000.0000 mL | Freq: Once | INTRAVENOUS | Status: AC
  Administered 2020-12-29: 1000 mL via INTRAVENOUS

## 2020-12-29 MED ORDER — INSULIN ASPART 100 UNIT/ML ~~LOC~~ SOLN
0.0000 [IU] | SUBCUTANEOUS | Status: DC
  Administered 2020-12-29 (×2): 20 [IU] via SUBCUTANEOUS
  Administered 2020-12-29: 4 [IU] via SUBCUTANEOUS
  Administered 2020-12-29: 20 [IU] via SUBCUTANEOUS
  Administered 2020-12-30: 4 [IU] via SUBCUTANEOUS
  Administered 2020-12-30: 7 [IU] via SUBCUTANEOUS
  Administered 2020-12-30: 4 [IU] via SUBCUTANEOUS
  Administered 2020-12-30 (×3): 3 [IU] via SUBCUTANEOUS
  Administered 2020-12-31 (×2): 11 [IU] via SUBCUTANEOUS
  Administered 2020-12-31: 4 [IU] via SUBCUTANEOUS
  Filled 2020-12-29 (×13): qty 1

## 2020-12-29 MED ORDER — POTASSIUM CHLORIDE 10 MEQ/100ML IV SOLN
10.0000 meq | INTRAVENOUS | Status: AC
  Administered 2020-12-29: 10 meq via INTRAVENOUS
  Filled 2020-12-29 (×2): qty 100

## 2020-12-29 MED ORDER — CHLORHEXIDINE GLUCONATE CLOTH 2 % EX PADS
6.0000 | MEDICATED_PAD | Freq: Every day | CUTANEOUS | Status: DC
  Administered 2020-12-29 – 2021-01-02 (×5): 6 via TOPICAL

## 2020-12-29 MED ORDER — POTASSIUM CHLORIDE 10 MEQ/100ML IV SOLN
10.0000 meq | Freq: Once | INTRAVENOUS | Status: AC
  Administered 2020-12-29: 10 meq via INTRAVENOUS
  Filled 2020-12-29: qty 100

## 2020-12-29 MED ORDER — POTASSIUM CHLORIDE CRYS ER 20 MEQ PO TBCR
40.0000 meq | EXTENDED_RELEASE_TABLET | Freq: Once | ORAL | Status: AC
  Administered 2020-12-29: 40 meq via ORAL
  Filled 2020-12-29: qty 2

## 2020-12-29 NOTE — Progress Notes (Signed)
Wyline MoodKiran Ugochi Henzler , MD [address], [address], [place], [place], [id number] [address], [place], [place], [id number] Phone: [phone]  Fax: [phone]   Shary KeySharon Fitzgibbon is being followed for upper GI bleed day 1 of follow up   Subjective: Feels much better, no further bleeding   Objective: Vital signs in last 24 hours: Vitals:   12/29/20 0400 12/29/20 0530 12/29/20 0629 12/29/20 0754  BP: 95/65 111/67 110/75   Pulse: 79     Resp: 19     Temp: 97.8 F (36.6 C)     TempSrc: Axillary     SpO2: 100%     Weight: 94.7 kg   96.3 kg  Height:       Weight change: 2.695 kg  Intake/Output Summary (Last 24 hours) at 12/29/2020 1011 Last data filed at 12/29/2020 0626 Gross per 24 hour  Intake 3704.83 ml  Output 200 ml  Net 3504.83 ml     Exam: Neuro : Ax o x 3 not in any distress, moving all 4 limbs no gross neuro deficit .    Lab Results: @LABTEST2 @ Micro Results: Recent Results (from the past 240 hour(s))  Resp Panel by RT-PCR (Flu A&B, Covid) Nasopharyngeal Swab     Status: None   Collection Time: 12/27/20  6:48 PM   Specimen: Nasopharyngeal Swab; Nasopharyngeal(NP) swabs in vial transport medium  Result Value Ref Range Status   SARS Coronavirus 2 by RT PCR NEGATIVE NEGATIVE Final    Comment: (NOTE) SARS-CoV-2 target nucleic acids are NOT DETECTED.  The SARS-CoV-2 RNA is generally detectable in upper respiratory specimens during the acute phase of infection. The lowest concentration of SARS-CoV-2 viral copies this assay can detect is 138 copies/mL. A negative result does not preclude SARS-Cov-2 infection and should not be used as the sole basis for treatment or other patient management decisions. A negative result may occur with  improper specimen collection/handling, submission of specimen other than nasopharyngeal swab, presence of viral mutation(s) within the areas targeted by this assay, and inadequate number of viral copies(<138 copies/mL). A negative  result must be combined with clinical observations, patient history, and epidemiological information. The expected result is Negative.  Fact Sheet for Patients:  BloggerCourse.comhttps://www.fda.gov/media/152166/download  Fact Sheet for Healthcare Providers:  SeriousBroker.ithttps://www.fda.gov/media/152162/download  This test is no t yet approved or cleared by the [place] FDA and  has been authorized for detection and/or diagnosis of SARS-CoV-2 by FDA under an Emergency Use Authorization (EUA). This EUA will remain  in effect (meaning this test can be used) for the duration of the COVID-19 declaration under Section 564(b)(1) of the Act, 21 U.S.C.section 360bbb-3(b)(1), unless the authorization is terminated  or revoked sooner.       Influenza A by PCR NEGATIVE NEGATIVE Final   Influenza B by PCR NEGATIVE NEGATIVE Final    Comment: (NOTE) The Xpert Xpress SARS-CoV-2/FLU/RSV plus assay is intended as an aid in the diagnosis of influenza from Nasopharyngeal swab specimens and should not be used as a sole basis for treatment. Nasal washings and aspirates are unacceptable for Xpert Xpress SARS-CoV-2/FLU/RSV testing.  Fact Sheet for Patients: BloggerCourse.comhttps://www.fda.gov/media/152166/download  Fact Sheet for Healthcare Providers: SeriousBroker.ithttps://www.fda.gov/media/152162/download  This test is not yet approved or cleared by the [place] FDA and has been authorized for detection and/or diagnosis of SARS-CoV-2 by FDA under an Emergency Use Authorization (EUA). This EUA will remain in effect (meaning this test can be used) for the duration of the COVID-19 declaration under Section 564(b)(1) of the  Act, 21 U.S.C. section 360bbb-3(b)(1), unless the authorization is terminated or revoked.  Performed at [facility], [address] [place], [place] [address]   Gastrointestinal Panel by PCR , Stool     Status: None   Collection Time: 12/27/20  8:39 PM   Specimen: Stool  Result Value Ref Range Status    Campylobacter species NOT DETECTED NOT DETECTED Final   Plesimonas shigelloides NOT DETECTED NOT DETECTED Final   Salmonella species NOT DETECTED NOT DETECTED Final   Yersinia enterocolitica NOT DETECTED NOT DETECTED Final   Vibrio species NOT DETECTED NOT DETECTED Final   Vibrio cholerae NOT DETECTED NOT DETECTED Final   Enteroaggregative E coli (EAEC) NOT DETECTED NOT DETECTED Final   Enteropathogenic E coli (EPEC) NOT DETECTED NOT DETECTED Final   Enterotoxigenic E coli (ETEC) NOT DETECTED NOT DETECTED Final   Shiga like toxin producing E coli (STEC) NOT DETECTED NOT DETECTED Final   Shigella/Enteroinvasive E coli (EIEC) NOT DETECTED NOT DETECTED Final   Cryptosporidium NOT DETECTED NOT DETECTED Final   Cyclospora cayetanensis NOT DETECTED NOT DETECTED Final   Entamoeba histolytica NOT DETECTED NOT DETECTED Final   Giardia lamblia NOT DETECTED NOT DETECTED Final   Adenovirus F40/41 NOT DETECTED NOT DETECTED Final   Astrovirus NOT DETECTED NOT DETECTED Final   Norovirus GI/GII NOT DETECTED NOT DETECTED Final   Rotavirus A NOT DETECTED NOT DETECTED Final   Sapovirus (I, II, IV, and V) NOT DETECTED NOT DETECTED Final    Comment: Performed at [facility], [address] [place], [place] [address]  C Difficile Quick Screen w PCR reflex     Status: None   Collection Time: 12/27/20  8:39 PM   Specimen: Stool  Result Value Ref Range Status   C Diff antigen NEGATIVE NEGATIVE Final   C Diff toxin NEGATIVE NEGATIVE Final   C Diff interpretation No C. difficile detected.  Final    Comment: Performed at [facility], [address] [place], [place] [address]  CULTURE, BLOOD (ROUTINE X 2) w Reflex to ID Panel     Status: None (Preliminary result)   Collection Time: 12/28/20  7:10 PM   Specimen: BLOOD  Result Value Ref Range Status   Specimen Description BLOOD BLOOD RIGHT HAND  Final   Special Requests   Final    BOTTLES DRAWN AEROBIC AND ANAEROBIC Blood Culture adequate  volume   Culture   Final    NO GROWTH < 12 HOURS Performed at [facility], [address], [place], [place] [address]    Report Status PENDING  Incomplete  CULTURE, BLOOD (ROUTINE X 2) w Reflex to ID Panel     Status: None (Preliminary result)   Collection Time: 12/28/20  7:46 PM   Specimen: BLOOD  Result Value Ref Range Status   Specimen Description BLOOD BLOOD LEFT HAND  Final   Special Requests   Final    BOTTLES DRAWN AEROBIC AND ANAEROBIC Blood Culture adequate volume   Culture   Final    NO GROWTH < 12 HOURS Performed at [facility], [address] [place], [place] [address]    Report Status PENDING  Incomplete   Studies/Results: CT ABDOMEN PELVIS WO CONTRAST  Result Date: 12/28/2020 CLINICAL DATA:  Flank pain, kidney stones suspected, right hydronephrosis by ultrasound EXAM: CT ABDOMEN AND PELVIS WITHOUT CONTRAST TECHNIQUE: Multidetector CT imaging of the abdomen and pelvis was performed following the standard protocol without IV contrast. COMPARISON:  Same day renal ultrasound,  12/28/2020 FINDINGS: Lower chest: Atelectasis and/or consolidation of the dependent right lung base. Hepatobiliary: No solid liver abnormality is seen. Hepatic steatosis. No gallstones, gallbladder wall thickening, or biliary dilatation. Pancreas: Unremarkable. No pancreatic ductal dilatation or surrounding inflammatory changes. Spleen: Normal in size without significant abnormality. Adrenals/Urinary Tract: Adrenal glands are unremarkable. Moderate right hydronephrosis with an oblong calculus at the right ureteropelvic junction measuring 2.0 cm in length. Right-sided perinephric fat stranding. No additional calculi identified. Stomach/Bowel: Stomach is within normal limits. Appendix appears normal. No evidence of bowel wall thickening, distention, or inflammatory changes. Vascular/Lymphatic: Aortic atherosclerosis. No enlarged abdominal or pelvic lymph nodes. Reproductive: Status post  hysterectomy. Other: No abdominal wall hernia or abnormality. No abdominopelvic ascites. Musculoskeletal: No acute or significant osseous findings. IMPRESSION: 1. Moderate right hydronephrosis with an oblong calculus at the right ureteropelvic junction measuring 2.0 cm in length. Right-sided perinephric fat stranding. No additional calculi identified. 2. Hepatic steatosis. Aortic Atherosclerosis (ICD10-I70.0). Electronically Signed   By: Lauralyn Primes M.D.   On: 12/28/2020 12:49   US RENAL  Result Date: 12/28/2020 CLINICAL DATA:  Acute kidney injury EXAM: RENAL / URINARY TRACT ULTRASOUND COMPLETE COMPARISON:  None. FINDINGS: Right Kidney: Renal measurements: 12.5 x 6.7 x 7.5 cm = volume: 328 mL. Moderate hydronephrosis. No renal mass. No visualized shadowing stone. Left Kidney: Renal measurements: 11.1 x 4.6 x 5.1 cm = volume: Is 138 mL. Echogenicity within normal limits. No mass or hydronephrosis visualized. Bladder: Appears normal for degree of bladder distention. Patient unable to void at time of examination. Other: None. IMPRESSION: Moderate right hydronephrosis. Electronically Signed   By: Deatra Robinson M.D.   On: 12/28/2020 00:56   DG Chest Portable 1 View  Result Date: 12/27/2020 CLINICAL DATA:  Shortness of breath EXAM: PORTABLE CHEST 1 VIEW COMPARISON:  None. FINDINGS: Cardiac shadow is within normal limits. Elevation of the right hemidiaphragm is noted. No focal infiltrate or effusion is seen. No acute bony abnormality is noted. IMPRESSION: No acute abnormality seen. Electronically Signed   By: Alcide Clever M.D.   On: 12/27/2020 19:08   DG OR UROLOGY CYSTO IMAGE ([facility] ONLY)  Result Date: 12/28/2020 There is no interpretation for this exam.  This order is for images obtained during a surgical procedure.  Please See "Surgeries" Tab for more information regarding the procedure.   Medications: I have reviewed the patient's current medications. Scheduled Meds: . atorvastatin  40 mg Oral QHS  .  brimonidine  1 drop Both Eyes BID   And  . timolol  1 drop Both Eyes BID  . Chlorhexidine Gluconate Cloth  6 each Topical Daily  . feeding supplement  1 Container Oral TID BM  . insulin aspart  0-20 Units Subcutaneous Q4H  . insulin aspart  8 Units Subcutaneous TID WC  . insulin glargine  20 Units Subcutaneous Daily  . iron polysaccharides  150 mg Oral Daily  . latanoprost  1 drop Left Eye QHS  . [START ON 12/31/2020] pantoprazole  40 mg Intravenous Q12H   Continuous Infusions: . ceFEPime (MAXIPIME) IV Stopped (12/28/20 2052)  . diltiazem (CARDIZEM) infusion 5 mg/hr (12/29/20 0626)  . pantoprozole (PROTONIX) infusion 8 mg/hr (12/29/20 0626)  .  sodium bicarbonate (isotonic) infusion in sterile water 125 mL/hr at 12/29/20 0641   PRN Meds:.acetaminophen **OR** acetaminophen, ondansetron **OR** ondansetron (ZOFRAN) IV, traZODone   Assessment: Active Problems:   GI bleeding   Acute renal failure syndrome (HCC)   Metabolic acidosis   Hypokalemia   New onset atrial fibrillation (  HCC)   Uncontrolled type 2 diabetes mellitus with hyperglycemia, without long-term current use of insulin (HCC)   Cynthia Lucas is a 70 y.o. y/o female with is admitted with acute nausea vomiting and nonbloody diarrhea.  I have been consulted for a GI bleed.  Patient's admission has been complicated by AKI, metabolic acidosis, hyperglycemia and the patient was noted to be in atrial fibrillation with RVR.  Elevated troponin.  Hemoglobin is close to baseline at around 11 g.  Microcytosis has been noted.  Apart from hematemesis no other overt blood loss reported.  Plan 1.  Once patient has been cleared by cardiology can proceed with EGD.  I believe echo is on the process and clearance will be decided after that . Differentials for upper GI bleed include Mallory-Weiss tear versus gastritis which could be postinfectious.  2.    B12 levels are borderline low would suggest replacement.  Iron levels are also low  suggest replacement.  And as an outpatient may need further evaluation to evaluate for iron deficiency which could include a colonoscopy.  I do note that homocysteine levels are in process.    LOS: 2 days   Wyline Mood, MD 12/29/2020, 10:11 AM

## 2020-12-29 NOTE — Progress Notes (Signed)
   12/28/20 2002  Vitals  Temp (!) 97.4 F (36.3 C)  Temp Source Oral  BP (!) 101/58  MAP (mmHg) 68  BP Location Right Arm  BP Method Automatic  Patient Position (if appropriate) Lying  Pulse Rate (!) 105  Pulse Rate Source Monitor  Resp 20  Level of Consciousness  Level of Consciousness Responds to Voice  MEWS COLOR  MEWS Score Color Yellow  Oxygen Therapy  SpO2 95 %  O2 Device Nasal Cannula  O2 Flow Rate (L/min) 2 L/min  Pulse Oximetry Type Continuous  Pain Assessment  Pain Scale 0-10  Pain Score 0  MEWS Score  MEWS Temp 0  MEWS Systolic 0  MEWS Pulse 1  MEWS RR 0  MEWS LOC 1  MEWS Score 2   Patient stable at this time.

## 2020-12-29 NOTE — Progress Notes (Addendum)
PROGRESS NOTE    Cynthia Lucas  GYF:[id number] DOB: Jul 27, 1951 DOA: 12/27/2020 PCP: Gennette Pac, FNP   Follow-up pyelonephritis. Brief Narrative:  Cynthia Lucas a 70 y.o.African-American female femalewith medical history significant forhypertension, GERD, type 2 diabetes mellitus and sleep apnea as well as head tremors, who presented to the emergency room with acute onset of recurrent nausea and vomiting with diarrhea. She admits to coffee-ground emesis and denied any bright red bleeding per rectum or melena.  In the emergency room, she was found to have atrial fibrillation with rapid ventricular response, she also had acute renal failure with creatinine 6.56.  She is placed on IV fluids, consult from cardiology and GI was obtained from the emergency room. 3/28.  Patient ultrasound showed moderate hydronephrosis.  Ureteral stent was placed by urology, during the process, patient had pus drained from the kidney, consistent with acute pyelonephritis.  Cefepime was started.   Assessment & Plan:   Active Problems:   GI bleeding   Acute renal failure syndrome (HCC)   Metabolic acidosis   Hypokalemia   New onset atrial fibrillation (Cynthia Lucas)   Uncontrolled type 2 diabetes mellitus with hyperglycemia, without long-term current use of insulin (HCC)  #1. sepsis secondary to acute pyelonephritis. Acute pyelonephritis secondary to obstructing right sided kidney stone. Right hydronephrosis secondary to kidney stone. Right sided kidney stone. I reviewed the patient's admitting vital signs and the lab results, patient met sepsis criteria at time admission with severe tachycardia, tachypnea and leukocytosis.  Patient also had acute renal failure. Currently is hemodynamically stable. Patient had right sided ureteral stent placed by urology. We will continue antibiotics with cefepime pending urine culture results.  Blood culture was also sent out, pending results. Lactic acid level was  normal.  2.  Acute renal failure on chronic kidney disease stage IIIa. Non anion gap metabolic acidosis. Hypokalemia. Hyponatremia. Patient renal failure is secondary to obstruction and sepsis. Patient is followed by nephrology, renal function has not been improved yet. We will continue bicarb drip. Patient had refractory hypokalemia, continue supplement with IV.  #3.  Uncontrolled type 2 diabetes with hyperglycemia. Continue Lantus, scheduled insulin and sliding scale insulin.  Adjust dose as needed.  4.  Coffee-ground emesis. Iron deficient anemia.  Borderline B12 level. Patient hemoglobin still stable at 10.8, continue PPI.  Patient has been seen by GI, and work-up when patient medical condition is more stable. She will be giving IV iron, she was also given B12 injection, pending homocystine level. Condition has been stable.  5.  Acute metabolic encephalopathy. Secondary to sepsis, condition much improved today.  #6. paroxysmal atrial fibrillation with rapid ventricular response. Patient has converted to sinus rhythm. Not able to anticoagulate due to GI bleed. Discontinue Cardizem drip.   DVT prophylaxis: SCDs Code Status: Full Family Communication:  Disposition Plan:  .   Status is: Inpatient  Remains inpatient appropriate because:Inpatient level of care appropriate due to severity of illness   Dispo: The patient is from: Home              Anticipated d/c is to: Home              Patient currently is not medically stable to d/c.   Difficult to place patient No        I/O last 3 completed shifts: In: 5885.5 [P.O.:170; I.V.:2350.7; IV Piggyback:3364.9] Out: 200 [Urine:200] No intake/output data recorded.     Consultants:   Urology, nephrology  Procedures: Ureteral stent.  Antimicrobials:  Cefepime.  Subjective:  Patient feels much better today.  She is more awake, less confused. She denies any short of breath or cough. She does not have any fever  or chills. She does not have any abdominal pain or nausea vomiting. She has a Foley catheter, she does not have any dysuria. No heat headache or dizziness. No chest pain or palpitation.  Objective: Vitals:   12/29/20 0400 12/29/20 0530 12/29/20 0629 12/29/20 0754  BP: 95/65 111/67 110/75   Pulse: 79     Resp: 19     Temp: 97.8 F (36.6 C)     TempSrc: Axillary     SpO2: 100%     Weight: 94.7 kg   96.3 kg  Height:        Intake/Output Summary (Last 24 hours) at 12/29/2020 1040 Last data filed at 12/29/2020 0626 Gross per 24 hour  Intake 3704.83 ml  Output 200 ml  Net 3504.83 ml   Filed Weights   12/28/20 0900 12/29/20 0400 12/29/20 0754  Weight: 91.6 kg 94.7 kg 96.3 kg    Examination:  General exam: Appears calm and comfortable  Respiratory system: Clear to auscultation. Respiratory effort normal. Cardiovascular system: S1 & S2 heard, RRR. No JVD, murmurs, rubs, gallops or clicks. No pedal edema. Gastrointestinal system: Abdomen is nondistended, soft and nontender. No organomegaly or masses felt. Normal bowel sounds heard. Central nervous system: Alert and oriented x2. No focal neurological deficits. Extremities: Symmetric 5 x 5 power. Skin: No rashes, lesions or ulcers Psychiatry:  Mood & affect appropriate.     Data Reviewed: I have personally reviewed following labs and imaging studies  CBC: Recent Labs  Lab 12/27/20 1841 12/28/20 0502 12/29/20 0456  WBC 12.3* 11.7* 9.8  NEUTROABS 10.9*  --  8.7*  HGB 13.7 11.5* 10.8*  HCT 41.5 34.3* 32.0*  MCV 79.2* 78.1* 78.4*  PLT 237 216 101   Basic Metabolic Panel: Recent Labs  Lab 12/27/20 1841 12/27/20 2039 12/28/20 0502 12/28/20 1504 12/29/20 0456  NA 137  --  136  --  131*  K 2.9*  --  3.0* 3.1* 3.3*  CL 100  --  105  --  98  CO2 21*  --  16*  --  21*  GLUCOSE 441*  --  295*  --  543*  BUN 73*  --  80*  --  87*  CREATININE 6.56*  --  6.81*  --  7.31*  CALCIUM 8.3*  --  7.9*  --  7.0*  MG 2.2 2.1  --   1.9  --   PHOS  --   --   --   --  4.5  4.5   GFR: Estimated Creatinine Clearance: 8.7 mL/min (A) (by C-G formula based on SCr of 7.31 mg/dL (H)). Liver Function Tests: Recent Labs  Lab 12/27/20 1841 12/29/20 0456  AST 38 25  ALT 21 20  ALKPHOS 58 55  BILITOT 0.7 0.5  PROT 7.9 6.0*  ALBUMIN 3.3* 2.4*   No results for input(s): LIPASE, AMYLASE in the last 168 hours. No results for input(s): AMMONIA in the last 168 hours. Coagulation Profile: Recent Labs  Lab 12/27/20 1841  INR 1.2   Cardiac Enzymes: No results for input(s): CKTOTAL, CKMB, CKMBINDEX, TROPONINI in the last 168 hours. BNP (last 3 results) No results for input(s): PROBNP in the last 8760 hours. HbA1C: Recent Labs    12/28/20 1039  HGBA1C 7.9*   CBG: Recent Labs  Lab 12/28/20 1837 12/28/20 2039 12/29/20 0409 12/29/20 7510  12/29/20 0753  GLUCAP 282* 339* 488* 460* 392*   Lipid Profile: No results for input(s): CHOL, HDL, LDLCALC, TRIG, CHOLHDL, LDLDIRECT in the last 72 hours. Thyroid Function Tests: Recent Labs    12/27/20 1841  TSH 1.858  FREET4 0.92   Anemia Panel: Recent Labs    12/28/20 1045  VITAMINB12 239  FOLATE 8.6  TIBC 192*  IRON 13*   Sepsis Labs: Recent Labs  Lab 12/28/20 1824 12/28/20 1957  LATICACIDVEN 1.3 1.1    Recent Results (from the past 240 hour(s))  Resp Panel by RT-PCR (Flu A&B, Covid) Nasopharyngeal Swab     Status: None   Collection Time: 12/27/20  6:48 PM   Specimen: Nasopharyngeal Swab; Nasopharyngeal(NP) swabs in vial transport medium  Result Value Ref Range Status   SARS Coronavirus 2 by RT PCR NEGATIVE NEGATIVE Final    Comment: (NOTE) SARS-CoV-2 target nucleic acids are NOT DETECTED.  The SARS-CoV-2 RNA is generally detectable in upper respiratory specimens during the acute phase of infection. The lowest concentration of SARS-CoV-2 viral copies this assay can detect is 138 copies/mL. A negative result does not preclude SARS-Cov-2 infection  and should not be used as the sole basis for treatment or other patient management decisions. A negative result may occur with  improper specimen collection/handling, submission of specimen other than nasopharyngeal swab, presence of viral mutation(s) within the areas targeted by this assay, and inadequate number of viral copies(<138 copies/mL). A negative result must be combined with clinical observations, patient history, and epidemiological information. The expected result is Negative.  Fact Sheet for Patients:  BloggerCourse.com  Fact Sheet for Healthcare Providers:  SeriousBroker.it  This test is no t yet approved or cleared by the [place] FDA and  has been authorized for detection and/or diagnosis of SARS-CoV-2 by FDA under an Emergency Use Authorization (EUA). This EUA will remain  in effect (meaning this test can be used) for the duration of the COVID-19 declaration under Section 564(b)(1) of the Act, 21 U.S.C.section 360bbb-3(b)(1), unless the authorization is terminated  or revoked sooner.       Influenza A by PCR NEGATIVE NEGATIVE Final   Influenza B by PCR NEGATIVE NEGATIVE Final    Comment: (NOTE) The Xpert Xpress SARS-CoV-2/FLU/RSV plus assay is intended as an aid in the diagnosis of influenza from Nasopharyngeal swab specimens and should not be used as a sole basis for treatment. Nasal washings and aspirates are unacceptable for Xpert Xpress SARS-CoV-2/FLU/RSV testing.  Fact Sheet for Patients: BloggerCourse.com  Fact Sheet for Healthcare Providers: SeriousBroker.it  This test is not yet approved or cleared by the [place] FDA and has been authorized for detection and/or diagnosis of SARS-CoV-2 by FDA under an Emergency Use Authorization (EUA). This EUA will remain in effect (meaning this test can be used) for the duration of the COVID-19 declaration  under Section 564(b)(1) of the Act, 21 U.S.C. section 360bbb-3(b)(1), unless the authorization is terminated or revoked.  Performed at [facility], [address] [place], [place] [address]   Gastrointestinal Panel by PCR , Stool     Status: None   Collection Time: 12/27/20  8:39 PM   Specimen: Stool  Result Value Ref Range Status   Campylobacter species NOT DETECTED NOT DETECTED Final   Plesimonas shigelloides NOT DETECTED NOT DETECTED Final   Salmonella species NOT DETECTED NOT DETECTED Final   Yersinia enterocolitica NOT DETECTED NOT DETECTED Final   Vibrio species NOT DETECTED NOT DETECTED Final   Vibrio cholerae NOT  DETECTED NOT DETECTED Final   Enteroaggregative E coli (EAEC) NOT DETECTED NOT DETECTED Final   Enteropathogenic E coli (EPEC) NOT DETECTED NOT DETECTED Final   Enterotoxigenic E coli (ETEC) NOT DETECTED NOT DETECTED Final   Shiga like toxin producing E coli (STEC) NOT DETECTED NOT DETECTED Final   Shigella/Enteroinvasive E coli (EIEC) NOT DETECTED NOT DETECTED Final   Cryptosporidium NOT DETECTED NOT DETECTED Final   Cyclospora cayetanensis NOT DETECTED NOT DETECTED Final   Entamoeba histolytica NOT DETECTED NOT DETECTED Final   Giardia lamblia NOT DETECTED NOT DETECTED Final   Adenovirus F40/41 NOT DETECTED NOT DETECTED Final   Astrovirus NOT DETECTED NOT DETECTED Final   Norovirus GI/GII NOT DETECTED NOT DETECTED Final   Rotavirus A NOT DETECTED NOT DETECTED Final   Sapovirus (I, II, IV, and V) NOT DETECTED NOT DETECTED Final    Comment: Performed at [facility], [address] [place], [place] [address]  C Difficile Quick Screen w PCR reflex     Status: None   Collection Time: 12/27/20  8:39 PM   Specimen: Stool  Result Value Ref Range Status   C Diff antigen NEGATIVE NEGATIVE Final   C Diff toxin NEGATIVE NEGATIVE Final   C Diff interpretation No C. difficile detected.  Final    Comment: Performed at [facility], [address] [place], [place] [address]  CULTURE, BLOOD (ROUTINE X 2) w Reflex to ID Panel     Status: None (Preliminary result)   Collection Time: 12/28/20  7:10 PM   Specimen: BLOOD  Result Value Ref Range Status   Specimen Description BLOOD BLOOD RIGHT HAND  Final   Special Requests   Final    BOTTLES DRAWN AEROBIC AND ANAEROBIC Blood Culture adequate volume   Culture   Final    NO GROWTH < 12 HOURS Performed at [facility], [address] [place], [place] [address]    Report Status PENDING  Incomplete  CULTURE, BLOOD (ROUTINE X 2) w Reflex to ID Panel     Status: None (Preliminary result)   Collection Time: 12/28/20  7:46 PM   Specimen: BLOOD  Result Value Ref Range Status   Specimen Description BLOOD BLOOD LEFT HAND  Final   Special Requests   Final    BOTTLES DRAWN AEROBIC AND ANAEROBIC Blood Culture adequate volume   Culture   Final    NO GROWTH < 12 HOURS Performed at [facility], [address], [place], [place] [address]    Report Status PENDING  Incomplete         Radiology Studies: CT ABDOMEN PELVIS WO CONTRAST  Result Date: 12/28/2020 CLINICAL DATA:  Flank pain, kidney stones suspected, right hydronephrosis by ultrasound EXAM: CT ABDOMEN AND PELVIS WITHOUT CONTRAST TECHNIQUE: Multidetector CT imaging of the abdomen and pelvis was performed following the standard protocol without IV contrast. COMPARISON:  Same day renal ultrasound, 12/28/2020 FINDINGS: Lower chest: Atelectasis and/or consolidation of the dependent right lung base. Hepatobiliary: No solid liver abnormality is seen. Hepatic steatosis. No gallstones, gallbladder wall thickening, or biliary dilatation. Pancreas: Unremarkable. No pancreatic ductal dilatation or surrounding inflammatory changes. Spleen: Normal in size without significant abnormality. Adrenals/Urinary Tract: Adrenal glands are unremarkable. Moderate right hydronephrosis with an oblong calculus at the right ureteropelvic  junction measuring 2.0 cm in length. Right-sided perinephric fat stranding. No additional calculi identified. Stomach/Bowel: Stomach is within normal limits. Appendix appears normal. No evidence of bowel wall thickening, distention, or inflammatory changes. Vascular/Lymphatic: Aortic atherosclerosis.  No enlarged abdominal or pelvic lymph nodes. Reproductive: Status post hysterectomy. Other: No abdominal wall hernia or abnormality. No abdominopelvic ascites. Musculoskeletal: No acute or significant osseous findings. IMPRESSION: 1. Moderate right hydronephrosis with an oblong calculus at the right ureteropelvic junction measuring 2.0 cm in length. Right-sided perinephric fat stranding. No additional calculi identified. 2. Hepatic steatosis. Aortic Atherosclerosis (ICD10-I70.0). Electronically Signed   By: Eddie Candle M.D.   On: 12/28/2020 12:49   US RENAL  Result Date: 12/28/2020 CLINICAL DATA:  Acute kidney injury EXAM: RENAL / URINARY TRACT ULTRASOUND COMPLETE COMPARISON:  None. FINDINGS: Right Kidney: Renal measurements: 12.5 x 6.7 x 7.5 cm = volume: 328 mL. Moderate hydronephrosis. No renal mass. No visualized shadowing stone. Left Kidney: Renal measurements: 11.1 x 4.6 x 5.1 cm = volume: Is 138 mL. Echogenicity within normal limits. No mass or hydronephrosis visualized. Bladder: Appears normal for degree of bladder distention. Patient unable to void at time of examination. Other: None. IMPRESSION: Moderate right hydronephrosis. Electronically Signed   By: Ulyses Jarred M.D.   On: 12/28/2020 00:56   DG Chest Portable 1 View  Result Date: 12/27/2020 CLINICAL DATA:  Shortness of breath EXAM: PORTABLE CHEST 1 VIEW COMPARISON:  None. FINDINGS: Cardiac shadow is within normal limits. Elevation of the right hemidiaphragm is noted. No focal infiltrate or effusion is seen. No acute bony abnormality is noted. IMPRESSION: No acute abnormality seen. Electronically Signed   By: Inez Catalina M.D.   On: 12/27/2020  19:08   DG OR UROLOGY CYSTO IMAGE ([facility] ONLY)  Result Date: 12/28/2020 There is no interpretation for this exam.  This order is for images obtained during a surgical procedure.  Please See "Surgeries" Tab for more information regarding the procedure.        Scheduled Meds: . atorvastatin  40 mg Oral QHS  . brimonidine  1 drop Both Eyes BID   And  . timolol  1 drop Both Eyes BID  . Chlorhexidine Gluconate Cloth  6 each Topical Daily  . feeding supplement  1 Container Oral TID BM  . insulin aspart  0-20 Units Subcutaneous Q4H  . insulin aspart  8 Units Subcutaneous TID WC  . insulin glargine  20 Units Subcutaneous Daily  . iron polysaccharides  150 mg Oral Daily  . latanoprost  1 drop Left Eye QHS  . [START ON 12/31/2020] pantoprazole  40 mg Intravenous Q12H   Continuous Infusions: . ceFEPime (MAXIPIME) IV Stopped (12/28/20 2052)  . diltiazem (CARDIZEM) infusion 5 mg/hr (12/29/20 0626)  . pantoprozole (PROTONIX) infusion 8 mg/hr (12/29/20 0626)  .  sodium bicarbonate (isotonic) infusion in sterile water 125 mL/hr at 12/29/20 0641     LOS: 2 days    Time spent: 36 minutes    Sharen Hones, MD [facility]   To contact the attending provider between 7A-7P or the covering provider during after hours 7P-7A, please log into the web site [email or web address] and access using universal [place] password for that web site. If you do not have the password, please call the hospital operator.  12/29/2020, 10:40 AM

## 2020-12-29 NOTE — Progress Notes (Signed)
Progress Note  Patient Name: Cynthia Lucas Date of Encounter: 12/29/2020  [facility] Cardiologist: new Kirke Corin)  Subjective   She underwent cystoscopy last night with right retrograde pyelogram and stent placement.  Hydronephrotic drip was purulent and antibiotics were broadened. She continues to be in atrial fibrillation but ventricular rate is more controlled.  No chest pain.  Inpatient Medications    Scheduled Meds: . atorvastatin  40 mg Oral QHS  . brimonidine  1 drop Both Eyes BID   And  . timolol  1 drop Both Eyes BID  . Chlorhexidine Gluconate Cloth  6 each Topical Daily  . cyanocobalamin  1,000 mcg Intramuscular Once  . feeding supplement  1 Container Oral TID BM  . insulin aspart  0-20 Units Subcutaneous Q4H  . insulin aspart  8 Units Subcutaneous TID WC  . insulin glargine  20 Units Subcutaneous Daily  . iron polysaccharides  150 mg Oral Daily  . latanoprost  1 drop Left Eye QHS  . [START ON 12/31/2020] pantoprazole  40 mg Intravenous Q12H   Continuous Infusions: . ceFEPime (MAXIPIME) IV Stopped (12/28/20 2052)  . iron sucrose    . pantoprozole (PROTONIX) infusion 8 mg/hr (12/29/20 0626)  .  sodium bicarbonate (isotonic) infusion in sterile water 125 mL/hr at 12/29/20 0641   PRN Meds: acetaminophen **OR** acetaminophen, ondansetron **OR** ondansetron (ZOFRAN) IV, traZODone   Vital Signs    Vitals:   12/29/20 0400 12/29/20 0530 12/29/20 0629 12/29/20 0754  BP: 95/65 111/67 110/75   Pulse: 79     Resp: 19     Temp: 97.8 F (36.6 C)     TempSrc: Axillary     SpO2: 100%     Weight: 94.7 kg   96.3 kg  Height:        Intake/Output Summary (Last 24 hours) at 12/29/2020 1228 Last data filed at 12/29/2020 0626 Gross per 24 hour  Intake 3534.83 ml  Output 200 ml  Net 3334.83 ml   Last 3 Weights 12/29/2020 12/29/2020 12/28/2020  Weight (lbs) 212 lb 6.4 oz 208 lb 12.4 oz 201 lb 15.1 oz  Weight (kg) 96.344 kg 94.7 kg 91.6 kg      Telemetry    Atrial  fibrillation with heart rate in the 80s.- Personally Reviewed  ECG    Not done today- Personally Reviewed  Physical Exam   GEN: No acute distress.   Neck: No JVD Cardiac:  Irregularly irregular, no murmurs, rubs, or gallops.  Respiratory: Clear to auscultation bilaterally. GI: Soft, nontender, non-distended  MS: No edema; No deformity. Neuro:  Nonfocal  Psych: Normal affect   Labs    High Sensitivity Troponin:   Recent Labs  Lab 12/27/20 1841 12/27/20 2039  TROPONINIHS 241* 224*      Chemistry Recent Labs  Lab 12/27/20 1841 12/28/20 0502 12/28/20 1504 12/29/20 0456  NA 137 136  --  131*  K 2.9* 3.0* 3.1* 3.3*  CL 100 105  --  98  CO2 21* 16*  --  21*  GLUCOSE 441* 295*  --  543*  BUN 73* 80*  --  87*  CREATININE 6.56* 6.81*  --  7.31*  CALCIUM 8.3* 7.9*  --  7.0*  PROT 7.9  --   --  6.0*  ALBUMIN 3.3*  --   --  2.4*  AST 38  --   --  25  ALT 21  --   --  20  ALKPHOS 58  --   --  55  BILITOT 0.7  --   --  0.5  GFRNONAA 6* 6*  --  6*  ANIONGAP 16* 15  --  12     Hematology Recent Labs  Lab 12/27/20 1841 12/28/20 0502 12/29/20 0456  WBC 12.3* 11.7* 9.8  RBC 5.24* 4.39 4.08  HGB 13.7 11.5* 10.8*  HCT 41.5 34.3* 32.0*  MCV 79.2* 78.1* 78.4*  MCH 26.1 26.2 26.5  MCHC 33.0 33.5 33.8  RDW 15.6* 15.4 16.3*  PLT 237 216 196    BNP Recent Labs  Lab 12/27/20 1841  BNP 530.7*     DDimer No results for input(s): DDIMER in the last 168 hours.   Radiology    CT ABDOMEN PELVIS WO CONTRAST  Result Date: 12/28/2020 CLINICAL DATA:  Flank pain, kidney stones suspected, right hydronephrosis by ultrasound EXAM: CT ABDOMEN AND PELVIS WITHOUT CONTRAST TECHNIQUE: Multidetector CT imaging of the abdomen and pelvis was performed following the standard protocol without IV contrast. COMPARISON:  Same day renal ultrasound, 12/28/2020 FINDINGS: Lower chest: Atelectasis and/or consolidation of the dependent right lung base. Hepatobiliary: No solid liver abnormality is  seen. Hepatic steatosis. No gallstones, gallbladder wall thickening, or biliary dilatation. Pancreas: Unremarkable. No pancreatic ductal dilatation or surrounding inflammatory changes. Spleen: Normal in size without significant abnormality. Adrenals/Urinary Tract: Adrenal glands are unremarkable. Moderate right hydronephrosis with an oblong calculus at the right ureteropelvic junction measuring 2.0 cm in length. Right-sided perinephric fat stranding. No additional calculi identified. Stomach/Bowel: Stomach is within normal limits. Appendix appears normal. No evidence of bowel wall thickening, distention, or inflammatory changes. Vascular/Lymphatic: Aortic atherosclerosis. No enlarged abdominal or pelvic lymph nodes. Reproductive: Status post hysterectomy. Other: No abdominal wall hernia or abnormality. No abdominopelvic ascites. Musculoskeletal: No acute or significant osseous findings. IMPRESSION: 1. Moderate right hydronephrosis with an oblong calculus at the right ureteropelvic junction measuring 2.0 cm in length. Right-sided perinephric fat stranding. No additional calculi identified. 2. Hepatic steatosis. Aortic Atherosclerosis (ICD10-I70.0). Electronically Signed   By: Lauralyn Primes M.D.   On: 12/28/2020 12:49   US RENAL  Result Date: 12/28/2020 CLINICAL DATA:  Acute kidney injury EXAM: RENAL / URINARY TRACT ULTRASOUND COMPLETE COMPARISON:  None. FINDINGS: Right Kidney: Renal measurements: 12.5 x 6.7 x 7.5 cm = volume: 328 mL. Moderate hydronephrosis. No renal mass. No visualized shadowing stone. Left Kidney: Renal measurements: 11.1 x 4.6 x 5.1 cm = volume: Is 138 mL. Echogenicity within normal limits. No mass or hydronephrosis visualized. Bladder: Appears normal for degree of bladder distention. Patient unable to void at time of examination. Other: None. IMPRESSION: Moderate right hydronephrosis. Electronically Signed   By: Deatra Robinson M.D.   On: 12/28/2020 00:56   DG Chest Portable 1 View  Result  Date: 12/27/2020 CLINICAL DATA:  Shortness of breath EXAM: PORTABLE CHEST 1 VIEW COMPARISON:  None. FINDINGS: Cardiac shadow is within normal limits. Elevation of the right hemidiaphragm is noted. No focal infiltrate or effusion is seen. No acute bony abnormality is noted. IMPRESSION: No acute abnormality seen. Electronically Signed   By: Alcide Clever M.D.   On: 12/27/2020 19:08   DG OR UROLOGY CYSTO IMAGE ([facility] ONLY)  Result Date: 12/28/2020 There is no interpretation for this exam.  This order is for images obtained during a surgical procedure.  Please See "Surgeries" Tab for more information regarding the procedure.    Cardiac Studies   Echocardiogram is pending  Patient Profile     70 y.o. female with no previous cardiac history.  She has history  of hypertension, GERD, type 2 diabetes, sleep apnea and essential tremor who presented with abdominal pain and coffee-ground emesis and was found to have acute renal failure and A. fib with RVR  Assessment & Plan    1.  Newly diagnosed atrial fibrillation with RVR: Exact onset is not entirely clear but this is in the setting of acute renal failure due to what seems to be obstructive uropathy with pyelonephritis and suspected GI bleed. Diltiazem drip is being weaned off.  I am going to start small dose metoprolol 12.5 mg twice daily. Echocardiogram is pending.   No anticoagulation is recommended at the present time due to suspected GI bleed as well as acute renal failure.  Once these issues improve, will start anticoagulation given chads vas score of 4.  2.  Mildly elevated troponin: Likely supply demand ischemia.  We are still waiting on echocardiogram to evaluate ejection fraction and wall motion.  If echocardiogram is unremarkable, the patient can proceed with endoscopic procedures from our standpoint. Further ischemic evaluation would be required in the future given her risk factors.  3.  Acute renal failure: Due to obstructive uropathy with  pyelonephritis: Still no significant improvement in renal function she might require dialysis.  4.  Suspected GI bleed: Followed by gastroenterology.  Hemoglobin is stable.       For questions or updates, please contact [facility] Please consult [email or web address] for contact info under        Signed, Lorine Bears, MD  12/29/2020, 12:28 PM

## 2020-12-29 NOTE — Progress Notes (Signed)
Urology Inpatient Progress Note  Subjective: No acute events overnight. Creatinine up today, 7.31.  WBC count down today, 8.9.  Blood cultures pending with no growth at <12 hours.  Urine cultures pending.  On antibiotics as below. Foley catheter in place draining clear, yellow urine. Patient denies flank or bladder discomfort today.  She does report burning right shoulder pain, not present at baseline, which started yesterday after IV placement in her right hand.  She denies shortness of breath, chest pressure, and nausea.  Anti-infectives: Anti-infectives (From admission, onward)   Start     Dose/Rate Route Frequency Ordered Stop   12/28/20 2000  ceFEPIme (MAXIPIME) 1 g in sodium chloride 0.9 % 100 mL IVPB        1 g 200 mL/hr over 30 Minutes Intravenous Daily 12/28/20 1826     12/28/20 1545  ceFAZolin (ANCEF) powder 2 g  Status:  Discontinued        2 g Other To Surgery 12/28/20 1457 12/28/20 1826      Current Facility-Administered Medications  Medication Dose Route Frequency Provider Last Rate Last Admin  . acetaminophen (TYLENOL) tablet 650 mg  650 mg Oral Q6H PRN Bjorn Pippin, MD       Or  . acetaminophen (TYLENOL) suppository 650 mg  650 mg Rectal Q6H PRN Bjorn Pippin, MD      . atorvastatin (LIPITOR) tablet 40 mg  40 mg Oral QHS Bjorn Pippin, MD   40 mg at 12/28/20 2010  . brimonidine (ALPHAGAN) 0.2 % ophthalmic solution 1 drop  1 drop Both Eyes BID Bjorn Pippin, MD   1 drop at 12/29/20 0845   And  . timolol (TIMOPTIC) 0.5 % ophthalmic solution 1 drop  1 drop Both Eyes BID Bjorn Pippin, MD   1 drop at 12/29/20 0841  . ceFEPIme (MAXIPIME) 1 g in sodium chloride 0.9 % 100 mL IVPB  1 g Intravenous Q2000 Marrion Coy, MD   Stopped at 12/28/20 2052  . Chlorhexidine Gluconate Cloth 2 % PADS 6 each  6 each Topical Daily Marrion Coy, MD      . diltiazem (CARDIZEM) 125 mg in dextrose 5% 125 mL (1 mg/mL) infusion  5-15 mg/hr Intravenous Continuous Bjorn Pippin, MD 5 mL/hr at 12/29/20 0626 5  mg/hr at 12/29/20 0626  . feeding supplement (BOOST / RESOURCE BREEZE) liquid 1 Container  1 Container Oral TID BM Bjorn Pippin, MD   1 Container at 12/28/20 2020  . insulin aspart (novoLOG) injection 0-20 Units  0-20 Units Subcutaneous Q4H Manuela Schwartz, NP   20 Units at 12/29/20 [phone]  . insulin aspart (novoLOG) injection 8 Units  8 Units Subcutaneous TID WC Marrion Coy, MD      . insulin glargine (LANTUS) injection 20 Units  20 Units Subcutaneous Daily Marrion Coy, MD   20 Units at 12/29/20 0844  . iron polysaccharides (NIFEREX) capsule 150 mg  150 mg Oral Daily Marrion Coy, MD   150 mg at 12/29/20 0842  . latanoprost (XALATAN) 0.005 % ophthalmic solution 1 drop  1 drop Left Eye Randall Hiss, MD   1 drop at 12/28/20 2013  . ondansetron (ZOFRAN) tablet 4 mg  4 mg Oral Q6H PRN Bjorn Pippin, MD       Or  . ondansetron [facility]) injection 4 mg  4 mg Intravenous Q6H PRN Bjorn Pippin, MD      . pantoprazole (PROTONIX) 80 mg in sodium chloride 0.9 % 100 mL (0.8 mg/mL) infusion  8 mg/hr Intravenous Continuous Annabell Howells,  John, MD 10 mL/hr at 12/29/20 0626 8 mg/hr at 12/29/20 0626  . [START ON 12/31/2020] pantoprazole (PROTONIX) injection 40 mg  40 mg Intravenous Q12H Bjorn Pippin, MD      . potassium chloride 10 mEq in 100 mL IVPB  10 mEq Intravenous Q1 Hr x 2 Manuela Schwartz, NP 100 mL/hr at 12/29/20 0649 10 mEq at 12/29/20 0649  . sodium bicarbonate 150 mEq in sterile water 1,000 mL infusion   Intravenous Continuous Manuela Schwartz, NP 125 mL/hr at 12/29/20 0641 New Bag at 12/29/20 0641  . traZODone (DESYREL) tablet 25 mg  25 mg Oral QHS PRN Bjorn Pippin, MD       Objective: Vital signs in last 24 hours: Temp:  [97 F (36.1 C)-98.6 F (37 C)] 97.8 F (36.6 C) (03/29 0400) Pulse Rate:  [59-110] 79 (03/29 0400) Resp:  [15-28] 19 (03/29 0400) BP: (82-111)/(57-86) 110/75 (03/29 0629) SpO2:  [94 %-100 %] 100 % (03/29 0400) Weight:  [91.6 kg-96.3 kg] 96.3 kg (03/29 0754)  Intake/Output from previous  day: 03/28 0701 - 03/29 0700 In: 3704.8 [P.O.:170; I.V.:2270.1; IV Piggyback:1264.8] Out: 200 [Urine:200] Intake/Output this shift: No intake/output data recorded.  Physical Exam Vitals and nursing note reviewed.  Constitutional:      General: She is not in acute distress.    Appearance: She is not ill-appearing, toxic-appearing or diaphoretic.  HENT:     Head: Normocephalic and atraumatic.  Pulmonary:     Effort: Pulmonary effort is normal. No respiratory distress.  Skin:    General: Skin is warm and dry.  Neurological:     Mental Status: She is alert and oriented to person, place, and time.     Motor: Tremor (Head) present.  Psychiatric:        Mood and Affect: Mood normal.        Behavior: Behavior normal.    Lab Results:  Recent Labs    12/28/20 0502 12/29/20 0456  WBC 11.7* 9.8  HGB 11.5* 10.8*  HCT 34.3* 32.0*  PLT 216 196   BMET Recent Labs    12/28/20 0502 12/28/20 1504 12/29/20 0456  NA 136  --  131*  K 3.0* 3.1* 3.3*  CL 105  --  98  CO2 16*  --  21*  GLUCOSE 295*  --  543*  BUN 80*  --  87*  CREATININE 6.81*  --  7.31*  CALCIUM 7.9*  --  7.0*   PT/INR Recent Labs    12/27/20 1841  LABPROT 14.3  INR 1.2   ABG Recent Labs    12/29/20 0456  HCO3 22.1   Assessment & Plan: 70 year old female s/p right ureteral stent placement with Dr. Annabell Howells for management of a 2 cm right UVJ stone with pyonephrosis and sepsis with intraoperative findings of chronic follicular cystitis.  Leukocytosis improving today, however creatinine is up trending.  I suspect this will begin to resolve tomorrow and recommend continued Foley catheter drainage for maximum urinary decompression until this occurs.  Patient appears to be tolerating both her stent and Foley catheter well without discomfort at this time.  We discussed her treatment plan moving forward.  I explained that she will require 14 days total of culture appropriate antibiotic therapy followed by outpatient  cystoscopy with laser lithotripsy and stent exchange in 2 to 3 weeks.  We discussed normal stent symptoms including flank pain, bladder discomfort, dysuria, urgency, frequency, and gross hematuria.  I counseled her to let us know if she develops any of  these so that we can manage nonpharmacologically.  She expressed understanding.  Recommendations: -Continue antibiotics and supportive care and follow cultures; she will require a total of 14 days of culture appropriate therapy -Continue Foley catheter until creatinine begins to downtrend -Outpatient URS/LL/stent exchange with Dr. Lonna Cobb in 2-3  Carman Ching, PA-C 12/29/2020

## 2020-12-29 NOTE — Progress Notes (Signed)
Mobility Specialist - Progress Note   12/29/20 1100  Mobility  Activity Dangled on edge of bed;Stood at bedside  Range of Motion/Exercises Active  Level of Assistance Minimal assist, patient does 75% or more  Assistive Device Front wheel walker  Distance Ambulated (ft) 0 ft  Mobility Response Tolerated well  Mobility performed by Mobility specialist  $Mobility charge 1 Mobility    Pre-mobility: 84 HR, 96% SpO2 During mobility: 109 HR, 90% SpO2 Post-mobility: 86 HR, 95% SpO2   Pt in bed upon arrival prepping for dry bath with set up at bedside. Mobility encouraged EOB for hygiene care, pt agreeable. Pt able to get EOB with minA. No dizziness upon sitting. Supervision - CGA for UB/LB cleaning. Pt stood at bedside (minA) from elevated bed height for peri-care. Mild dizziness with standing that did resolve with time. Mobility assisted with gown change d/t IV lines in BUE. Noted tremors requiring extra time for activity. Pt returned supine with supervision and voiced feeling "out of breath", PLB educated and utilized. Pt participated in dental hygiene. HR ranging between 80-109 bpm, O2 >/= 90% on RA.   Filiberto Pinks Mobility Specialist 12/29/20, 11:46 AM

## 2020-12-29 NOTE — Progress Notes (Signed)
*  PRELIMINARY RESULTS* Echocardiogram 2D Echocardiogram has been performed.  Cristela Blue 12/29/2020, 12:47 PM

## 2020-12-29 NOTE — Progress Notes (Signed)
[facility]  ROUNDING NOTE   Subjective:   Cynthia Lucas is a 70 y.o. female with past medical history of chronic cough, diabetes mellitus, GERD, hypertension and sleep apnea. She presents to the ED with complaints of weakness and tachycardia.   She is admitted for AKI   Patient is seen resting in bed Breakfast tray is at bedside Denies nausea Denies shortness of breath  Foley-200  Objective:  Vital signs in last 24 hours:  Temp:  [97 F (36.1 C)-98.6 F (37 C)] 97.8 F (36.6 C) (03/29 0400) Pulse Rate:  [59-110] 79 (03/29 0400) Resp:  [15-28] 19 (03/29 0400) BP: (82-111)/(57-86) 110/75 (03/29 0629) SpO2:  [94 %-100 %] 100 % (03/29 0400) Weight:  [94.7 kg-96.3 kg] 96.3 kg (03/29 0754)  Weight change: 2.695 kg Filed Weights   12/28/20 0900 12/29/20 0400 12/29/20 0754  Weight: 91.6 kg 94.7 kg 96.3 kg    Intake/Output: I/O last 3 completed shifts: In: 5885.5 [P.O.:170; I.V.:2350.7; IV Piggyback:3364.9] Out: 200 [Urine:200]   Intake/Output this shift:  Total I/O In: 240 [P.O.:240] Out: -   Physical Exam: General: NAD,   Head: Normocephalic, atraumatic. Moist oral mucosal membranes  Eyes: Anicteric, PERRL  Neck: Supple, trachea midline  Lungs:  Clear to auscultation  Heart: Regular rate and rhythm  Abdomen:  Soft, nontender,   Extremities:  min peripheral edema.  Neurologic: Nonfocal, moving all four extremities  Skin: No lesions       Basic Metabolic Panel: Recent Labs  Lab 12/27/20 1841 12/27/20 2039 12/28/20 0502 12/28/20 1504 12/29/20 0456  NA 137  --  136  --  131*  K 2.9*  --  3.0* 3.1* 3.3*  CL 100  --  105  --  98  CO2 21*  --  16*  --  21*  GLUCOSE 441*  --  295*  --  543*  BUN 73*  --  80*  --  87*  CREATININE 6.56*  --  6.81*  --  7.31*  CALCIUM 8.3*  --  7.9*  --  7.0*  MG 2.2 2.1  --  1.9  --   PHOS  --   --   --   --  4.5  4.5    Liver Function Tests: Recent Labs  Lab 12/27/20 1841 12/29/20 0456  AST 38 25  ALT  21 20  ALKPHOS 58 55  BILITOT 0.7 0.5  PROT 7.9 6.0*  ALBUMIN 3.3* 2.4*   No results for input(s): LIPASE, AMYLASE in the last 168 hours. No results for input(s): AMMONIA in the last 168 hours.  CBC: Recent Labs  Lab 12/27/20 1841 12/28/20 0502 12/29/20 0456  WBC 12.3* 11.7* 9.8  NEUTROABS 10.9*  --  8.7*  HGB 13.7 11.5* 10.8*  HCT 41.5 34.3* 32.0*  MCV 79.2* 78.1* 78.4*  PLT 237 216 196    Cardiac Enzymes: No results for input(s): CKTOTAL, CKMB, CKMBINDEX, TROPONINI in the last 168 hours.  BNP: Invalid input(s): POCBNP  CBG: Recent Labs  Lab 12/28/20 2039 12/29/20 0409 12/29/20 0610 12/29/20 0753 12/29/20 1225  GLUCAP 339* 488* 460* 392* 372*    Microbiology: Results for orders placed or performed during the hospital encounter of 12/27/20  Resp Panel by RT-PCR (Flu A&B, Covid) Nasopharyngeal Swab     Status: None   Collection Time: 12/27/20  6:48 PM   Specimen: Nasopharyngeal Swab; Nasopharyngeal(NP) swabs in vial transport medium  Result Value Ref Range Status   SARS Coronavirus 2 by RT PCR NEGATIVE  NEGATIVE Final    Comment: (NOTE) SARS-CoV-2 target nucleic acids are NOT DETECTED.  The SARS-CoV-2 RNA is generally detectable in upper respiratory specimens during the acute phase of infection. The lowest concentration of SARS-CoV-2 viral copies this assay can detect is 138 copies/mL. A negative result does not preclude SARS-Cov-2 infection and should not be used as the sole basis for treatment or other patient management decisions. A negative result may occur with  improper specimen collection/handling, submission of specimen other than nasopharyngeal swab, presence of viral mutation(s) within the areas targeted by this assay, and inadequate number of viral copies(<138 copies/mL). A negative result must be combined with clinical observations, patient history, and epidemiological information. The expected result is Negative.  Fact Sheet for Patients:   BloggerCourse.com  Fact Sheet for Healthcare Providers:  SeriousBroker.it  This test is no t yet approved or cleared by the [place] FDA and  has been authorized for detection and/or diagnosis of SARS-CoV-2 by FDA under an Emergency Use Authorization (EUA). This EUA will remain  in effect (meaning this test can be used) for the duration of the COVID-19 declaration under Section 564(b)(1) of the Act, 21 U.S.C.section 360bbb-3(b)(1), unless the authorization is terminated  or revoked sooner.       Influenza A by PCR NEGATIVE NEGATIVE Final   Influenza B by PCR NEGATIVE NEGATIVE Final    Comment: (NOTE) The Xpert Xpress SARS-CoV-2/FLU/RSV plus assay is intended as an aid in the diagnosis of influenza from Nasopharyngeal swab specimens and should not be used as a sole basis for treatment. Nasal washings and aspirates are unacceptable for Xpert Xpress SARS-CoV-2/FLU/RSV testing.  Fact Sheet for Patients: BloggerCourse.com  Fact Sheet for Healthcare Providers: SeriousBroker.it  This test is not yet approved or cleared by the [place] FDA and has been authorized for detection and/or diagnosis of SARS-CoV-2 by FDA under an Emergency Use Authorization (EUA). This EUA will remain in effect (meaning this test can be used) for the duration of the COVID-19 declaration under Section 564(b)(1) of the Act, 21 U.S.C. section 360bbb-3(b)(1), unless the authorization is terminated or revoked.  Performed at [facility], [address] [place], [place] [address]   Gastrointestinal Panel by PCR , Stool     Status: None   Collection Time: 12/27/20  8:39 PM   Specimen: Stool  Result Value Ref Range Status   Campylobacter species NOT DETECTED NOT DETECTED Final   Plesimonas shigelloides NOT DETECTED NOT DETECTED Final   Salmonella species NOT DETECTED NOT DETECTED Final    Yersinia enterocolitica NOT DETECTED NOT DETECTED Final   Vibrio species NOT DETECTED NOT DETECTED Final   Vibrio cholerae NOT DETECTED NOT DETECTED Final   Enteroaggregative E coli (EAEC) NOT DETECTED NOT DETECTED Final   Enteropathogenic E coli (EPEC) NOT DETECTED NOT DETECTED Final   Enterotoxigenic E coli (ETEC) NOT DETECTED NOT DETECTED Final   Shiga like toxin producing E coli (STEC) NOT DETECTED NOT DETECTED Final   Shigella/Enteroinvasive E coli (EIEC) NOT DETECTED NOT DETECTED Final   Cryptosporidium NOT DETECTED NOT DETECTED Final   Cyclospora cayetanensis NOT DETECTED NOT DETECTED Final   Entamoeba histolytica NOT DETECTED NOT DETECTED Final   Giardia lamblia NOT DETECTED NOT DETECTED Final   Adenovirus F40/41 NOT DETECTED NOT DETECTED Final   Astrovirus NOT DETECTED NOT DETECTED Final   Norovirus GI/GII NOT DETECTED NOT DETECTED Final   Rotavirus A NOT DETECTED NOT DETECTED Final   Sapovirus (I, II, IV, and V) NOT DETECTED NOT  DETECTED Final    Comment: Performed at [facility], [address] [place], [place] [address]  C Difficile Quick Screen w PCR reflex     Status: None   Collection Time: 12/27/20  8:39 PM   Specimen: Stool  Result Value Ref Range Status   C Diff antigen NEGATIVE NEGATIVE Final   C Diff toxin NEGATIVE NEGATIVE Final   C Diff interpretation No C. difficile detected.  Final    Comment: Performed at [facility], [address] [place], [place] [address]  CULTURE, BLOOD (ROUTINE X 2) w Reflex to ID Panel     Status: None (Preliminary result)   Collection Time: 12/28/20  7:10 PM   Specimen: BLOOD  Result Value Ref Range Status   Specimen Description BLOOD BLOOD RIGHT HAND  Final   Special Requests   Final    BOTTLES DRAWN AEROBIC AND ANAEROBIC Blood Culture adequate volume   Culture   Final    NO GROWTH < 12 HOURS Performed at [facility], [address], [place], [place] [address]    Report Status PENDING   Incomplete  CULTURE, BLOOD (ROUTINE X 2) w Reflex to ID Panel     Status: None (Preliminary result)   Collection Time: 12/28/20  7:46 PM   Specimen: BLOOD  Result Value Ref Range Status   Specimen Description BLOOD BLOOD LEFT HAND  Final   Special Requests   Final    BOTTLES DRAWN AEROBIC AND ANAEROBIC Blood Culture adequate volume   Culture   Final    NO GROWTH < 12 HOURS Performed at [facility], [address], [place], [place] [address]    Report Status PENDING  Incomplete    Coagulation Studies: Recent Labs    12/27/20 1841  LABPROT 14.3  INR 1.2    Urinalysis: No results for input(s): COLORURINE, LABSPEC, PHURINE, GLUCOSEU, HGBUR, BILIRUBINUR, KETONESUR, PROTEINUR, UROBILINOGEN, NITRITE, LEUKOCYTESUR in the last 72 hours.  Invalid input(s): APPERANCEUR    Imaging: CT ABDOMEN PELVIS WO CONTRAST  Result Date: 12/28/2020 CLINICAL DATA:  Flank pain, kidney stones suspected, right hydronephrosis by ultrasound EXAM: CT ABDOMEN AND PELVIS WITHOUT CONTRAST TECHNIQUE: Multidetector CT imaging of the abdomen and pelvis was performed following the standard protocol without IV contrast. COMPARISON:  Same day renal ultrasound, 12/28/2020 FINDINGS: Lower chest: Atelectasis and/or consolidation of the dependent right lung base. Hepatobiliary: No solid liver abnormality is seen. Hepatic steatosis. No gallstones, gallbladder wall thickening, or biliary dilatation. Pancreas: Unremarkable. No pancreatic ductal dilatation or surrounding inflammatory changes. Spleen: Normal in size without significant abnormality. Adrenals/Urinary Tract: Adrenal glands are unremarkable. Moderate right hydronephrosis with an oblong calculus at the right ureteropelvic junction measuring 2.0 cm in length. Right-sided perinephric fat stranding. No additional calculi identified. Stomach/Bowel: Stomach is within normal limits. Appendix appears normal. No evidence of bowel wall thickening, distention, or  inflammatory changes. Vascular/Lymphatic: Aortic atherosclerosis. No enlarged abdominal or pelvic lymph nodes. Reproductive: Status post hysterectomy. Other: No abdominal wall hernia or abnormality. No abdominopelvic ascites. Musculoskeletal: No acute or significant osseous findings. IMPRESSION: 1. Moderate right hydronephrosis with an oblong calculus at the right ureteropelvic junction measuring 2.0 cm in length. Right-sided perinephric fat stranding. No additional calculi identified. 2. Hepatic steatosis. Aortic Atherosclerosis (ICD10-I70.0). Electronically Signed   By: Lauralyn Primes M.D.   On: 12/28/2020 12:49   US RENAL  Result Date: 12/28/2020 CLINICAL DATA:  Acute kidney injury EXAM: RENAL / URINARY TRACT ULTRASOUND COMPLETE COMPARISON:  None. FINDINGS: Right Kidney: Renal  measurements: 12.5 x 6.7 x 7.5 cm = volume: 328 mL. Moderate hydronephrosis. No renal mass. No visualized shadowing stone. Left Kidney: Renal measurements: 11.1 x 4.6 x 5.1 cm = volume: Is 138 mL. Echogenicity within normal limits. No mass or hydronephrosis visualized. Bladder: Appears normal for degree of bladder distention. Patient unable to void at time of examination. Other: None. IMPRESSION: Moderate right hydronephrosis. Electronically Signed   By: Deatra Robinson M.D.   On: 12/28/2020 00:56   DG Chest Portable 1 View  Result Date: 12/27/2020 CLINICAL DATA:  Shortness of breath EXAM: PORTABLE CHEST 1 VIEW COMPARISON:  None. FINDINGS: Cardiac shadow is within normal limits. Elevation of the right hemidiaphragm is noted. No focal infiltrate or effusion is seen. No acute bony abnormality is noted. IMPRESSION: No acute abnormality seen. Electronically Signed   By: Alcide Clever M.D.   On: 12/27/2020 19:08   DG OR UROLOGY CYSTO IMAGE ([facility] ONLY)  Result Date: 12/28/2020 There is no interpretation for this exam.  This order is for images obtained during a surgical procedure.  Please See "Surgeries" Tab for more information regarding  the procedure.     Medications:   . ceFEPime (MAXIPIME) IV Stopped (12/28/20 2052)  . iron sucrose    . pantoprozole (PROTONIX) infusion 8 mg/hr (12/29/20 0626)  . potassium chloride    .  sodium bicarbonate (isotonic) infusion in sterile water 125 mL/hr at 12/29/20 0641   . atorvastatin  40 mg Oral QHS  . brimonidine  1 drop Both Eyes BID   And  . timolol  1 drop Both Eyes BID  . Chlorhexidine Gluconate Cloth  6 each Topical Daily  . feeding supplement  1 Container Oral TID BM  . insulin aspart  0-20 Units Subcutaneous Q4H  . insulin glargine  20 Units Subcutaneous Daily  . iron polysaccharides  150 mg Oral Daily  . latanoprost  1 drop Left Eye QHS  . metoprolol tartrate  12.5 mg Oral BID  . [START ON 12/31/2020] pantoprazole  40 mg Intravenous Q12H   acetaminophen **OR** acetaminophen, ondansetron **OR** ondansetron (ZOFRAN) IV, traZODone  Assessment/ Plan:  Ms. Karinna Beadles is a 70 y.o.  female with past medical history of chronic cough, diabetes mellitus, GERD, hypertension and sleep apnea.  1. Acute Kidney Injury on chronic kidney disease stage 3B with baseline creatinine 1.26 and GFR of 44 on 01/08/2019.  Acute kidney injury secondary to prerenal obstruction Urology consulted and cleared obstructive stone Foley placed Producing clear yellow urine Will continue to monitor renal function  Lab Results  Component Value Date   CREATININE 7.31 (H) 12/29/2020   CREATININE 6.81 (H) 12/28/2020   CREATININE 6.56 (H) 12/27/2020    Intake/Output Summary (Last 24 hours) at 12/29/2020 1416 Last data filed at 12/29/2020 1330 Gross per 24 hour  Intake 3774.83 ml  Output 200 ml  Net 3574.83 ml   2. Hypertension  BP stable on current regimen Dilt drip d/c'd  3 Metabolic acidosis - IV bicarb infusion -Metoprolol  4. Hypokalemia Current level 3.3 Oral supplement given    LOS: 2 Yuleidy Rappleye 3/29/20222:16 PM

## 2020-12-29 NOTE — Progress Notes (Addendum)
Cross Cover Nurse reports patient cold clammy and cbg 488.  Prior labs with metabolic acidosis and AKI - on bicarb Repeat labs Given 20 units sq insulin and put on Q4h scale - bicarb drip in D5 discontinued and changed to bicarb in isotonic fluids Given low potassium in setting of hyperglycemia, aggressive potassium supplementation initiated 1 Liter of fluid  I

## 2020-12-30 ENCOUNTER — Inpatient Hospital Stay: Payer: Medicare Other

## 2020-12-30 DIAGNOSIS — N10 Acute tubulo-interstitial nephritis: Secondary | ICD-10-CM | POA: Diagnosis not present

## 2020-12-30 DIAGNOSIS — I4891 Unspecified atrial fibrillation: Secondary | ICD-10-CM | POA: Diagnosis not present

## 2020-12-30 DIAGNOSIS — N130 Hydronephrosis with ureteropelvic junction obstruction: Secondary | ICD-10-CM | POA: Diagnosis not present

## 2020-12-30 DIAGNOSIS — N170 Acute kidney failure with tubular necrosis: Secondary | ICD-10-CM | POA: Diagnosis not present

## 2020-12-30 LAB — URINALYSIS, COMPLETE (UACMP) WITH MICROSCOPIC
Bilirubin Urine: NEGATIVE
Glucose, UA: NEGATIVE mg/dL
Ketones, ur: NEGATIVE mg/dL
Nitrite: NEGATIVE
Protein, ur: 30 mg/dL — AB
Specific Gravity, Urine: 1.009 (ref 1.005–1.030)
Squamous Epithelial / HPF: NONE SEEN (ref 0–5)
WBC, UA: >50 WBC/hpf — ABNORMAL HIGH (ref 0–5)
pH: 5 (ref 5.0–8.0)

## 2020-12-30 LAB — PROTEIN ELECTROPHORESIS, SERUM
A/G Ratio: 0.7 (ref 0.7–1.7)
Albumin ELP: 2.4 g/dL — ABNORMAL LOW (ref 2.9–4.4)
Alpha-1-Globulin: 0.4 g/dL (ref 0.0–0.4)
Alpha-2-Globulin: 1.2 g/dL — ABNORMAL HIGH (ref 0.4–1.0)
Beta Globulin: 1 g/dL (ref 0.7–1.3)
Gamma Globulin: 0.8 g/dL (ref 0.4–1.8)
Globulin, Total: 3.4 g/dL (ref 2.2–3.9)
Total Protein ELP: 5.8 g/dL — ABNORMAL LOW (ref 6.0–8.5)

## 2020-12-30 LAB — PROTEIN / CREATININE RATIO, URINE
Creatinine, Urine: 74 mg/dL
Protein Creatinine Ratio: 0.72 mg/mg{Cre} — ABNORMAL HIGH (ref 0.00–0.15)
Total Protein, Urine: 53 mg/dL

## 2020-12-30 LAB — COMPREHENSIVE METABOLIC PANEL
ALT: 14 U/L (ref 0–44)
AST: 23 U/L (ref 15–41)
Albumin: 2.2 g/dL — ABNORMAL LOW (ref 3.5–5.0)
Alkaline Phosphatase: 53 U/L (ref 38–126)
Anion gap: 13 (ref 5–15)
BUN: 88 mg/dL — ABNORMAL HIGH (ref 8–23)
CO2: 27 mmol/L (ref 22–32)
Calcium: 6.8 mg/dL — ABNORMAL LOW (ref 8.9–10.3)
Chloride: 98 mmol/L (ref 98–111)
Creatinine, Ser: 6.31 mg/dL — ABNORMAL HIGH (ref 0.44–1.00)
GFR, Estimated: 7 mL/min — ABNORMAL LOW (ref >60)
Glucose, Bld: 133 mg/dL — ABNORMAL HIGH (ref 70–99)
Potassium: 2.7 mmol/L — CL (ref 3.5–5.1)
Sodium: 138 mmol/L (ref 135–145)
Total Bilirubin: 0.6 mg/dL (ref 0.3–1.2)
Total Protein: 5.8 g/dL — ABNORMAL LOW (ref 6.5–8.1)

## 2020-12-30 LAB — GLUCOSE, CAPILLARY
Glucose-Capillary: 103 mg/dL — ABNORMAL HIGH (ref 70–99)
Glucose-Capillary: 131 mg/dL — ABNORMAL HIGH (ref 70–99)
Glucose-Capillary: 137 mg/dL — ABNORMAL HIGH (ref 70–99)
Glucose-Capillary: 146 mg/dL — ABNORMAL HIGH (ref 70–99)
Glucose-Capillary: 153 mg/dL — ABNORMAL HIGH (ref 70–99)
Glucose-Capillary: 161 mg/dL — ABNORMAL HIGH (ref 70–99)
Glucose-Capillary: 175 mg/dL — ABNORMAL HIGH (ref 70–99)
Glucose-Capillary: 214 mg/dL — ABNORMAL HIGH (ref 70–99)

## 2020-12-30 LAB — CBC WITH DIFFERENTIAL/PLATELET
Abs Immature Granulocytes: 0.2 10*3/uL — ABNORMAL HIGH (ref 0.00–0.07)
Basophils Absolute: 0 10*3/uL (ref 0.0–0.1)
Basophils Relative: 0 %
Eosinophils Absolute: 0.1 10*3/uL (ref 0.0–0.5)
Eosinophils Relative: 0 %
HCT: 32 % — ABNORMAL LOW (ref 36.0–46.0)
Hemoglobin: 10.7 g/dL — ABNORMAL LOW (ref 12.0–15.0)
Immature Granulocytes: 2 %
Lymphocytes Relative: 8 %
Lymphs Abs: 1 10*3/uL (ref 0.7–4.0)
MCH: 25.7 pg — ABNORMAL LOW (ref 26.0–34.0)
MCHC: 33.4 g/dL (ref 30.0–36.0)
MCV: 76.9 fL — ABNORMAL LOW (ref 80.0–100.0)
Monocytes Absolute: 1 10*3/uL (ref 0.1–1.0)
Monocytes Relative: 8 %
Neutro Abs: 10.6 10*3/uL — ABNORMAL HIGH (ref 1.7–7.7)
Neutrophils Relative %: 82 %
Platelets: 242 10*3/uL (ref 150–400)
RBC: 4.16 MIL/uL (ref 3.87–5.11)
RDW: 15.7 % — ABNORMAL HIGH (ref 11.5–15.5)
WBC: 12.9 10*3/uL — ABNORMAL HIGH (ref 4.0–10.5)
nRBC: 0 % (ref 0.0–0.2)

## 2020-12-30 LAB — HOMOCYSTEINE: Homocysteine: 34.6 umol/L — ABNORMAL HIGH (ref 0.0–17.2)

## 2020-12-30 LAB — PHOSPHORUS: Phosphorus: 2.5 mg/dL (ref 2.5–4.6)

## 2020-12-30 LAB — MAGNESIUM: Magnesium: 1.6 mg/dL — ABNORMAL LOW (ref 1.7–2.4)

## 2020-12-30 LAB — POTASSIUM: Potassium: 3 mmol/L — ABNORMAL LOW (ref 3.5–5.1)

## 2020-12-30 MED ORDER — POTASSIUM CHLORIDE CRYS ER 20 MEQ PO TBCR
40.0000 meq | EXTENDED_RELEASE_TABLET | ORAL | Status: AC
  Administered 2020-12-30 (×2): 40 meq via ORAL
  Filled 2020-12-30 (×2): qty 2

## 2020-12-30 MED ORDER — LACTATED RINGERS IV SOLN: INTRAVENOUS | Status: DC

## 2020-12-30 MED ORDER — DILTIAZEM HCL 30 MG PO TABS
30.0000 mg | ORAL_TABLET | Freq: Four times a day (QID) | ORAL | Status: DC
  Administered 2020-12-30 – 2020-12-31 (×3): 30 mg via ORAL
  Filled 2020-12-30 (×2): qty 1

## 2020-12-30 MED ORDER — K PHOS MONO-SOD PHOS DI & MONO 155-852-130 MG PO TABS
500.0000 mg | ORAL_TABLET | Freq: Two times a day (BID) | ORAL | Status: DC
Start: 2020-12-30 — End: 2020-12-31
  Administered 2020-12-30 – 2020-12-31 (×3): 500 mg via ORAL
  Filled 2020-12-30 (×4): qty 2

## 2020-12-30 MED ORDER — HYDROCODONE-ACETAMINOPHEN 5-325 MG PO TABS
1.0000 | ORAL_TABLET | Freq: Once | ORAL | Status: AC
  Administered 2020-12-30: 1 via ORAL
  Filled 2020-12-30: qty 1

## 2020-12-30 MED ORDER — HYDROCODONE-ACETAMINOPHEN 5-325 MG PO TABS
1.0000 | ORAL_TABLET | Freq: Once | ORAL | Status: DC | PRN
Start: 2020-12-30 — End: 2021-01-03

## 2020-12-30 MED ORDER — MAGNESIUM OXIDE 400 (241.3 MG) MG PO TABS
400.0000 mg | ORAL_TABLET | Freq: Every day | ORAL | Status: DC
  Administered 2020-12-30 – 2021-01-03 (×4): 400 mg via ORAL
  Filled 2020-12-30 (×5): qty 1

## 2020-12-30 MED ORDER — POTASSIUM CHLORIDE 10 MEQ/100ML IV SOLN
10.0000 meq | INTRAVENOUS | Status: AC
Start: 2020-12-30 — End: 2020-12-30
  Administered 2020-12-30 (×4): 10 meq via INTRAVENOUS
  Filled 2020-12-30 (×4): qty 100

## 2020-12-30 MED ORDER — TAMSULOSIN HCL 0.4 MG PO CAPS
0.4000 mg | ORAL_CAPSULE | Freq: Every day | ORAL | Status: DC
  Administered 2020-12-30 – 2021-01-03 (×4): 0.4 mg via ORAL
  Filled 2020-12-30 (×5): qty 1

## 2020-12-30 MED ORDER — POTASSIUM CHLORIDE CRYS ER 20 MEQ PO TBCR
40.0000 meq | EXTENDED_RELEASE_TABLET | Freq: Once | ORAL | Status: AC
  Administered 2020-12-30: 40 meq via ORAL
  Filled 2020-12-30: qty 2

## 2020-12-30 MED ORDER — DILTIAZEM HCL-DEXTROSE 125-5 MG/125ML-% IV SOLN (PREMIX)
5.0000 mg/h | INTRAVENOUS | Status: DC
  Administered 2020-12-30: 5 mg/h via INTRAVENOUS
  Filled 2020-12-30: qty 125

## 2020-12-30 MED ORDER — MAGNESIUM SULFATE 2 GM/50ML IV SOLN
2.0000 g | INTRAVENOUS | Status: AC
Start: 2020-12-30 — End: 2020-12-30
  Administered 2020-12-30 (×2): 2 g via INTRAVENOUS
  Filled 2020-12-30 (×2): qty 50

## 2020-12-30 NOTE — Progress Notes (Signed)
Progress Note  Patient Name: Elyana Grabski Date of Encounter: 12/30/2020  [facility] Cardiologist: new Kirke Corin)  Subjective   Diltiazem drip was weaned off yesterday but she had A. fib with RVR this morning and thus it was resumed at 5 mg/h.  Ventricular rate improved.  She denies chest pain, shortness of breath or palpitations.  She still feels weak.  Inpatient Medications    Scheduled Meds: . atorvastatin  40 mg Oral QHS  . brimonidine  1 drop Both Eyes BID   And  . timolol  1 drop Both Eyes BID  . Chlorhexidine Gluconate Cloth  6 each Topical Daily  . feeding supplement  1 Container Oral TID BM  . insulin aspart  0-20 Units Subcutaneous Q4H  . insulin glargine  20 Units Subcutaneous Daily  . iron polysaccharides  150 mg Oral Daily  . latanoprost  1 drop Left Eye QHS  . magnesium oxide  400 mg Oral Daily  . metoprolol tartrate  12.5 mg Oral BID  . [START ON 12/31/2020] pantoprazole  40 mg Intravenous Q12H  . phosphorus  500 mg Oral BID   Continuous Infusions: . ceFEPime (MAXIPIME) IV Stopped (12/29/20 2053)  . diltiazem (CARDIZEM) infusion 5 mg/hr (12/30/20 1004)  . lactated ringers 125 mL/hr at 12/30/20 8250  . pantoprozole (PROTONIX) infusion 8 mg/hr (12/30/20 0627)   PRN Meds: acetaminophen **OR** acetaminophen, ondansetron **OR** ondansetron (ZOFRAN) IV, traZODone   Vital Signs    Vitals:   12/30/20 0700 12/30/20 0751 12/30/20 1011 12/30/20 1136  BP: 105/71 102/81 115/68 102/81  Pulse: (!) 142 (!) 121 (!) 113 92  Resp: 20 20  14   Temp: 97.9 F (36.6 C) 98 F (36.7 C)  98.1 F (36.7 C)  TempSrc: Oral Oral  Oral  SpO2: 96% 96%  96%  Weight:      Height:        Intake/Output Summary (Last 24 hours) at 12/30/2020 1158 Last data filed at 12/30/2020 1140 Gross per 24 hour  Intake 3601.91 ml  Output 1925 ml  Net 1676.91 ml   Last 3 Weights 12/29/2020 12/29/2020 12/28/2020  Weight (lbs) 212 lb 6.4 oz 208 lb 12.4 oz 201 lb 15.1 oz  Weight (kg) 96.344 kg  94.7 kg 91.6 kg      Telemetry    Atrial fibrillation with heart rate in the 90s.- Personally Reviewed  ECG    Not done today- Personally Reviewed  Physical Exam   GEN: No acute distress.   Neck: No JVD Cardiac:  Irregularly irregular, no murmurs, rubs, or gallops.  Respiratory: Clear to auscultation bilaterally. GI: Soft, nontender, non-distended  MS: No edema; No deformity. Neuro:  Nonfocal  Psych: Normal affect   Labs    High Sensitivity Troponin:   Recent Labs  Lab 12/27/20 1841 12/27/20 2039  TROPONINIHS 241* 224*      Chemistry Recent Labs  Lab 12/27/20 1841 12/28/20 0502 12/28/20 1504 12/29/20 0456 12/30/20 0341  NA 137 136  --  131* 138  K 2.9* 3.0* 3.1* 3.3* 2.7*  CL 100 105  --  98 98  CO2 21* 16*  --  21* 27  GLUCOSE 441* 295*  --  543* 133*  BUN 73* 80*  --  87* 88*  CREATININE 6.56* 6.81*  --  7.31* 6.31*  CALCIUM 8.3* 7.9*  --  7.0* 6.8*  PROT 7.9  --   --  6.0* 5.8*  ALBUMIN 3.3*  --   --  2.4* 2.2*  AST 38  --   --  25 23  ALT 21  --   --  20 14  ALKPHOS 58  --   --  55 53  BILITOT 0.7  --   --  0.5 0.6  GFRNONAA 6* 6*  --  6* 7*  ANIONGAP 16* 15  --  12 13     Hematology Recent Labs  Lab 12/28/20 0502 12/29/20 0456 12/30/20 0341  WBC 11.7* 9.8 12.9*  RBC 4.39 4.08 4.16  HGB 11.5* 10.8* 10.7*  HCT 34.3* 32.0* 32.0*  MCV 78.1* 78.4* 76.9*  MCH 26.2 26.5 25.7*  MCHC 33.5 33.8 33.4  RDW 15.4 16.3* 15.7*  PLT 216 196 242    BNP Recent Labs  Lab 12/27/20 1841  BNP 530.7*     DDimer No results for input(s): DDIMER in the last 168 hours.   Radiology    CT ABDOMEN PELVIS WO CONTRAST  Result Date: 12/28/2020 CLINICAL DATA:  Flank pain, kidney stones suspected, right hydronephrosis by ultrasound EXAM: CT ABDOMEN AND PELVIS WITHOUT CONTRAST TECHNIQUE: Multidetector CT imaging of the abdomen and pelvis was performed following the standard protocol without IV contrast. COMPARISON:  Same day renal ultrasound, 12/28/2020  FINDINGS: Lower chest: Atelectasis and/or consolidation of the dependent right lung base. Hepatobiliary: No solid liver abnormality is seen. Hepatic steatosis. No gallstones, gallbladder wall thickening, or biliary dilatation. Pancreas: Unremarkable. No pancreatic ductal dilatation or surrounding inflammatory changes. Spleen: Normal in size without significant abnormality. Adrenals/Urinary Tract: Adrenal glands are unremarkable. Moderate right hydronephrosis with an oblong calculus at the right ureteropelvic junction measuring 2.0 cm in length. Right-sided perinephric fat stranding. No additional calculi identified. Stomach/Bowel: Stomach is within normal limits. Appendix appears normal. No evidence of bowel wall thickening, distention, or inflammatory changes. Vascular/Lymphatic: Aortic atherosclerosis. No enlarged abdominal or pelvic lymph nodes. Reproductive: Status post hysterectomy. Other: No abdominal wall hernia or abnormality. No abdominopelvic ascites. Musculoskeletal: No acute or significant osseous findings. IMPRESSION: 1. Moderate right hydronephrosis with an oblong calculus at the right ureteropelvic junction measuring 2.0 cm in length. Right-sided perinephric fat stranding. No additional calculi identified. 2. Hepatic steatosis. Aortic Atherosclerosis (ICD10-I70.0). Electronically Signed   By: Lauralyn Primes M.D.   On: 12/28/2020 12:49   DG OR UROLOGY CYSTO IMAGE ([facility] ONLY)  Result Date: 12/28/2020 There is no interpretation for this exam.  This order is for images obtained during a surgical procedure.  Please See "Surgeries" Tab for more information regarding the procedure.   ECHOCARDIOGRAM COMPLETE  Result Date: 12/29/2020    ECHOCARDIOGRAM REPORT   Patient Name:   LETHIA DONLON Date of Exam: 12/29/2020 Medical Rec #:  [id number]     Height:       67.0 in Accession #:    [id number]    Weight:       212.4 lb Date of Birth:  [birth year]     BSA:          2.075 m Patient Age:    70 years      BP:            110/75 mmHg Patient Gender: F             HR:           81 bpm. Exam Location:  [facility] Procedure: 2D Echo, Cardiac Doppler and Color Doppler Indications:     Atrial Fibrillation I48.91  History:         Patient has no prior history of Echocardiogram examinations.  Risk Factors:Hypertension and Diabetes.  Sonographer:     Cristela Blue RDCS (AE) Referring Phys:  [id number] Bjorn Pippin Diagnosing Phys: Lorine Bears MD  Sonographer Comments: No apical window, no subcostal window and Technically challenging study due to limited acoustic windows. IMPRESSIONS  1. Left ventricular ejection fraction, by estimation, is 55 to 60%. The left ventricle has normal function. The left ventricle has no regional wall motion abnormalities. There is mild left ventricular hypertrophy. Left ventricular diastolic function could not be evaluated.  2. Right ventricular systolic function is normal. The right ventricular size is normal. There is normal pulmonary artery systolic pressure.  3. Left atrial size was moderately dilated.  4. The mitral valve is normal in structure. Mild to moderate mitral valve regurgitation. No evidence of mitral stenosis.  5. The aortic valve is normal in structure. Aortic valve regurgitation is mild. Mild aortic valve sclerosis is present, with no evidence of aortic valve stenosis.  6. Aortic dilatation noted. There is mild dilatation of the ascending aorta, measuring 41 mm.  7. Only parasternal views. FINDINGS  Left Ventricle: Left ventricular ejection fraction, by estimation, is 55 to 60%. The left ventricle has normal function. The left ventricle has no regional wall motion abnormalities. The left ventricular internal cavity size was normal in size. There is  mild left ventricular hypertrophy. Left ventricular diastolic function could not be evaluated. Right Ventricle: The right ventricular size is normal. No increase in right ventricular wall thickness. Right ventricular systolic function is  normal. There is normal pulmonary artery systolic pressure. The tricuspid regurgitant velocity is 2.59 m/s, and  with an assumed right atrial pressure of 5 mmHg, the estimated right ventricular systolic pressure is 31.8 mmHg. Left Atrium: Left atrial size was moderately dilated. Right Atrium: Right atrial size was normal in size. Pericardium: There is no evidence of pericardial effusion. Mitral Valve: The mitral valve is normal in structure. Mild to moderate mitral valve regurgitation. No evidence of mitral valve stenosis. Tricuspid Valve: The tricuspid valve is normal in structure. Tricuspid valve regurgitation is mild . No evidence of tricuspid stenosis. Aortic Valve: The aortic valve is normal in structure. Aortic valve regurgitation is mild. Mild aortic valve sclerosis is present, with no evidence of aortic valve stenosis. Pulmonic Valve: The pulmonic valve was normal in structure. Pulmonic valve regurgitation is mild. No evidence of pulmonic stenosis. Aorta: Aortic dilatation noted. There is mild dilatation of the ascending aorta, measuring 41 mm. Venous: The inferior vena cava was not well visualized. IAS/Shunts: No atrial level shunt detected by color flow Doppler.  LEFT VENTRICLE PLAX 2D LVIDd:         3.53 cm LVIDs:         2.54 cm LV PW:         1.11 cm LV IVS:        1.27 cm LVOT diam:     2.10 cm LVOT Area:     3.46 cm  LEFT ATRIUM         Index LA diam:    4.40 cm 2.12 cm/m                        PULMONIC VALVE AORTA                 PV Vmax:        0.78 m/s Ao Root diam: 3.65 cm PV Peak grad:   2.4 mmHg  RVOT Peak grad: 2 mmHg  TRICUSPID VALVE TR Peak grad:   26.8 mmHg TR Vmax:        259.00 cm/s  SHUNTS Systemic Diam: 2.10 cm Lorine BearsMuhammad Mariah Gerstenberger MD Electronically signed by Lorine BearsMuhammad Tiera Mensinger MD Signature Date/Time: 12/29/2020/5:10:38 PM    Final     Cardiac Studies   Echocardiogram was done yesterday and was personally reviewed by me: 1. Left ventricular ejection fraction, by  estimation, is 55 to 60%. The  left ventricle has normal function. The left ventricle has no regional  wall motion abnormalities. There is mild left ventricular hypertrophy.  Left ventricular diastolic function  could not be evaluated.  2. Right ventricular systolic function is normal. The right ventricular  size is normal. There is normal pulmonary artery systolic pressure.  3. Left atrial size was moderately dilated.  4. The mitral valve is normal in structure. Mild to moderate mitral valve  regurgitation. No evidence of mitral stenosis.  5. The aortic valve is normal in structure. Aortic valve regurgitation is  mild. Mild aortic valve sclerosis is present, with no evidence of aortic  valve stenosis.  6. Aortic dilatation noted. There is mild dilatation of the ascending  aorta, measuring 41 mm.  7. Only parasternal views.   Patient Profile     70 y.o. female with no previous cardiac history.  She has history of hypertension, GERD, type 2 diabetes, sleep apnea and essential tremor who presented with abdominal pain and coffee-ground emesis and was found to have acute renal failure and A. fib with RVR  Assessment & Plan    1.  Newly diagnosed atrial fibrillation with RVR: Exact onset is not entirely clear but this is in the setting of acute renal failure due to what seems to be obstructive uropathy with pyelonephritis and suspected GI bleed. Diltiazem drip was resumed this morning due to A. fib with RVR. She was started on small dose metoprolol yesterday but has not responded very well.  I am going to switch her to oral diltiazem to see if we can take her off diltiazem drip. No anticoagulation is recommended at the present time due to suspected GI bleed as well as acute renal failure.  Once these issues improve, will start anticoagulation given chads vas score of 4.  2.  Mildly elevated troponin: Likely supply demand ischemia.  Echocardiogram showed normal LV systolic function and  wall motion.   Outpatient ischemic cardiac evaluation can be considered.  3.  Acute renal failure: Due to obstructive uropathy with pyelonephritis: Renal function is mildly improved compared to yesterday.    4.  Suspected GI bleed: Followed by gastroenterology.  Hemoglobin is stable.       For questions or updates, please contact [facility] Please consult [email or web address] for contact info under        Signed, Lorine BearsMuhammad Evelina Lore, MD  12/30/2020, 11:58 AM

## 2020-12-30 NOTE — Progress Notes (Signed)
Urology Consult Follow Up  Subjective: Patient states she is tired due to all the bells going off all hours of the day, but she denies any pain at this time.  She is having runs of unstained tachycardia this am.  Potassium found to be 2.7 this am, IV K is infusing.    Afebrile.  Serum creatinine is 6.31 this morning down from 7.31 yesterday, her WBC count is 12.9 up from 9.8 yesterday and she is having good urinary output with Foley catheter in place.  Foley catheter is draining clear yellow urine.  Blood cultures are negative and urine culture is still pending.   Anti-infectives: Anti-infectives (From admission, onward)   Start     Dose/Rate Route Frequency Ordered Stop   12/28/20 2000  ceFEPIme (MAXIPIME) 1 g in sodium chloride 0.9 % 100 mL IVPB        1 g 200 mL/hr over 30 Minutes Intravenous Daily 12/28/20 1826     12/28/20 1545  ceFAZolin (ANCEF) powder 2 g  Status:  Discontinued        2 g Other To Surgery 12/28/20 1457 12/28/20 1826      Current Facility-Administered Medications  Medication Dose Route Frequency Provider Last Rate Last Admin  . acetaminophen (TYLENOL) tablet 650 mg  650 mg Oral Q6H PRN Bjorn Pippin, MD   650 mg at 12/30/20 0216   Or  . acetaminophen (TYLENOL) suppository 650 mg  650 mg Rectal Q6H PRN Bjorn Pippin, MD      . atorvastatin (LIPITOR) tablet 40 mg  40 mg Oral QHS Bjorn Pippin, MD   40 mg at 12/29/20 2026  . brimonidine (ALPHAGAN) 0.2 % ophthalmic solution 1 drop  1 drop Both Eyes BID Bjorn Pippin, MD   1 drop at 12/29/20 2028   And  . timolol (TIMOPTIC) 0.5 % ophthalmic solution 1 drop  1 drop Both Eyes BID Bjorn Pippin, MD   1 drop at 12/29/20 2029  . ceFEPIme (MAXIPIME) 1 g in sodium chloride 0.9 % 100 mL IVPB  1 g Intravenous Q2000 Marrion Coy, MD   Stopped at 12/29/20 2053  . Chlorhexidine Gluconate Cloth 2 % PADS 6 each  6 each Topical Daily Marrion Coy, MD   6 each at 12/29/20 1305  . feeding supplement (BOOST / RESOURCE BREEZE) liquid 1  Container  1 Container Oral TID BM Bjorn Pippin, MD   1 Container at 12/29/20 1341  . insulin aspart (novoLOG) injection 0-20 Units  0-20 Units Subcutaneous Q4H Manuela Schwartz, NP   3 Units at 12/30/20 0448  . insulin glargine (LANTUS) injection 20 Units  20 Units Subcutaneous Daily Marrion Coy, MD   20 Units at 12/29/20 [phone]  . iron polysaccharides (NIFEREX) capsule 150 mg  150 mg Oral Daily Marrion Coy, MD   150 mg at 12/29/20 0842  . lactated ringers infusion   Intravenous Continuous Manuela Schwartz, NP 125 mL/hr at 12/30/20 0160 Infusion Verify at 12/30/20 1093  . latanoprost (XALATAN) 0.005 % ophthalmic solution 1 drop  1 drop Left Eye Randall Hiss, MD   1 drop at 12/29/20 2027  . metoprolol tartrate (LOPRESSOR) tablet 12.5 mg  12.5 mg Oral BID Lorine Bears A, MD   12.5 mg at 12/29/20 2024  . ondansetron (ZOFRAN) tablet 4 mg  4 mg Oral Q6H PRN Bjorn Pippin, MD       Or  . ondansetron [facility]) injection 4 mg  4 mg Intravenous Q6H PRN Bjorn Pippin, MD      .  pantoprazole (PROTONIX) 80 mg in sodium chloride 0.9 % 100 mL (0.8 mg/mL) infusion  8 mg/hr Intravenous Continuous Bjorn Pippin, MD 10 mL/hr at 12/30/20 0627 8 mg/hr at 12/30/20 0627  . [START ON 12/31/2020] pantoprazole (PROTONIX) injection 40 mg  40 mg Intravenous Q12H Bjorn Pippin, MD      . phosphorus (K PHOS NEUTRAL) tablet 500 mg  500 mg Oral BID Manuela Schwartz, NP      . potassium chloride 10 mEq in 100 mL IVPB  10 mEq Intravenous Q1 Hr x 4 Manuela Schwartz, NP 100 mL/hr at 12/30/20 4944 Infusion Verify at 12/30/20 9675  . traZODone (DESYREL) tablet 25 mg  25 mg Oral QHS PRN Bjorn Pippin, MD         Objective: Vital signs in last 24 hours: Temp:  [97.8 F (36.6 C)-98.3 F (36.8 C)] 97.9 F (36.6 C) (03/30 0700) Pulse Rate:  [76-142] 142 (03/30 0700) Resp:  [17-20] 20 (03/30 0700) BP: (100-124)/(64-77) 105/71 (03/30 0700) SpO2:  [92 %-98 %] 96 % (03/30 0700) Weight:  [96.3 kg] 96.3 kg (03/29 0754)  Intake/Output from  previous day: 03/29 0701 - 03/30 0700 In: 3481.9 [P.O.:480; I.V.:2425.9; IV Piggyback:576] Out: 1175 [Urine:1175] Intake/Output this shift: No intake/output data recorded.   Physical Exam Constitutional:  Well nourished. Alert and oriented, No acute distress. HEENT: [place] AT, moist mucus membranes.  Trachea midline.  Nasal cannula in place.  Small head tremors when patient lifts her head.   Cardiovascular: No clubbing, cyanosis, or edema. Respiratory: Normal respiratory effort, no increased work of breathing. GI: Abdomen is soft, non tender, non distended, no abdominal masses.   GU: No CVA tenderness.  No bladder fullness or masses.   Neurologic: Grossly intact, no focal deficits, moving all 4 extremities. Psychiatric: Normal mood and affect.   Lab Results:  Recent Labs    12/29/20 0456 12/30/20 0341  WBC 9.8 12.9*  HGB 10.8* 10.7*  HCT 32.0* 32.0*  PLT 196 242   BMET Recent Labs    12/29/20 0456 12/30/20 0341  NA 131* 138  K 3.3* 2.7*  CL 98 98  CO2 21* 27  GLUCOSE 543* 133*  BUN 87* 88*  CREATININE 7.31* 6.31*  CALCIUM 7.0* 6.8*   PT/INR Recent Labs    12/27/20 1841  LABPROT 14.3  INR 1.2   ABG Recent Labs    12/29/20 0456  HCO3 22.1    Studies/Results: CT ABDOMEN PELVIS WO CONTRAST  Result Date: 12/28/2020 CLINICAL DATA:  Flank pain, kidney stones suspected, right hydronephrosis by ultrasound EXAM: CT ABDOMEN AND PELVIS WITHOUT CONTRAST TECHNIQUE: Multidetector CT imaging of the abdomen and pelvis was performed following the standard protocol without IV contrast. COMPARISON:  Same day renal ultrasound, 12/28/2020 FINDINGS: Lower chest: Atelectasis and/or consolidation of the dependent right lung base. Hepatobiliary: No solid liver abnormality is seen. Hepatic steatosis. No gallstones, gallbladder wall thickening, or biliary dilatation. Pancreas: Unremarkable. No pancreatic ductal dilatation or surrounding inflammatory changes. Spleen: Normal in size without  significant abnormality. Adrenals/Urinary Tract: Adrenal glands are unremarkable. Moderate right hydronephrosis with an oblong calculus at the right ureteropelvic junction measuring 2.0 cm in length. Right-sided perinephric fat stranding. No additional calculi identified. Stomach/Bowel: Stomach is within normal limits. Appendix appears normal. No evidence of bowel wall thickening, distention, or inflammatory changes. Vascular/Lymphatic: Aortic atherosclerosis. No enlarged abdominal or pelvic lymph nodes. Reproductive: Status post hysterectomy. Other: No abdominal wall hernia or abnormality. No abdominopelvic ascites. Musculoskeletal: No acute or significant osseous findings. IMPRESSION: 1. Moderate right  hydronephrosis with an oblong calculus at the right ureteropelvic junction measuring 2.0 cm in length. Right-sided perinephric fat stranding. No additional calculi identified. 2. Hepatic steatosis. Aortic Atherosclerosis (ICD10-I70.0). Electronically Signed   By: Lauralyn PrimesAlex  Bibbey M.D.   On: 12/28/2020 12:49   DG OR UROLOGY CYSTO IMAGE ([facility] ONLY)  Result Date: 12/28/2020 There is no interpretation for this exam.  This order is for images obtained during a surgical procedure.  Please See "Surgeries" Tab for more information regarding the procedure.   ECHOCARDIOGRAM COMPLETE  Result Date: 12/29/2020    ECHOCARDIOGRAM REPORT   Patient Name:   Cynthia KeySHARON Lucas Date of Exam: 12/29/2020 Medical Rec #:  [id number]     Height:       67.0 in Accession #:    [id number]    Weight:       212.4 lb Date of Birth:  21-Dec-1950     BSA:          2.075 m Patient Age:    69 years      BP:           110/75 mmHg Patient Gender: F             HR:           81 bpm. Exam Location:  [facility] Procedure: 2D Echo, Cardiac Doppler and Color Doppler Indications:     Atrial Fibrillation I48.91  History:         Patient has no prior history of Echocardiogram examinations.                  Risk Factors:Hypertension and Diabetes.  Sonographer:     Cristela BlueJerry  Hege RDCS (AE) Referring Phys:  [id number] Bjorn PippinJOHN WRENN Diagnosing Phys: Lorine BearsMuhammad Arida MD  Sonographer Comments: No apical window, no subcostal window and Technically challenging study due to limited acoustic windows. IMPRESSIONS  1. Left ventricular ejection fraction, by estimation, is 55 to 60%. The left ventricle has normal function. The left ventricle has no regional wall motion abnormalities. There is mild left ventricular hypertrophy. Left ventricular diastolic function could not be evaluated.  2. Right ventricular systolic function is normal. The right ventricular size is normal. There is normal pulmonary artery systolic pressure.  3. Left atrial size was moderately dilated.  4. The mitral valve is normal in structure. Mild to moderate mitral valve regurgitation. No evidence of mitral stenosis.  5. The aortic valve is normal in structure. Aortic valve regurgitation is mild. Mild aortic valve sclerosis is present, with no evidence of aortic valve stenosis.  6. Aortic dilatation noted. There is mild dilatation of the ascending aorta, measuring 41 mm.  7. Only parasternal views. FINDINGS  Left Ventricle: Left ventricular ejection fraction, by estimation, is 55 to 60%. The left ventricle has normal function. The left ventricle has no regional wall motion abnormalities. The left ventricular internal cavity size was normal in size. There is  mild left ventricular hypertrophy. Left ventricular diastolic function could not be evaluated. Right Ventricle: The right ventricular size is normal. No increase in right ventricular wall thickness. Right ventricular systolic function is normal. There is normal pulmonary artery systolic pressure. The tricuspid regurgitant velocity is 2.59 m/s, and  with an assumed right atrial pressure of 5 mmHg, the estimated right ventricular systolic pressure is 31.8 mmHg. Left Atrium: Left atrial size was moderately dilated. Right Atrium: Right atrial size was normal in size. Pericardium: There is  no evidence of pericardial effusion. Mitral Valve: The mitral valve is normal  in structure. Mild to moderate mitral valve regurgitation. No evidence of mitral valve stenosis. Tricuspid Valve: The tricuspid valve is normal in structure. Tricuspid valve regurgitation is mild . No evidence of tricuspid stenosis. Aortic Valve: The aortic valve is normal in structure. Aortic valve regurgitation is mild. Mild aortic valve sclerosis is present, with no evidence of aortic valve stenosis. Pulmonic Valve: The pulmonic valve was normal in structure. Pulmonic valve regurgitation is mild. No evidence of pulmonic stenosis. Aorta: Aortic dilatation noted. There is mild dilatation of the ascending aorta, measuring 41 mm. Venous: The inferior vena cava was not well visualized. IAS/Shunts: No atrial level shunt detected by color flow Doppler.  LEFT VENTRICLE PLAX 2D LVIDd:         3.53 cm LVIDs:         2.54 cm LV PW:         1.11 cm LV IVS:        1.27 cm LVOT diam:     2.10 cm LVOT Area:     3.46 cm  LEFT ATRIUM         Index LA diam:    4.40 cm 2.12 cm/m                        PULMONIC VALVE AORTA                 PV Vmax:        0.78 m/s Ao Root diam: 3.65 cm PV Peak grad:   2.4 mmHg                       RVOT Peak grad: 2 mmHg  TRICUSPID VALVE TR Peak grad:   26.8 mmHg TR Vmax:        259.00 cm/s  SHUNTS Systemic Diam: 2.10 cm Lorine Bears MD Electronically signed by Lorine Bears MD Signature Date/Time: 12/29/2020/5:10:38 PM    Final      Assessment and Plan: 70 year old female who is status post right ureteral stent placement with Dr. Annabell Howells on December 28, 2020 for a 2 cm right UPJ stone with pyelonephrosis and sepsis with intraoperative findings of chronic follicular cystitis.  Creatinine is down this am and plans are to continue the Foley catheter for maximum urinary decompression until there is a sustained downward trend of her creatinine.    Leukocytosis this morning may still be the result of stone manipulation.   Urine cultures are not yet available.  Recommendations: -Continue broad-spectrum antibiotics and supportive care and follow cultures as she will require 14 days of culture appropriate therapy -Continue to trend WBC -Continue Foley catheter until creatinine has sustained downward trend -Continue to trend creatinine -Outpatient URS/LL/stent exchange with Dr. Lonna Cobb in 2 to 3 weeks         LOS: 3 days    [facility] 12/30/2020

## 2020-12-30 NOTE — Progress Notes (Signed)
HR anywhere from 80s- 160s. High rate does not sustain. K is 2.7 this morning. given PO, IV K infusing currently. Phosphorus ordered for the morning. Cliffton Asters, NP aware of HR. Plan is to replace K and see if that helps with the abnormal rhythm. patient resting in bed, tylenol given for stomach pain earlier that has since resolved. Patient able to make her needs known. Call bell within reach, will continue with the current plan of care.

## 2020-12-30 NOTE — Care Management Important Message (Signed)
Important Message  Patient Details  Name: Cynthia Lucas MRN: [id number] Date of Birth: 02/28/1951   Medicare Important Message Given:  Yes     [name] 12/30/2020, 11:23 AM

## 2020-12-30 NOTE — Progress Notes (Signed)
PROGRESS NOTE    Cynthia Lucas  CBS:[id number] DOB: Apr 12, 1951 DOA: 12/27/2020 PCP: Cynthia Cookey, FNP   Follow-up pyelonephritis. Brief Narrative:  Cynthia Lucas a 70 y.o.African-American female femalewith medical history significant forhypertension, GERD, type 2 diabetes mellitus and sleep apnea as well as head tremors, who presented to the emergency room with acute onset of recurrent nausea and vomiting with diarrhea. She admits to coffee-ground emesis and denied any bright red bleeding per rectum or melena.  In the emergency room, she was found to have atrial fibrillation with rapid ventricular response, she also had acute renal failure with creatinine 6.56.  She is placed on IV fluids, consult from cardiology and GI was obtained from the emergency room. 3/28.  Patient ultrasound showed moderate hydronephrosis.  Ureteral stent was placed by urology, during the process, patient had pus drained from the kidney, consistent with acute pyelonephritis.  Cefepime was started.   Assessment & Plan:   Active Problems:   GI bleeding   Acute renal failure syndrome (HCC)   Metabolic acidosis   Hypokalemia   Atrial fibrillation with rapid ventricular response (HCC)   Uncontrolled type 2 diabetes mellitus with hyperglycemia, without long-term current use of insulin (HCC)   Sepsis (HCC)   Acute pyelonephritis   Hydronephrosis of right kidney   Kidney stone on right side  #1. Severe sepsis secondary to acute pyelonephritis. --on presentation, had severe tachycardia, tachypnea and leukocytosis.  Patient also had acute renal failure. --cont cefepime pending urine cx result  # obstructing right sided kidney stone causing acute pyelonephritis and  Right hydronephrosis  S/p right stent placement --cont MIVF for post-obstructive diuresis -Outpatient URS/LL/stent exchange with Dr. Lonna Lucas in 2 to 3 weeks  2.  Acute renal failure on chronic kidney disease stage IIIa. Non anion gap metabolic  acidosis. Patient renal failure is secondary to obstruction and sepsis. Patient is followed by nephrology --cont LR@125   Hypokalemia --replete with oral potassium  Hypomag --replete with IV mag  Hyponatremia, resolved  #3.  Uncontrolled type 2 diabetes with hyperglycemia. Continue Lantus, scheduled insulin and sliding scale insulin.  Adjust dose as needed.  4.  Coffee-ground emesis. Iron deficient anemia.  Borderline B12 level. Patient hemoglobin still stable at 10.8, S/p IV iron and Vit B12 injection Patient has been seen by GI, and work-up when patient medical condition is more stable. --cont PPI gtt  5.  Acute metabolic encephalopathy. Secondary to sepsis, improved  #6. paroxysmal atrial fibrillation with rapid ventricular response. --s/p dilt gtt --started on low-dose metop by cardiology --pt went into RVR again this morning Plan: --resume dilt gtt, per cardiology Not able to anticoagulate due to GI bleed.  Acute abdominal pain, new --onset this morning.  KUB remarkable, pt having normal BM's.  Discussed with urology, could be cramps from stent. --start Flomax, per urology rec   DVT prophylaxis: SCDs Code Status: Full Family Communication: Cynthia Lucas updated at bedside today Disposition Plan:  .   Status is: Inpatient  Remains inpatient appropriate because:Inpatient level of care appropriate due to severity of illness   Dispo: The patient is from: Home              Anticipated d/c is to: Home              Patient currently is not medically stable to d/c.  Cr is still in 6's, on IVF.    Difficult to place patient No    I/O last 3 completed shifts: In: 6496.1 [P.O.:480; I.V.:4175.3; IV Piggyback:1840.8] Out: 1375 [  Urine:1375] Total I/O In: 120 [P.O.:120] Out: 750 [Urine:750]     Consultants:   Urology, nephrology  Procedures: Ureteral stent.  Antimicrobials:  Cefepime.  Subjective: Pt had new onset pain in the abdomen this morning, described as  crampy.  Pt had multiple BM's, KUB unremarkable.  Good urine output.  No dyspnea.     Objective: Vitals:   12/30/20 0751 12/30/20 1011 12/30/20 1136 12/30/20 1617  BP: 102/81 115/68 102/81 96/75  Pulse: (!) 121 (!) 113 92 79  Resp: 20  14 (!) 22  Temp: 98 F (36.7 C)  98.1 F (36.7 C)   TempSrc: Oral  Oral   SpO2: 96%  96% 97%  Weight:      Height:        Intake/Output Summary (Last 24 hours) at 12/30/2020 1709 Last data filed at 12/30/2020 1140 Gross per 24 hour  Intake 3361.91 ml  Output 1925 ml  Net 1436.91 ml   Filed Weights   12/28/20 0900 12/29/20 0400 12/29/20 0754  Weight: 91.6 kg 94.7 kg 96.3 kg    Examination:  Constitutional: NAD, AAOx3 HEENT: conjunctivae and lids normal, EOMI CV: No cyanosis.   RESP: normal respiratory effort, on RA GI: abdomen tender to palpation Extremities: No effusions, edema in BLE SKIN: warm, dry Neuro: II - XII grossly intact.   Psych: Normal mood and affect.  Appropriate judgement and reason Foley present   Data Reviewed: I have personally reviewed following labs and imaging studies  CBC: Recent Labs  Lab 12/27/20 1841 12/28/20 0502 12/29/20 0456 12/30/20 0341  WBC 12.3* 11.7* 9.8 12.9*  NEUTROABS 10.9*  --  8.7* 10.6*  HGB 13.7 11.5* 10.8* 10.7*  HCT 41.5 34.3* 32.0* 32.0*  MCV 79.2* 78.1* 78.4* 76.9*  PLT 237 216 196 242   Basic Metabolic Panel: Recent Labs  Lab 12/27/20 1841 12/27/20 2039 12/28/20 0502 12/28/20 1504 12/29/20 0456 12/30/20 0341 12/30/20 1503  NA 137  --  136  --  131* 138  --   K 2.9*  --  3.0* 3.1* 3.3* 2.7* 3.0*  CL 100  --  105  --  98 98  --   CO2 21*  --  16*  --  21* 27  --   GLUCOSE 441*  --  295*  --  543* 133*  --   BUN 73*  --  80*  --  87* 88*  --   CREATININE 6.56*  --  6.81*  --  7.31* 6.31*  --   CALCIUM 8.3*  --  7.9*  --  7.0* 6.8*  --   MG 2.2 2.1  --  1.9  --  1.6*  --   PHOS  --   --   --   --  4.5  4.5 2.5  --    GFR: Estimated Creatinine Clearance: 10 mL/min  (A) (by C-G formula based on SCr of 6.31 mg/dL (H)). Liver Function Tests: Recent Labs  Lab 12/27/20 1841 12/29/20 0456 12/30/20 0341  AST 38 25 23  ALT 21 20 14   ALKPHOS 58 55 53  BILITOT 0.7 0.5 0.6  PROT 7.9 6.0* 5.8*  ALBUMIN 3.3* 2.4* 2.2*   No results for input(s): LIPASE, AMYLASE in the last 168 hours. No results for input(s): AMMONIA in the last 168 hours. Coagulation Profile: Recent Labs  Lab 12/27/20 1841  INR 1.2   Cardiac Enzymes: No results for input(s): CKTOTAL, CKMB, CKMBINDEX, TROPONINI in the last 168 hours. BNP (last 3 results) No  results for input(s): PROBNP in the last 8760 hours. HbA1C: Recent Labs    12/28/20 1039  HGBA1C 7.9*   CBG: Recent Labs  Lab 12/30/20 0415 12/30/20 0424 12/30/20 0749 12/30/20 1136 12/30/20 1617  GLUCAP 153* 137* 131* 175* 161*   Lipid Profile: No results for input(s): CHOL, HDL, LDLCALC, TRIG, CHOLHDL, LDLDIRECT in the last 72 hours. Thyroid Function Tests: Recent Labs    12/27/20 1841  TSH 1.858  FREET4 0.92   Anemia Panel: Recent Labs    12/28/20 1045 12/29/20 0456  VITAMINB12 239  --   FOLATE 8.6  --   TIBC 192* 181*  IRON 13* 20*   Sepsis Labs: Recent Labs  Lab 12/28/20 1824 12/28/20 1957  LATICACIDVEN 1.3 1.1    Recent Results (from the past 240 hour(s))  Resp Panel by RT-PCR (Flu A&B, Covid) Nasopharyngeal Swab     Status: None   Collection Time: 12/27/20  6:48 PM   Specimen: Nasopharyngeal Swab; Nasopharyngeal(NP) swabs in vial transport medium  Result Value Ref Range Status   SARS Coronavirus 2 by RT PCR NEGATIVE NEGATIVE Final    Comment: (NOTE) SARS-CoV-2 target nucleic acids are NOT DETECTED.  The SARS-CoV-2 RNA is generally detectable in upper respiratory specimens during the acute phase of infection. The lowest concentration of SARS-CoV-2 viral copies this assay can detect is 138 copies/mL. A negative result does not preclude SARS-Cov-2 infection and should not be used as the  sole basis for treatment or other patient management decisions. A negative result may occur with  improper specimen collection/handling, submission of specimen other than nasopharyngeal swab, presence of viral mutation(s) within the areas targeted by this assay, and inadequate number of viral copies(<138 copies/mL). A negative result must be combined with clinical observations, patient history, and epidemiological information. The expected result is Negative.  Fact Sheet for Patients:  BloggerCourse.com  Fact Sheet for Healthcare Providers:  SeriousBroker.it  This test is no t yet approved or cleared by the [place] FDA and  has been authorized for detection and/or diagnosis of SARS-CoV-2 by FDA under an Emergency Use Authorization (EUA). This EUA will remain  in effect (meaning this test can be used) for the duration of the COVID-19 declaration under Section 564(b)(1) of the Act, 21 U.S.C.section 360bbb-3(b)(1), unless the authorization is terminated  or revoked sooner.       Influenza A by PCR NEGATIVE NEGATIVE Final   Influenza B by PCR NEGATIVE NEGATIVE Final    Comment: (NOTE) The Xpert Xpress SARS-CoV-2/FLU/RSV plus assay is intended as an aid in the diagnosis of influenza from Nasopharyngeal swab specimens and should not be used as a sole basis for treatment. Nasal washings and aspirates are unacceptable for Xpert Xpress SARS-CoV-2/FLU/RSV testing.  Fact Sheet for Patients: BloggerCourse.com  Fact Sheet for Healthcare Providers: SeriousBroker.it  This test is not yet approved or cleared by the [place] FDA and has been authorized for detection and/or diagnosis of SARS-CoV-2 by FDA under an Emergency Use Authorization (EUA). This EUA will remain in effect (meaning this test can be used) for the duration of the COVID-19 declaration under Section 564(b)(1) of the  Act, 21 U.S.C. section 360bbb-3(b)(1), unless the authorization is terminated or revoked.  Performed at [facility], [address] [place], [place] [address]   Gastrointestinal Panel by PCR , Stool     Status: None   Collection Time: 12/27/20  8:39 PM   Specimen: Stool  Result Value Ref Range Status   Campylobacter species NOT  DETECTED NOT DETECTED Final   Plesimonas shigelloides NOT DETECTED NOT DETECTED Final   Salmonella species NOT DETECTED NOT DETECTED Final   Yersinia enterocolitica NOT DETECTED NOT DETECTED Final   Vibrio species NOT DETECTED NOT DETECTED Final   Vibrio cholerae NOT DETECTED NOT DETECTED Final   Enteroaggregative E coli (EAEC) NOT DETECTED NOT DETECTED Final   Enteropathogenic E coli (EPEC) NOT DETECTED NOT DETECTED Final   Enterotoxigenic E coli (ETEC) NOT DETECTED NOT DETECTED Final   Shiga like toxin producing E coli (STEC) NOT DETECTED NOT DETECTED Final   Shigella/Enteroinvasive E coli (EIEC) NOT DETECTED NOT DETECTED Final   Cryptosporidium NOT DETECTED NOT DETECTED Final   Cyclospora cayetanensis NOT DETECTED NOT DETECTED Final   Entamoeba histolytica NOT DETECTED NOT DETECTED Final   Giardia lamblia NOT DETECTED NOT DETECTED Final   Adenovirus F40/41 NOT DETECTED NOT DETECTED Final   Astrovirus NOT DETECTED NOT DETECTED Final   Norovirus GI/GII NOT DETECTED NOT DETECTED Final   Rotavirus A NOT DETECTED NOT DETECTED Final   Sapovirus (I, II, IV, and V) NOT DETECTED NOT DETECTED Final    Comment: Performed at [facility], [address] [place], [place] [address]  C Difficile Quick Screen w PCR reflex     Status: None   Collection Time: 12/27/20  8:39 PM   Specimen: Stool  Result Value Ref Range Status   C Diff antigen NEGATIVE NEGATIVE Final   C Diff toxin NEGATIVE NEGATIVE Final   C Diff interpretation No C. difficile detected.  Final    Comment: Performed at [facility], [address] [place], [place]  [address]  Urine Culture     Status: Abnormal (Preliminary result)   Collection Time: 12/28/20  6:02 PM   Specimen: PATH Cytology Urine  Result Value Ref Range Status   Specimen Description   Final    URINE, RANDOM Performed at [facility], [address] [place], [place] [address]    Special Requests   Final    CYTO URINE Performed at [facility], [address] [place], [place] [address]    Culture (A)  Final    >=100,000 COLONIES/mL ESCHERICHIA COLI SUSCEPTIBILITIES TO FOLLOW Performed at [facility], [address] [place], [place] [address]    Report Status PENDING  Incomplete  Urine culture     Status: Abnormal (Preliminary result)   Collection Time: 12/28/20  6:10 PM   Specimen: Urine, Random  Result Value Ref Range Status   Specimen Description   Final    URINE, RANDOM Performed at [facility], [address], [place], [place] [address]    Special Requests   Final    RIGHT RENAL PELVIS URINE CULTURE Performed at [facility], [address] [place], [place] [address]    Culture (A)  Final    >=100,000 COLONIES/mL ESCHERICHIA COLI SUSCEPTIBILITIES TO FOLLOW Performed at [facility], [address] [place], [place] [address]    Report Status PENDING  Incomplete  CULTURE, BLOOD (ROUTINE X 2) w Reflex to ID Panel     Status: None (Preliminary result)   Collection Time: 12/28/20  7:10 PM   Specimen: BLOOD  Result Value Ref Range Status   Specimen Description BLOOD BLOOD RIGHT HAND  Final   Special Requests   Final    BOTTLES DRAWN AEROBIC AND ANAEROBIC Blood Culture adequate volume   Culture   Final    NO GROWTH 2 DAYS Performed  at [facility], [address] [place], [place] [address]    Report Status PENDING  Incomplete  CULTURE, BLOOD (ROUTINE X 2) w Reflex to ID Panel     Status: None (Preliminary result)   Collection Time: 12/28/20  7:46 PM   Specimen: BLOOD  Result Value Ref Range Status    Specimen Description BLOOD BLOOD LEFT HAND  Final   Special Requests   Final    BOTTLES DRAWN AEROBIC AND ANAEROBIC Blood Culture adequate volume   Culture   Final    NO GROWTH 2 DAYS Performed at [facility], [address], [place], [place] [address]    Report Status PENDING  Incomplete         Radiology Studies: DG Abd 1 View  Result Date: 12/30/2020 CLINICAL DATA:  Abdominal pain with nausea and vomiting EXAM: ABDOMEN - 1 VIEW COMPARISON:  January 22, 2019 abdominal radiograph; CT abdomen and pelvis December 28, 2020 FINDINGS: The previously noted calculus at the right ureteropelvic junction is no longer evident. There is a double-J stent extending from the expected location of the right renal pelvis to the bladder. There is mild stool volume in the colon. There is no bowel dilatation or air-fluid level to suggest bowel obstruction. No free air. Visualized lung bases are clear. IMPRESSION: Double-J stent now present on the right. Previously noted calculus at the right ureteropelvic junction not seen currently. No bowel obstruction or free air.  Visualized lung bases clear. Electronically Signed   By: Bretta Bang III M.D.   On: 12/30/2020 14:35   ECHOCARDIOGRAM COMPLETE  Result Date: 12/29/2020    ECHOCARDIOGRAM REPORT   Patient Name:   Cynthia Lucas Date of Exam: 12/29/2020 Medical Rec #:  [id number]     Height:       67.0 in Accession #:    [id number]    Weight:       212.4 lb Date of Birth:  1951/06/01     BSA:          2.075 m Patient Age:    69 years      BP:           110/75 mmHg Patient Gender: F             HR:           81 bpm. Exam Location:  [facility] Procedure: 2D Echo, Cardiac Doppler and Color Doppler Indications:     Atrial Fibrillation I48.91  History:         Patient has no prior history of Echocardiogram examinations.                  Risk Factors:Hypertension and Diabetes.  Sonographer:     Cristela Blue RDCS (AE) Referring Phys:  [id number] Bjorn Pippin Diagnosing Phys:  Lorine Bears MD  Sonographer Comments: No apical window, no subcostal window and Technically challenging study due to limited acoustic windows. IMPRESSIONS  1. Left ventricular ejection fraction, by estimation, is 55 to 60%. The left ventricle has normal function. The left ventricle has no regional wall motion abnormalities. There is mild left ventricular hypertrophy. Left ventricular diastolic function could not be evaluated.  2. Right ventricular systolic function is normal. The right ventricular size is normal. There is normal pulmonary artery systolic pressure.  3. Left atrial size was moderately dilated.  4. The mitral valve is normal in structure. Mild to moderate mitral valve regurgitation. No evidence of mitral stenosis.  5. The aortic valve  is normal in structure. Aortic valve regurgitation is mild. Mild aortic valve sclerosis is present, with no evidence of aortic valve stenosis.  6. Aortic dilatation noted. There is mild dilatation of the ascending aorta, measuring 41 mm.  7. Only parasternal views. FINDINGS  Left Ventricle: Left ventricular ejection fraction, by estimation, is 55 to 60%. The left ventricle has normal function. The left ventricle has no regional wall motion abnormalities. The left ventricular internal cavity size was normal in size. There is  mild left ventricular hypertrophy. Left ventricular diastolic function could not be evaluated. Right Ventricle: The right ventricular size is normal. No increase in right ventricular wall thickness. Right ventricular systolic function is normal. There is normal pulmonary artery systolic pressure. The tricuspid regurgitant velocity is 2.59 m/s, and  with an assumed right atrial pressure of 5 mmHg, the estimated right ventricular systolic pressure is 31.8 mmHg. Left Atrium: Left atrial size was moderately dilated. Right Atrium: Right atrial size was normal in size. Pericardium: There is no evidence of pericardial effusion. Mitral Valve: The mitral  valve is normal in structure. Mild to moderate mitral valve regurgitation. No evidence of mitral valve stenosis. Tricuspid Valve: The tricuspid valve is normal in structure. Tricuspid valve regurgitation is mild . No evidence of tricuspid stenosis. Aortic Valve: The aortic valve is normal in structure. Aortic valve regurgitation is mild. Mild aortic valve sclerosis is present, with no evidence of aortic valve stenosis. Pulmonic Valve: The pulmonic valve was normal in structure. Pulmonic valve regurgitation is mild. No evidence of pulmonic stenosis. Aorta: Aortic dilatation noted. There is mild dilatation of the ascending aorta, measuring 41 mm. Venous: The inferior vena cava was not well visualized. IAS/Shunts: No atrial level shunt detected by color flow Doppler.  LEFT VENTRICLE PLAX 2D LVIDd:         3.53 cm LVIDs:         2.54 cm LV PW:         1.11 cm LV IVS:        1.27 cm LVOT diam:     2.10 cm LVOT Area:     3.46 cm  LEFT ATRIUM         Index LA diam:    4.40 cm 2.12 cm/m                        PULMONIC VALVE AORTA                 PV Vmax:        0.78 m/s Ao Root diam: 3.65 cm PV Peak grad:   2.4 mmHg                       RVOT Peak grad: 2 mmHg  TRICUSPID VALVE TR Peak grad:   26.8 mmHg TR Vmax:        259.00 cm/s  SHUNTS Systemic Diam: 2.10 cm Lorine Bears MD Electronically signed by Lorine Bears MD Signature Date/Time: 12/29/2020/5:10:38 PM    Final         Scheduled Meds: . atorvastatin  40 mg Oral QHS  . brimonidine  1 drop Both Eyes BID   And  . timolol  1 drop Both Eyes BID  . Chlorhexidine Gluconate Cloth  6 each Topical Daily  . diltiazem  30 mg Oral Q6H  . feeding supplement  1 Container Oral TID BM  . HYDROcodone-acetaminophen  1 tablet Oral Once  . insulin aspart  0-20 Units Subcutaneous Q4H  . insulin glargine  20 Units Subcutaneous Daily  . iron polysaccharides  150 mg Oral Daily  . latanoprost  1 drop Left Eye QHS  . magnesium oxide  400 mg Oral Daily  . [START ON  12/31/2020] pantoprazole  40 mg Intravenous Q12H  . phosphorus  500 mg Oral BID   Continuous Infusions: . ceFEPime (MAXIPIME) IV Stopped (12/29/20 2053)  . diltiazem (CARDIZEM) infusion 5 mg/hr (12/30/20 1004)  . lactated ringers 125 mL/hr at 12/30/20 4098  . magnesium sulfate bolus IVPB    . pantoprozole (PROTONIX) infusion 8 mg/hr (12/30/20 0627)     LOS: 3 days     Darlin Priestly, MD [facility]   To contact the attending provider between 7A-7P or the covering provider during after hours 7P-7A, please log into the web site [email or web address] and access using universal [place] password for that web site. If you do not have the password, please call the hospital operator.  12/30/2020, 5:09 PM

## 2020-12-30 NOTE — Progress Notes (Signed)
Mobility Specialist - Progress Note   12/30/20 1100  Mobility  Activity Contraindicated/medical hold  Mobility performed by Mobility specialist    Per chart review, pt with low K+ of 2.7 this date, sitting outside safety guidelines for mobility at this time. Will continue to monitor and attempt session when medically appropriate.    Filiberto Pinks Mobility Specialist 12/30/20, 11:31 AM

## 2020-12-30 NOTE — Plan of Care (Signed)

## 2020-12-30 NOTE — Progress Notes (Signed)
[facility]  ROUNDING NOTE   Subjective:   Cynthia Lucas is a 70 y.o. female with past medical history of chronic cough, diabetes mellitus, GERD, hypertension and sleep apnea. She presents to the ED with complaints of weakness and tachycardia.   She is admitted for AKI   Patient is seen resting in bed Alert and oriented Tolerating meals Denies nausea Denies shortness of breath   Foley-1.1L IVF LR 125 ml/hr Protonix 8mg /hr Diltiazem 5 mg/hr  Objective:  Vital signs in last 24 hours:  Temp:  [97.8 F (36.6 C)-98.3 F (36.8 C)] 98 F (36.7 C) (03/30 0751) Pulse Rate:  [76-142] 121 (03/30 0751) Resp:  [17-20] 20 (03/30 0751) BP: (100-124)/(64-81) 102/81 (03/30 0751) SpO2:  [92 %-97 %] 96 % (03/30 0751)  Weight change: 4.744 kg Filed Weights   12/28/20 0900 12/29/20 0400 12/29/20 0754  Weight: 91.6 kg 94.7 kg 96.3 kg    Intake/Output: I/O last 3 completed shifts: In: 6496.1 [P.O.:480; I.V.:4175.3; IV Piggyback:1840.8] Out: 1375 [Urine:1375]   Intake/Output this shift:  No intake/output data recorded.  Physical Exam: General: NAD,   Head: Normocephalic, atraumatic. Moist oral mucosal membranes  Eyes: Anicteric, PERRL  Neck: Supple, trachea midline  Lungs:  Clear to auscultation  Heart: Regular rate and rhythm  Abdomen:  Soft, nontender,   Extremities:  min peripheral edema.  Neurologic: Nonfocal, moving all four extremities  Skin: No lesions       Basic Metabolic Panel: Recent Labs  Lab 12/27/20 1841 12/27/20 2039 12/28/20 0502 12/28/20 1504 12/29/20 0456 12/30/20 0341  NA 137  --  136  --  131* 138  K 2.9*  --  3.0* 3.1* 3.3* 2.7*  CL 100  --  105  --  98 98  CO2 21*  --  16*  --  21* 27  GLUCOSE 441*  --  295*  --  543* 133*  BUN 73*  --  80*  --  87* 88*  CREATININE 6.56*  --  6.81*  --  7.31* 6.31*  CALCIUM 8.3*  --  7.9*  --  7.0* 6.8*  MG 2.2 2.1  --  1.9  --   --   PHOS  --   --   --   --  4.5  4.5 2.5    Liver Function  Tests: Recent Labs  Lab 12/27/20 1841 12/29/20 0456 12/30/20 0341  AST 38 25 23  ALT 21 20 14   ALKPHOS 58 55 53  BILITOT 0.7 0.5 0.6  PROT 7.9 6.0* 5.8*  ALBUMIN 3.3* 2.4* 2.2*   No results for input(s): LIPASE, AMYLASE in the last 168 hours. No results for input(s): AMMONIA in the last 168 hours.  CBC: Recent Labs  Lab 12/27/20 1841 12/28/20 0502 12/29/20 0456 12/30/20 0341  WBC 12.3* 11.7* 9.8 12.9*  NEUTROABS 10.9*  --  8.7* 10.6*  HGB 13.7 11.5* 10.8* 10.7*  HCT 41.5 34.3* 32.0* 32.0*  MCV 79.2* 78.1* 78.4* 76.9*  PLT 237 216 196 242    Cardiac Enzymes: No results for input(s): CKTOTAL, CKMB, CKMBINDEX, TROPONINI in the last 168 hours.  BNP: Invalid input(s): POCBNP  CBG: Recent Labs  Lab 12/29/20 2102 12/30/20 0005 12/30/20 0415 12/30/20 0424 12/30/20 0749  GLUCAP 170* 103* 153* 137* 131*    Microbiology: Results for orders placed or performed during the hospital encounter of 12/27/20  Resp Panel by RT-PCR (Flu A&B, Covid) Nasopharyngeal Swab     Status: None   Collection Time: 12/27/20  6:48  PM   Specimen: Nasopharyngeal Swab; Nasopharyngeal(NP) swabs in vial transport medium  Result Value Ref Range Status   SARS Coronavirus 2 by RT PCR NEGATIVE NEGATIVE Final    Comment: (NOTE) SARS-CoV-2 target nucleic acids are NOT DETECTED.  The SARS-CoV-2 RNA is generally detectable in upper respiratory specimens during the acute phase of infection. The lowest concentration of SARS-CoV-2 viral copies this assay can detect is 138 copies/mL. A negative result does not preclude SARS-Cov-2 infection and should not be used as the sole basis for treatment or other patient management decisions. A negative result may occur with  improper specimen collection/handling, submission of specimen other than nasopharyngeal swab, presence of viral mutation(s) within the areas targeted by this assay, and inadequate number of viral copies(<138 copies/mL). A negative result  must be combined with clinical observations, patient history, and epidemiological information. The expected result is Negative.  Fact Sheet for Patients:  BloggerCourse.com  Fact Sheet for Healthcare Providers:  SeriousBroker.it  This test is no t yet approved or cleared by the [place] FDA and  has been authorized for detection and/or diagnosis of SARS-CoV-2 by FDA under an Emergency Use Authorization (EUA). This EUA will remain  in effect (meaning this test can be used) for the duration of the COVID-19 declaration under Section 564(b)(1) of the Act, 21 U.S.C.section 360bbb-3(b)(1), unless the authorization is terminated  or revoked sooner.       Influenza A by PCR NEGATIVE NEGATIVE Final   Influenza B by PCR NEGATIVE NEGATIVE Final    Comment: (NOTE) The Xpert Xpress SARS-CoV-2/FLU/RSV plus assay is intended as an aid in the diagnosis of influenza from Nasopharyngeal swab specimens and should not be used as a sole basis for treatment. Nasal washings and aspirates are unacceptable for Xpert Xpress SARS-CoV-2/FLU/RSV testing.  Fact Sheet for Patients: BloggerCourse.com  Fact Sheet for Healthcare Providers: SeriousBroker.it  This test is not yet approved or cleared by the [place] FDA and has been authorized for detection and/or diagnosis of SARS-CoV-2 by FDA under an Emergency Use Authorization (EUA). This EUA will remain in effect (meaning this test can be used) for the duration of the COVID-19 declaration under Section 564(b)(1) of the Act, 21 U.S.C. section 360bbb-3(b)(1), unless the authorization is terminated or revoked.  Performed at [facility], [address] [place], [place] [address]   Gastrointestinal Panel by PCR , Stool     Status: None   Collection Time: 12/27/20  8:39 PM   Specimen: Stool  Result Value Ref Range Status   Campylobacter  species NOT DETECTED NOT DETECTED Final   Plesimonas shigelloides NOT DETECTED NOT DETECTED Final   Salmonella species NOT DETECTED NOT DETECTED Final   Yersinia enterocolitica NOT DETECTED NOT DETECTED Final   Vibrio species NOT DETECTED NOT DETECTED Final   Vibrio cholerae NOT DETECTED NOT DETECTED Final   Enteroaggregative E coli (EAEC) NOT DETECTED NOT DETECTED Final   Enteropathogenic E coli (EPEC) NOT DETECTED NOT DETECTED Final   Enterotoxigenic E coli (ETEC) NOT DETECTED NOT DETECTED Final   Shiga like toxin producing E coli (STEC) NOT DETECTED NOT DETECTED Final   Shigella/Enteroinvasive E coli (EIEC) NOT DETECTED NOT DETECTED Final   Cryptosporidium NOT DETECTED NOT DETECTED Final   Cyclospora cayetanensis NOT DETECTED NOT DETECTED Final   Entamoeba histolytica NOT DETECTED NOT DETECTED Final   Giardia lamblia NOT DETECTED NOT DETECTED Final   Adenovirus F40/41 NOT DETECTED NOT DETECTED Final   Astrovirus NOT DETECTED NOT DETECTED Final  Norovirus GI/GII NOT DETECTED NOT DETECTED Final   Rotavirus A NOT DETECTED NOT DETECTED Final   Sapovirus (I, II, IV, and V) NOT DETECTED NOT DETECTED Final    Comment: Performed at [facility], [address] [place], [place] [address]  C Difficile Quick Screen w PCR reflex     Status: None   Collection Time: 12/27/20  8:39 PM   Specimen: Stool  Result Value Ref Range Status   C Diff antigen NEGATIVE NEGATIVE Final   C Diff toxin NEGATIVE NEGATIVE Final   C Diff interpretation No C. difficile detected.  Final    Comment: Performed at [facility], [address] [place], [place] [address]  Urine Culture     Status: Abnormal (Preliminary result)   Collection Time: 12/28/20  6:02 PM   Specimen: PATH Cytology Urine  Result Value Ref Range Status   Specimen Description   Final    URINE, RANDOM Performed at [facility], [address] [place], [place] [address]    Special Requests   Final    CYTO  URINE Performed at [facility], [address] [place], [place] [address]    Culture (A)  Final    >=100,000 COLONIES/mL Romie MinusGRAM NEGATIVE RODS SUSCEPTIBILITIES TO FOLLOW Performed at [facility], [address] [place], [place] [address]    Report Status PENDING  Incomplete  Urine culture     Status: Abnormal (Preliminary result)   Collection Time: 12/28/20  6:10 PM   Specimen: Urine, Random  Result Value Ref Range Status   Specimen Description   Final    URINE, RANDOM Performed at [facility], [address] [place], [place] [address]    Special Requests   Final    RIGHT RENAL PELVIS URINE CULTURE Performed at [facility], [address] [place], [place] [address]    Culture (A)  Final    >=100,000 COLONIES/mL GRAM NEGATIVE RODS SUSCEPTIBILITIES TO FOLLOW Performed at [facility], [address] [place], [place] [address]    Report Status PENDING  Incomplete  CULTURE, BLOOD (ROUTINE X 2) w Reflex to ID Panel     Status: None (Preliminary result)   Collection Time: 12/28/20  7:10 PM   Specimen: BLOOD  Result Value Ref Range Status   Specimen Description BLOOD BLOOD RIGHT HAND  Final   Special Requests   Final    BOTTLES DRAWN AEROBIC AND ANAEROBIC Blood Culture adequate volume   Culture   Final    NO GROWTH 2 DAYS Performed at [facility], [address] [place], [place] [address]    Report Status PENDING  Incomplete  CULTURE, BLOOD (ROUTINE X 2) w Reflex to ID Panel     Status: None (Preliminary result)   Collection Time: 12/28/20  7:46 PM   Specimen: BLOOD  Result Value Ref Range Status   Specimen Description BLOOD BLOOD LEFT HAND  Final   Special Requests   Final    BOTTLES DRAWN AEROBIC AND ANAEROBIC Blood Culture adequate volume   Culture   Final    NO GROWTH 2 DAYS Performed at [facility], [address] [place], [place] [address]    Report Status PENDING  Incomplete    Coagulation  Studies: Recent Labs    12/27/20 1841  LABPROT 14.3  INR 1.2    Urinalysis: No results for input(s): COLORURINE, LABSPEC, PHURINE, GLUCOSEU, HGBUR, BILIRUBINUR, KETONESUR, PROTEINUR, UROBILINOGEN, NITRITE, LEUKOCYTESUR in the last  72 hours.  Invalid input(s): APPERANCEUR    Imaging: CT ABDOMEN PELVIS WO CONTRAST  Result Date: 12/28/2020 CLINICAL DATA:  Flank pain, kidney stones suspected, right hydronephrosis by ultrasound EXAM: CT ABDOMEN AND PELVIS WITHOUT CONTRAST TECHNIQUE: Multidetector CT imaging of the abdomen and pelvis was performed following the standard protocol without IV contrast. COMPARISON:  Same day renal ultrasound, 12/28/2020 FINDINGS: Lower chest: Atelectasis and/or consolidation of the dependent right lung base. Hepatobiliary: No solid liver abnormality is seen. Hepatic steatosis. No gallstones, gallbladder wall thickening, or biliary dilatation. Pancreas: Unremarkable. No pancreatic ductal dilatation or surrounding inflammatory changes. Spleen: Normal in size without significant abnormality. Adrenals/Urinary Tract: Adrenal glands are unremarkable. Moderate right hydronephrosis with an oblong calculus at the right ureteropelvic junction measuring 2.0 cm in length. Right-sided perinephric fat stranding. No additional calculi identified. Stomach/Bowel: Stomach is within normal limits. Appendix appears normal. No evidence of bowel wall thickening, distention, or inflammatory changes. Vascular/Lymphatic: Aortic atherosclerosis. No enlarged abdominal or pelvic lymph nodes. Reproductive: Status post hysterectomy. Other: No abdominal wall hernia or abnormality. No abdominopelvic ascites. Musculoskeletal: No acute or significant osseous findings. IMPRESSION: 1. Moderate right hydronephrosis with an oblong calculus at the right ureteropelvic junction measuring 2.0 cm in length. Right-sided perinephric fat stranding. No additional calculi identified. 2. Hepatic steatosis. Aortic  Atherosclerosis (ICD10-I70.0). Electronically Signed   By: Lauralyn Primes M.D.   On: 12/28/2020 12:49   DG OR UROLOGY CYSTO IMAGE ([facility] ONLY)  Result Date: 12/28/2020 There is no interpretation for this exam.  This order is for images obtained during a surgical procedure.  Please See "Surgeries" Tab for more information regarding the procedure.   ECHOCARDIOGRAM COMPLETE  Result Date: 12/29/2020    ECHOCARDIOGRAM REPORT   Patient Name:   LEANER MORICI Date of Exam: 12/29/2020 Medical Rec #:  [id number]     Height:       67.0 in Accession #:    [id number]    Weight:       212.4 lb Date of Birth:  04-30-1951     BSA:          2.075 m Patient Age:    69 years      BP:           110/75 mmHg Patient Gender: F             HR:           81 bpm. Exam Location:  [facility] Procedure: 2D Echo, Cardiac Doppler and Color Doppler Indications:     Atrial Fibrillation I48.91  History:         Patient has no prior history of Echocardiogram examinations.                  Risk Factors:Hypertension and Diabetes.  Sonographer:     Cristela Blue RDCS (AE) Referring Phys:  [id number] Bjorn Pippin Diagnosing Phys: Lorine Bears MD  Sonographer Comments: No apical window, no subcostal window and Technically challenging study due to limited acoustic windows. IMPRESSIONS  1. Left ventricular ejection fraction, by estimation, is 55 to 60%. The left ventricle has normal function. The left ventricle has no regional wall motion abnormalities. There is mild left ventricular hypertrophy. Left ventricular diastolic function could not be evaluated.  2. Right ventricular systolic function is normal. The right ventricular size is normal. There is normal pulmonary artery systolic pressure.  3. Left atrial size was moderately dilated.  4. The mitral valve is normal in structure. Mild to moderate mitral valve regurgitation. No  evidence of mitral stenosis.  5. The aortic valve is normal in structure. Aortic valve regurgitation is mild. Mild aortic valve sclerosis is  present, with no evidence of aortic valve stenosis.  6. Aortic dilatation noted. There is mild dilatation of the ascending aorta, measuring 41 mm.  7. Only parasternal views. FINDINGS  Left Ventricle: Left ventricular ejection fraction, by estimation, is 55 to 60%. The left ventricle has normal function. The left ventricle has no regional wall motion abnormalities. The left ventricular internal cavity size was normal in size. There is  mild left ventricular hypertrophy. Left ventricular diastolic function could not be evaluated. Right Ventricle: The right ventricular size is normal. No increase in right ventricular wall thickness. Right ventricular systolic function is normal. There is normal pulmonary artery systolic pressure. The tricuspid regurgitant velocity is 2.59 m/s, and  with an assumed right atrial pressure of 5 mmHg, the estimated right ventricular systolic pressure is 31.8 mmHg. Left Atrium: Left atrial size was moderately dilated. Right Atrium: Right atrial size was normal in size. Pericardium: There is no evidence of pericardial effusion. Mitral Valve: The mitral valve is normal in structure. Mild to moderate mitral valve regurgitation. No evidence of mitral valve stenosis. Tricuspid Valve: The tricuspid valve is normal in structure. Tricuspid valve regurgitation is mild . No evidence of tricuspid stenosis. Aortic Valve: The aortic valve is normal in structure. Aortic valve regurgitation is mild. Mild aortic valve sclerosis is present, with no evidence of aortic valve stenosis. Pulmonic Valve: The pulmonic valve was normal in structure. Pulmonic valve regurgitation is mild. No evidence of pulmonic stenosis. Aorta: Aortic dilatation noted. There is mild dilatation of the ascending aorta, measuring 41 mm. Venous: The inferior vena cava was not well visualized. IAS/Shunts: No atrial level shunt detected by color flow Doppler.  LEFT VENTRICLE PLAX 2D LVIDd:         3.53 cm LVIDs:         2.54 cm LV PW:          1.11 cm LV IVS:        1.27 cm LVOT diam:     2.10 cm LVOT Area:     3.46 cm  LEFT ATRIUM         Index LA diam:    4.40 cm 2.12 cm/m                        PULMONIC VALVE AORTA                 PV Vmax:        0.78 m/s Ao Root diam: 3.65 cm PV Peak grad:   2.4 mmHg                       RVOT Peak grad: 2 mmHg  TRICUSPID VALVE TR Peak grad:   26.8 mmHg TR Vmax:        259.00 cm/s  SHUNTS Systemic Diam: 2.10 cm Lorine Bears MD Electronically signed by Lorine Bears MD Signature Date/Time: 12/29/2020/5:10:38 PM    Final      Medications:   . ceFEPime (MAXIPIME) IV Stopped (12/29/20 2053)  . diltiazem (CARDIZEM) infusion 5 mg/hr (12/30/20 1004)  . lactated ringers 125 mL/hr at 12/30/20 1610  . pantoprozole (PROTONIX) infusion 8 mg/hr (12/30/20 0627)   . atorvastatin  40 mg Oral QHS  . brimonidine  1 drop Both Eyes BID   And  . timolol  1  drop Both Eyes BID  . Chlorhexidine Gluconate Cloth  6 each Topical Daily  . feeding supplement  1 Container Oral TID BM  . insulin aspart  0-20 Units Subcutaneous Q4H  . insulin glargine  20 Units Subcutaneous Daily  . iron polysaccharides  150 mg Oral Daily  . latanoprost  1 drop Left Eye QHS  . metoprolol tartrate  12.5 mg Oral BID  . [START ON 12/31/2020] pantoprazole  40 mg Intravenous Q12H  . phosphorus  500 mg Oral BID   acetaminophen **OR** acetaminophen, ondansetron **OR** ondansetron (ZOFRAN) IV, traZODone  Assessment/ Plan:  Ms. Madelyn Tlatelpa is a 70 y.o.  female with past medical history of chronic cough, diabetes mellitus, GERD, hypertension and sleep apnea.  1. Acute Kidney Injury on chronic kidney disease stage 3B with baseline creatinine 1.26 and GFR of 44 on 01/08/2019.  Acute kidney injury secondary to prerenal obstruction Urology consulted and cleared obstructive stone Foley draining clear yellow urine Urine output improving   Lab Results  Component Value Date   CREATININE 6.31 (H) 12/30/2020   CREATININE 7.31 (H) 12/29/2020    CREATININE 6.81 (H) 12/28/2020    Intake/Output Summary (Last 24 hours) at 12/30/2020 1005 Last data filed at 12/30/2020 0627 Gross per 24 hour  Intake 3481.91 ml  Output 1175 ml  Net 2306.91 ml   2. Hypertension  BP stable on current regimen  3 Metabolic acidosis - IV bicarb infusion -Metoprolol  4. Hypokalemia Current level 2.7 Potassium po IV supplementation given also Magnesium level 1.6 Recheck K and Mg this afternoon  5. Afib with RVR - likely due to electrolyte imbalances - HR 130s-160s - IV Diltiazem restarted     LOS: 3 Adaleigh Warf 3/30/202210:05 AM

## 2020-12-30 NOTE — Progress Notes (Signed)
Mobility Specialist - Progress Note   12/30/20 1700  Mobility  Activity Refused mobility  Mobility performed by Mobility specialist    Mobility attempted session later this date after increase in K+ level (now 3.0) with manageable HR. However, pt eating supper upon arrival and requests to return at another time. Will attempt session next available date as appropriate. Family at bedside.    Filiberto Pinks Mobility Specialist 12/30/20, 5:59 PM

## 2020-12-31 DIAGNOSIS — N170 Acute kidney failure with tubular necrosis: Secondary | ICD-10-CM

## 2020-12-31 DIAGNOSIS — N10 Acute tubulo-interstitial nephritis: Secondary | ICD-10-CM | POA: Diagnosis not present

## 2020-12-31 DIAGNOSIS — N179 Acute kidney failure, unspecified: Secondary | ICD-10-CM

## 2020-12-31 DIAGNOSIS — K922 Gastrointestinal hemorrhage, unspecified: Secondary | ICD-10-CM | POA: Diagnosis not present

## 2020-12-31 DIAGNOSIS — E1165 Type 2 diabetes mellitus with hyperglycemia: Secondary | ICD-10-CM

## 2020-12-31 DIAGNOSIS — I4891 Unspecified atrial fibrillation: Secondary | ICD-10-CM | POA: Diagnosis not present

## 2020-12-31 DIAGNOSIS — A419 Sepsis, unspecified organism: Principal | ICD-10-CM

## 2020-12-31 DIAGNOSIS — N171 Acute kidney failure with acute cortical necrosis: Secondary | ICD-10-CM

## 2020-12-31 DIAGNOSIS — N1330 Unspecified hydronephrosis: Secondary | ICD-10-CM

## 2020-12-31 DIAGNOSIS — R6520 Severe sepsis without septic shock: Secondary | ICD-10-CM

## 2020-12-31 LAB — MAGNESIUM: Magnesium: 2.4 mg/dL (ref 1.7–2.4)

## 2020-12-31 LAB — BASIC METABOLIC PANEL
Anion gap: 15 (ref 5–15)
BUN: 80 mg/dL — ABNORMAL HIGH (ref 8–23)
CO2: 25 mmol/L (ref 22–32)
Calcium: 7.7 mg/dL — ABNORMAL LOW (ref 8.9–10.3)
Chloride: 99 mmol/L (ref 98–111)
Creatinine, Ser: 4.95 mg/dL — ABNORMAL HIGH (ref 0.44–1.00)
GFR, Estimated: 9 mL/min — ABNORMAL LOW (ref >60)
Glucose, Bld: 119 mg/dL — ABNORMAL HIGH (ref 70–99)
Potassium: 3.6 mmol/L (ref 3.5–5.1)
Sodium: 139 mmol/L (ref 135–145)

## 2020-12-31 LAB — CBC
HCT: 37.1 % (ref 36.0–46.0)
Hemoglobin: 12.3 g/dL (ref 12.0–15.0)
MCH: 26.1 pg (ref 26.0–34.0)
MCHC: 33.2 g/dL (ref 30.0–36.0)
MCV: 78.6 fL — ABNORMAL LOW (ref 80.0–100.0)
Platelets: 342 10*3/uL (ref 150–400)
RBC: 4.72 MIL/uL (ref 3.87–5.11)
RDW: 16.2 % — ABNORMAL HIGH (ref 11.5–15.5)
WBC: 12.5 10*3/uL — ABNORMAL HIGH (ref 4.0–10.5)
nRBC: 0 % (ref 0.0–0.2)

## 2020-12-31 LAB — URINE CULTURE
Culture: >=100000 — AB
Culture: >=100000 — AB

## 2020-12-31 LAB — GLUCOSE, CAPILLARY
Glucose-Capillary: 116 mg/dL — ABNORMAL HIGH (ref 70–99)
Glucose-Capillary: 179 mg/dL — ABNORMAL HIGH (ref 70–99)
Glucose-Capillary: 270 mg/dL — ABNORMAL HIGH (ref 70–99)
Glucose-Capillary: 260 mg/dL — ABNORMAL HIGH (ref 70–99)
Glucose-Capillary: 267 mg/dL — ABNORMAL HIGH (ref 70–99)

## 2020-12-31 LAB — PHOSPHORUS: Phosphorus: 3.7 mg/dL (ref 2.5–4.6)

## 2020-12-31 MED ORDER — DILTIAZEM HCL 30 MG PO TABS
45.0000 mg | ORAL_TABLET | Freq: Four times a day (QID) | ORAL | Status: DC
  Administered 2020-12-31 – 2021-01-02 (×8): 45 mg via ORAL
  Filled 2020-12-31 (×8): qty 2

## 2020-12-31 MED ORDER — CEFAZOLIN SODIUM-DEXTROSE 1-4 GM/50ML-% IV SOLN
1.0000 g | Freq: Two times a day (BID) | INTRAVENOUS | Status: DC
  Administered 2020-12-31 – 2021-01-02 (×4): 1 g via INTRAVENOUS
  Filled 2020-12-31 (×7): qty 50

## 2020-12-31 MED ORDER — INSULIN ASPART 100 UNIT/ML ~~LOC~~ SOLN
0.0000 [IU] | Freq: Three times a day (TID) | SUBCUTANEOUS | Status: DC
  Administered 2021-01-01 – 2021-01-02 (×4): 4 [IU] via SUBCUTANEOUS
  Administered 2021-01-02: 20 [IU] via SUBCUTANEOUS
  Administered 2021-01-03: 4 [IU] via SUBCUTANEOUS
  Administered 2021-01-03: 11 [IU] via SUBCUTANEOUS
  Filled 2020-12-31 (×7): qty 1

## 2020-12-31 NOTE — Progress Notes (Signed)
Progress Note  Patient Name: Cynthia Lucas Date of Encounter: 12/31/2020  Primary Cardiologist: New to [facility] - consult by Kirke Corin  Subjective   She continues to note underlying fatigue and dyspnea.  She has been weaned off diltiazem drip, which was previously resumed in the setting of A. fib with RVR, and is now on short acting diltiazem with persistent A. fib with ventricular rates ranging from the 90s to 120s bpm.  BP has been in the 90s to low 100 systolic.  Renal function is improving with a SCr from 6.31-4.95.  Potassium improved from 3.0-3.6.  Inpatient Medications    Scheduled Meds: . atorvastatin  40 mg Oral QHS  . brimonidine  1 drop Both Eyes BID   And  . timolol  1 drop Both Eyes BID  . Chlorhexidine Gluconate Cloth  6 each Topical Daily  . diltiazem  45 mg Oral Q6H  . feeding supplement  1 Container Oral TID BM  . insulin aspart  0-20 Units Subcutaneous Q4H  . insulin glargine  20 Units Subcutaneous Daily  . iron polysaccharides  150 mg Oral Daily  . latanoprost  1 drop Left Eye QHS  . magnesium oxide  400 mg Oral Daily  . pantoprazole  40 mg Intravenous Q12H  . tamsulosin  0.4 mg Oral Daily   Continuous Infusions: .  ceFAZolin (ANCEF) IV     PRN Meds: acetaminophen **OR** acetaminophen, HYDROcodone-acetaminophen, ondansetron **OR** ondansetron (ZOFRAN) IV, traZODone   Vital Signs    Vitals:   12/31/20 0501 12/31/20 0803 12/31/20 1153 12/31/20 1200  BP: 112/71 116/83 111/71   Pulse:  (!) 104 (!) 125 100  Resp:  14 20   Temp:   98.3 F (36.8 C)   TempSrc:   Oral   SpO2:  93% 95%   Weight:      Height:        Intake/Output Summary (Last 24 hours) at 12/31/2020 1253 Last data filed at 12/31/2020 1155 Gross per 24 hour  Intake 2799.79 ml  Output 4050 ml  Net -1250.21 ml   Filed Weights   12/29/20 0400 12/29/20 0754 12/31/20 0300  Weight: 94.7 kg 96.3 kg 102.2 kg    Telemetry    A. fib with ventricular rates in the 90s to 120s bpm - Personally  Reviewed  ECG    No new tracings - Personally Reviewed  Physical Exam   GEN: No acute distress.   Neck: No JVD. Cardiac: IRIR, no murmurs, rubs, or gallops.  Respiratory: Clear to auscultation bilaterally.  GI: Soft, nontender, non-distended.   MS: No edema; No deformity. Neuro:  Alert and oriented x 3; Nonfocal.  Psych: Normal affect.  Labs    Chemistry Recent Labs  Lab 12/27/20 1841 12/28/20 0502 12/29/20 0456 12/30/20 0341 12/30/20 1503 12/31/20 0526  NA 137   < > 131* 138  --  139  K 2.9*   < > 3.3* 2.7* 3.0* 3.6  CL 100   < > 98 98  --  99  CO2 21*   < > 21* 27  --  25  GLUCOSE 441*   < > 543* 133*  --  119*  BUN 73*   < > 87* 88*  --  80*  CREATININE 6.56*   < > 7.31* 6.31*  --  4.95*  CALCIUM 8.3*   < > 7.0* 6.8*  --  7.7*  PROT 7.9  --  6.0* 5.8*  --   --   ALBUMIN  3.3*  --  2.4* 2.2*  --   --   AST 38  --  25 23  --   --   ALT 21  --  20 14  --   --   ALKPHOS 58  --  55 53  --   --   BILITOT 0.7  --  0.5 0.6  --   --   GFRNONAA 6*   < > 6* 7*  --  9*  ANIONGAP 16*   < > 12 13  --  15   < > = values in this interval not displayed.     Hematology Recent Labs  Lab 12/29/20 0456 12/30/20 0341 12/31/20 0526  WBC 9.8 12.9* 12.5*  RBC 4.08 4.16 4.72  HGB 10.8* 10.7* 12.3  HCT 32.0* 32.0* 37.1  MCV 78.4* 76.9* 78.6*  MCH 26.5 25.7* 26.1  MCHC 33.8 33.4 33.2  RDW 16.3* 15.7* 16.2*  PLT 196 242 342    Cardiac EnzymesNo results for input(s): TROPONINI in the last 168 hours. No results for input(s): TROPIPOC in the last 168 hours.   BNP Recent Labs  Lab 12/27/20 1841  BNP 530.7*     DDimer No results for input(s): DDIMER in the last 168 hours.   Radiology    DG Abd 1 View  Result Date: 12/30/2020 IMPRESSION: Double-J stent now present on the right. Previously noted calculus at the right ureteropelvic junction not seen currently. No bowel obstruction or free air.  Visualized lung bases clear. Electronically Signed   By: Bretta Bang III  M.D.   On: 12/30/2020 14:35    Cardiac Studies   2D echo 12/29/2020: 1. Left ventricular ejection fraction, by estimation, is 55 to 60%. The  left ventricle has normal function. The left ventricle has no regional  wall motion abnormalities. There is mild left ventricular hypertrophy.  Left ventricular diastolic function  could not be evaluated.  2. Right ventricular systolic function is normal. The right ventricular  size is normal. There is normal pulmonary artery systolic pressure.  3. Left atrial size was moderately dilated.  4. The mitral valve is normal in structure. Mild to moderate mitral valve  regurgitation. No evidence of mitral stenosis.  5. The aortic valve is normal in structure. Aortic valve regurgitation is  mild. Mild aortic valve sclerosis is present, with no evidence of aortic  valve stenosis.  6. Aortic dilatation noted. There is mild dilatation of the ascending  aorta, measuring 41 mm.  7. Only parasternal views.   Patient Profile     70 y.o. female with history of DM2, essential tremor, sleep apnea, HTN, and GERD who was admitted with abdominal pain and coffee-ground emesis with mild anemia and found to have acute renal failure in the setting of obstructive uropathy and pyelonephritis who we are seeing for new onset A. fib with RVR of unknown chronicity.  Assessment & Plan    1.  New onset A. fib with RVR: -Chronicity remains uncertain at this time -Atrial arrhythmia diagnosed in the setting of acute renal failure in the setting of possible obstructive uropathy with pyelonephritis and suspected GI bleed -Overall it appears her ventricular rates have been better controlled on diltiazem drip -After discussion with rounding cardiology MD, we will increase her diltiazem to 45 mg q 6 hours in an effort to improve ventricular rate control -Overall this is somewhat of a difficult situation as we are unable to use digoxin secondary to acute renal failure and she  previously did not respond to metoprolol -Amiodarone for rate control is not ideal either given that she has not been adequately anticoagulated -CHA2DS2-VASc of 4, however anticoagulation has been deferred at the present time due to suspected GI bleed and acute renal failure.  Once these issues improve anticoagulation would be indicated -Potassium improved as outlined below -Magnesium repleted -TSH normal  2.  Elevated troponin: -Possibly supply demand ischemia in the setting of A. fib with RVR in the context of acute renal failure and suspected GI bleed -Echo this admission showed normal LV systolic function and wall motion -Consider outpatient Lexiscan MPI or coronary CTA  3.  ARF: -Felt to be secondary to obstructive uropathy with pyelonephritis -Renal function is mildly improved -Nephrology is following  4. Suspected GI bleed: -Hemoglobin is improved -GI is planning for EGD on 4/1  5.  Preoperative cardiac risk stratification: -Patient is needing to undergo EGD for evaluation of suspected GI bleed -Per revised cardiac index she is low risk for noncardiac procedure -At baseline, prior to admission she is able to achieve greater than 4 METs without cardiac limitation -Echo this admission showed normal EF with normal wall motion -Patient may proceed at an overall low risk without further cardiac testing  For questions or updates, please contact [facility] Please consult [email or web address] for contact info under Cardiology/STEMI.    Signed, Eula Listen, PA-C [facility] Pager: [phone] 12/31/2020, 12:53 PM

## 2020-12-31 NOTE — H&P (View-Only) (Signed)
Cynthia Lucas , MD [address], [address], [place], [place], [id number] [address], [place], [place], [address] Phone: [phone]  Fax: [phone]   Cynthia Lucas is being followed for hematemesis  Day 3 of follow up   Subjective: No further bleeding , has not had a bowel movement   Objective: Vital signs in last 24 hours: Vitals:   12/30/20 2339 12/31/20 0300 12/31/20 0501 12/31/20 0803  BP: 113/82 118/71 112/71 116/83  Pulse:  75  (!) 104  Resp:  18  14  Temp:  98.1 F (36.7 C)    TempSrc:  Axillary    SpO2:  95%  93%  Weight:  102.2 kg    Height:       Weight change: 5.897 kg  Intake/Output Summary (Last 24 hours) at 12/31/2020 0841 Last data filed at 12/31/2020 0500 Gross per 24 hour  Intake 2799.79 ml  Output 2400 ml  Net 399.79 ml     Exam: Heart:: without murmur or extra heart sounds Lungs: normal Abdomen: soft, nontender, normal bowel sounds   Lab Results: @LABTEST2 @ Micro Results: Recent Results (from the past 240 hour(s))  Resp Panel by RT-PCR (Flu A&B, Covid) Nasopharyngeal Swab     Status: None   Collection Time: 12/27/20  6:48 PM   Specimen: Nasopharyngeal Swab; Nasopharyngeal(NP) swabs in vial transport medium  Result Value Ref Range Status   SARS Coronavirus 2 by RT PCR NEGATIVE NEGATIVE Final    Comment: (NOTE) SARS-CoV-2 target nucleic acids are NOT DETECTED.  The SARS-CoV-2 RNA is generally detectable in upper respiratory specimens during the acute phase of infection. The lowest concentration of SARS-CoV-2 viral copies this assay can detect is 138 copies/mL. A negative result does not preclude SARS-Cov-2 infection and should not be used as the sole basis for treatment or other patient management decisions. A negative result may occur with  improper specimen collection/handling, submission of specimen other than nasopharyngeal swab, presence of viral mutation(s) within the areas targeted by this assay, and inadequate  number of viral copies(<138 copies/mL). A negative result must be combined with clinical observations, patient history, and epidemiological information. The expected result is Negative.  Fact Sheet for Patients:  BloggerCourse.comhttps://www.fda.gov/media/152166/download  Fact Sheet for Healthcare Providers:  SeriousBroker.ithttps://www.fda.gov/media/152162/download  This test is no t yet approved or cleared by the [place] FDA and  has been authorized for detection and/or diagnosis of SARS-CoV-2 by FDA under an Emergency Use Authorization (EUA). This EUA will remain  in effect (meaning this test can be used) for the duration of the COVID-19 declaration under Section 564(b)(1) of the Act, 21 U.S.C.section 360bbb-3(b)(1), unless the authorization is terminated  or revoked sooner.       Influenza A by PCR NEGATIVE NEGATIVE Final   Influenza B by PCR NEGATIVE NEGATIVE Final    Comment: (NOTE) The Xpert Xpress SARS-CoV-2/FLU/RSV plus assay is intended as an aid in the diagnosis of influenza from Nasopharyngeal swab specimens and should not be used as a sole basis for treatment. Nasal washings and aspirates are unacceptable for Xpert Xpress SARS-CoV-2/FLU/RSV testing.  Fact Sheet for Patients: BloggerCourse.comhttps://www.fda.gov/media/152166/download  Fact Sheet for Healthcare Providers: SeriousBroker.ithttps://www.fda.gov/media/152162/download  This test is not yet approved or cleared by the [place] FDA and has been authorized for detection and/or diagnosis of SARS-CoV-2 by FDA under an Emergency Use Authorization (EUA). This EUA will remain in effect (meaning this test can be used) for the duration of the COVID-19 declaration under Section 564(b)(1) of the Act, 21  U.S.C. section 360bbb-3(b)(1), unless the authorization is terminated or revoked.  Performed at [facility], [address] [place], [place] [address]   Gastrointestinal Panel by PCR , Stool     Status: None   Collection Time: 12/27/20  8:39 PM    Specimen: Stool  Result Value Ref Range Status   Campylobacter species NOT DETECTED NOT DETECTED Final   Plesimonas shigelloides NOT DETECTED NOT DETECTED Final   Salmonella species NOT DETECTED NOT DETECTED Final   Yersinia enterocolitica NOT DETECTED NOT DETECTED Final   Vibrio species NOT DETECTED NOT DETECTED Final   Vibrio cholerae NOT DETECTED NOT DETECTED Final   Enteroaggregative E coli (EAEC) NOT DETECTED NOT DETECTED Final   Enteropathogenic E coli (EPEC) NOT DETECTED NOT DETECTED Final   Enterotoxigenic E coli (ETEC) NOT DETECTED NOT DETECTED Final   Shiga like toxin producing E coli (STEC) NOT DETECTED NOT DETECTED Final   Shigella/Enteroinvasive E coli (EIEC) NOT DETECTED NOT DETECTED Final   Cryptosporidium NOT DETECTED NOT DETECTED Final   Cyclospora cayetanensis NOT DETECTED NOT DETECTED Final   Entamoeba histolytica NOT DETECTED NOT DETECTED Final   Giardia lamblia NOT DETECTED NOT DETECTED Final   Adenovirus F40/41 NOT DETECTED NOT DETECTED Final   Astrovirus NOT DETECTED NOT DETECTED Final   Norovirus GI/GII NOT DETECTED NOT DETECTED Final   Rotavirus A NOT DETECTED NOT DETECTED Final   Sapovirus (I, II, IV, and V) NOT DETECTED NOT DETECTED Final    Comment: Performed at [facility], [address] [place], [place] [address]  C Difficile Quick Screen w PCR reflex     Status: None   Collection Time: 12/27/20  8:39 PM   Specimen: Stool  Result Value Ref Range Status   C Diff antigen NEGATIVE NEGATIVE Final   C Diff toxin NEGATIVE NEGATIVE Final   C Diff interpretation No C. difficile detected.  Final    Comment: Performed at [facility], [address], [place], [place] [address]  Urine Culture     Status: Abnormal (Preliminary result)   Collection Time: 12/28/20  6:02 PM   Specimen: PATH Cytology Urine  Result Value Ref Range Status   Specimen Description   Final    URINE, RANDOM Performed at [facility], [address]  [place], [place] [address]    Special Requests   Final    CYTO URINE Performed at [facility], [address] [place], [place] [address]    Culture (A)  Final    >=100,000 COLONIES/mL ESCHERICHIA COLI SUSCEPTIBILITIES TO FOLLOW Performed at [facility], [address], [place], [place] [address]    Report Status PENDING  Incomplete  Urine culture     Status: Abnormal   Collection Time: 12/28/20  6:10 PM   Specimen: Urine, Random  Result Value Ref Range Status   Specimen Description   Final    URINE, RANDOM Performed at [facility], [address], [place], [place] [address]    Special Requests   Final    RIGHT RENAL PELVIS URINE CULTURE Performed at [facility], [address] [place], [place] [address]    Culture >=100,000 COLONIES/mL ESCHERICHIA COLI (A)  Final   Report Status 12/31/2020 FINAL  Final   Organism ID, Bacteria ESCHERICHIA COLI (A)  Final      Susceptibility   Escherichia coli - MIC*    AMPICILLIN >=32 RESISTANT Resistant     CEFAZOLIN <=4 SENSITIVE Sensitive  CEFEPIME <=0.12 SENSITIVE Sensitive     CEFTRIAXONE <=0.25 SENSITIVE Sensitive     CIPROFLOXACIN <=0.25 SENSITIVE Sensitive     GENTAMICIN <=1 SENSITIVE Sensitive     IMIPENEM <=0.25 SENSITIVE Sensitive     NITROFURANTOIN <=16 SENSITIVE Sensitive     TRIMETH/SULFA <=20 SENSITIVE Sensitive     AMPICILLIN/SULBACTAM >=32 RESISTANT Resistant     PIP/TAZO 8 SENSITIVE Sensitive     * >=100,000 COLONIES/mL ESCHERICHIA COLI  CULTURE, BLOOD (ROUTINE X 2) w Reflex to ID Panel     Status: None (Preliminary result)   Collection Time: 12/28/20  7:10 PM   Specimen: BLOOD  Result Value Ref Range Status   Specimen Description BLOOD BLOOD RIGHT HAND  Final   Special Requests   Final    BOTTLES DRAWN AEROBIC AND ANAEROBIC Blood Culture adequate volume   Culture   Final    NO GROWTH 3 DAYS Performed at [facility], [address] [place], [place] [address]     Report Status PENDING  Incomplete  CULTURE, BLOOD (ROUTINE X 2) w Reflex to ID Panel     Status: None (Preliminary result)   Collection Time: 12/28/20  7:46 PM   Specimen: BLOOD  Result Value Ref Range Status   Specimen Description BLOOD BLOOD LEFT HAND  Final   Special Requests   Final    BOTTLES DRAWN AEROBIC AND ANAEROBIC Blood Culture adequate volume   Culture   Final    NO GROWTH 3 DAYS Performed at [facility], [address], [place], [place] [address]    Report Status PENDING  Incomplete   Studies/Results: DG Abd 1 View  Result Date: 12/30/2020 CLINICAL DATA:  Abdominal pain with nausea and vomiting EXAM: ABDOMEN - 1 VIEW COMPARISON:  January 22, 2019 abdominal radiograph; CT abdomen and pelvis December 28, 2020 FINDINGS: The previously noted calculus at the right ureteropelvic junction is no longer evident. There is a double-J stent extending from the expected location of the right renal pelvis to the bladder. There is mild stool volume in the colon. There is no bowel dilatation or air-fluid level to suggest bowel obstruction. No free air. Visualized lung bases are clear. IMPRESSION: Double-J stent now present on the right. Previously noted calculus at the right ureteropelvic junction not seen currently. No bowel obstruction or free air.  Visualized lung bases clear. Electronically Signed   By: Bretta Bang III M.D.   On: 12/30/2020 14:35   ECHOCARDIOGRAM COMPLETE  Result Date: 12/29/2020    ECHOCARDIOGRAM REPORT   Patient Name:   Cynthia Lucas Date of Exam: 12/29/2020 Medical Rec #:  [id number]     Height:       67.0 in Accession #:    [id number]    Weight:       212.4 lb Date of Birth:  05/03/1951     BSA:          2.075 m Patient Age:    69 years      BP:           110/75 mmHg Patient Gender: F             HR:           81 bpm. Exam Location:  [facility] Procedure: 2D Echo, Cardiac Doppler and Color Doppler Indications:     Atrial Fibrillation I48.91  History:         Patient has  no prior history of Echocardiogram examinations.  Risk Factors:Hypertension and Diabetes.  Sonographer:     Cristela Blue RDCS (AE) Referring Phys:  [id number] [address] Pippin Diagnosing Phys: Lorine Bears MD  Sonographer Comments: No apical window, no subcostal window and Technically challenging study due to limited acoustic windows. IMPRESSIONS  1. Left ventricular ejection fraction, by estimation, is 55 to 60%. The left ventricle has normal function. The left ventricle has no regional wall motion abnormalities. There is mild left ventricular hypertrophy. Left ventricular diastolic function could not be evaluated.  2. Right ventricular systolic function is normal. The right ventricular size is normal. There is normal pulmonary artery systolic pressure.  3. Left atrial size was moderately dilated.  4. The mitral valve is normal in structure. Mild to moderate mitral valve regurgitation. No evidence of mitral stenosis.  5. The aortic valve is normal in structure. Aortic valve regurgitation is mild. Mild aortic valve sclerosis is present, with no evidence of aortic valve stenosis.  6. Aortic dilatation noted. There is mild dilatation of the ascending aorta, measuring 41 mm.  7. Only parasternal views. FINDINGS  Left Ventricle: Left ventricular ejection fraction, by estimation, is 55 to 60%. The left ventricle has normal function. The left ventricle has no regional wall motion abnormalities. The left ventricular internal cavity size was normal in size. There is  mild left ventricular hypertrophy. Left ventricular diastolic function could not be evaluated. Right Ventricle: The right ventricular size is normal. No increase in right ventricular wall thickness. Right ventricular systolic function is normal. There is normal pulmonary artery systolic pressure. The tricuspid regurgitant velocity is 2.59 m/s, and  with an assumed right atrial pressure of 5 mmHg, the estimated right ventricular systolic pressure is 31.8  mmHg. Left Atrium: Left atrial size was moderately dilated. Right Atrium: Right atrial size was normal in size. Pericardium: There is no evidence of pericardial effusion. Mitral Valve: The mitral valve is normal in structure. Mild to moderate mitral valve regurgitation. No evidence of mitral valve stenosis. Tricuspid Valve: The tricuspid valve is normal in structure. Tricuspid valve regurgitation is mild . No evidence of tricuspid stenosis. Aortic Valve: The aortic valve is normal in structure. Aortic valve regurgitation is mild. Mild aortic valve sclerosis is present, with no evidence of aortic valve stenosis. Pulmonic Valve: The pulmonic valve was normal in structure. Pulmonic valve regurgitation is mild. No evidence of pulmonic stenosis. Aorta: Aortic dilatation noted. There is mild dilatation of the ascending aorta, measuring 41 mm. Venous: The inferior vena cava was not well visualized. IAS/Shunts: No atrial level shunt detected by color flow Doppler.  LEFT VENTRICLE PLAX 2D LVIDd:         3.53 cm LVIDs:         2.54 cm LV PW:         1.11 cm LV IVS:        1.27 cm LVOT diam:     2.10 cm LVOT Area:     3.46 cm  LEFT ATRIUM         Index LA diam:    4.40 cm 2.12 cm/m                        PULMONIC VALVE AORTA                 PV Vmax:        0.78 m/s Ao Root diam: 3.65 cm PV Peak grad:   2.4 mmHg  RVOT Peak grad: 2 mmHg  TRICUSPID VALVE TR Peak grad:   26.8 mmHg TR Vmax:        259.00 cm/s  SHUNTS Systemic Diam: 2.10 cm Lorine Bears MD Electronically signed by Lorine Bears MD Signature Date/Time: 12/29/2020/5:10:38 PM    Final    Medications: I have reviewed the patient's current medications. Scheduled Meds: . atorvastatin  40 mg Oral QHS  . brimonidine  1 drop Both Eyes BID   And  . timolol  1 drop Both Eyes BID  . Chlorhexidine Gluconate Cloth  6 each Topical Daily  . diltiazem  30 mg Oral Q6H  . feeding supplement  1 Container Oral TID BM  . insulin aspart  0-20 Units  Subcutaneous Q4H  . insulin glargine  20 Units Subcutaneous Daily  . iron polysaccharides  150 mg Oral Daily  . latanoprost  1 drop Left Eye QHS  . magnesium oxide  400 mg Oral Daily  . pantoprazole  40 mg Intravenous Q12H  . phosphorus  500 mg Oral BID  . tamsulosin  0.4 mg Oral Daily   Continuous Infusions: . ceFEPime (MAXIPIME) IV 1 g (12/30/20 2047)  . lactated ringers 125 mL/hr at 12/31/20 0502   PRN Meds:.acetaminophen **OR** acetaminophen, HYDROcodone-acetaminophen, ondansetron **OR** ondansetron (ZOFRAN) IV, traZODone   Assessment: Active Problems:   GI bleeding   Acute renal failure syndrome (HCC)   Metabolic acidosis   Hypokalemia   Atrial fibrillation with rapid ventricular response (HCC)   Uncontrolled type 2 diabetes mellitus with hyperglycemia, without long-term current use of insulin (HCC)   Sepsis (HCC)   Acute pyelonephritis   Hydronephrosis of right kidney   Kidney stone on right side  CBC Latest Ref Rng & Units 12/31/2020 12/30/2020 12/29/2020  WBC 4.0 - 10.5 K/uL 12.5(H) 12.9(H) 9.8  Hemoglobin 12.0 - 15.0 g/dL 40.9 10.7(L) 10.8(L)  Hematocrit 36.0 - 46.0 % 37.1 32.0(L) 32.0(L)  Platelets 150 - 400 K/uL 342 242 196    Cynthia Lucas is a 70 y.o. y/o female with is admitted with acute nausea vomiting and nonbloody diarrhea.  I have been consulted for a GI bleed.  Patient's admission has been complicated by AKI, metabolic acidosis, hyperglycemia and the patient was noted to be in atrial fibrillation with RVR.  Elevated troponin.  Hemoglobin is close to baseline at around 11 g.  Microcytosis has been noted.  Apart from hematemesis no other overt blood loss reported.  Plan 1. EGD tomorrow .  Differentials include Mallory-Weiss tear versus gastritis which could be postinfectious.Awaiting final cardiac clearance after ECHO from cardiology   2. IV PPI, monitor CBC and transfuse as needed.  3. B12 and iron low- replace   4. Outpatient colonoscopy for colon  cancer screening    I have discussed alternative options, risks & benefits,  which include, but are not limited to, bleeding, infection, perforation,respiratory complication & drug reaction.  The patient agrees with this plan & written consent will be obtained.     LOS: 4 days   Cynthia Mood, MD 12/31/2020, 8:41 AM

## 2020-12-31 NOTE — Progress Notes (Signed)
[facility]  ROUNDING NOTE   Subjective:   Cynthia Lucas is a 70 y.o. female with past medical history of chronic cough, diabetes mellitus, GERD, hypertension and sleep apnea. She presents to the ED with complaints of weakness and tachycardia.   She is admitted for AKI   Patient is seen resting in bed Alert and oriented Complains of shortness of breath Tolerating meals but poor appetite Denies nausea  Foley 3.6L  Objective:  Vital signs in last 24 hours:  Temp:  [98.1 F (36.7 C)-98.3 F (36.8 C)] 98.3 F (36.8 C) (03/31 1153) Pulse Rate:  [70-125] 100 (03/31 1200) Resp:  [14-22] 20 (03/31 1153) BP: (96-118)/(71-83) 111/71 (03/31 1153) SpO2:  [93 %-97 %] 95 % (03/31 1153) Weight:  [102.2 kg] 102.2 kg (03/31 0300)  Weight change: 5.897 kg Filed Weights   12/29/20 0400 12/29/20 0754 12/31/20 0300  Weight: 94.7 kg 96.3 kg 102.2 kg    Intake/Output: I/O last 3 completed shifts: In: 4963.5 [P.O.:720; I.V.:3549.2; IV Piggyback:694.3] Out: 4775 [Urine:4775]   Intake/Output this shift:  Total I/O In: 360 [P.O.:360] Out: 1200 [Urine:1200]  Physical Exam: General: NAD,   Head: Normocephalic, atraumatic. Moist oral mucosal membranes  Eyes: Anicteric, PERRL  Neck: Supple, trachea midline  Lungs:  Crackles and wheezes, dyspnea  Heart: Irregular rate and rhythm  Abdomen:  Soft, nontender,   Extremities:  min peripheral edema.  Neurologic: Nonfocal, moving all four extremities  Skin: No lesions       Basic Metabolic Panel: Recent Labs  Lab 12/27/20 1841 12/27/20 2039 12/28/20 0502 12/28/20 1504 12/29/20 0456 12/30/20 0341 12/30/20 1503 12/31/20 0526  NA 137  --  136  --  131* 138  --  139  K 2.9*  --  3.0* 3.1* 3.3* 2.7* 3.0* 3.6  CL 100  --  105  --  98 98  --  99  CO2 21*  --  16*  --  21* 27  --  25  GLUCOSE 441*  --  295*  --  543* 133*  --  119*  BUN 73*  --  80*  --  87* 88*  --  80*  CREATININE 6.56*  --  6.81*  --  7.31* 6.31*  --   4.95*  CALCIUM 8.3*  --  7.9*  --  7.0* 6.8*  --  7.7*  MG 2.2 2.1  --  1.9  --  1.6*  --  2.4  PHOS  --   --   --   --  4.5  4.5 2.5  --  3.7    Liver Function Tests: Recent Labs  Lab 12/27/20 1841 12/29/20 0456 12/30/20 0341  AST 38 25 23  ALT 21 20 14   ALKPHOS 58 55 53  BILITOT 0.7 0.5 0.6  PROT 7.9 6.0* 5.8*  ALBUMIN 3.3* 2.4* 2.2*   No results for input(s): LIPASE, AMYLASE in the last 168 hours. No results for input(s): AMMONIA in the last 168 hours.  CBC: Recent Labs  Lab 12/27/20 1841 12/28/20 0502 12/29/20 0456 12/30/20 0341 12/31/20 0526  WBC 12.3* 11.7* 9.8 12.9* 12.5*  NEUTROABS 10.9*  --  8.7* 10.6*  --   HGB 13.7 11.5* 10.8* 10.7* 12.3  HCT 41.5 34.3* 32.0* 32.0* 37.1  MCV 79.2* 78.1* 78.4* 76.9* 78.6*  PLT 237 216 196 242 342    Cardiac Enzymes: No results for input(s): CKTOTAL, CKMB, CKMBINDEX, TROPONINI in the last 168 hours.  BNP: Invalid input(s): POCBNP  CBG: Recent Labs  Lab 12/30/20 2033 12/30/20 2329 12/31/20 0452 12/31/20 0827 12/31/20 1154  GLUCAP 214* 146* 116* 179* 270*    Microbiology: Results for orders placed or performed during the hospital encounter of 12/27/20  Resp Panel by RT-PCR (Flu A&B, Covid) Nasopharyngeal Swab     Status: None   Collection Time: 12/27/20  6:48 PM   Specimen: Nasopharyngeal Swab; Nasopharyngeal(NP) swabs in vial transport medium  Result Value Ref Range Status   SARS Coronavirus 2 by RT PCR NEGATIVE NEGATIVE Final    Comment: (NOTE) SARS-CoV-2 target nucleic acids are NOT DETECTED.  The SARS-CoV-2 RNA is generally detectable in upper respiratory specimens during the acute phase of infection. The lowest concentration of SARS-CoV-2 viral copies this assay can detect is 138 copies/mL. A negative result does not preclude SARS-Cov-2 infection and should not be used as the sole basis for treatment or other patient management decisions. A negative result may occur with  improper specimen  collection/handling, submission of specimen other than nasopharyngeal swab, presence of viral mutation(s) within the areas targeted by this assay, and inadequate number of viral copies(<138 copies/mL). A negative result must be combined with clinical observations, patient history, and epidemiological information. The expected result is Negative.  Fact Sheet for Patients:  BloggerCourse.comhttps://www.fda.gov/media/152166/download  Fact Sheet for Healthcare Providers:  SeriousBroker.ithttps://www.fda.gov/media/152162/download  This test is no t yet approved or cleared by the [place] FDA and  has been authorized for detection and/or diagnosis of SARS-CoV-2 by FDA under an Emergency Use Authorization (EUA). This EUA will remain  in effect (meaning this test can be used) for the duration of the COVID-19 declaration under Section 564(b)(1) of the Act, 21 U.S.C.section 360bbb-3(b)(1), unless the authorization is terminated  or revoked sooner.       Influenza A by PCR NEGATIVE NEGATIVE Final   Influenza B by PCR NEGATIVE NEGATIVE Final    Comment: (NOTE) The Xpert Xpress SARS-CoV-2/FLU/RSV plus assay is intended as an aid in the diagnosis of influenza from Nasopharyngeal swab specimens and should not be used as a sole basis for treatment. Nasal washings and aspirates are unacceptable for Xpert Xpress SARS-CoV-2/FLU/RSV testing.  Fact Sheet for Patients: BloggerCourse.comhttps://www.fda.gov/media/152166/download  Fact Sheet for Healthcare Providers: SeriousBroker.ithttps://www.fda.gov/media/152162/download  This test is not yet approved or cleared by the [place] FDA and has been authorized for detection and/or diagnosis of SARS-CoV-2 by FDA under an Emergency Use Authorization (EUA). This EUA will remain in effect (meaning this test can be used) for the duration of the COVID-19 declaration under Section 564(b)(1) of the Act, 21 U.S.C. section 360bbb-3(b)(1), unless the authorization is terminated or revoked.  Performed at [facility], [address] [place], [place] [address]   Gastrointestinal Panel by PCR , Stool     Status: None   Collection Time: 12/27/20  8:39 PM   Specimen: Stool  Result Value Ref Range Status   Campylobacter species NOT DETECTED NOT DETECTED Final   Plesimonas shigelloides NOT DETECTED NOT DETECTED Final   Salmonella species NOT DETECTED NOT DETECTED Final   Yersinia enterocolitica NOT DETECTED NOT DETECTED Final   Vibrio species NOT DETECTED NOT DETECTED Final   Vibrio cholerae NOT DETECTED NOT DETECTED Final   Enteroaggregative E coli (EAEC) NOT DETECTED NOT DETECTED Final   Enteropathogenic E coli (EPEC) NOT DETECTED NOT DETECTED Final   Enterotoxigenic E coli (ETEC) NOT DETECTED NOT DETECTED Final   Shiga like toxin producing E coli (STEC) NOT DETECTED NOT DETECTED Final   Shigella/Enteroinvasive E coli (EIEC) NOT DETECTED  NOT DETECTED Final   Cryptosporidium NOT DETECTED NOT DETECTED Final   Cyclospora cayetanensis NOT DETECTED NOT DETECTED Final   Entamoeba histolytica NOT DETECTED NOT DETECTED Final   Giardia lamblia NOT DETECTED NOT DETECTED Final   Adenovirus F40/41 NOT DETECTED NOT DETECTED Final   Astrovirus NOT DETECTED NOT DETECTED Final   Norovirus GI/GII NOT DETECTED NOT DETECTED Final   Rotavirus A NOT DETECTED NOT DETECTED Final   Sapovirus (I, II, IV, and V) NOT DETECTED NOT DETECTED Final    Comment: Performed at [facility], [address] [place], [place] [address]  C Difficile Quick Screen w PCR reflex     Status: None   Collection Time: 12/27/20  8:39 PM   Specimen: Stool  Result Value Ref Range Status   C Diff antigen NEGATIVE NEGATIVE Final   C Diff toxin NEGATIVE NEGATIVE Final   C Diff interpretation No C. difficile detected.  Final    Comment: Performed at [facility], [address] [place], [place] [address]  Urine Culture     Status: Abnormal   Collection Time: 12/28/20  6:02 PM   Specimen: PATH Cytology Urine   Result Value Ref Range Status   Specimen Description   Final    URINE, RANDOM Performed at [facility], [address] [place], [place] [address]    Special Requests   Final    CYTO URINE Performed at [facility], [address] [place], [place] [address]    Culture >=100,000 COLONIES/mL ESCHERICHIA COLI (A)  Final   Report Status 12/31/2020 FINAL  Final   Organism ID, Bacteria ESCHERICHIA COLI (A)  Final      Susceptibility   Escherichia coli - MIC*    AMPICILLIN >=32 RESISTANT Resistant     CEFAZOLIN <=4 SENSITIVE Sensitive     CEFEPIME <=0.12 SENSITIVE Sensitive     CEFTRIAXONE <=0.25 SENSITIVE Sensitive     CIPROFLOXACIN <=0.25 SENSITIVE Sensitive     GENTAMICIN <=1 SENSITIVE Sensitive     IMIPENEM <=0.25 SENSITIVE Sensitive     NITROFURANTOIN <=16 SENSITIVE Sensitive     TRIMETH/SULFA <=20 SENSITIVE Sensitive     AMPICILLIN/SULBACTAM >=32 RESISTANT Resistant     PIP/TAZO <=4 SENSITIVE Sensitive     * >=100,000 COLONIES/mL ESCHERICHIA COLI  Urine culture     Status: Abnormal   Collection Time: 12/28/20  6:10 PM   Specimen: Urine, Random  Result Value Ref Range Status   Specimen Description   Final    URINE, RANDOM Performed at [facility], [address], [place], [place] [address]    Special Requests   Final    RIGHT RENAL PELVIS URINE CULTURE Performed at [facility], [address] [place], [place] [address]    Culture >=100,000 COLONIES/mL ESCHERICHIA COLI (A)  Final   Report Status 12/31/2020 FINAL  Final   Organism ID, Bacteria ESCHERICHIA COLI (A)  Final      Susceptibility   Escherichia coli - MIC*    AMPICILLIN >=32 RESISTANT Resistant     CEFAZOLIN <=4 SENSITIVE Sensitive     CEFEPIME <=0.12 SENSITIVE Sensitive     CEFTRIAXONE <=0.25 SENSITIVE Sensitive     CIPROFLOXACIN <=0.25 SENSITIVE Sensitive     GENTAMICIN <=1 SENSITIVE Sensitive     IMIPENEM <=0.25 SENSITIVE Sensitive     NITROFURANTOIN <=16  SENSITIVE Sensitive     TRIMETH/SULFA <=20 SENSITIVE Sensitive     AMPICILLIN/SULBACTAM >=32 RESISTANT Resistant     PIP/TAZO 8  SENSITIVE Sensitive     * >=100,000 COLONIES/mL ESCHERICHIA COLI  CULTURE, BLOOD (ROUTINE X 2) w Reflex to ID Panel     Status: None (Preliminary result)   Collection Time: 12/28/20  7:10 PM   Specimen: BLOOD  Result Value Ref Range Status   Specimen Description BLOOD BLOOD RIGHT HAND  Final   Special Requests   Final    BOTTLES DRAWN AEROBIC AND ANAEROBIC Blood Culture adequate volume   Culture   Final    NO GROWTH 3 DAYS Performed at [facility], [address] [place], [place] [address]    Report Status PENDING  Incomplete  CULTURE, BLOOD (ROUTINE X 2) w Reflex to ID Panel     Status: None (Preliminary result)   Collection Time: 12/28/20  7:46 PM   Specimen: BLOOD  Result Value Ref Range Status   Specimen Description BLOOD BLOOD LEFT HAND  Final   Special Requests   Final    BOTTLES DRAWN AEROBIC AND ANAEROBIC Blood Culture adequate volume   Culture   Final    NO GROWTH 3 DAYS Performed at [facility], [address] [place], [place] [address]    Report Status PENDING  Incomplete    Coagulation Studies: No results for input(s): LABPROT, INR in the last 72 hours.  Urinalysis: Recent Labs    12/30/20 2310  COLORURINE YELLOW*  LABSPEC 1.009  PHURINE 5.0  GLUCOSEU NEGATIVE  HGBUR LARGE*  BILIRUBINUR NEGATIVE  KETONESUR NEGATIVE  PROTEINUR 30*  NITRITE NEGATIVE  LEUKOCYTESUR LARGE*      Imaging: DG Abd 1 View  Result Date: 12/30/2020 CLINICAL DATA:  Abdominal pain with nausea and vomiting EXAM: ABDOMEN - 1 VIEW COMPARISON:  January 22, 2019 abdominal radiograph; CT abdomen and pelvis December 28, 2020 FINDINGS: The previously noted calculus at the right ureteropelvic junction is no longer evident. There is a double-J stent extending from the expected location of the right renal pelvis to the bladder. There is mild stool  volume in the colon. There is no bowel dilatation or air-fluid level to suggest bowel obstruction. No free air. Visualized lung bases are clear. IMPRESSION: Double-J stent now present on the right. Previously noted calculus at the right ureteropelvic junction not seen currently. No bowel obstruction or free air.  Visualized lung bases clear. Electronically Signed   By: Bretta Bang III M.D.   On: 12/30/2020 14:35   ECHOCARDIOGRAM COMPLETE  Result Date: 12/29/2020    ECHOCARDIOGRAM REPORT   Patient Name:   HEIDEE AUDI Date of Exam: 12/29/2020 Medical Rec #:  [id number]     Height:       67.0 in Accession #:    [id number]    Weight:       212.4 lb Date of Birth:  September 22, 1951     BSA:          2.075 m Patient Age:    69 years      BP:           110/75 mmHg Patient Gender: F             HR:           81 bpm. Exam Location:  [facility] Procedure: 2D Echo, Cardiac Doppler and Color Doppler Indications:     Atrial Fibrillation I48.91  History:         Patient has no prior history of Echocardiogram examinations.  Risk Factors:Hypertension and Diabetes.  Sonographer:     Cristela Blue RDCS (AE) Referring Phys:  [id number] Bjorn Pippin Diagnosing Phys: Lorine Bears MD  Sonographer Comments: No apical window, no subcostal window and Technically challenging study due to limited acoustic windows. IMPRESSIONS  1. Left ventricular ejection fraction, by estimation, is 55 to 60%. The left ventricle has normal function. The left ventricle has no regional wall motion abnormalities. There is mild left ventricular hypertrophy. Left ventricular diastolic function could not be evaluated.  2. Right ventricular systolic function is normal. The right ventricular size is normal. There is normal pulmonary artery systolic pressure.  3. Left atrial size was moderately dilated.  4. The mitral valve is normal in structure. Mild to moderate mitral valve regurgitation. No evidence of mitral stenosis.  5. The aortic valve is normal in  structure. Aortic valve regurgitation is mild. Mild aortic valve sclerosis is present, with no evidence of aortic valve stenosis.  6. Aortic dilatation noted. There is mild dilatation of the ascending aorta, measuring 41 mm.  7. Only parasternal views. FINDINGS  Left Ventricle: Left ventricular ejection fraction, by estimation, is 55 to 60%. The left ventricle has normal function. The left ventricle has no regional wall motion abnormalities. The left ventricular internal cavity size was normal in size. There is  mild left ventricular hypertrophy. Left ventricular diastolic function could not be evaluated. Right Ventricle: The right ventricular size is normal. No increase in right ventricular wall thickness. Right ventricular systolic function is normal. There is normal pulmonary artery systolic pressure. The tricuspid regurgitant velocity is 2.59 m/s, and  with an assumed right atrial pressure of 5 mmHg, the estimated right ventricular systolic pressure is 31.8 mmHg. Left Atrium: Left atrial size was moderately dilated. Right Atrium: Right atrial size was normal in size. Pericardium: There is no evidence of pericardial effusion. Mitral Valve: The mitral valve is normal in structure. Mild to moderate mitral valve regurgitation. No evidence of mitral valve stenosis. Tricuspid Valve: The tricuspid valve is normal in structure. Tricuspid valve regurgitation is mild . No evidence of tricuspid stenosis. Aortic Valve: The aortic valve is normal in structure. Aortic valve regurgitation is mild. Mild aortic valve sclerosis is present, with no evidence of aortic valve stenosis. Pulmonic Valve: The pulmonic valve was normal in structure. Pulmonic valve regurgitation is mild. No evidence of pulmonic stenosis. Aorta: Aortic dilatation noted. There is mild dilatation of the ascending aorta, measuring 41 mm. Venous: The inferior vena cava was not well visualized. IAS/Shunts: No atrial level shunt detected by color flow Doppler.   LEFT VENTRICLE PLAX 2D LVIDd:         3.53 cm LVIDs:         2.54 cm LV PW:         1.11 cm LV IVS:        1.27 cm LVOT diam:     2.10 cm LVOT Area:     3.46 cm  LEFT ATRIUM         Index LA diam:    4.40 cm 2.12 cm/m                        PULMONIC VALVE AORTA                 PV Vmax:        0.78 m/s Ao Root diam: 3.65 cm PV Peak grad:   2.4 mmHg  RVOT Peak grad: 2 mmHg  TRICUSPID VALVE TR Peak grad:   26.8 mmHg TR Vmax:        259.00 cm/s  SHUNTS Systemic Diam: 2.10 cm Lorine Bears MD Electronically signed by Lorine Bears MD Signature Date/Time: 12/29/2020/5:10:38 PM    Final      Medications:   .  ceFAZolin (ANCEF) IV     . atorvastatin  40 mg Oral QHS  . brimonidine  1 drop Both Eyes BID   And  . timolol  1 drop Both Eyes BID  . Chlorhexidine Gluconate Cloth  6 each Topical Daily  . diltiazem  30 mg Oral Q6H  . feeding supplement  1 Container Oral TID BM  . insulin aspart  0-20 Units Subcutaneous Q4H  . insulin glargine  20 Units Subcutaneous Daily  . iron polysaccharides  150 mg Oral Daily  . latanoprost  1 drop Left Eye QHS  . magnesium oxide  400 mg Oral Daily  . pantoprazole  40 mg Intravenous Q12H  . tamsulosin  0.4 mg Oral Daily   acetaminophen **OR** acetaminophen, HYDROcodone-acetaminophen, ondansetron **OR** ondansetron (ZOFRAN) IV, traZODone  Assessment/ Plan:  Ms. Cynthia Lucas is a 70 y.o.  female with past medical history of chronic cough, diabetes mellitus, GERD, hypertension and sleep apnea.  1. Acute Kidney Injury on chronic kidney disease stage 3B with baseline creatinine 1.26 and GFR of 44 on 01/08/2019.  Acute kidney injury secondary to prerenal obstruction Urology consulted and cleared obstructive stone Continued renal improvement Encourage oral intake D/c IVF due to fluid overload concerns Continue monitoring renal recovery Maintain foley for 24 hours to monitor urine output  Lab Results  Component Value Date   CREATININE 4.95  (H) 12/31/2020   CREATININE 6.31 (H) 12/30/2020   CREATININE 7.31 (H) 12/29/2020    Intake/Output Summary (Last 24 hours) at 12/31/2020 1242 Last data filed at 12/31/2020 1155 Gross per 24 hour  Intake 2799.79 ml  Output 4050 ml  Net -1250.21 ml   2. Hypertension  BP stable on current regimen  3 Metabolic acidosis -Metoprolol  4. Hypokalemia Current level 2.7 Potassium po IV supplementation given also Magnesium level 1.6 Recheck K and Mg this afternoon  5. Afib with RVR - likely due to electrolyte imbalances - HR 130s-160s - Transitioned to po Diltiazem      LOS: 4 Gavyn Zoss 3/31/202212:42 PM

## 2020-12-31 NOTE — Progress Notes (Signed)
Cynthia Lucas , MD [address], [address], [place], [place], [id number] [address], [place], [place], [id number] Phone: [phone]  Fax: [phone]   Cynthia KeySharon Lucas is being followed for hematemesis  Day 3 of follow up   Subjective: No further bleeding , has not had a bowel movement   Objective: Vital signs in last 24 hours: Vitals:   12/30/20 2339 12/31/20 0300 12/31/20 0501 12/31/20 0803  BP: 113/82 118/71 112/71 116/83  Pulse:  75  (!) 104  Resp:  18  14  Temp:  98.1 F (36.7 C)    TempSrc:  Axillary    SpO2:  95%  93%  Weight:  102.2 kg    Height:       Weight change: 5.897 kg  Intake/Output Summary (Last 24 hours) at 12/31/2020 0841 Last data filed at 12/31/2020 0500 Gross per 24 hour  Intake 2799.79 ml  Output 2400 ml  Net 399.79 ml     Exam: Heart:: without murmur or extra heart sounds Lungs: normal Abdomen: soft, nontender, normal bowel sounds   Lab Results: @LABTEST2 @ Micro Results: Recent Results (from the past 240 hour(s))  Resp Panel by RT-PCR (Flu A&B, Covid) Nasopharyngeal Swab     Status: None   Collection Time: 12/27/20  6:48 PM   Specimen: Nasopharyngeal Swab; Nasopharyngeal(NP) swabs in vial transport medium  Result Value Ref Range Status   SARS Coronavirus 2 by RT PCR NEGATIVE NEGATIVE Final    Comment: (NOTE) SARS-CoV-2 target nucleic acids are NOT DETECTED.  The SARS-CoV-2 RNA is generally detectable in upper respiratory specimens during the acute phase of infection. The lowest concentration of SARS-CoV-2 viral copies this assay can detect is 138 copies/mL. A negative result does not preclude SARS-Cov-2 infection and should not be used as the sole basis for treatment or other patient management decisions. A negative result may occur with  improper specimen collection/handling, submission of specimen other than nasopharyngeal swab, presence of viral mutation(s) within the areas targeted by this assay, and inadequate  number of viral copies(<138 copies/mL). A negative result must be combined with clinical observations, patient history, and epidemiological information. The expected result is Negative.  Fact Sheet for Patients:  BloggerCourse.comhttps://www.fda.gov/media/152166/download  Fact Sheet for Healthcare Providers:  SeriousBroker.ithttps://www.fda.gov/media/152162/download  This test is no t yet approved or cleared by the [place] FDA and  has been authorized for detection and/or diagnosis of SARS-CoV-2 by FDA under an Emergency Use Authorization (EUA). This EUA will remain  in effect (meaning this test can be used) for the duration of the COVID-19 declaration under Section 564(b)(1) of the Act, 21 U.S.C.section 360bbb-3(b)(1), unless the authorization is terminated  or revoked sooner.       Influenza A by PCR NEGATIVE NEGATIVE Final   Influenza B by PCR NEGATIVE NEGATIVE Final    Comment: (NOTE) The Xpert Xpress SARS-CoV-2/FLU/RSV plus assay is intended as an aid in the diagnosis of influenza from Nasopharyngeal swab specimens and should not be used as a sole basis for treatment. Nasal washings and aspirates are unacceptable for Xpert Xpress SARS-CoV-2/FLU/RSV testing.  Fact Sheet for Patients: BloggerCourse.comhttps://www.fda.gov/media/152166/download  Fact Sheet for Healthcare Providers: SeriousBroker.ithttps://www.fda.gov/media/152162/download  This test is not yet approved or cleared by the [place] FDA and has been authorized for detection and/or diagnosis of SARS-CoV-2 by FDA under an Emergency Use Authorization (EUA). This EUA will remain in effect (meaning this test can be used) for the duration of the COVID-19 declaration under Section 564(b)(1) of the Act, 21  U.S.C. section 360bbb-3(b)(1), unless the authorization is terminated or revoked.  Performed at [facility], [address] [place], [place] [address]   Gastrointestinal Panel by PCR , Stool     Status: None   Collection Time: 12/27/20  8:39 PM    Specimen: Stool  Result Value Ref Range Status   Campylobacter species NOT DETECTED NOT DETECTED Final   Plesimonas shigelloides NOT DETECTED NOT DETECTED Final   Salmonella species NOT DETECTED NOT DETECTED Final   Yersinia enterocolitica NOT DETECTED NOT DETECTED Final   Vibrio species NOT DETECTED NOT DETECTED Final   Vibrio cholerae NOT DETECTED NOT DETECTED Final   Enteroaggregative E coli (EAEC) NOT DETECTED NOT DETECTED Final   Enteropathogenic E coli (EPEC) NOT DETECTED NOT DETECTED Final   Enterotoxigenic E coli (ETEC) NOT DETECTED NOT DETECTED Final   Shiga like toxin producing E coli (STEC) NOT DETECTED NOT DETECTED Final   Shigella/Enteroinvasive E coli (EIEC) NOT DETECTED NOT DETECTED Final   Cryptosporidium NOT DETECTED NOT DETECTED Final   Cyclospora cayetanensis NOT DETECTED NOT DETECTED Final   Entamoeba histolytica NOT DETECTED NOT DETECTED Final   Giardia lamblia NOT DETECTED NOT DETECTED Final   Adenovirus F40/41 NOT DETECTED NOT DETECTED Final   Astrovirus NOT DETECTED NOT DETECTED Final   Norovirus GI/GII NOT DETECTED NOT DETECTED Final   Rotavirus A NOT DETECTED NOT DETECTED Final   Sapovirus (I, II, IV, and V) NOT DETECTED NOT DETECTED Final    Comment: Performed at [facility], [address] [place], [place] [address]  C Difficile Quick Screen w PCR reflex     Status: None   Collection Time: 12/27/20  8:39 PM   Specimen: Stool  Result Value Ref Range Status   C Diff antigen NEGATIVE NEGATIVE Final   C Diff toxin NEGATIVE NEGATIVE Final   C Diff interpretation No C. difficile detected.  Final    Comment: Performed at [facility], [address], [place], [place] [address]  Urine Culture     Status: Abnormal (Preliminary result)   Collection Time: 12/28/20  6:02 PM   Specimen: PATH Cytology Urine  Result Value Ref Range Status   Specimen Description   Final    URINE, RANDOM Performed at [facility], [address]  [place], [place] [address]    Special Requests   Final    CYTO URINE Performed at [facility], [address] [place], [place] [address]    Culture (A)  Final    >=100,000 COLONIES/mL ESCHERICHIA COLI SUSCEPTIBILITIES TO FOLLOW Performed at [facility], [address], [place], [place] [address]    Report Status PENDING  Incomplete  Urine culture     Status: Abnormal   Collection Time: 12/28/20  6:10 PM   Specimen: Urine, Random  Result Value Ref Range Status   Specimen Description   Final    URINE, RANDOM Performed at [facility], [address], [place], [place] [address]    Special Requests   Final    RIGHT RENAL PELVIS URINE CULTURE Performed at [facility], [address] [place], [place] [address]    Culture >=100,000 COLONIES/mL ESCHERICHIA COLI (A)  Final   Report Status 12/31/2020 FINAL  Final   Organism ID, Bacteria ESCHERICHIA COLI (A)  Final      Susceptibility   Escherichia coli - MIC*    AMPICILLIN >=32 RESISTANT Resistant     CEFAZOLIN <=4 SENSITIVE Sensitive  CEFEPIME <=0.12 SENSITIVE Sensitive     CEFTRIAXONE <=0.25 SENSITIVE Sensitive     CIPROFLOXACIN <=0.25 SENSITIVE Sensitive     GENTAMICIN <=1 SENSITIVE Sensitive     IMIPENEM <=0.25 SENSITIVE Sensitive     NITROFURANTOIN <=16 SENSITIVE Sensitive     TRIMETH/SULFA <=20 SENSITIVE Sensitive     AMPICILLIN/SULBACTAM >=32 RESISTANT Resistant     PIP/TAZO 8 SENSITIVE Sensitive     * >=100,000 COLONIES/mL ESCHERICHIA COLI  CULTURE, BLOOD (ROUTINE X 2) w Reflex to ID Panel     Status: None (Preliminary result)   Collection Time: 12/28/20  7:10 PM   Specimen: BLOOD  Result Value Ref Range Status   Specimen Description BLOOD BLOOD RIGHT HAND  Final   Special Requests   Final    BOTTLES DRAWN AEROBIC AND ANAEROBIC Blood Culture adequate volume   Culture   Final    NO GROWTH 3 DAYS Performed at [facility], [address] [place], [place] [address]     Report Status PENDING  Incomplete  CULTURE, BLOOD (ROUTINE X 2) w Reflex to ID Panel     Status: None (Preliminary result)   Collection Time: 12/28/20  7:46 PM   Specimen: BLOOD  Result Value Ref Range Status   Specimen Description BLOOD BLOOD LEFT HAND  Final   Special Requests   Final    BOTTLES DRAWN AEROBIC AND ANAEROBIC Blood Culture adequate volume   Culture   Final    NO GROWTH 3 DAYS Performed at [facility], [address], [place], [place] [address]    Report Status PENDING  Incomplete   Studies/Results: DG Abd 1 View  Result Date: 12/30/2020 CLINICAL DATA:  Abdominal pain with nausea and vomiting EXAM: ABDOMEN - 1 VIEW COMPARISON:  January 22, 2019 abdominal radiograph; CT abdomen and pelvis December 28, 2020 FINDINGS: The previously noted calculus at the right ureteropelvic junction is no longer evident. There is a double-J stent extending from the expected location of the right renal pelvis to the bladder. There is mild stool volume in the colon. There is no bowel dilatation or air-fluid level to suggest bowel obstruction. No free air. Visualized lung bases are clear. IMPRESSION: Double-J stent now present on the right. Previously noted calculus at the right ureteropelvic junction not seen currently. No bowel obstruction or free air.  Visualized lung bases clear. Electronically Signed   By: Bretta Bang III M.D.   On: 12/30/2020 14:35   ECHOCARDIOGRAM COMPLETE  Result Date: 12/29/2020    ECHOCARDIOGRAM REPORT   Patient Name:   Cynthia Lucas Date of Exam: 12/29/2020 Medical Rec #:  [id number]     Height:       67.0 in Accession #:    [id number]    Weight:       212.4 lb Date of Birth:  05/03/1951     BSA:          2.075 m Patient Age:    69 years      BP:           110/75 mmHg Patient Gender: F             HR:           81 bpm. Exam Location:  [facility] Procedure: 2D Echo, Cardiac Doppler and Color Doppler Indications:     Atrial Fibrillation I48.91  History:         Patient has  no prior history of Echocardiogram examinations.  Risk Factors:Hypertension and Diabetes.  Sonographer:     Cristela Blue RDCS (AE) Referring Phys:  [id number] [address] Pippin Diagnosing Phys: Lorine Bears MD  Sonographer Comments: No apical window, no subcostal window and Technically challenging study due to limited acoustic windows. IMPRESSIONS  1. Left ventricular ejection fraction, by estimation, is 55 to 60%. The left ventricle has normal function. The left ventricle has no regional wall motion abnormalities. There is mild left ventricular hypertrophy. Left ventricular diastolic function could not be evaluated.  2. Right ventricular systolic function is normal. The right ventricular size is normal. There is normal pulmonary artery systolic pressure.  3. Left atrial size was moderately dilated.  4. The mitral valve is normal in structure. Mild to moderate mitral valve regurgitation. No evidence of mitral stenosis.  5. The aortic valve is normal in structure. Aortic valve regurgitation is mild. Mild aortic valve sclerosis is present, with no evidence of aortic valve stenosis.  6. Aortic dilatation noted. There is mild dilatation of the ascending aorta, measuring 41 mm.  7. Only parasternal views. FINDINGS  Left Ventricle: Left ventricular ejection fraction, by estimation, is 55 to 60%. The left ventricle has normal function. The left ventricle has no regional wall motion abnormalities. The left ventricular internal cavity size was normal in size. There is  mild left ventricular hypertrophy. Left ventricular diastolic function could not be evaluated. Right Ventricle: The right ventricular size is normal. No increase in right ventricular wall thickness. Right ventricular systolic function is normal. There is normal pulmonary artery systolic pressure. The tricuspid regurgitant velocity is 2.59 m/s, and  with an assumed right atrial pressure of 5 mmHg, the estimated right ventricular systolic pressure is 31.8  mmHg. Left Atrium: Left atrial size was moderately dilated. Right Atrium: Right atrial size was normal in size. Pericardium: There is no evidence of pericardial effusion. Mitral Valve: The mitral valve is normal in structure. Mild to moderate mitral valve regurgitation. No evidence of mitral valve stenosis. Tricuspid Valve: The tricuspid valve is normal in structure. Tricuspid valve regurgitation is mild . No evidence of tricuspid stenosis. Aortic Valve: The aortic valve is normal in structure. Aortic valve regurgitation is mild. Mild aortic valve sclerosis is present, with no evidence of aortic valve stenosis. Pulmonic Valve: The pulmonic valve was normal in structure. Pulmonic valve regurgitation is mild. No evidence of pulmonic stenosis. Aorta: Aortic dilatation noted. There is mild dilatation of the ascending aorta, measuring 41 mm. Venous: The inferior vena cava was not well visualized. IAS/Shunts: No atrial level shunt detected by color flow Doppler.  LEFT VENTRICLE PLAX 2D LVIDd:         3.53 cm LVIDs:         2.54 cm LV PW:         1.11 cm LV IVS:        1.27 cm LVOT diam:     2.10 cm LVOT Area:     3.46 cm  LEFT ATRIUM         Index LA diam:    4.40 cm 2.12 cm/m                        PULMONIC VALVE AORTA                 PV Vmax:        0.78 m/s Ao Root diam: 3.65 cm PV Peak grad:   2.4 mmHg  RVOT Peak grad: 2 mmHg  TRICUSPID VALVE TR Peak grad:   26.8 mmHg TR Vmax:        259.00 cm/s  SHUNTS Systemic Diam: 2.10 cm Muhammad Arida MD Electronically signed by Muhammad Arida MD Signature Date/Time: 12/29/2020/5:10:38 PM    Final    Medications: I have reviewed the patient's current medications. Scheduled Meds: . atorvastatin  40 mg Oral QHS  . brimonidine  1 drop Both Eyes BID   And  . timolol  1 drop Both Eyes BID  . Chlorhexidine Gluconate Cloth  6 each Topical Daily  . diltiazem  30 mg Oral Q6H  . feeding supplement  1 Container Oral TID BM  . insulin aspart  0-20 Units  Subcutaneous Q4H  . insulin glargine  20 Units Subcutaneous Daily  . iron polysaccharides  150 mg Oral Daily  . latanoprost  1 drop Left Eye QHS  . magnesium oxide  400 mg Oral Daily  . pantoprazole  40 mg Intravenous Q12H  . phosphorus  500 mg Oral BID  . tamsulosin  0.4 mg Oral Daily   Continuous Infusions: . ceFEPime (MAXIPIME) IV 1 g (12/30/20 2047)  . lactated ringers 125 mL/hr at 12/31/20 0502   PRN Meds:.acetaminophen **OR** acetaminophen, HYDROcodone-acetaminophen, ondansetron **OR** ondansetron (ZOFRAN) IV, traZODone   Assessment: Active Problems:   GI bleeding   Acute renal failure syndrome (HCC)   Metabolic acidosis   Hypokalemia   Atrial fibrillation with rapid ventricular response (HCC)   Uncontrolled type 2 diabetes mellitus with hyperglycemia, without long-term current use of insulin (HCC)   Sepsis (HCC)   Acute pyelonephritis   Hydronephrosis of right kidney   Kidney stone on right side  CBC Latest Ref Rng & Units 12/31/2020 12/30/2020 12/29/2020  WBC 4.0 - 10.5 K/uL 12.5(H) 12.9(H) 9.8  Hemoglobin 12.0 - 15.0 g/dL 12.3 10.7(L) 10.8(L)  Hematocrit 36.0 - 46.0 % 37.1 32.0(L) 32.0(L)  Platelets 150 - 400 K/uL 342 242 196    Cynthia Lucas is a 69 y.o. y/o female with is admitted with acute nausea vomiting and nonbloody diarrhea.  I have been consulted for a GI bleed.  Patient's admission has been complicated by AKI, metabolic acidosis, hyperglycemia and the patient was noted to be in atrial fibrillation with RVR.  Elevated troponin.  Hemoglobin is close to baseline at around 11 g.  Microcytosis has been noted.  Apart from hematemesis no other overt blood loss reported.  Plan 1. EGD tomorrow .  Differentials include Mallory-Weiss tear versus gastritis which could be postinfectious.Awaiting final cardiac clearance after ECHO from cardiology   2. IV PPI, monitor CBC and transfuse as needed.  3. B12 and iron low- replace   4. Outpatient colonoscopy for colon  cancer screening    I have discussed alternative options, risks & benefits,  which include, but are not limited to, bleeding, infection, perforation,respiratory complication & drug reaction.  The patient agrees with this plan & written consent will be obtained.     LOS: 4 days   Farin Buhman, MD 12/31/2020, 8:41 AM 

## 2020-12-31 NOTE — Plan of Care (Signed)

## 2020-12-31 NOTE — Progress Notes (Addendum)
PROGRESS NOTE    De Libman  ZOX:[id number] DOB: 1951-07-27 DOA: 12/27/2020 PCP: Toy Cookey, FNP   Follow-up pyelonephritis. Brief Narrative:  Cynthia Lucas a 70 y.o.African-American female femalewith medical history significant forhypertension, GERD, type 2 diabetes mellitus and sleep apnea as well as head tremors, who presented to the emergency room with acute onset of recurrent nausea and vomiting with diarrhea. She admits to coffee-ground emesis and denied any bright red bleeding per rectum or melena.  In the emergency room, she was found to have atrial fibrillation with rapid ventricular response, she also had acute renal failure with creatinine 6.56.  She is placed on IV fluids, consult from cardiology and GI was obtained from the emergency room. 3/28.  Patient ultrasound showed moderate hydronephrosis.  Ureteral stent was placed by urology, during the process, patient had pus drained from the kidney, consistent with acute pyelonephritis.  Cefepime was started.   Assessment & Plan:   Active Problems:   GI bleeding   Acute renal failure syndrome (HCC)   Metabolic acidosis   Hypokalemia   Atrial fibrillation with rapid ventricular response (HCC)   Uncontrolled type 2 diabetes mellitus with hyperglycemia, without long-term current use of insulin (HCC)   Sepsis (HCC)   Acute pyelonephritis   Hydronephrosis of right kidney   Kidney stone on right side  #1. Severe sepsis secondary to acute pyelonephritis. --on presentation, had severe tachycardia, tachypnea and leukocytosis.  Patient also had acute renal failure.  Pt started on cefepime. --urine cx pos for E coli --de-escalate from cefepime to Ancef today, per pharm rec.  # obstructing right sided kidney stone causing acute pyelonephritis and  Right hydronephrosis  S/p right stent placement --s/p MIVF for post-obstructive diuresis --Hold IVF today due to dyspnea -Outpatient URS/LL/stent exchange with Dr. Lonna Cobb in  2 to 3 weeks  2.  Acute renal failure, POA, improved  on chronic kidney disease stage IIIa. Non anion gap metabolic acidosis. Patient renal failure is secondary to obstruction and sepsis. Patient is followed by nephrology --MIVF d/c'ed by nephrology today due to dyspnea --cont Foley for strict I/O, per nephrology   Hypokalemia --replete with oral potassium  Hypomag --replete with IV mag  Hyponatremia, resolved  #3.  Poorly controlled type 2 diabetes with hyperglycemia. --A1c 7.9 --cont Lantus 20u daily --ACHS and SSI TID  4.  Coffee-ground emesis. Iron deficient anemia.  Borderline B12 level. Patient hemoglobin still stable at 10.8, S/p IV iron and Vit B12 injection Patient has been seen by GI, and work-up when patient medical condition is more stable. --cont IV PPI BID  5.  Acute metabolic encephalopathy. Secondary to sepsis, improved  #6. paroxysmal atrial fibrillation with rapid ventricular response. --s/p dilt gtt Plan: --oral dilt 45 mg q6h, per cardiology Not able to anticoagulate due to GI bleed.  Acute abdominal pain, new, resolved --onset morning of 3/30.  KUB remarkable, pt having normal BM's.  Discussed with urology, could be cramps from stent.  Started on Flomax --cont Flomax   DVT prophylaxis: SCDs Code Status: Full Family Communication:  Disposition Plan:   Status is: Inpatient  Remains inpatient appropriate because:Inpatient level of care appropriate due to severity of illness   Dispo: The patient is from: Home              Anticipated d/c is to: Home              Patient currently is not medically stable to d/c.  Cr is still in high 4's, on IV abx  Difficult to place patient No    I/O last 3 completed shifts: In: 4963.5 [P.O.:720; I.V.:3549.2; IV Piggyback:694.3] Out: 4775 [Urine:4775] Total I/O In: 360 [P.O.:360] Out: 1200 [Urine:1200]     Consultants:   Urology, nephrology  Procedures: Ureteral stent.  Antimicrobials:   Cefepime.  Subjective: Pt reported abdominal pain resolved.  Later, had brief episode of chest pain that also resolved.  Pt reported having some dyspnea that's been present for several days.  Cr improving.   Objective: Vitals:   12/31/20 0501 12/31/20 0803 12/31/20 1153 12/31/20 1200  BP: 112/71 116/83 111/71   Pulse:  (!) 104 (!) 125 100  Resp:  14 20   Temp:   98.3 F (36.8 C)   TempSrc:   Oral   SpO2:  93% 95%   Weight:      Height:        Intake/Output Summary (Last 24 hours) at 12/31/2020 1651 Last data filed at 12/31/2020 1155 Gross per 24 hour  Intake 2799.79 ml  Output 4050 ml  Net -1250.21 ml   Filed Weights   12/29/20 0400 12/29/20 0754 12/31/20 0300  Weight: 94.7 kg 96.3 kg 102.2 kg    Examination:  Constitutional: NAD, AAOx3 HEENT: conjunctivae and lids normal, EOMI CV: No cyanosis.   RESP: normal respiratory effort, on RA Extremities: No effusions, edema in BLE SKIN: warm, dry Neuro: II - XII grossly intact.   Psych: Normal mood and affect.  Appropriate judgement and reason  Foley present   Data Reviewed: I have personally reviewed following labs and imaging studies  CBC: Recent Labs  Lab 12/27/20 1841 12/28/20 0502 12/29/20 0456 12/30/20 0341 12/31/20 0526  WBC 12.3* 11.7* 9.8 12.9* 12.5*  NEUTROABS 10.9*  --  8.7* 10.6*  --   HGB 13.7 11.5* 10.8* 10.7* 12.3  HCT 41.5 34.3* 32.0* 32.0* 37.1  MCV 79.2* 78.1* 78.4* 76.9* 78.6*  PLT 237 216 196 242 342   Basic Metabolic Panel: Recent Labs  Lab 12/27/20 1841 12/27/20 2039 12/28/20 0502 12/28/20 1504 12/29/20 0456 12/30/20 0341 12/30/20 1503 12/31/20 0526  NA 137  --  136  --  131* 138  --  139  K 2.9*  --  3.0* 3.1* 3.3* 2.7* 3.0* 3.6  CL 100  --  105  --  98 98  --  99  CO2 21*  --  16*  --  21* 27  --  25  GLUCOSE 441*  --  295*  --  543* 133*  --  119*  BUN 73*  --  80*  --  87* 88*  --  80*  CREATININE 6.56*  --  6.81*  --  7.31* 6.31*  --  4.95*  CALCIUM 8.3*  --  7.9*   --  7.0* 6.8*  --  7.7*  MG 2.2 2.1  --  1.9  --  1.6*  --  2.4  PHOS  --   --   --   --  4.5  4.5 2.5  --  3.7   GFR: Estimated Creatinine Clearance: 13.2 mL/min (A) (by C-G formula based on SCr of 4.95 mg/dL (H)). Liver Function Tests: Recent Labs  Lab 12/27/20 1841 12/29/20 0456 12/30/20 0341  AST 38 25 23  ALT 21 20 14   ALKPHOS 58 55 53  BILITOT 0.7 0.5 0.6  PROT 7.9 6.0* 5.8*  ALBUMIN 3.3* 2.4* 2.2*   No results for input(s): LIPASE, AMYLASE in the last 168 hours. No results for input(s): AMMONIA in  the last 168 hours. Coagulation Profile: Recent Labs  Lab 12/27/20 1841  INR 1.2   Cardiac Enzymes: No results for input(s): CKTOTAL, CKMB, CKMBINDEX, TROPONINI in the last 168 hours. BNP (last 3 results) No results for input(s): PROBNP in the last 8760 hours. HbA1C: No results for input(s): HGBA1C in the last 72 hours. CBG: Recent Labs  Lab 12/30/20 2033 12/30/20 2329 12/31/20 0452 12/31/20 0827 12/31/20 1154  GLUCAP 214* 146* 116* 179* 270*   Lipid Profile: No results for input(s): CHOL, HDL, LDLCALC, TRIG, CHOLHDL, LDLDIRECT in the last 72 hours. Thyroid Function Tests: No results for input(s): TSH, T4TOTAL, FREET4, T3FREE, THYROIDAB in the last 72 hours. Anemia Panel: Recent Labs    12/29/20 0456  TIBC 181*  IRON 20*   Sepsis Labs: Recent Labs  Lab 12/28/20 1824 12/28/20 1957  LATICACIDVEN 1.3 1.1    Recent Results (from the past 240 hour(s))  Resp Panel by RT-PCR (Flu A&B, Covid) Nasopharyngeal Swab     Status: None   Collection Time: 12/27/20  6:48 PM   Specimen: Nasopharyngeal Swab; Nasopharyngeal(NP) swabs in vial transport medium  Result Value Ref Range Status   SARS Coronavirus 2 by RT PCR NEGATIVE NEGATIVE Final    Comment: (NOTE) SARS-CoV-2 target nucleic acids are NOT DETECTED.  The SARS-CoV-2 RNA is generally detectable in upper respiratory specimens during the acute phase of infection. The lowest concentration of SARS-CoV-2  viral copies this assay can detect is 138 copies/mL. A negative result does not preclude SARS-Cov-2 infection and should not be used as the sole basis for treatment or other patient management decisions. A negative result may occur with  improper specimen collection/handling, submission of specimen other than nasopharyngeal swab, presence of viral mutation(s) within the areas targeted by this assay, and inadequate number of viral copies(<138 copies/mL). A negative result must be combined with clinical observations, patient history, and epidemiological information. The expected result is Negative.  Fact Sheet for Patients:  BloggerCourse.com  Fact Sheet for Healthcare Providers:  SeriousBroker.it  This test is no t yet approved or cleared by the [place] FDA and  has been authorized for detection and/or diagnosis of SARS-CoV-2 by FDA under an Emergency Use Authorization (EUA). This EUA will remain  in effect (meaning this test can be used) for the duration of the COVID-19 declaration under Section 564(b)(1) of the Act, 21 U.S.C.section 360bbb-3(b)(1), unless the authorization is terminated  or revoked sooner.       Influenza A by PCR NEGATIVE NEGATIVE Final   Influenza B by PCR NEGATIVE NEGATIVE Final    Comment: (NOTE) The Xpert Xpress SARS-CoV-2/FLU/RSV plus assay is intended as an aid in the diagnosis of influenza from Nasopharyngeal swab specimens and should not be used as a sole basis for treatment. Nasal washings and aspirates are unacceptable for Xpert Xpress SARS-CoV-2/FLU/RSV testing.  Fact Sheet for Patients: BloggerCourse.com  Fact Sheet for Healthcare Providers: SeriousBroker.it  This test is not yet approved or cleared by the [place] FDA and has been authorized for detection and/or diagnosis of SARS-CoV-2 by FDA under an Emergency Use Authorization  (EUA). This EUA will remain in effect (meaning this test can be used) for the duration of the COVID-19 declaration under Section 564(b)(1) of the Act, 21 U.S.C. section 360bbb-3(b)(1), unless the authorization is terminated or revoked.  Performed at [facility], [address] [place], [place] [address]   Gastrointestinal Panel by PCR , Stool     Status: None   Collection Time: 12/27/20  8:39 PM   Specimen: Stool  Result Value Ref Range Status   Campylobacter species NOT DETECTED NOT DETECTED Final   Plesimonas shigelloides NOT DETECTED NOT DETECTED Final   Salmonella species NOT DETECTED NOT DETECTED Final   Yersinia enterocolitica NOT DETECTED NOT DETECTED Final   Vibrio species NOT DETECTED NOT DETECTED Final   Vibrio cholerae NOT DETECTED NOT DETECTED Final   Enteroaggregative E coli (EAEC) NOT DETECTED NOT DETECTED Final   Enteropathogenic E coli (EPEC) NOT DETECTED NOT DETECTED Final   Enterotoxigenic E coli (ETEC) NOT DETECTED NOT DETECTED Final   Shiga like toxin producing E coli (STEC) NOT DETECTED NOT DETECTED Final   Shigella/Enteroinvasive E coli (EIEC) NOT DETECTED NOT DETECTED Final   Cryptosporidium NOT DETECTED NOT DETECTED Final   Cyclospora cayetanensis NOT DETECTED NOT DETECTED Final   Entamoeba histolytica NOT DETECTED NOT DETECTED Final   Giardia lamblia NOT DETECTED NOT DETECTED Final   Adenovirus F40/41 NOT DETECTED NOT DETECTED Final   Astrovirus NOT DETECTED NOT DETECTED Final   Norovirus GI/GII NOT DETECTED NOT DETECTED Final   Rotavirus A NOT DETECTED NOT DETECTED Final   Sapovirus (I, II, IV, and V) NOT DETECTED NOT DETECTED Final    Comment: Performed at [facility], [address] [place], [place] [address]  C Difficile Quick Screen w PCR reflex     Status: None   Collection Time: 12/27/20  8:39 PM   Specimen: Stool  Result Value Ref Range Status   C Diff antigen NEGATIVE NEGATIVE Final   C Diff toxin NEGATIVE NEGATIVE Final    C Diff interpretation No C. difficile detected.  Final    Comment: Performed at [facility], [address] [place], [place] [address]  Urine Culture     Status: Abnormal   Collection Time: 12/28/20  6:02 PM   Specimen: PATH Cytology Urine  Result Value Ref Range Status   Specimen Description   Final    URINE, RANDOM Performed at [facility], [address] [place], [place] [address]    Special Requests   Final    CYTO URINE Performed at [facility], [address] [place], [place] [address]    Culture >=100,000 COLONIES/mL ESCHERICHIA COLI (A)  Final   Report Status 12/31/2020 FINAL  Final   Organism ID, Bacteria ESCHERICHIA COLI (A)  Final      Susceptibility   Escherichia coli - MIC*    AMPICILLIN >=32 RESISTANT Resistant     CEFAZOLIN <=4 SENSITIVE Sensitive     CEFEPIME <=0.12 SENSITIVE Sensitive     CEFTRIAXONE <=0.25 SENSITIVE Sensitive     CIPROFLOXACIN <=0.25 SENSITIVE Sensitive     GENTAMICIN <=1 SENSITIVE Sensitive     IMIPENEM <=0.25 SENSITIVE Sensitive     NITROFURANTOIN <=16 SENSITIVE Sensitive     TRIMETH/SULFA <=20 SENSITIVE Sensitive     AMPICILLIN/SULBACTAM >=32 RESISTANT Resistant     PIP/TAZO <=4 SENSITIVE Sensitive     * >=100,000 COLONIES/mL ESCHERICHIA COLI  Urine culture     Status: Abnormal   Collection Time: 12/28/20  6:10 PM   Specimen: Urine, Random  Result Value Ref Range Status   Specimen Description   Final    URINE, RANDOM Performed at [facility], [address], [place], [place] [address]    Special Requests   Final    RIGHT RENAL PELVIS URINE CULTURE Performed at [facility], [address] [place], [place] [address]    Culture >=100,000 COLONIES/mL  ESCHERICHIA COLI (A)  Final   Report Status 12/31/2020 FINAL  Final   Organism ID, Bacteria ESCHERICHIA COLI (A)  Final      Susceptibility   Escherichia coli - MIC*    AMPICILLIN >=32 RESISTANT Resistant     CEFAZOLIN <=4  SENSITIVE Sensitive     CEFEPIME <=0.12 SENSITIVE Sensitive     CEFTRIAXONE <=0.25 SENSITIVE Sensitive     CIPROFLOXACIN <=0.25 SENSITIVE Sensitive     GENTAMICIN <=1 SENSITIVE Sensitive     IMIPENEM <=0.25 SENSITIVE Sensitive     NITROFURANTOIN <=16 SENSITIVE Sensitive     TRIMETH/SULFA <=20 SENSITIVE Sensitive     AMPICILLIN/SULBACTAM >=32 RESISTANT Resistant     PIP/TAZO 8 SENSITIVE Sensitive     * >=100,000 COLONIES/mL ESCHERICHIA COLI  CULTURE, BLOOD (ROUTINE X 2) w Reflex to ID Panel     Status: None (Preliminary result)   Collection Time: 12/28/20  7:10 PM   Specimen: BLOOD  Result Value Ref Range Status   Specimen Description BLOOD BLOOD RIGHT HAND  Final   Special Requests   Final    BOTTLES DRAWN AEROBIC AND ANAEROBIC Blood Culture adequate volume   Culture   Final    NO GROWTH 3 DAYS Performed at [facility], [address], [place], [place] [address]    Report Status PENDING  Incomplete  CULTURE, BLOOD (ROUTINE X 2) w Reflex to ID Panel     Status: None (Preliminary result)   Collection Time: 12/28/20  7:46 PM   Specimen: BLOOD  Result Value Ref Range Status   Specimen Description BLOOD BLOOD LEFT HAND  Final   Special Requests   Final    BOTTLES DRAWN AEROBIC AND ANAEROBIC Blood Culture adequate volume   Culture   Final    NO GROWTH 3 DAYS Performed at [facility], [address], [place], [place] [address]    Report Status PENDING  Incomplete         Radiology Studies: DG Abd 1 View  Result Date: 12/30/2020 CLINICAL DATA:  Abdominal pain with nausea and vomiting EXAM: ABDOMEN - 1 VIEW COMPARISON:  January 22, 2019 abdominal radiograph; CT abdomen and pelvis December 28, 2020 FINDINGS: The previously noted calculus at the right ureteropelvic junction is no longer evident. There is a double-J stent extending from the expected location of the right renal pelvis to the bladder. There is mild stool volume in the colon. There is no bowel dilatation  or air-fluid level to suggest bowel obstruction. No free air. Visualized lung bases are clear. IMPRESSION: Double-J stent now present on the right. Previously noted calculus at the right ureteropelvic junction not seen currently. No bowel obstruction or free air.  Visualized lung bases clear. Electronically Signed   By: Bretta Bang III M.D.   On: 12/30/2020 14:35        Scheduled Meds: . atorvastatin  40 mg Oral QHS  . brimonidine  1 drop Both Eyes BID   And  . timolol  1 drop Both Eyes BID  . Chlorhexidine Gluconate Cloth  6 each Topical Daily  . diltiazem  45 mg Oral Q6H  . feeding supplement  1 Container Oral TID BM  . insulin aspart  0-20 Units Subcutaneous Q4H  . insulin glargine  20 Units Subcutaneous Daily  . iron polysaccharides  150 mg Oral Daily  . latanoprost  1 drop Left Eye QHS  . magnesium oxide  400 mg Oral Daily  . pantoprazole  40 mg Intravenous Q12H  .  tamsulosin  0.4 mg Oral Daily   Continuous Infusions: .  ceFAZolin (ANCEF) IV       LOS: 4 days     Darlin Priestly, MD [facility]   To contact the attending provider between 7A-7P or the covering provider during after hours 7P-7A, please log into the web site [email or web address] and access using universal [place] password for that web site. If you do not have the password, please call the hospital operator.  12/31/2020, 4:51 PM

## 2021-01-01 ENCOUNTER — Encounter: Payer: Self-pay | Admitting: Anesthesiology

## 2021-01-01 DIAGNOSIS — K922 Gastrointestinal hemorrhage, unspecified: Secondary | ICD-10-CM | POA: Diagnosis not present

## 2021-01-01 DIAGNOSIS — N1330 Unspecified hydronephrosis: Secondary | ICD-10-CM | POA: Diagnosis not present

## 2021-01-01 DIAGNOSIS — N10 Acute tubulo-interstitial nephritis: Secondary | ICD-10-CM | POA: Diagnosis not present

## 2021-01-01 LAB — GLUCOSE, CAPILLARY
Glucose-Capillary: 194 mg/dL — ABNORMAL HIGH (ref 70–99)
Glucose-Capillary: 196 mg/dL — ABNORMAL HIGH (ref 70–99)
Glucose-Capillary: 185 mg/dL — ABNORMAL HIGH (ref 70–99)
Glucose-Capillary: 187 mg/dL — ABNORMAL HIGH (ref 70–99)
Glucose-Capillary: 244 mg/dL — ABNORMAL HIGH (ref 70–99)

## 2021-01-01 LAB — BASIC METABOLIC PANEL
Anion gap: 10 (ref 5–15)
BUN: 68 mg/dL — ABNORMAL HIGH (ref 8–23)
CO2: 31 mmol/L (ref 22–32)
Calcium: 7.7 mg/dL — ABNORMAL LOW (ref 8.9–10.3)
Chloride: 98 mmol/L (ref 98–111)
Creatinine, Ser: 3.45 mg/dL — ABNORMAL HIGH (ref 0.44–1.00)
GFR, Estimated: 14 mL/min — ABNORMAL LOW (ref >60)
Glucose, Bld: 196 mg/dL — ABNORMAL HIGH (ref 70–99)
Potassium: 3.5 mmol/L (ref 3.5–5.1)
Sodium: 139 mmol/L (ref 135–145)

## 2021-01-01 LAB — CBC
HCT: 32.3 % — ABNORMAL LOW (ref 36.0–46.0)
Hemoglobin: 10.8 g/dL — ABNORMAL LOW (ref 12.0–15.0)
MCH: 26.3 pg (ref 26.0–34.0)
MCHC: 33.4 g/dL (ref 30.0–36.0)
MCV: 78.8 fL — ABNORMAL LOW (ref 80.0–100.0)
Platelets: 409 10*3/uL — ABNORMAL HIGH (ref 150–400)
RBC: 4.1 MIL/uL (ref 3.87–5.11)
RDW: 16.4 % — ABNORMAL HIGH (ref 11.5–15.5)
WBC: 13.1 10*3/uL — ABNORMAL HIGH (ref 4.0–10.5)
nRBC: 0 % (ref 0.0–0.2)

## 2021-01-01 LAB — MAGNESIUM: Magnesium: 1.8 mg/dL (ref 1.7–2.4)

## 2021-01-01 LAB — PROTEIN ELECTRO, RANDOM URINE
Albumin ELP, Urine: 33.9 %
Alpha-1-Globulin, U: 6.5 %
Alpha-2-Globulin, U: 17.1 %
Beta Globulin, U: 26.6 %
Gamma Globulin, U: 15.8 %
Total Protein, Urine: 35.7 mg/dL

## 2021-01-01 MED ORDER — FUROSEMIDE 10 MG/ML IJ SOLN
40.0000 mg | Freq: Once | INTRAMUSCULAR | Status: AC
  Administered 2021-01-01: 40 mg via INTRAVENOUS
  Filled 2021-01-01: qty 4

## 2021-01-01 MED ORDER — FUROSEMIDE 10 MG/ML IJ SOLN: 20.0000 mg | Freq: Once | INTRAMUSCULAR | Status: DC

## 2021-01-01 MED ORDER — METOPROLOL TARTRATE 25 MG PO TABS
25.0000 mg | ORAL_TABLET | Freq: Four times a day (QID) | ORAL | Status: DC
  Administered 2021-01-01 – 2021-01-02 (×4): 25 mg via ORAL
  Filled 2021-01-01 (×5): qty 1

## 2021-01-01 NOTE — Progress Notes (Signed)
   01/01/21 0935  Assess: MEWS Score  Temp 98.4 F (36.9 C)  BP 131/89  Pulse Rate (!) 107  ECG Heart Rate (!) 133  Resp (!) 25  Level of Consciousness Alert  SpO2 92 %  O2 Device Room Air  Assess: MEWS Score  MEWS Temp 0  MEWS Systolic 0  MEWS Pulse 3  MEWS RR 1  MEWS LOC 0  MEWS Score 4  MEWS Score Color Red  Assess: if the MEWS score is Yellow or Red  Were vital signs taken at a resting state? Yes  Focused Assessment No change from prior assessment  Early Detection of Sepsis Score *See Row Information* Medium  MEWS guidelines implemented *See Row Information* Yes  Treat  MEWS Interventions Administered scheduled meds/treatments;Escalated (See documentation below)  Pain Scale 0-10  Pain Score 0  Take Vital Signs  Increase Vital Sign Frequency  Red: Q 1hr X 4 then Q 4hr X 4, if remains red, continue Q 4hrs  Escalate  MEWS: Escalate Red: discuss with charge nurse/RN and provider, consider discussing with RRT  Notify: Charge Nurse/RN  Name of Charge Nurse/RN Notified Josephina Shih  Date Charge Nurse/RN Notified 01/01/21  Time Charge Nurse/RN Notified 0940  Notify: Provider  Provider Name/Title Dr Mariah Milling (Dr Kirke Corin)  Date Provider Notified 01/01/21  Time Provider Notified 1002  Notification Type  (Secure chat)  Notification Reason Other (Comment)  Provider response See new orders  Notify: Rapid Response  Name of Rapid Response RN Notified Adron Bene RN  Date Rapid Response Notified 01/01/21  Time Rapid Response Notified 0945  Document  Patient Outcome Not stable and remains on department   Pt scheduled for EGD. Endoscopy nurse Verlon Au updated on patient status. RR nurse paged and at bedside with primary RN. See new orders.

## 2021-01-01 NOTE — Progress Notes (Signed)
Mobility Specialist - Progress Note   01/01/21 1400  Mobility  Activity Contraindicated/medical hold  Mobility performed by Mobility specialist    Per discussion with nurse, hold mobility this date d/t HR. Will attempt session at another date.    Filiberto Pinks Mobility Specialist 01/01/21, 2:52 PM

## 2021-01-01 NOTE — Progress Notes (Signed)
Progress Note  Patient Name: Cynthia Lucas Date of Encounter: 01/01/2021  Primary Cardiologist: New to [facility] - consult by Kirke Corin  Subjective   She remains in Afib with RVR with ventricular rates in the 90s to low 100s bpm overnight and into the 110s to 120s bpm this morning. She has tolerated escalation of diltiazem. BP is improved and on the high side more recently. GI planning for EGD. Renal function continues to improve from 4.95 to 3.45 over the past 24 hours. HGB 12.3-->10.8 over the past 24 hours.   Inpatient Medications    Scheduled Meds: . atorvastatin  40 mg Oral QHS  . brimonidine  1 drop Both Eyes BID   And  . timolol  1 drop Both Eyes BID  . Chlorhexidine Gluconate Cloth  6 each Topical Daily  . diltiazem  45 mg Oral Q6H  . feeding supplement  1 Container Oral TID BM  . furosemide  40 mg Intravenous Once  . insulin aspart  0-20 Units Subcutaneous TID WC  . insulin glargine  20 Units Subcutaneous Daily  . iron polysaccharides  150 mg Oral Daily  . latanoprost  1 drop Left Eye QHS  . magnesium oxide  400 mg Oral Daily  . metoprolol tartrate  25 mg Oral Q6H  . pantoprazole  40 mg Intravenous Q12H  . tamsulosin  0.4 mg Oral Daily   Continuous Infusions: .  ceFAZolin (ANCEF) IV 1 g (12/31/20 2120)   PRN Meds: acetaminophen **OR** acetaminophen, HYDROcodone-acetaminophen, ondansetron **OR** ondansetron (ZOFRAN) IV, traZODone   Vital Signs    Vitals:   01/01/21 0532 01/01/21 0700 01/01/21 0935 01/01/21 0941  BP: (!) 149/113 (!) 144/92 131/89   Pulse:   (!) 107 (!) 118  Resp:   (!) 25   Temp:      TempSrc:      SpO2:  90% 92%   Weight:      Height:        Intake/Output Summary (Last 24 hours) at 01/01/2021 0942 Last data filed at 01/01/2021 0300 Gross per 24 hour  Intake 890 ml  Output 3150 ml  Net -2260 ml   Filed Weights   12/29/20 0400 12/29/20 0754 12/31/20 0300  Weight: 94.7 kg 96.3 kg 102.2 kg    Telemetry    A. fib with ventricular rates in  the 90s to low 100s bpm overnight and into the 120s bpm this morning - Personally Reviewed  ECG    No new tracings - Personally Reviewed  Physical Exam   GEN: No acute distress.   Neck: No JVD. Cardiac: Tachycardic, IRIR, no murmurs, rubs, or gallops.  Respiratory: Clear to auscultation bilaterally.  GI: Soft, nontender, non-distended.   MS: No edema; No deformity. Neuro:  Alert and oriented x 3; Nonfocal.  Psych: Normal affect.  Labs    Chemistry Recent Labs  Lab 12/27/20 1841 12/28/20 0502 12/29/20 0456 12/30/20 0341 12/30/20 1503 12/31/20 0526 01/01/21 0539  NA 137   < > 131* 138  --  139 139  K 2.9*   < > 3.3* 2.7* 3.0* 3.6 3.5  CL 100   < > 98 98  --  99 98  CO2 21*   < > 21* 27  --  25 31  GLUCOSE 441*   < > 543* 133*  --  119* 196*  BUN 73*   < > 87* 88*  --  80* 68*  CREATININE 6.56*   < > 7.31* 6.31*  --  4.95* 3.45*  CALCIUM 8.3*   < > 7.0* 6.8*  --  7.7* 7.7*  PROT 7.9  --  6.0* 5.8*  --   --   --   ALBUMIN 3.3*  --  2.4* 2.2*  --   --   --   AST 38  --  25 23  --   --   --   ALT 21  --  20 14  --   --   --   ALKPHOS 58  --  55 53  --   --   --   BILITOT 0.7  --  0.5 0.6  --   --   --   GFRNONAA 6*   < > 6* 7*  --  9* 14*  ANIONGAP 16*   < > 12 13  --  15 10   < > = values in this interval not displayed.     Hematology Recent Labs  Lab 12/30/20 0341 12/31/20 0526 01/01/21 0539  WBC 12.9* 12.5* 13.1*  RBC 4.16 4.72 4.10  HGB 10.7* 12.3 10.8*  HCT 32.0* 37.1 32.3*  MCV 76.9* 78.6* 78.8*  MCH 25.7* 26.1 26.3  MCHC 33.4 33.2 33.4  RDW 15.7* 16.2* 16.4*  PLT 242 342 409*    Cardiac EnzymesNo results for input(s): TROPONINI in the last 168 hours. No results for input(s): TROPIPOC in the last 168 hours.   BNP Recent Labs  Lab 12/27/20 1841  BNP 530.7*     DDimer No results for input(s): DDIMER in the last 168 hours.   Radiology    DG Abd 1 View  Result Date: 12/30/2020 IMPRESSION: Double-J stent now present on the right. Previously  noted calculus at the right ureteropelvic junction not seen currently. No bowel obstruction or free air.  Visualized lung bases clear. Electronically Signed   By: Bretta Bang III M.D.   On: 12/30/2020 14:35    Cardiac Studies   2D echo 12/29/2020: 1. Left ventricular ejection fraction, by estimation, is 55 to 60%. The  left ventricle has normal function. The left ventricle has no regional  wall motion abnormalities. There is mild left ventricular hypertrophy.  Left ventricular diastolic function  could not be evaluated.  2. Right ventricular systolic function is normal. The right ventricular  size is normal. There is normal pulmonary artery systolic pressure.  3. Left atrial size was moderately dilated.  4. The mitral valve is normal in structure. Mild to moderate mitral valve  regurgitation. No evidence of mitral stenosis.  5. The aortic valve is normal in structure. Aortic valve regurgitation is  mild. Mild aortic valve sclerosis is present, with no evidence of aortic  valve stenosis.  6. Aortic dilatation noted. There is mild dilatation of the ascending  aorta, measuring 41 mm.  7. Only parasternal views.   Patient Profile     70 y.o. female with history of DM2, essential tremor, sleep apnea, HTN, and GERD who was admitted with abdominal pain and coffee-ground emesis with mild anemia and found to have acute renal failure in the setting of obstructive uropathy and pyelonephritis who we are seeing for new onset A. fib with RVR of unknown chronicity.  Assessment & Plan    1.  New onset A. fib with RVR: -Chronicity remains uncertain at this time -Atrial arrhythmia diagnosed in the setting of acute renal failure in the setting of possible obstructive uropathy with pyelonephritis and suspected GI bleed -Overall it appears her ventricular rates have been better  controlled on diltiazem drip -With improved BP, we will add Lopressor 25 mg q 6 hours after discussion with  MD -Continue diltiazem 45 mg q 6 hours -Unable to use digoxin secondary to acute renal failure  -Amiodarone for rate control is not ideal either given that she has not been adequately anticoagulated -CHA2DS2-VASc of 4, however anticoagulation has been deferred at the present time due to suspected GI bleed and acute renal failure.  Once these issues improve anticoagulation would be indicated -TSH normal -Will give a one-time dose of IV Lasix this morning after discussion with MD  2.  Elevated troponin: -Possibly supply demand ischemia in the setting of A. fib with RVR in the context of acute renal failure and suspected GI bleed -Echo this admission showed normal LV systolic function and wall motion -Consider outpatient Lexiscan MPI or coronary CTA  3.  ARF: -Felt to be secondary to obstructive uropathy with pyelonephritis -Renal function is improving -Nephrology is following  4. Suspected GI bleed: -Hemoglobin has down trended over the past 24 hours -GI is planning for EGD  5.  Preoperative cardiac risk stratification: -Patient is needing to undergo EGD for evaluation of suspected GI bleed -Per revised cardiac index she is low risk for noncardiac procedure -At baseline, prior to admission she is able to achieve greater than 4 METs without cardiac limitation -Echo this admission showed normal EF with normal wall motion -Patient may proceed at an overall low risk without further cardiac testing   For questions or updates, please contact [facility] Please consult [email or web address] for contact info under Cardiology/STEMI.    Signed, Eula Listen, PA-C [facility] Pager: [phone] 01/01/2021, 9:42 AM

## 2021-01-01 NOTE — Progress Notes (Signed)
Went to see patient per request from patient's nurse in regards to heart rate and elevated MEWS score (especially in relation to scheduled endoscopy procedure). Patient's nurse was at bedside administering ordered metoprolol when this RN arrived. Patient was alert and reported not feeling any dizziness, palpitations, or shortness of breath. Eula Listen PA had already seen the morning. Endoscopy notified of patient's condition this morning.

## 2021-01-01 NOTE — Progress Notes (Signed)
PROGRESS NOTE    Cynthia Lucas  KVQ:[id number] DOB: [birth year] DOA: 12/27/2020 PCP: Toy Cookey, FNP   Follow-up pyelonephritis. Brief Narrative:  Cynthia Lucas a 70 y.o.African-American female femalewith medical history significant forhypertension, GERD, type 2 diabetes mellitus and sleep apnea as well as head tremors, who presented to the emergency room with acute onset of recurrent nausea and vomiting with diarrhea. She admits to coffee-ground emesis and denied any bright red bleeding per rectum or melena.  In the emergency room, she was found to have atrial fibrillation with rapid ventricular response, she also had acute renal failure with creatinine 6.56.  She is placed on IV fluids, consult from cardiology and GI was obtained from the emergency room. 3/28.  Patient ultrasound showed moderate hydronephrosis.  Ureteral stent was placed by urology, during the process, patient had pus drained from the kidney, consistent with acute pyelonephritis.  Cefepime was started.   Assessment & Plan:   Active Problems:   GI bleeding   Acute renal failure syndrome (HCC)   Metabolic acidosis   Hypokalemia   Atrial fibrillation with rapid ventricular response (HCC)   Uncontrolled type 2 diabetes mellitus with hyperglycemia, without long-term current use of insulin (HCC)   Sepsis (HCC)   Acute pyelonephritis   Hydronephrosis of right kidney   Kidney stone on right side  #1. Severe sepsis secondary to acute pyelonephritis. --on presentation, had severe tachycardia, tachypnea and leukocytosis.  Patient also had acute renal failure.  Pt started on cefepime, de-escalated to Ancef on 3/31 --urine cx pos for E coli --cont Ancef  # obstructing right sided kidney stone causing acute pyelonephritis and  Right hydronephrosis  S/p right stent placement --s/p MIVF for post-obstructive diuresis --IVF stopped on 3/31 due to dyspnea -Outpatient URS/LL/stent exchange with Dr. Lonna Cobb in 2 to 3  weeks  2.  Acute renal failure, POA, improved  on chronic kidney disease stage IIIa. Non anion gap metabolic acidosis. Patient renal failure is secondary to obstruction and sepsis. Patient is followed by nephrology --IVF stopped on 3/31 by nephrology due to dyspnea --cont Foley for strict I/O, per nephrology  --IV lasix 40 x1 today per nephrology  Hypokalemia --replete with oral potassium  Hypomag --replete with IV mag PRN --cont oral mag supplements  Hyponatremia, resolved  #3.  Poorly controlled type 2 diabetes with hyperglycemia. --A1c 7.9 --cont Lantus 20u daily --ACHS and SSI TID  4.  Coffee-ground emesis. Iron deficient anemia.  Borderline B12 level. Patient hemoglobin still stable at 10.8, S/p IV iron and Vit B12 injection --cont IV PPI BID --EGD when HR more controlled  5.  Acute metabolic encephalopathy. Secondary to sepsis, improved  #6. paroxysmal atrial fibrillation with rapid ventricular response. --s/p dilt gtt Plan: --oral dilt 45 mg q6h, per cardiology --add Lopressor 25 mg q6h, per cardiology --Not able to anticoagulate due to GI bleed.  Acute abdominal pain, new, resolved --onset morning of 3/30.  KUB remarkable, pt having normal BM's.  Discussed with urology, could be cramps from stent.  Started on Flomax --cont Flomax   DVT prophylaxis: SCDs Code Status: Full Family Communication:  Disposition Plan:   Status is: Inpatient  Remains inpatient appropriate because:Inpatient level of care appropriate due to severity of illness   Dispo: The patient is from: Home              Anticipated d/c is to: Home              Patient currently is not medically stable to d/c.  Still not rate controlled, EGD pending    Difficult to place patient No    I/O last 3 completed shifts: In: 3329.8 [P.O.:960; I.V.:2166.3; IV Piggyback:203.5] Out: 5650 [Urine:5650] Total I/O In: -  Out: 1401 [Urine:1400; Stool:1]     Consultants:   Urology,  nephrology  Procedures: Ureteral stent.  Antimicrobials:  Cefepime.  Subjective: GI attempted EGD this morning, however, HR not controlled, so EGD postponed.    Pt reported no more abdominal pain or chest pain.  Dyspnea improved.  Voiding and having BM's.    Objective: Vitals:   01/01/21 1141 01/01/21 1300 01/01/21 1301 01/01/21 1511  BP: 127/79 (!) 116/101 113/80 121/83  Pulse: 85 (!) 144 (!) 118 90  Resp: 15 (!) 27 17   Temp: 98.4 F (36.9 C) 98 F (36.7 C)    TempSrc: Oral Oral    SpO2: 97% 96% 94% 92%  Weight:      Height:        Intake/Output Summary (Last 24 hours) at 01/01/2021 1647 Last data filed at 01/01/2021 1350 Gross per 24 hour  Intake 530 ml  Output 3351 ml  Net -2821 ml   Filed Weights   12/29/20 0400 12/29/20 0754 12/31/20 0300  Weight: 94.7 kg 96.3 kg 102.2 kg    Examination:  Constitutional: NAD, AAOx3 HEENT: conjunctivae and lids normal, EOMI CV: No cyanosis.   RESP: normal respiratory effort, on RA Extremities: No effusions, edema in BLE SKIN: warm, dry Neuro: II - XII grossly intact.   Psych: Normal mood and affect.  Appropriate judgement and reason  Foley present   Data Reviewed: I have personally reviewed following labs and imaging studies  CBC: Recent Labs  Lab 12/27/20 1841 12/28/20 0502 12/29/20 0456 12/30/20 0341 12/31/20 0526 01/01/21 0539  WBC 12.3* 11.7* 9.8 12.9* 12.5* 13.1*  NEUTROABS 10.9*  --  8.7* 10.6*  --   --   HGB 13.7 11.5* 10.8* 10.7* 12.3 10.8*  HCT 41.5 34.3* 32.0* 32.0* 37.1 32.3*  MCV 79.2* 78.1* 78.4* 76.9* 78.6* 78.8*  PLT 237 216 196 242 342 409*   Basic Metabolic Panel: Recent Labs  Lab 12/27/20 2039 12/28/20 0502 12/28/20 1504 12/29/20 0456 12/30/20 0341 12/30/20 1503 12/31/20 0526 01/01/21 0539  NA  --  136  --  131* 138  --  139 139  K  --  3.0* 3.1* 3.3* 2.7* 3.0* 3.6 3.5  CL  --  105  --  98 98  --  99 98  CO2  --  16*  --  21* 27  --  25 31  GLUCOSE  --  295*  --  543* 133*  --   119* 196*  BUN  --  80*  --  87* 88*  --  80* 68*  CREATININE  --  6.81*  --  7.31* 6.31*  --  4.95* 3.45*  CALCIUM  --  7.9*  --  7.0* 6.8*  --  7.7* 7.7*  MG 2.1  --  1.9  --  1.6*  --  2.4 1.8  PHOS  --   --   --  4.5  4.5 2.5  --  3.7  --    GFR: Estimated Creatinine Clearance: 18.9 mL/min (A) (by C-G formula based on SCr of 3.45 mg/dL (H)). Liver Function Tests: Recent Labs  Lab 12/27/20 1841 12/29/20 0456 12/30/20 0341  AST 38 25 23  ALT 21 20 14   ALKPHOS 58 55 53  BILITOT 0.7 0.5 0.6  PROT  7.9 6.0* 5.8*  ALBUMIN 3.3* 2.4* 2.2*   No results for input(s): LIPASE, AMYLASE in the last 168 hours. No results for input(s): AMMONIA in the last 168 hours. Coagulation Profile: Recent Labs  Lab 12/27/20 1841  INR 1.2   Cardiac Enzymes: No results for input(s): CKTOTAL, CKMB, CKMBINDEX, TROPONINI in the last 168 hours. BNP (last 3 results) No results for input(s): PROBNP in the last 8760 hours. HbA1C: No results for input(s): HGBA1C in the last 72 hours. CBG: Recent Labs  Lab 12/31/20 1953 01/01/21 0749 01/01/21 0936 01/01/21 1243 01/01/21 1559  GLUCAP 267* 194* 196* 185* 187*   Lipid Profile: No results for input(s): CHOL, HDL, LDLCALC, TRIG, CHOLHDL, LDLDIRECT in the last 72 hours. Thyroid Function Tests: No results for input(s): TSH, T4TOTAL, FREET4, T3FREE, THYROIDAB in the last 72 hours. Anemia Panel: No results for input(s): VITAMINB12, FOLATE, FERRITIN, TIBC, IRON, RETICCTPCT in the last 72 hours. Sepsis Labs: Recent Labs  Lab 12/28/20 1824 12/28/20 1957  LATICACIDVEN 1.3 1.1    Recent Results (from the past 240 hour(s))  Resp Panel by RT-PCR (Flu A&B, Covid) Nasopharyngeal Swab     Status: None   Collection Time: 12/27/20  6:48 PM   Specimen: Nasopharyngeal Swab; Nasopharyngeal(NP) swabs in vial transport medium  Result Value Ref Range Status   SARS Coronavirus 2 by RT PCR NEGATIVE NEGATIVE Final    Comment: (NOTE) SARS-CoV-2 target nucleic acids  are NOT DETECTED.  The SARS-CoV-2 RNA is generally detectable in upper respiratory specimens during the acute phase of infection. The lowest concentration of SARS-CoV-2 viral copies this assay can detect is 138 copies/mL. A negative result does not preclude SARS-Cov-2 infection and should not be used as the sole basis for treatment or other patient management decisions. A negative result may occur with  improper specimen collection/handling, submission of specimen other than nasopharyngeal swab, presence of viral mutation(s) within the areas targeted by this assay, and inadequate number of viral copies(<138 copies/mL). A negative result must be combined with clinical observations, patient history, and epidemiological information. The expected result is Negative.  Fact Sheet for Patients:  BloggerCourse.com  Fact Sheet for Healthcare Providers:  SeriousBroker.it  This test is no t yet approved or cleared by the [place] FDA and  has been authorized for detection and/or diagnosis of SARS-CoV-2 by FDA under an Emergency Use Authorization (EUA). This EUA will remain  in effect (meaning this test can be used) for the duration of the COVID-19 declaration under Section 564(b)(1) of the Act, 21 U.S.C.section 360bbb-3(b)(1), unless the authorization is terminated  or revoked sooner.       Influenza A by PCR NEGATIVE NEGATIVE Final   Influenza B by PCR NEGATIVE NEGATIVE Final    Comment: (NOTE) The Xpert Xpress SARS-CoV-2/FLU/RSV plus assay is intended as an aid in the diagnosis of influenza from Nasopharyngeal swab specimens and should not be used as a sole basis for treatment. Nasal washings and aspirates are unacceptable for Xpert Xpress SARS-CoV-2/FLU/RSV testing.  Fact Sheet for Patients: BloggerCourse.com  Fact Sheet for Healthcare Providers: SeriousBroker.it  This test is  not yet approved or cleared by the [place] FDA and has been authorized for detection and/or diagnosis of SARS-CoV-2 by FDA under an Emergency Use Authorization (EUA). This EUA will remain in effect (meaning this test can be used) for the duration of the COVID-19 declaration under Section 564(b)(1) of the Act, 21 U.S.C. section 360bbb-3(b)(1), unless the authorization is terminated or revoked.  Performed at [facility], [address] [place], [place] [address]   Gastrointestinal Panel by PCR , Stool     Status: None   Collection Time: 12/27/20  8:39 PM   Specimen: Stool  Result Value Ref Range Status   Campylobacter species NOT DETECTED NOT DETECTED Final   Plesimonas shigelloides NOT DETECTED NOT DETECTED Final   Salmonella species NOT DETECTED NOT DETECTED Final   Yersinia enterocolitica NOT DETECTED NOT DETECTED Final   Vibrio species NOT DETECTED NOT DETECTED Final   Vibrio cholerae NOT DETECTED NOT DETECTED Final   Enteroaggregative E coli (EAEC) NOT DETECTED NOT DETECTED Final   Enteropathogenic E coli (EPEC) NOT DETECTED NOT DETECTED Final   Enterotoxigenic E coli (ETEC) NOT DETECTED NOT DETECTED Final   Shiga like toxin producing E coli (STEC) NOT DETECTED NOT DETECTED Final   Shigella/Enteroinvasive E coli (EIEC) NOT DETECTED NOT DETECTED Final   Cryptosporidium NOT DETECTED NOT DETECTED Final   Cyclospora cayetanensis NOT DETECTED NOT DETECTED Final   Entamoeba histolytica NOT DETECTED NOT DETECTED Final   Giardia lamblia NOT DETECTED NOT DETECTED Final   Adenovirus F40/41 NOT DETECTED NOT DETECTED Final   Astrovirus NOT DETECTED NOT DETECTED Final   Norovirus GI/GII NOT DETECTED NOT DETECTED Final   Rotavirus A NOT DETECTED NOT DETECTED Final   Sapovirus (I, II, IV, and V) NOT DETECTED NOT DETECTED Final    Comment: Performed at [facility], [address] [place], [place] [address]  C Difficile Quick Screen w PCR reflex     Status: None    Collection Time: 12/27/20  8:39 PM   Specimen: Stool  Result Value Ref Range Status   C Diff antigen NEGATIVE NEGATIVE Final   C Diff toxin NEGATIVE NEGATIVE Final   C Diff interpretation No C. difficile detected.  Final    Comment: Performed at [facility], [address] [place], [place] [address]  Urine Culture     Status: Abnormal   Collection Time: 12/28/20  6:02 PM   Specimen: PATH Cytology Urine  Result Value Ref Range Status   Specimen Description   Final    URINE, RANDOM Performed at [facility], [address] [place], [place] [address]    Special Requests   Final    CYTO URINE Performed at [facility], [address] [place], [place] [address]    Culture >=100,000 COLONIES/mL ESCHERICHIA COLI (A)  Final   Report Status 12/31/2020 FINAL  Final   Organism ID, Bacteria ESCHERICHIA COLI (A)  Final      Susceptibility   Escherichia coli - MIC*    AMPICILLIN >=32 RESISTANT Resistant     CEFAZOLIN <=4 SENSITIVE Sensitive     CEFEPIME <=0.12 SENSITIVE Sensitive     CEFTRIAXONE <=0.25 SENSITIVE Sensitive     CIPROFLOXACIN <=0.25 SENSITIVE Sensitive     GENTAMICIN <=1 SENSITIVE Sensitive     IMIPENEM <=0.25 SENSITIVE Sensitive     NITROFURANTOIN <=16 SENSITIVE Sensitive     TRIMETH/SULFA <=20 SENSITIVE Sensitive     AMPICILLIN/SULBACTAM >=32 RESISTANT Resistant     PIP/TAZO <=4 SENSITIVE Sensitive     * >=100,000 COLONIES/mL ESCHERICHIA COLI  Urine culture     Status: Abnormal   Collection Time: 12/28/20  6:10 PM   Specimen: Urine, Random  Result Value Ref Range Status   Specimen Description   Final    URINE, RANDOM Performed at [facility], [address], [place], [place] [address]    Special Requests  Final    RIGHT RENAL PELVIS URINE CULTURE Performed at [facility], [address] [place], [place] [address]    Culture >=100,000 COLONIES/mL ESCHERICHIA COLI (A)  Final   Report Status 12/31/2020 FINAL   Final   Organism ID, Bacteria ESCHERICHIA COLI (A)  Final      Susceptibility   Escherichia coli - MIC*    AMPICILLIN >=32 RESISTANT Resistant     CEFAZOLIN <=4 SENSITIVE Sensitive     CEFEPIME <=0.12 SENSITIVE Sensitive     CEFTRIAXONE <=0.25 SENSITIVE Sensitive     CIPROFLOXACIN <=0.25 SENSITIVE Sensitive     GENTAMICIN <=1 SENSITIVE Sensitive     IMIPENEM <=0.25 SENSITIVE Sensitive     NITROFURANTOIN <=16 SENSITIVE Sensitive     TRIMETH/SULFA <=20 SENSITIVE Sensitive     AMPICILLIN/SULBACTAM >=32 RESISTANT Resistant     PIP/TAZO 8 SENSITIVE Sensitive     * >=100,000 COLONIES/mL ESCHERICHIA COLI  CULTURE, BLOOD (ROUTINE X 2) w Reflex to ID Panel     Status: None (Preliminary result)   Collection Time: 12/28/20  7:10 PM   Specimen: BLOOD  Result Value Ref Range Status   Specimen Description BLOOD BLOOD RIGHT HAND  Final   Special Requests   Final    BOTTLES DRAWN AEROBIC AND ANAEROBIC Blood Culture adequate volume   Culture   Final    NO GROWTH 4 DAYS Performed at [facility], [address] [place], [place] [address]    Report Status PENDING  Incomplete  CULTURE, BLOOD (ROUTINE X 2) w Reflex to ID Panel     Status: None (Preliminary result)   Collection Time: 12/28/20  7:46 PM   Specimen: BLOOD  Result Value Ref Range Status   Specimen Description BLOOD BLOOD LEFT HAND  Final   Special Requests   Final    BOTTLES DRAWN AEROBIC AND ANAEROBIC Blood Culture adequate volume   Culture   Final    NO GROWTH 4 DAYS Performed at [facility], [address] [place], [place] [address]    Report Status PENDING  Incomplete         Radiology Studies: No results found.      Scheduled Meds: . atorvastatin  40 mg Oral QHS  . brimonidine  1 drop Both Eyes BID   And  . timolol  1 drop Both Eyes BID  . Chlorhexidine Gluconate Cloth  6 each Topical Daily  . diltiazem  45 mg Oral Q6H  . feeding supplement  1 Container Oral TID BM  . insulin aspart   0-20 Units Subcutaneous TID WC  . insulin glargine  20 Units Subcutaneous Daily  . iron polysaccharides  150 mg Oral Daily  . latanoprost  1 drop Left Eye QHS  . magnesium oxide  400 mg Oral Daily  . metoprolol tartrate  25 mg Oral Q6H  . pantoprazole  40 mg Intravenous Q12H  . tamsulosin  0.4 mg Oral Daily   Continuous Infusions: .  ceFAZolin (ANCEF) IV 1 g (01/01/21 1308)     LOS: 5 days     Darlin Priestlyina Griselda Bramblett, MD [facility]   To contact the attending provider between 7A-7P or the covering provider during after hours 7P-7A, please log into the web site [email or web address] and access using universal [place] password for that web site. If you do not have the password, please call the hospital operator.  01/01/2021, 4:47 PM

## 2021-01-01 NOTE — Progress Notes (Signed)
[facility]  ROUNDING NOTE   Subjective:   Cynthia Lucas is a 70 y.o. female with past medical history of chronic cough, diabetes mellitus, GERD, hypertension and sleep apnea. She presents to the ED with complaints of weakness and tachycardia.   She is admitted for AKI   Patient is seen resting in bed Alert and oriented Poor appetite but also does not like food Denies nausea Say her shortness of breath has improved  Foley 3.15L  Objective:  Vital signs in last 24 hours:  Temp:  [98 F (36.7 C)-98.6 F (37 C)] 98 F (36.7 C) (04/01 1300) Pulse Rate:  [72-144] 118 (04/01 1301) Resp:  [15-27] 17 (04/01 1301) BP: (113-149)/(73-113) 113/80 (04/01 1301) SpO2:  [90 %-97 %] 94 % (04/01 1301)  Weight change:  Filed Weights   12/29/20 0400 12/29/20 0754 12/31/20 0300  Weight: 94.7 kg 96.3 kg 102.2 kg    Intake/Output: I/O last 3 completed shifts: In: 3329.8 [P.O.:960; I.V.:2166.3; IV Piggyback:203.5] Out: 5650 [Urine:5650]   Intake/Output this shift:  Total I/O In: -  Out: 1401 [Urine:1400; Stool:1]  Physical Exam: General: NAD  Head: Normocephalic, atraumatic. Moist oral mucosal membranes  Eyes: Anicteric, PERRL  Neck: Supple, trachea midline  Lungs:   wheezes  Heart: Irregular rate and rhythm  Abdomen:  Soft, nontender,   Extremities:  min peripheral edema.  Neurologic: Nonfocal, moving all four extremities  Skin: No lesions       Basic Metabolic Panel: Recent Labs  Lab 12/27/20 2039 12/28/20 0502 12/28/20 1504 12/29/20 0456 12/30/20 0341 12/30/20 1503 12/31/20 0526 01/01/21 0539  NA  --  136  --  131* 138  --  139 139  K  --  3.0* 3.1* 3.3* 2.7* 3.0* 3.6 3.5  CL  --  105  --  98 98  --  99 98  CO2  --  16*  --  21* 27  --  25 31  GLUCOSE  --  295*  --  543* 133*  --  119* 196*  BUN  --  80*  --  87* 88*  --  80* 68*  CREATININE  --  6.81*  --  7.31* 6.31*  --  4.95* 3.45*  CALCIUM  --  7.9*  --  7.0* 6.8*  --  7.7* 7.7*  MG 2.1  --   1.9  --  1.6*  --  2.4 1.8  PHOS  --   --   --  4.5  4.5 2.5  --  3.7  --     Liver Function Tests: Recent Labs  Lab 12/27/20 1841 12/29/20 0456 12/30/20 0341  AST 38 25 23  ALT 21 20 14   ALKPHOS 58 55 53  BILITOT 0.7 0.5 0.6  PROT 7.9 6.0* 5.8*  ALBUMIN 3.3* 2.4* 2.2*   No results for input(s): LIPASE, AMYLASE in the last 168 hours. No results for input(s): AMMONIA in the last 168 hours.  CBC: Recent Labs  Lab 12/27/20 1841 12/28/20 0502 12/29/20 0456 12/30/20 0341 12/31/20 0526 01/01/21 0539  WBC 12.3* 11.7* 9.8 12.9* 12.5* 13.1*  NEUTROABS 10.9*  --  8.7* 10.6*  --   --   HGB 13.7 11.5* 10.8* 10.7* 12.3 10.8*  HCT 41.5 34.3* 32.0* 32.0* 37.1 32.3*  MCV 79.2* 78.1* 78.4* 76.9* 78.6* 78.8*  PLT 237 216 196 242 342 409*    Cardiac Enzymes: No results for input(s): CKTOTAL, CKMB, CKMBINDEX, TROPONINI in the last 168 hours.  BNP: Invalid input(s): POCBNP  CBG: Recent Labs  Lab 12/31/20 1807 12/31/20 1953 01/01/21 0749 01/01/21 0936 01/01/21 1243  GLUCAP 260* 267* 194* 196* 185*    Microbiology: Results for orders placed or performed during the hospital encounter of 12/27/20  Resp Panel by RT-PCR (Flu A&B, Covid) Nasopharyngeal Swab     Status: None   Collection Time: 12/27/20  6:48 PM   Specimen: Nasopharyngeal Swab; Nasopharyngeal(NP) swabs in vial transport medium  Result Value Ref Range Status   SARS Coronavirus 2 by RT PCR NEGATIVE NEGATIVE Final    Comment: (NOTE) SARS-CoV-2 target nucleic acids are NOT DETECTED.  The SARS-CoV-2 RNA is generally detectable in upper respiratory specimens during the acute phase of infection. The lowest concentration of SARS-CoV-2 viral copies this assay can detect is 138 copies/mL. A negative result does not preclude SARS-Cov-2 infection and should not be used as the sole basis for treatment or other patient management decisions. A negative result may occur with  improper specimen collection/handling, submission  of specimen other than nasopharyngeal swab, presence of viral mutation(s) within the areas targeted by this assay, and inadequate number of viral copies(<138 copies/mL). A negative result must be combined with clinical observations, patient history, and epidemiological information. The expected result is Negative.  Fact Sheet for Patients:  BloggerCourse.comhttps://www.fda.gov/media/152166/download  Fact Sheet for Healthcare Providers:  SeriousBroker.ithttps://www.fda.gov/media/152162/download  This test is no t yet approved or cleared by the [place] FDA and  has been authorized for detection and/or diagnosis of SARS-CoV-2 by FDA under an Emergency Use Authorization (EUA). This EUA will remain  in effect (meaning this test can be used) for the duration of the COVID-19 declaration under Section 564(b)(1) of the Act, 21 U.S.C.section 360bbb-3(b)(1), unless the authorization is terminated  or revoked sooner.       Influenza A by PCR NEGATIVE NEGATIVE Final   Influenza B by PCR NEGATIVE NEGATIVE Final    Comment: (NOTE) The Xpert Xpress SARS-CoV-2/FLU/RSV plus assay is intended as an aid in the diagnosis of influenza from Nasopharyngeal swab specimens and should not be used as a sole basis for treatment. Nasal washings and aspirates are unacceptable for Xpert Xpress SARS-CoV-2/FLU/RSV testing.  Fact Sheet for Patients: BloggerCourse.comhttps://www.fda.gov/media/152166/download  Fact Sheet for Healthcare Providers: SeriousBroker.ithttps://www.fda.gov/media/152162/download  This test is not yet approved or cleared by the [place] FDA and has been authorized for detection and/or diagnosis of SARS-CoV-2 by FDA under an Emergency Use Authorization (EUA). This EUA will remain in effect (meaning this test can be used) for the duration of the COVID-19 declaration under Section 564(b)(1) of the Act, 21 U.S.C. section 360bbb-3(b)(1), unless the authorization is terminated or revoked.  Performed at [facility], [address] [place], [place] [address]   Gastrointestinal Panel by PCR , Stool     Status: None   Collection Time: 12/27/20  8:39 PM   Specimen: Stool  Result Value Ref Range Status   Campylobacter species NOT DETECTED NOT DETECTED Final   Plesimonas shigelloides NOT DETECTED NOT DETECTED Final   Salmonella species NOT DETECTED NOT DETECTED Final   Yersinia enterocolitica NOT DETECTED NOT DETECTED Final   Vibrio species NOT DETECTED NOT DETECTED Final   Vibrio cholerae NOT DETECTED NOT DETECTED Final   Enteroaggregative E coli (EAEC) NOT DETECTED NOT DETECTED Final   Enteropathogenic E coli (EPEC) NOT DETECTED NOT DETECTED Final   Enterotoxigenic E coli (ETEC) NOT DETECTED NOT DETECTED Final   Shiga like toxin producing E coli (STEC) NOT DETECTED NOT DETECTED Final   Shigella/Enteroinvasive E  coli (EIEC) NOT DETECTED NOT DETECTED Final   Cryptosporidium NOT DETECTED NOT DETECTED Final   Cyclospora cayetanensis NOT DETECTED NOT DETECTED Final   Entamoeba histolytica NOT DETECTED NOT DETECTED Final   Giardia lamblia NOT DETECTED NOT DETECTED Final   Adenovirus F40/41 NOT DETECTED NOT DETECTED Final   Astrovirus NOT DETECTED NOT DETECTED Final   Norovirus GI/GII NOT DETECTED NOT DETECTED Final   Rotavirus A NOT DETECTED NOT DETECTED Final   Sapovirus (I, II, IV, and V) NOT DETECTED NOT DETECTED Final    Comment: Performed at [facility], [address], [place], [place] [address]  C Difficile Quick Screen w PCR reflex     Status: None   Collection Time: 12/27/20  8:39 PM   Specimen: Stool  Result Value Ref Range Status   C Diff antigen NEGATIVE NEGATIVE Final   C Diff toxin NEGATIVE NEGATIVE Final   C Diff interpretation No C. difficile detected.  Final    Comment: Performed at [facility], [address] [place], [place] [address]  Urine Culture     Status: Abnormal   Collection Time: 12/28/20  6:02 PM   Specimen: PATH Cytology Urine  Result Value Ref Range Status    Specimen Description   Final    URINE, RANDOM Performed at [facility] [address] [place], [place] [address]    Special Requests   Final    CYTO URINE Performed at [facility], [address] [place], [place] [address]    Culture >=100,000 COLONIES/mL ESCHERICHIA COLI (A)  Final   Report Status 12/31/2020 FINAL  Final   Organism ID, Bacteria ESCHERICHIA COLI (A)  Final      Susceptibility   Escherichia coli - MIC*    AMPICILLIN >=32 RESISTANT Resistant     CEFAZOLIN <=4 SENSITIVE Sensitive     CEFEPIME <=0.12 SENSITIVE Sensitive     CEFTRIAXONE <=0.25 SENSITIVE Sensitive     CIPROFLOXACIN <=0.25 SENSITIVE Sensitive     GENTAMICIN <=1 SENSITIVE Sensitive     IMIPENEM <=0.25 SENSITIVE Sensitive     NITROFURANTOIN <=16 SENSITIVE Sensitive     TRIMETH/SULFA <=20 SENSITIVE Sensitive     AMPICILLIN/SULBACTAM >=32 RESISTANT Resistant     PIP/TAZO <=4 SENSITIVE Sensitive     * >=100,000 COLONIES/mL ESCHERICHIA COLI  Urine culture     Status: Abnormal   Collection Time: 12/28/20  6:10 PM   Specimen: Urine, Random  Result Value Ref Range Status   Specimen Description   Final    URINE, RANDOM Performed at [facility], [address], [place], [place] [address]    Special Requests   Final    RIGHT RENAL PELVIS URINE CULTURE Performed at [facility], [address] [place], [place] [address]    Culture >=100,000 COLONIES/mL ESCHERICHIA COLI (A)  Final   Report Status 12/31/2020 FINAL  Final   Organism ID, Bacteria ESCHERICHIA COLI (A)  Final      Susceptibility   Escherichia coli - MIC*    AMPICILLIN >=32 RESISTANT Resistant     CEFAZOLIN <=4 SENSITIVE Sensitive     CEFEPIME <=0.12 SENSITIVE Sensitive     CEFTRIAXONE <=0.25 SENSITIVE Sensitive     CIPROFLOXACIN <=0.25 SENSITIVE Sensitive     GENTAMICIN <=1 SENSITIVE Sensitive     IMIPENEM <=0.25 SENSITIVE Sensitive     NITROFURANTOIN <=16 SENSITIVE Sensitive     TRIMETH/SULFA  <=20 SENSITIVE Sensitive     AMPICILLIN/SULBACTAM >=32 RESISTANT Resistant  PIP/TAZO 8 SENSITIVE Sensitive     * >=100,000 COLONIES/mL ESCHERICHIA COLI  CULTURE, BLOOD (ROUTINE X 2) w Reflex to ID Panel     Status: None (Preliminary result)   Collection Time: 12/28/20  7:10 PM   Specimen: BLOOD  Result Value Ref Range Status   Specimen Description BLOOD BLOOD RIGHT HAND  Final   Special Requests   Final    BOTTLES DRAWN AEROBIC AND ANAEROBIC Blood Culture adequate volume   Culture   Final    NO GROWTH 4 DAYS Performed at [facility], [address] [place], [place] [address]    Report Status PENDING  Incomplete  CULTURE, BLOOD (ROUTINE X 2) w Reflex to ID Panel     Status: None (Preliminary result)   Collection Time: 12/28/20  7:46 PM   Specimen: BLOOD  Result Value Ref Range Status   Specimen Description BLOOD BLOOD LEFT HAND  Final   Special Requests   Final    BOTTLES DRAWN AEROBIC AND ANAEROBIC Blood Culture adequate volume   Culture   Final    NO GROWTH 4 DAYS Performed at [facility], [address] [place], [place] [address]    Report Status PENDING  Incomplete    Coagulation Studies: No results for input(s): LABPROT, INR in the last 72 hours.  Urinalysis: Recent Labs    12/30/20 2310  COLORURINE YELLOW*  LABSPEC 1.009  PHURINE 5.0  GLUCOSEU NEGATIVE  HGBUR LARGE*  BILIRUBINUR NEGATIVE  KETONESUR NEGATIVE  PROTEINUR 30*  NITRITE NEGATIVE  LEUKOCYTESUR LARGE*      Imaging: No results found.   Medications:   .  ceFAZolin (ANCEF) IV 1 g (01/01/21 1308)   . atorvastatin  40 mg Oral QHS  . brimonidine  1 drop Both Eyes BID   And  . timolol  1 drop Both Eyes BID  . Chlorhexidine Gluconate Cloth  6 each Topical Daily  . diltiazem  45 mg Oral Q6H  . feeding supplement  1 Container Oral TID BM  . insulin aspart  0-20 Units Subcutaneous TID WC  . insulin glargine  20 Units Subcutaneous Daily  . iron polysaccharides  150 mg  Oral Daily  . latanoprost  1 drop Left Eye QHS  . magnesium oxide  400 mg Oral Daily  . metoprolol tartrate  25 mg Oral Q6H  . pantoprazole  40 mg Intravenous Q12H  . tamsulosin  0.4 mg Oral Daily   acetaminophen **OR** acetaminophen, HYDROcodone-acetaminophen, ondansetron **OR** ondansetron (ZOFRAN) IV, traZODone  Assessment/ Plan:  Ms. Daylan Boggess is a 70 y.o.  female with past medical history of chronic cough, diabetes mellitus, GERD, hypertension and sleep apnea.  1. Acute Kidney Injury on chronic kidney disease stage 3B with baseline creatinine 1.26 and GFR of 44 on 01/08/2019.  Acute kidney injury secondary to prerenal obstruction Urology consulted and cleared obstructive stone Continued renal improvement Encourage oral intake Urine output adequate Recommend lasix  Continue monitoring renal recovery  Lab Results  Component Value Date   CREATININE 3.45 (H) 01/01/2021   CREATININE 4.95 (H) 12/31/2020   CREATININE 6.31 (H) 12/30/2020    Intake/Output Summary (Last 24 hours) at 01/01/2021 1433 Last data filed at 01/01/2021 1350 Gross per 24 hour  Intake 530 ml  Output 3351 ml  Net -2821 ml   2. Hypertension  BP stable on current regimen  3 Metabolic acidosis -Metoprolol  4. Hypokalemia Current level 3.5 Potassium po   5. Afib with RVR - likely due  to electrolyte imbalances - HR 130s-160s - Increased Diltiazem      LOS: 5 Yusra Ravert 4/1/20222:33 PM

## 2021-01-01 NOTE — Progress Notes (Signed)
GI note  Patient was scheduled to have upper endoscopy today as her heart rate seemed to have been better controlled yesterday when I saw her.  When the patient was brought up to endoscopy her heart rate was in the 150s and hence procedure was canceled.  She will be put on the schedule for tomorrow with Dr. Norma Fredrickson.  I will inform Dr. Norma Fredrickson.  Dr Wyline Mood MD,MRCP [facility]) Gastroenterology/Hepatology Pager: [phone]

## 2021-01-01 NOTE — Care Management Important Message (Signed)
Important Message  Patient Details  Name: Cynthia Lucas MRN: [id number] Date of Birth: 10/10/1950   Medicare Important Message Given:  Yes     [name] 01/01/2021, 1:52 PM

## 2021-01-01 NOTE — Progress Notes (Signed)
PT Cancellation Note  Patient Details Name: Cynthia Lucas MRN: [id number] DOB: [birth year]   Cancelled Treatment:    Reason Eval/Treat Not Completed: Medical issues which prohibited therapy (Chart reviewed, RN consulted. HR remains widely variable per tele, jumping as high as 150s for a few seconds at a time. Will defer PT evaluation to later date/time once medically ready.)   Keldan Eplin C 01/01/2021, 2:02 PM

## 2021-01-02 ENCOUNTER — Encounter
Admission: EM | Disposition: A | Discharge status: Home / Self Care | Payer: Self-pay | Source: Home / Self Care | Attending: Hospitalist

## 2021-01-02 ENCOUNTER — Inpatient Hospital Stay: Payer: Medicare Other | Admitting: Anesthesiology

## 2021-01-02 DIAGNOSIS — N10 Acute tubulo-interstitial nephritis: Secondary | ICD-10-CM | POA: Diagnosis not present

## 2021-01-02 DIAGNOSIS — N1330 Unspecified hydronephrosis: Secondary | ICD-10-CM | POA: Diagnosis not present

## 2021-01-02 HISTORY — PX: ESOPHAGOGASTRODUODENOSCOPY: SHX5428

## 2021-01-02 LAB — CULTURE, BLOOD (ROUTINE X 2)
Culture: NO GROWTH
Culture: NO GROWTH
Special Requests: ADEQUATE
Special Requests: ADEQUATE

## 2021-01-02 LAB — CBC
HCT: 31.5 % — ABNORMAL LOW (ref 36.0–46.0)
Hemoglobin: 10.3 g/dL — ABNORMAL LOW (ref 12.0–15.0)
MCH: 26.5 pg (ref 26.0–34.0)
MCHC: 32.7 g/dL (ref 30.0–36.0)
MCV: 81 fL (ref 80.0–100.0)
Platelets: 448 10*3/uL — ABNORMAL HIGH (ref 150–400)
RBC: 3.89 MIL/uL (ref 3.87–5.11)
RDW: 16.1 % — ABNORMAL HIGH (ref 11.5–15.5)
WBC: 11.9 10*3/uL — ABNORMAL HIGH (ref 4.0–10.5)
nRBC: 0 % (ref 0.0–0.2)

## 2021-01-02 LAB — BASIC METABOLIC PANEL
Anion gap: 10 (ref 5–15)
BUN: 47 mg/dL — ABNORMAL HIGH (ref 8–23)
CO2: 35 mmol/L — ABNORMAL HIGH (ref 22–32)
Calcium: 7.9 mg/dL — ABNORMAL LOW (ref 8.9–10.3)
Chloride: 96 mmol/L — ABNORMAL LOW (ref 98–111)
Creatinine, Ser: 2.44 mg/dL — ABNORMAL HIGH (ref 0.44–1.00)
GFR, Estimated: 21 mL/min — ABNORMAL LOW (ref >60)
Glucose, Bld: 178 mg/dL — ABNORMAL HIGH (ref 70–99)
Potassium: 3.1 mmol/L — ABNORMAL LOW (ref 3.5–5.1)
Sodium: 141 mmol/L (ref 135–145)

## 2021-01-02 LAB — GLUCOSE, CAPILLARY
Glucose-Capillary: 172 mg/dL — ABNORMAL HIGH (ref 70–99)
Glucose-Capillary: 169 mg/dL — ABNORMAL HIGH (ref 70–99)
Glucose-Capillary: 429 mg/dL — ABNORMAL HIGH (ref 70–99)
Glucose-Capillary: 269 mg/dL — ABNORMAL HIGH (ref 70–99)

## 2021-01-02 LAB — MAGNESIUM: Magnesium: 1.6 mg/dL — ABNORMAL LOW (ref 1.7–2.4)

## 2021-01-02 SURGERY — EGD (ESOPHAGOGASTRODUODENOSCOPY)
Anesthesia: General

## 2021-01-02 MED ORDER — SODIUM CHLORIDE 0.9 % IV SOLN
1.0000 g | Freq: Two times a day (BID) | INTRAVENOUS | Status: DC
  Administered 2021-01-02: 1 g via INTRAVENOUS
  Filled 2021-01-02 (×3): qty 10

## 2021-01-02 MED ORDER — PROPOFOL 500 MG/50ML IV EMUL
INTRAVENOUS | Status: AC
  Filled 2021-01-02: qty 50

## 2021-01-02 MED ORDER — METOPROLOL TARTRATE 50 MG PO TABS
50.0000 mg | ORAL_TABLET | Freq: Two times a day (BID) | ORAL | Status: DC
  Administered 2021-01-02 – 2021-01-03 (×2): 50 mg via ORAL
  Filled 2021-01-02 (×2): qty 1

## 2021-01-02 MED ORDER — LIDOCAINE HCL (CARDIAC) PF 100 MG/5ML IV SOSY
PREFILLED_SYRINGE | INTRAVENOUS | Status: DC | PRN
  Administered 2021-01-02: 50 mg via INTRAVENOUS

## 2021-01-02 MED ORDER — DILTIAZEM HCL ER COATED BEADS 180 MG PO CP24
180.0000 mg | ORAL_CAPSULE | Freq: Every day | ORAL | Status: DC
  Administered 2021-01-02 – 2021-01-03 (×2): 180 mg via ORAL
  Filled 2021-01-02 (×3): qty 1

## 2021-01-02 MED ORDER — PROPOFOL 500 MG/50ML IV EMUL
INTRAVENOUS | Status: DC | PRN
  Administered 2021-01-02: 150 ug/kg/min via INTRAVENOUS

## 2021-01-02 MED ORDER — PANTOPRAZOLE SODIUM 40 MG PO TBEC
40.0000 mg | DELAYED_RELEASE_TABLET | Freq: Two times a day (BID) | ORAL | Status: DC
  Administered 2021-01-02 – 2021-01-03 (×2): 40 mg via ORAL
  Filled 2021-01-02 (×2): qty 1

## 2021-01-02 MED ORDER — PROPOFOL 10 MG/ML IV BOLUS
INTRAVENOUS | Status: DC | PRN
  Administered 2021-01-02: 40 mg via INTRAVENOUS

## 2021-01-02 MED ORDER — MAGNESIUM SULFATE 2 GM/50ML IV SOLN
2.0000 g | Freq: Once | INTRAVENOUS | Status: AC
  Administered 2021-01-02: 2 g via INTRAVENOUS
  Filled 2021-01-02: qty 50

## 2021-01-02 MED ORDER — SODIUM CHLORIDE 0.9 % IV SOLN: INTRAVENOUS | Status: DC

## 2021-01-02 MED ORDER — POTASSIUM CHLORIDE CRYS ER 20 MEQ PO TBCR
40.0000 meq | EXTENDED_RELEASE_TABLET | Freq: Once | ORAL | Status: AC
  Administered 2021-01-02: 40 meq via ORAL
  Filled 2021-01-02: qty 2

## 2021-01-02 MED ORDER — LIDOCAINE HCL (PF) 2 % IJ SOLN
INTRAMUSCULAR | Status: AC
  Filled 2021-01-02: qty 5

## 2021-01-02 MED ORDER — APIXABAN 5 MG PO TABS
5.0000 mg | ORAL_TABLET | Freq: Two times a day (BID) | ORAL | Status: DC
  Administered 2021-01-02 – 2021-01-03 (×3): 5 mg via ORAL
  Filled 2021-01-02 (×3): qty 1

## 2021-01-02 MED ORDER — SODIUM CHLORIDE 0.9 % IV SOLN
INTRAVENOUS | Status: DC | PRN
  Administered 2021-01-02 – 2021-01-03 (×2): 250 mL via INTRAVENOUS

## 2021-01-02 NOTE — Progress Notes (Signed)
Foley Cath removed at 1836. Patient assisted to bathroom for bowel movement,

## 2021-01-02 NOTE — Op Note (Signed)
[facility] Gastroenterology Patient Name: Cynthia Lucas Procedure Date: 01/02/2021 8:24 AM MRN: [id number] Account #: [id number] Date of Birth: 06/26/1951 Admit Type: Inpatient Age: 70 Room: [facility] ENDO ROOM 4 Gender: Female Note Status: Finalized Procedure:             Upper GI endoscopy Indications:           Acute post hemorrhagic anemia, Hematemesis Providers:             Boykin Nearing. Norma Fredrickson MD, MD Referring MD:          Toy Cookey (Referring MD) Medicines:             Propofol per Anesthesia Complications:         No immediate complications. Procedure:             Pre-Anesthesia Assessment:                        - The risks and benefits of the procedure and the                         sedation options and risks were discussed with the                         patient. All questions were answered and informed                         consent was obtained.                        - Patient identification and proposed procedure were                         verified prior to the procedure by the nurse. The                         procedure was verified in the procedure room.                        - ASA Grade Assessment: III - A patient with severe                         systemic disease.                        - After reviewing the risks and benefits, the patient                         was deemed in satisfactory condition to undergo the                         procedure.                        After obtaining informed consent, the endoscope was                         passed under direct vision. Throughout the procedure,                         the patient's blood pressure,  pulse, and oxygen                         saturations were monitored continuously. The Endoscope                         was introduced through the mouth, and advanced to the                         third part of duodenum. The upper GI endoscopy was                         accomplished  without difficulty. The patient tolerated                         the procedure well. Findings:      Mucosal changes including feline appearance were found in the entire       esophagus. No biopsies or other specimens were collected for this exam.      A small hiatal hernia was present.      Patchy moderately erythematous mucosa without active bleeding and with       no stigmata of bleeding was found in the first portion of the duodenum.      The second portion of the duodenum and third portion of the duodenum       were normal.      The exam was otherwise without abnormality. Impression:            - Esophageal mucosal changes suspicious for                         eosinophilic esophagitis. No specimens collected.                        - Small hiatal hernia.                        - Erythematous duodenopathy.                        - Normal second portion of the duodenum and third                         portion of the duodenum.                        - The examination was otherwise normal. Recommendation:        - Return patient to hospital ward for ongoing care.                        - Continue present medications.                        - Advance diet as tolerated - advance as tolerated to                         resume previous diet.                        - [employer] will sign off for now. Call us back if any  changes clinically or if we can help for any reason. Procedure Code(s):     --- Professional ---                        [id number], Esophagogastroduodenoscopy, flexible,                         transoral; diagnostic, including collection of                         specimen(s) by brushing or washing, when performed                         (separate procedure) Diagnosis Code(s):     --- Professional ---                        K92.0, Hematemesis                        D62, Acute posthemorrhagic anemia                        K31.89, Other diseases of stomach and  duodenum                        K44.9, Diaphragmatic hernia without obstruction or                         gangrene                        K22.8, Other specified diseases of esophagus CPT copyright 2019 American Medical Association. All rights reserved. The codes documented in this report are preliminary and upon coder review may  be revised to meet current compliance requirements. Stanton Kidney MD, MD 01/02/2021 8:49:27 AM This report has been signed electronically. Number of Addenda: 0 Note Initiated On: 01/02/2021 8:24 AM Estimated Blood Loss:  Estimated blood loss: none.      [facility]

## 2021-01-02 NOTE — Progress Notes (Signed)
Mobility Specialist - Progress Note    01/02/21 1400  Mobility  Activity Refused mobility  Mobility performed by Mobility specialist     2nd attempt this date. Pt with PT earlier and now taking a nap. Requests to hold mobility at this time. Will attempt another date/time.    Filiberto Pinks Mobility Specialist 01/02/21, 2:18 PM

## 2021-01-02 NOTE — Anesthesia Procedure Notes (Signed)
Date/Time: 01/02/2021 8:25 AM Performed by: Ginger Carne, CRNA Pre-anesthesia Checklist: Patient identified, Emergency Drugs available, Suction available, Patient being monitored and Timeout performed Patient Re-evaluated:Patient Re-evaluated prior to induction Oxygen Delivery Method: Nasal cannula Preoxygenation: Pre-oxygenation with 100% oxygen Induction Type: IV induction

## 2021-01-02 NOTE — Progress Notes (Signed)
[facility]  ROUNDING NOTE   Subjective:   Cynthia Lucas is a 70 y.o. female with past medical history of chronic cough, diabetes mellitus, GERD, hypertension and sleep apnea. She presents to the ED with complaints of weakness and tachycardia.   She is admitted for AKI   Patient is seen resting in bed after procedure Alert and oriented Denies nausea Says she has some shortness of breath but it has improved  Foley 3.9L  Objective:  Vital signs in last 24 hours:  Temp:  [97 F (36.1 C)-98.7 F (37.1 C)] 98.7 F (37.1 C) (04/02 1200) Pulse Rate:  [66-144] 66 (04/02 1200) Resp:  [11-27] 16 (04/02 1200) BP: (113-154)/(65-101) 137/76 (04/02 1200) SpO2:  [92 %-98 %] 97 % (04/02 1200)  Weight change:  Filed Weights   12/29/20 0400 12/29/20 0754 12/31/20 0300  Weight: 94.7 kg 96.3 kg 102.2 kg    Intake/Output: I/O last 3 completed shifts: In: 290 [P.O.:240; IV Piggyback:50] Out: 3926 [Urine:3925; Stool:1]   Intake/Output this shift:  Total I/O In: 257.5 [I.V.:107.5; IV Piggyback:150] Out: 900 [Urine:900]  Physical Exam: General: NAD  Head: Normocephalic, atraumatic. Moist oral mucosal membranes  Eyes: Anicteric, PERRL  Neck: Supple, trachea midline  Lungs:   Clear  Heart: Regular rate and rhythm  Abdomen:  Soft, nontender,   Extremities:  min peripheral edema.  Neurologic: Nonfocal, moving all four extremities  Skin: No lesions       Basic Metabolic Panel: Recent Labs  Lab 12/28/20 1504 12/29/20 0456 12/30/20 0341 12/30/20 1503 12/31/20 0526 01/01/21 0539 01/02/21 0543  NA  --  131* 138  --  139 139 141  K 3.1* 3.3* 2.7* 3.0* 3.6 3.5 3.1*  CL  --  98 98  --  99 98 96*  CO2  --  21* 27  --  25 31 35*  GLUCOSE  --  543* 133*  --  119* 196* 178*  BUN  --  87* 88*  --  80* 68* 47*  CREATININE  --  7.31* 6.31*  --  4.95* 3.45* 2.44*  CALCIUM  --  7.0* 6.8*  --  7.7* 7.7* 7.9*  MG 1.9  --  1.6*  --  2.4 1.8 1.6*  PHOS  --  4.5  4.5 2.5  --   3.7  --   --     Liver Function Tests: Recent Labs  Lab 12/27/20 1841 12/29/20 0456 12/30/20 0341  AST 38 25 23  ALT ALKPHOS 58 55 53  BILITOT 0.7 0.5 0.6  PROT 7.9 6.0* 5.8*  ALBUMIN 3.3* 2.4* 2.2*   No results for input(s): LIPASE, AMYLASE in the last 168 hours. No results for input(s): AMMONIA in the last 168 hours.  CBC: Recent Labs  Lab 12/27/20 1841 12/28/20 0502 12/29/20 0456 12/30/20 0341 12/31/20 0526 01/01/21 0539 01/02/21 0543  WBC 12.3*   < > 9.8 12.9* 12.5* 13.1* 11.9*  NEUTROABS 10.9*  --  8.7* 10.6*  --   --   --   HGB 13.7   < > 10.8* 10.7* 12.3 10.8* 10.3*  HCT 41.5   < > 32.0* 32.0* 37.1 32.3* 31.5*  MCV 79.2*   < > 78.4* 76.9* 78.6* 78.8* 81.0  PLT 237   < > 196 242 342 409* 448*   < > = values in this interval not displayed.    Cardiac Enzymes: No results for input(s): CKTOTAL, CKMB, CKMBINDEX, TROPONINI in the last 168 hours.  BNP:  Invalid input(s): POCBNP  CBG: Recent Labs  Lab 01/01/21 1243 01/01/21 1559 01/01/21 2159 01/02/21 0732 01/02/21 1159  GLUCAP 185* 187* 244* 172* 169*    Microbiology: Results for orders placed or performed during the hospital encounter of 12/27/20  Resp Panel by RT-PCR (Flu A&B, Covid) Nasopharyngeal Swab     Status: None   Collection Time: 12/27/20  6:48 PM   Specimen: Nasopharyngeal Swab; Nasopharyngeal(NP) swabs in vial transport medium  Result Value Ref Range Status   SARS Coronavirus 2 by RT PCR NEGATIVE NEGATIVE Final    Comment: (NOTE) SARS-CoV-2 target nucleic acids are NOT DETECTED.  The SARS-CoV-2 RNA is generally detectable in upper respiratory specimens during the acute phase of infection. The lowest concentration of SARS-CoV-2 viral copies this assay can detect is 138 copies/mL. A negative result does not preclude SARS-Cov-2 infection and should not be used as the sole basis for treatment or other patient management decisions. A negative result may occur with  improper  specimen collection/handling, submission of specimen other than nasopharyngeal swab, presence of viral mutation(s) within the areas targeted by this assay, and inadequate number of viral copies(<138 copies/mL). A negative result must be combined with clinical observations, patient history, and epidemiological information. The expected result is Negative.  Fact Sheet for Patients:  BloggerCourse.com  Fact Sheet for Healthcare Providers:  SeriousBroker.it  This test is no t yet approved or cleared by the [place] FDA and  has been authorized for detection and/or diagnosis of SARS-CoV-2 by FDA under an Emergency Use Authorization (EUA). This EUA will remain  in effect (meaning this test can be used) for the duration of the COVID-19 declaration under Section 564(b)(1) of the Act, 21 U.S.C.section 360bbb-3(b)(1), unless the authorization is terminated  or revoked sooner.       Influenza A by PCR NEGATIVE NEGATIVE Final   Influenza B by PCR NEGATIVE NEGATIVE Final    Comment: (NOTE) The Xpert Xpress SARS-CoV-2/FLU/RSV plus assay is intended as an aid in the diagnosis of influenza from Nasopharyngeal swab specimens and should not be used as a sole basis for treatment. Nasal washings and aspirates are unacceptable for Xpert Xpress SARS-CoV-2/FLU/RSV testing.  Fact Sheet for Patients: BloggerCourse.com  Fact Sheet for Healthcare Providers: SeriousBroker.it  This test is not yet approved or cleared by the [place] FDA and has been authorized for detection and/or diagnosis of SARS-CoV-2 by FDA under an Emergency Use Authorization (EUA). This EUA will remain in effect (meaning this test can be used) for the duration of the COVID-19 declaration under Section 564(b)(1) of the Act, 21 U.S.C. section 360bbb-3(b)(1), unless the authorization is terminated or revoked.  Performed at  [facility], [address] [place], [place] [address]   Gastrointestinal Panel by PCR , Stool     Status: None   Collection Time: 12/27/20  8:39 PM   Specimen: Stool  Result Value Ref Range Status   Campylobacter species NOT DETECTED NOT DETECTED Final   Plesimonas shigelloides NOT DETECTED NOT DETECTED Final   Salmonella species NOT DETECTED NOT DETECTED Final   Yersinia enterocolitica NOT DETECTED NOT DETECTED Final   Vibrio species NOT DETECTED NOT DETECTED Final   Vibrio cholerae NOT DETECTED NOT DETECTED Final   Enteroaggregative E coli (EAEC) NOT DETECTED NOT DETECTED Final   Enteropathogenic E coli (EPEC) NOT DETECTED NOT DETECTED Final   Enterotoxigenic E coli (ETEC) NOT DETECTED NOT DETECTED Final   Shiga like toxin producing E coli (STEC) NOT DETECTED NOT DETECTED Final  Shigella/Enteroinvasive E coli (EIEC) NOT DETECTED NOT DETECTED Final   Cryptosporidium NOT DETECTED NOT DETECTED Final   Cyclospora cayetanensis NOT DETECTED NOT DETECTED Final   Entamoeba histolytica NOT DETECTED NOT DETECTED Final   Giardia lamblia NOT DETECTED NOT DETECTED Final   Adenovirus F40/41 NOT DETECTED NOT DETECTED Final   Astrovirus NOT DETECTED NOT DETECTED Final   Norovirus GI/GII NOT DETECTED NOT DETECTED Final   Rotavirus A NOT DETECTED NOT DETECTED Final   Sapovirus (I, II, IV, and V) NOT DETECTED NOT DETECTED Final    Comment: Performed at [facility], [address] [place], [place] [address]  C Difficile Quick Screen w PCR reflex     Status: None   Collection Time: 12/27/20  8:39 PM   Specimen: Stool  Result Value Ref Range Status   C Diff antigen NEGATIVE NEGATIVE Final   C Diff toxin NEGATIVE NEGATIVE Final   C Diff interpretation No C. difficile detected.  Final    Comment: Performed at [facility], [address] [place], [place] [address]  Urine Culture     Status: Abnormal   Collection Time: 12/28/20  6:02 PM   Specimen: PATH Cytology  Urine  Result Value Ref Range Status   Specimen Description   Final    URINE, RANDOM Performed at [facility], [address] [place], [place] [address]    Special Requests   Final    CYTO URINE Performed at [facility], [address] [place], [place] [address]    Culture >=100,000 COLONIES/mL ESCHERICHIA COLI (A)  Final   Report Status 12/31/2020 FINAL  Final   Organism ID, Bacteria ESCHERICHIA COLI (A)  Final      Susceptibility   Escherichia coli - MIC*    AMPICILLIN >=32 RESISTANT Resistant     CEFAZOLIN <=4 SENSITIVE Sensitive     CEFEPIME <=0.12 SENSITIVE Sensitive     CEFTRIAXONE <=0.25 SENSITIVE Sensitive     CIPROFLOXACIN <=0.25 SENSITIVE Sensitive     GENTAMICIN <=1 SENSITIVE Sensitive     IMIPENEM <=0.25 SENSITIVE Sensitive     NITROFURANTOIN <=16 SENSITIVE Sensitive     TRIMETH/SULFA <=20 SENSITIVE Sensitive     AMPICILLIN/SULBACTAM >=32 RESISTANT Resistant     PIP/TAZO <=4 SENSITIVE Sensitive     * >=100,000 COLONIES/mL ESCHERICHIA COLI  Urine culture     Status: Abnormal   Collection Time: 12/28/20  6:10 PM   Specimen: Urine, Random  Result Value Ref Range Status   Specimen Description   Final    URINE, RANDOM Performed at [facility], [address] [place], [place] [address]    Special Requests   Final    RIGHT RENAL PELVIS URINE CULTURE Performed at [facility], [address] [place], [place] [address]    Culture >=100,000 COLONIES/mL ESCHERICHIA COLI (A)  Final   Report Status 12/31/2020 FINAL  Final   Organism ID, Bacteria ESCHERICHIA COLI (A)  Final      Susceptibility   Escherichia coli - MIC*    AMPICILLIN >=32 RESISTANT Resistant     CEFAZOLIN <=4 SENSITIVE Sensitive     CEFEPIME <=0.12 SENSITIVE Sensitive     CEFTRIAXONE <=0.25 SENSITIVE Sensitive     CIPROFLOXACIN <=0.25 SENSITIVE Sensitive     GENTAMICIN <=1 SENSITIVE Sensitive     IMIPENEM <=0.25 SENSITIVE Sensitive     NITROFURANTOIN <=16  SENSITIVE Sensitive     TRIMETH/SULFA <=20 SENSITIVE Sensitive     AMPICILLIN/SULBACTAM >=32 RESISTANT Resistant  PIP/TAZO 8 SENSITIVE Sensitive     * >=100,000 COLONIES/mL ESCHERICHIA COLI  CULTURE, BLOOD (ROUTINE X 2) w Reflex to ID Panel     Status: None   Collection Time: 12/28/20  7:10 PM   Specimen: BLOOD  Result Value Ref Range Status   Specimen Description BLOOD BLOOD RIGHT HAND  Final   Special Requests   Final    BOTTLES DRAWN AEROBIC AND ANAEROBIC Blood Culture adequate volume   Culture   Final    NO GROWTH 5 DAYS Performed at [facility], [address] [place], [place] [address]    Report Status 01/02/2021 FINAL  Final  CULTURE, BLOOD (ROUTINE X 2) w Reflex to ID Panel     Status: None   Collection Time: 12/28/20  7:46 PM   Specimen: BLOOD  Result Value Ref Range Status   Specimen Description BLOOD BLOOD LEFT HAND  Final   Special Requests   Final    BOTTLES DRAWN AEROBIC AND ANAEROBIC Blood Culture adequate volume   Culture   Final    NO GROWTH 5 DAYS Performed at [facility], [address], [place], [place] [address]    Report Status 01/02/2021 FINAL  Final    Coagulation Studies: No results for input(s): LABPROT, INR in the last 72 hours.  Urinalysis: Recent Labs    12/30/20 2310  COLORURINE YELLOW*  LABSPEC 1.009  PHURINE 5.0  GLUCOSEU NEGATIVE  HGBUR LARGE*  BILIRUBINUR NEGATIVE  KETONESUR NEGATIVE  PROTEINUR 30*  NITRITE NEGATIVE  LEUKOCYTESUR LARGE*      Imaging: No results found.   Medications:   . sodium chloride 10 mL/hr at 01/02/21 1124  .  ceFAZolin (ANCEF) IV Stopped (01/02/21 1041)  . magnesium sulfate bolus IVPB 2 g (01/02/21 1240)   . apixaban  5 mg Oral BID  . atorvastatin  40 mg Oral QHS  . brimonidine  1 drop Both Eyes BID   And  . timolol  1 drop Both Eyes BID  . Chlorhexidine Gluconate Cloth  6 each Topical Daily  . diltiazem  180 mg Oral Daily  . feeding supplement  1 Container Oral TID  BM  . insulin aspart  0-20 Units Subcutaneous TID WC  . insulin glargine  20 Units Subcutaneous Daily  . iron polysaccharides  150 mg Oral Daily  . latanoprost  1 drop Left Eye QHS  . magnesium oxide  400 mg Oral Daily  . metoprolol tartrate  50 mg Oral BID  . pantoprazole  40 mg Intravenous Q12H  . potassium chloride  40 mEq Oral Once  . tamsulosin  0.4 mg Oral Daily   sodium chloride, acetaminophen **OR** acetaminophen, HYDROcodone-acetaminophen, ondansetron **OR** ondansetron (ZOFRAN) IV, traZODone  Assessment/ Plan:  Ms. Janaiah Vetrano is a 70 y.o.  female with past medical history of chronic cough, diabetes mellitus, GERD, hypertension and sleep apnea.  1. Acute Kidney Injury on chronic kidney disease stage 3B with baseline creatinine 1.26 and GFR of 44 on 01/08/2019.  Acute kidney injury secondary to prerenal obstruction Urology consulted and cleared obstructive stone Continued renal improvement Encouraged oral intake Urine output adequate Recommend lasix  Continue monitoring renal recovery with outpatient follow up in office  Lab Results  Component Value Date   CREATININE 2.44 (H) 01/02/2021   CREATININE 3.45 (H) 01/01/2021   CREATININE 4.95 (H) 12/31/2020    Intake/Output Summary (Last 24 hours) at 01/02/2021 1242 Last data filed at 01/02/2021 1124 Gross per 24 hour  Intake 257.45 ml  Output 3076 ml  Net -2818.55 ml   2. Hypertension  BP stable on current regimen  3 Metabolic acidosis -Metoprolol  4. Hypokalemia Current level 3.1 Potassium po   5. Afib with RVR - managed on Diltiazem and metoprolol     LOS: 6 Aneya Daddona 4/2/202212:42 PM

## 2021-01-02 NOTE — Progress Notes (Signed)
ANTICOAGULATION CONSULT NOTE  Pharmacy Consult for Eliquis Indication: atrial fibrillation  Allergies  Allergen Reactions  . Other     Contact lense solution causes eye irritation    Patient Measurements: Height: 5\' 7"  (170.2 cm) Weight: 102.2 kg (225 lb 6.4 oz) IBW/kg (Calculated) : 61.6  Vital Signs: Temp: 97 F (36.1 C) (04/02 0759) Temp Source: Temporal (04/02 0759) BP: 151/75 (04/02 0859) Pulse Rate: 72 (04/02 0859)  Labs: Recent Labs    12/31/20 0526 01/01/21 0539 01/02/21 0543  HGB 12.3 10.8* 10.3*  HCT 37.1 32.3* 31.5*  PLT 342 409* 448*  CREATININE 4.95* 3.45* 2.44*    Estimated Creatinine Clearance: 26.7 mL/min (A) (by C-G formula based on SCr of 2.44 mg/dL (H)).   Medical History: Past Medical History:  Diagnosis Date  . Cough    CHRONIC  . Diabetes mellitus without complication (HCC)   . GERD (gastroesophageal reflux disease)   . History of kidney stones   . Hypertension   . Sleep apnea    no cpap  . Tremor    HEAD    Assessment: 70 year old female with nonvalvular atrial fibrillation. Pharmacy consult for Eliquis.    Plan:  Eliquis 5 mg BID.  78, PharmD 01/02/2021,11:00 AM

## 2021-01-02 NOTE — Anesthesia Preprocedure Evaluation (Addendum)
Anesthesia Evaluation   Patient awake  General Assessment Comment:Patient presented with diarrhea, nausea/vomiting, admitted to coffee ground emesis, found to have AKI with creatinine 6.9. Also new onset Afib with RVR. Now s/p cysto/stent placement with improvement in pyelonephritis picture. Patient's EGD yesterday was cancelled due to Afib with RVR heart rate in the 150s.   Reviewed: Allergy & Precautions, NPO status , Patient's Chart, lab work & pertinent test results  History of Anesthesia Complications Negative for: history of anesthetic complications  Airway Mallampati: II  TM Distance: >3 FB Neck ROM: Full    Dental no notable dental hx. (+) Teeth Intact   Pulmonary sleep apnea (does not wear CPAP because she doesn't like it) , neg COPD,    Pulmonary exam normal breath sounds clear to auscultation- rhonchi (-) wheezing      Cardiovascular hypertension, Pt. on medications (-) CAD, (-) Past MI, (-) Cardiac Stents and (-) CABG + dysrhythmias Atrial Fibrillation + Valvular Problems/Murmurs MR  Rhythm:Irregular Rate:Normal - Systolic murmurs and - Diastolic murmurs TTE 1. Left ventricular ejection fraction, by estimation, is 55 to 60%. The  left ventricle has normal function. The left ventricle has no regional  wall motion abnormalities. There is mild left ventricular hypertrophy.  Left ventricular diastolic function  could not be evaluated.  2. Right ventricular systolic function is normal. The right ventricular  size is normal. There is normal pulmonary artery systolic pressure.  3. Left atrial size was moderately dilated.  4. The mitral valve is normal in structure. Mild to moderate mitral valve  regurgitation. No evidence of mitral stenosis.  5. The aortic valve is normal in structure. Aortic valve regurgitation is  mild. Mild aortic valve sclerosis is present, with no evidence of aortic  valve stenosis.  6. Aortic  dilatation noted. There is mild dilatation of the ascending  aorta, measuring 41 mm.  7. Only parasternal views.   Neuro/Psych neg Seizures Head tremors negative psych ROS   GI/Hepatic Neg liver ROS, GERD  ,  Endo/Other  diabetes, Poorly Controlled, Oral Hypoglycemic Agents  Renal/GU ARFRenal disease     Musculoskeletal negative musculoskeletal ROS (+)   Abdominal (+) + obese,   Peds  Hematology  (+) REFUSES BLOOD PRODUCTS, Patient refuses all blood products and albumin even if it means her death.    Anesthesia Other Findings Past Medical History: No date: Cough     Comment:  CHRONIC No date: Diabetes mellitus without complication (HCC) No date: GERD (gastroesophageal reflux disease) No date: Hypertension No date: Sleep apnea     Comment:  no cpap No date: Tremor     Comment:  HEAD   Reproductive/Obstetrics                            Anesthesia Physical  Anesthesia Plan  ASA: III  Anesthesia Plan: General   Post-op Pain Management:    Induction: Intravenous  PONV Risk Score and Plan: 3 and Ondansetron, Propofol infusion, TIVA and Treatment may vary due to age or medical condition  Airway Management Planned: Nasal Cannula  Additional Equipment: None  Intra-op Plan:   Post-operative Plan:   Informed Consent: I have reviewed the patients History and Physical, chart, labs and discussed the procedure including the risks, benefits and alternatives for the proposed anesthesia with the patient or authorized representative who has indicated his/her understanding and acceptance.     Dental advisory given  Plan Discussed with: CRNA and Surgeon  Anesthesia  Plan Comments: (Discussed risks of anesthesia with patient, including possibility of difficulty with spontaneous ventilation under anesthesia necessitating airway intervention, PONV, and rare risks such as cardiac or respiratory or neurological events. Patient understands.)         Anesthesia Quick Evaluation

## 2021-01-02 NOTE — Transfer of Care (Signed)
Immediate Anesthesia Transfer of Care Note  [name]  Procedure(s) Performed: ESOPHAGOGASTRODUODENOSCOPY (EGD) (N/A )  Patient Location: PACU  Anesthesia Type:General  Level of Consciousness: awake, alert  and oriented  Airway & Oxygen Therapy: Patient Spontanous Breathing  Post-op Assessment: Report given to RN and Post -op Vital signs reviewed and stable  Post vital signs: Reviewed and stable  Last Vitals:  Vitals Value Taken Time  BP 151/75 01/02/21 0859  Temp    Pulse 72 01/02/21 0859  Resp 18 01/02/21 0859  SpO2 96 % 01/02/21 0859    Last Pain:  Vitals:   01/02/21 0759  TempSrc: Temporal  PainSc: 0-No pain         Complications: No complications documented.

## 2021-01-02 NOTE — Anesthesia Postprocedure Evaluation (Signed)
Anesthesia Post Note  [name]  Procedure(s) Performed: ESOPHAGOGASTRODUODENOSCOPY (EGD) (N/A )  Patient location during evaluation: Endoscopy Anesthesia Type: General Level of consciousness: awake and alert Pain management: pain level controlled Vital Signs Assessment: post-procedure vital signs reviewed and stable Respiratory status: spontaneous breathing, nonlabored ventilation, respiratory function stable and patient connected to nasal cannula oxygen Cardiovascular status: blood pressure returned to baseline and stable Postop Assessment: no apparent nausea or vomiting Anesthetic complications: no   No complications documented.   Last Vitals:  Vitals:   01/02/21 0759 01/02/21 0859  BP: (!) 154/82 (!) 151/75  Pulse: 68 72  Resp:  18  Temp: (!) 36.1 C   SpO2: 96% 96%    Last Pain:  Vitals:   01/02/21 0759  TempSrc: Temporal  PainSc: 0-No pain                 Corinda Gubler

## 2021-01-02 NOTE — Progress Notes (Signed)
Notified MD Lai CBG 425. MD confirmed via secure chat to give SSI 20 units total.

## 2021-01-02 NOTE — Progress Notes (Addendum)
Progress Note  Patient Name: Cynthia Lucas Date of Encounter: 01/02/2021  Primary Cardiologist: New to [facility] - consult by Kirke Corin  Subjective   She converted to sinus rhythm at 16:42 on 4/1 without significant post termination pause and has maintained sinus rhythm since with rates in the 60s to 70s bpm. She feels much better this morning. She underwent EGD this morning, which was unrevealing. IM indicating GI has authorized anticoagulation. She is without complaints. BP stable and improved when compared to earlier in her admission. HGB remains mildly low, though stable. Potassium low at 3.1. Renal function continues to improve.   Inpatient Medications    Scheduled Meds: . apixaban  5 mg Oral BID  . atorvastatin  40 mg Oral QHS  . brimonidine  1 drop Both Eyes BID   And  . timolol  1 drop Both Eyes BID  . Chlorhexidine Gluconate Cloth  6 each Topical Daily  . diltiazem  180 mg Oral Daily  . feeding supplement  1 Container Oral TID BM  . insulin aspart  0-20 Units Subcutaneous TID WC  . insulin glargine  20 Units Subcutaneous Daily  . iron polysaccharides  150 mg Oral Daily  . latanoprost  1 drop Left Eye QHS  . magnesium oxide  400 mg Oral Daily  . metoprolol tartrate  50 mg Oral BID  . pantoprazole  40 mg Intravenous Q12H  . potassium chloride  40 mEq Oral Once  . tamsulosin  0.4 mg Oral Daily   Continuous Infusions: . sodium chloride 250 mL (01/02/21 1008)  .  ceFAZolin (ANCEF) IV 1 g (01/02/21 1011)  . magnesium sulfate bolus IVPB     PRN Meds: sodium chloride, acetaminophen **OR** acetaminophen, HYDROcodone-acetaminophen, ondansetron **OR** ondansetron (ZOFRAN) IV, traZODone   Vital Signs    Vitals:   01/02/21 0345 01/02/21 0700 01/02/21 0759 01/02/21 0859  BP: 123/83 (!) 147/82 (!) 154/82 (!) 151/75  Pulse: 68 70 68 72  Resp: 17 16  18   Temp: 98.2 F (36.8 C) 98.3 F (36.8 C) (!) 97 F (36.1 C)   TempSrc: Oral Oral Temporal   SpO2:  96% 96% 96%  Weight:       Height:        Intake/Output Summary (Last 24 hours) at 01/02/2021 1109 Last data filed at 01/02/2021 03/04/2021 Gross per 24 hour  Intake 100 ml  Output 2176 ml  Net -2076 ml   Filed Weights   12/29/20 0400 12/29/20 0754 12/31/20 0300  Weight: 94.7 kg 96.3 kg 102.2 kg    Telemetry    Converted to sinus rhythm at 16:42 on 4/1 without significant post termination pause and has maintained sinus since - Personally Reviewed  ECG    No new tracings - Personally Reviewed  Physical Exam   GEN: No acute distress.   Neck: No JVD. Cardiac: RRR, no murmurs, rubs, or gallops.  Respiratory: Clear to auscultation bilaterally.  GI: Soft, nontender, non-distended.   MS: No edema; No deformity. Neuro:  Alert and oriented x 3; Nonfocal.  Psych: Normal affect.  Labs    Chemistry Recent Labs  Lab 12/27/20 1841 12/28/20 0502 12/29/20 0456 12/30/20 0341 12/30/20 1503 12/31/20 0526 01/01/21 0539 01/02/21 0543  NA 137   < > 131* 138  --  139 139 141  K 2.9*   < > 3.3* 2.7*   < > 3.6 3.5 3.1*  CL 100   < > 98 98  --  99 98 96*  CO2  21*   < > 21* 27  --  25 31 35*  GLUCOSE 441*   < > 543* 133*  --  119* 196* 178*  BUN 73*   < > 87* 88*  --  80* 68* 47*  CREATININE 6.56*   < > 7.31* 6.31*  --  4.95* 3.45* 2.44*  CALCIUM 8.3*   < > 7.0* 6.8*  --  7.7* 7.7* 7.9*  PROT 7.9  --  6.0* 5.8*  --   --   --   --   ALBUMIN 3.3*  --  2.4* 2.2*  --   --   --   --   AST 38  --  25 23  --   --   --   --   ALT 21  --  20 14  --   --   --   --   ALKPHOS 58  --  55 53  --   --   --   --   BILITOT 0.7  --  0.5 0.6  --   --   --   --   GFRNONAA 6*   < > 6* 7*  --  9* 14* 21*  ANIONGAP 16*   < > 12 13  --  15 10 10    < > = values in this interval not displayed.     Hematology Recent Labs  Lab 12/31/20 0526 01/01/21 0539 01/02/21 0543  WBC 12.5* 13.1* 11.9*  RBC 4.72 4.10 3.89  HGB 12.3 10.8* 10.3*  HCT 37.1 32.3* 31.5*  MCV 78.6* 78.8* 81.0  MCH 26.1 26.3 26.5  MCHC 33.2 33.4 32.7  RDW 16.2*  16.4* 16.1*  PLT 342 409* 448*    Cardiac EnzymesNo results for input(s): TROPONINI in the last 168 hours. No results for input(s): TROPIPOC in the last 168 hours.   BNP Recent Labs  Lab 12/27/20 1841  BNP 530.7*     DDimer No results for input(s): DDIMER in the last 168 hours.   Radiology    No results found.  Cardiac Studies   2D echo 12/29/2020: 1. Left ventricular ejection fraction, by estimation, is 55 to 60%. The  left ventricle has normal function. The left ventricle has no regional  wall motion abnormalities. There is mild left ventricular hypertrophy.  Left ventricular diastolic function  could not be evaluated.  2. Right ventricular systolic function is normal. The right ventricular  size is normal. There is normal pulmonary artery systolic pressure.  3. Left atrial size was moderately dilated.  4. The mitral valve is normal in structure. Mild to moderate mitral valve  regurgitation. No evidence of mitral stenosis.  5. The aortic valve is normal in structure. Aortic valve regurgitation is  mild. Mild aortic valve sclerosis is present, with no evidence of aortic  valve stenosis.  6. Aortic dilatation noted. There is mild dilatation of the ascending  aorta, measuring 41 mm.  7. Only parasternal views.   Patient Profile     70 y.o. female with history of DM2, essential tremor, sleep apnea, HTN, and GERD who was admitted with abdominal pain and coffee-ground emesis with mild anemia and found to have acute renal failure in the setting of obstructive uropathy and pyelonephritis who we are seeing for new onset A. fib with RVR of unknown chronicity.  Assessment & Plan    1. New onset A. fib with RVR: -Chronicity remains uncertain at this time -Atrial arrhythmia diagnosed in the setting of acute  renal failure in the setting of possible obstructive uropathy with pyelonephritis and suspected GI bleed -She converted to sinus rhythm at 16:42 on 4/1 as outlined  above -Consolidate metoprolol and diltiazem to Lopressor 50 mg bid and Cardizem CD 180 mg daily, respectively for ease of outpatient dosing -CHA2DS2-VASc of 4,  -Initially, anticoagulation was deferred due to suspected GI bleed and ARF. However, EGD was unrevealing this morning with ok to proceed with OAC and renal function is improving -Start Eliquis 5 mg bid (she does not meet reduced dosing criteria), pharmacy has already been consulted by IM for this -Check CBC and [facility], I Lusia Greis have our office arrange -TSH normal  2.  Elevated troponin: -Possibly supply demand ischemia in the setting of A. fib with RVR in the context of acute renal failure and suspected GI bleed -Echo this admission showed normal LV systolic function and wall motion -Consider outpatient Lexiscan MPI or coronary CTA  3.  ARF: -Felt to be secondary to obstructive uropathy with pyelonephritis -Renal function continues to improve -Nephrology is following  4. Suspected GI bleed: -EGD this morning unrevealing -IM indicates ok to place patient on OAC per GI, Eliquis started as above -Jolene Guyett arrange for follow up CBC and BMP in our office next week  5. Hypomagnesemia: -IV magnesium ordered for this morning  6. Hypokalemia: -Potassium repletion is ordered for this morning   I Tell Rozelle arrange follow up in our office.    For questions or updates, please contact [facility] Please consult [email or web address] for contact info under Cardiology/STEMI.    Signed, Eula Listen, PA-C [facility] Pager: [phone] 01/02/2021, 11:09 AM  I have seen and examined this patient with Eula Listen.  Agree with above, note added to reflect my findings.  On exam, RRR, no murmurs.  Patient is converted to sinus rhythm.  She is currently feeling well and is without complaint.  Would consolidate rate controlling medications as above.  Agree with starting Eliquis.  We Venna Berberich arrange for follow-up in cardiology  clinic.  Jagar Lua M. Shawnice Tilmon MD 01/02/2021 11:39 AM

## 2021-01-02 NOTE — Discharge Instructions (Signed)
Ureteral Stent Implantation, Care After This sheet gives you information about how to care for yourself after your procedure. Your health care provider may also give you more specific instructions. If you have problems or questions, contact your health care provider. What can I expect after the procedure? After the procedure, it is common to have:  Nausea.  Mild pain when you urinate. You may feel this pain in your lower back or lower abdomen. The pain should stop within a few minutes after you urinate. This may last for up to 1 week.  A small amount of blood in your urine for several days. Follow these instructions at home: Medicines  Take over-the-counter and prescription medicines only as told by your health care provider.  If you were prescribed an antibiotic medicine, take it as told by your health care provider. Do not stop taking the antibiotic even if you start to feel better.  Do not drive for 24 hours if you were given a sedative during your procedure.  Ask your health care provider if the medicine prescribed to you requires you to avoid driving or using heavy machinery. Activity  Rest as told by your health care provider.  Avoid sitting for a long time without moving. Get up to take short walks every 1-2 hours. This is important to improve blood flow and breathing. Ask for help if you feel weak or unsteady.  Return to your normal activities as told by your health care provider. Ask your health care provider what activities are safe for you. General instructions  Watch for any blood in your urine. Call your health care provider if the amount of blood in your urine increases.  If you have a catheter: ? Follow instructions from your health care provider about taking care of your catheter and collection bag. ? Do not take baths, swim, or use a hot tub until your health care provider approves. Ask your health care provider if you may take showers. You may only be allowed to  take sponge baths.  Drink enough fluid to keep your urine pale yellow.  Do not use any products that contain nicotine or tobacco, such as cigarettes, e-cigarettes, and chewing tobacco. These can delay healing after surgery. If you need help quitting, ask your health care provider.  Keep all follow-up visits as told by your health care provider. This is important.   Contact a health care provider if:  You have pain that gets worse or does not get better with medicine, especially pain when you urinate.  You have difficulty urinating.  You feel nauseous or you vomit repeatedly during a period of more than 2 days after the procedure. Get help right away if:  Your urine is dark red or has blood clots in it.  You are leaking urine (have incontinence).  The end of the stent comes out of your urethra.  You cannot urinate.  You have sudden, sharp, or severe pain in your abdomen or lower back.  You have a fever.  You have swelling or pain in your legs.  You have difficulty breathing. Summary  After the procedure, it is common to have mild pain when you urinate that goes away within a few minutes after you urinate. This may last for up to 1 week.  Watch for any blood in your urine. Call your health care provider if the amount of blood in your urine increases.  Take over-the-counter and prescription medicines only as told by your health care provider.  Drink enough fluid to keep your urine pale yellow. This information is not intended to replace advice given to you by your health care provider. Make sure you discuss any questions you have with your health care provider. Document Revised: 06/26/2018 Document Reviewed: 06/27/2018 Elsevier Patient Education  2021 [employer].  Information on my medicine - ELIQUIS (apixaban) This medication education was reviewed with me or my healthcare representative as part of my discharge preparation.   WHY WAS ELIQUIS PRESCRIBED FOR  YOU? Eliquis was prescribed for you to reduce the risk of a blood clot forming that can cause a stroke if you have a medical condition called atrial fibrillation (a type of irregular heartbeat). WHAT DO YOU NEED TO KNOW ABOUT ELIQUIS ? Take your Eliquis TWICE DAILY - one tablet in the morning and one tablet in the evening with or without food. If you have difficulty swallowing the tablet whole please discuss with your pharmacist how to take the medication safely. Take Eliquis exactly as prescribed by your doctor and DO NOT stop taking Eliquis without talking to the doctor who prescribed the medication. Stopping may increase your risk of developing a stroke. Refill your prescription before you run out. After discharge, you should have regular check-up appointments with your healthcare provider that is prescribing your Eliquis. In the future your dose may need to be changed if your kidney function or weight changes by a significant amount or as you get older. WHAT DO YOU DO IF YOU MISS A DOSE? If you miss a dose, take it as soon as you remember on the same day and resume taking twice daily. Do not take more than one dose of ELIQUIS at the same time to make up a missed dose. IMPORTANT SAFETY INFORMATION A possible side effect of Eliquis is bleeding. You should call your healthcare provider right away if you experience any of the following: ? Bleeding from an injury or your nose that does not stop. ? Unusual colored urine (red or dark brown) or unusual colored stools (red or black). ? Unusual bruising for unknown reasons. ? A serious fall or if you hit your head (even if there is no bleeding). Some medicines may interact with Eliquis and might increase your risk of bleeding or clotting while on Eliquis. To help avoid this, consult your healthcare provider or pharmacist prior to using any new prescription or non-prescription medications, including herbals, vitamins,  non-steroidal anti-inflammatory drugs (NSAIDs) and supplements. This website has more information on Eliquis (apixaban): [email or web address].cy.

## 2021-01-02 NOTE — Evaluation (Addendum)
Physical Therapy Evaluation Patient Details Name: Cynthia Lucas MRN: [id number] DOB: [birth year] Today's Date: 01/02/2021   History of Present Illness  Pt is a 70 y/o F who was admitted with abdominal pain and coffee-ground emesis with mild anemia and found to have acute renal failure in the setting of obstructive uropathy and pyelonephritis & new onset A. fib with RVR of unknown chronicity. Pt is also s/p cysto/stent placement. PMH: HTN, GERD, DM2, sleep apnea, head tremors, chronic cough  Clinical Impression  MD requested PT see pt in setting of slightly low K+ (3.1) Pt seen for PT evaluation with pt initially agreeable to tx. Pt is able to complete supine>sit with mod I with use of bed rails & HOB elevated. Pt completes stand pivot bed>recliner without AD & min assist then declines further mobility despite PT encouragement/education. Pt requesting to eat lunch so assisted pt with meal tray set up.   Due to limited mobility observed during session, pt's residence having 2 steps to enter without rails, and pt's brother working out of the home during the day & unable to assist pt at this time, this PT is recommending pt to d/c to STR to maximize independence with functional mobility & reduce fall risk prior to return home.      Follow Up Recommendations SNF    Equipment Recommendations  Rolling walker with 5" wheels    Recommendations for Other Services       Precautions / Restrictions Precautions Precautions: Fall Precaution Comments: foley catheter Restrictions Weight Bearing Restrictions: No      Mobility  Bed Mobility Overal bed mobility: Modified Independent             General bed mobility comments: supine>sit with HOB elevated, bed rails    Transfers Overall transfer level: Needs assistance   Transfers: Sit to/from Stand;Stand Pivot Transfers Sit to Stand: Min guard Stand pivot transfers: Min assist       General transfer comment: sit<>stand with 1UE  stabilizing on bed rail, stand pivot bed>recliner with min assist without AD with slow steps, decreased step length BLE  Ambulation/Gait                Stairs            Wheelchair Mobility    Modified Rankin (Stroke Patients Only)       Balance Overall balance assessment: Mild deficits observed, not formally tested;Needs assistance Sitting-balance support: Feet supported;No upper extremity supported Sitting balance-Leahy Scale: Good Sitting balance - Comments: supervision sitting EOB     Standing balance-Leahy Scale: Fair Standing balance comment: static standing without UE support, min assist for stand pivot transfer without AD                             Pertinent Vitals/Pain Pain Assessment: Faces Faces Pain Scale: Hurts little more Pain Location: catheter insertion location Pain Descriptors / Indicators: Sharp Pain Intervention(s): Repositioned;Monitored during session    Home Living Family/patient expects to be discharged to:: Private residence Living Arrangements:  (brother) Available Help at Discharge: Family;Available PRN/intermittently (brother works out of the home during the day) Type of Home: House Home Access: Stairs to enter Entrance Stairs-Rails: None Secretary/administrator of Steps: 2 Home Layout: One level Home Equipment: Cane - single point      Prior Function Level of Independence: Independent         Comments: without AD     Hand Dominance  Extremity/Trunk Assessment   Upper Extremity Assessment Upper Extremity Assessment: Generalized weakness    Lower Extremity Assessment Lower Extremity Assessment: Generalized weakness    Cervical / Trunk Assessment Cervical / Trunk Assessment:  (head tremor noted throughout session)  Communication      Cognition Arousal/Alertness: Awake/alert Behavior During Therapy:  (slightly irritated) Overall Cognitive Status: Within Functional Limits for tasks assessed                                  General Comments: AxO to month & year, reports she's in [place] but states [facility] with PT orienting her to [facility], when asked why she was in hospital pt simply states "I was sick"      General Comments      Exercises     Assessment/Plan    PT Assessment Patient needs continued PT services  PT Problem List Decreased strength;Decreased mobility;Decreased activity tolerance;Decreased balance;Decreased knowledge of use of DME       PT Treatment Interventions DME instruction;Therapeutic activities;Gait training;Therapeutic exercise;Patient/family education;Stair training;Balance training;Functional mobility training;Neuromuscular re-education    PT Goals (Current goals can be found in the Care Plan section)  Acute Rehab PT Goals Patient Stated Goal: to eat lunch PT Goal Formulation: With patient Time For Goal Achievement: 01/16/21 Potential to Achieve Goals: Good    Frequency Min 2X/week   Barriers to discharge Inaccessible home environment;Decreased caregiver support 2 steps to enter without rails, brother works out of the home during the day    Co-evaluation               AM-PAC PT "6 Clicks" Mobility  Outcome Measure Help needed turning from your back to your side while in a flat bed without using bedrails?: None Help needed moving from lying on your back to sitting on the side of a flat bed without using bedrails?: None Help needed moving to and from a bed to a chair (including a wheelchair)?: A Little Help needed standing up from a chair using your arms (e.g., wheelchair or bedside chair)?: A Little Help needed to walk in hospital room?: A Little Help needed climbing 3-5 steps with a railing? : A Lot 6 Click Score: 19    End of Session   Activity Tolerance: Patient tolerated treatment well Patient left: in chair;with call bell/phone within reach;with chair alarm set (set up with meal tray) Nurse  Communication: Mobility status PT Visit Diagnosis: Unsteadiness on feet (R26.81);Muscle weakness (generalized) (M62.81);Difficulty in walking, not elsewhere classified (R26.2)    Time: 8841-6606 PT Time Calculation (min) (ACUTE ONLY): 17 min   Charges:   PT Evaluation $PT Eval Low Complexity: 1 Low          Aleda Grana, PT, DPT 01/02/21, 12:23 PM   Sandi Mariscal 01/02/2021, 12:20 PM

## 2021-01-02 NOTE — Progress Notes (Addendum)
PROGRESS NOTE    Cynthia Lucas  WUJ:[id number] DOB: [birth year] DOA: 12/27/2020 PCP: Toy Cookey, FNP   Follow-up pyelonephritis. Brief Narrative:  Jinx Gilden a 70 y.o.African-American female femalewith medical history significant forhypertension, GERD, type 2 diabetes mellitus and sleep apnea as well as head tremors, who presented to the emergency room with acute onset of recurrent nausea and vomiting with diarrhea. She admits to coffee-ground emesis and denied any bright red bleeding per rectum or melena.  In the emergency room, she was found to have atrial fibrillation with rapid ventricular response, she also had acute renal failure with creatinine 6.56.  She is placed on IV fluids, consult from cardiology and GI was obtained from the emergency room. 3/28.  Patient ultrasound showed moderate hydronephrosis.  Ureteral stent was placed by urology, during the process, patient had pus drained from the kidney, consistent with acute pyelonephritis.  Cefepime was started.   Assessment & Plan:   Active Problems:   GI bleeding   Acute renal failure syndrome (HCC)   Metabolic acidosis   Hypokalemia   Atrial fibrillation with rapid ventricular response (HCC)   Uncontrolled type 2 diabetes mellitus with hyperglycemia, without long-term current use of insulin (HCC)   Sepsis (HCC)   Acute pyelonephritis   Hydronephrosis of right kidney   Kidney stone on right side  #1. Severe sepsis secondary to acute pyelonephritis. --on presentation, had severe tachycardia, tachypnea and leukocytosis.  Patient also had acute renal failure.  Pt started on cefepime, de-escalated to Ancef on 3/31 --urine cx pos for E coli Plan: --cont Ancef while inpatient, transition to oral Cipro at discharge, to complete a 10-day course, last day 4/6.  # obstructing right sided kidney stone causing acute pyelonephritis and  Right hydronephrosis  S/p right stent placement --s/p MIVF for post-obstructive  diuresis --IVF stopped on 3/31 due to dyspnea -Outpatient URS/LL/stent exchange with Dr. Lonna Cobb in 2 to 3 weeks  2.  Acute renal failure, POA, improved  on chronic kidney disease stage IIIa. Non anion gap metabolic acidosis. Patient renal failure is secondary to obstruction and sepsis. Patient is followed by nephrology --IVF stopped on 3/31 by nephrology due to dyspnea --Cr gradually improving. Plan: --d/c Foley today  Hypokalemia --replete with oral potassium  Hypomag --replete with IV mag PRN --cont oral mag supplements  Hyponatremia, resolved  #3.  Poorly controlled type 2 diabetes with hyperglycemia. --A1c 7.9 --cont Lantus 20u daily --ACHS and SSI TID  4.  Coffee-ground emesis. Iron deficient anemia.  Borderline B12 level. Patient hemoglobin still stable at 10.8, S/p IV iron and Vit B12 injection Plan: --EGD today, found no bleeding source --transition to oral PPI BID  5.  Acute metabolic encephalopathy. Secondary to sepsis, improved  #6. paroxysmal atrial fibrillation with rapid ventricular response. --s/p dilt gtt --rate controlled with dilt and metop Plan: --consolidate dilt to Cardizem 180 mg daily, per cardio --consolidate metop to Lopressor 50 mg BID, per cardio --resume Eliquis for anticoagulation, cleared by GI  Acute abdominal pain, new, resolved --onset morning of 3/30.  KUB remarkable, pt having normal BM's.  Discussed with urology, could be cramps from stent.  Started on Flomax --cont Flomax   DVT prophylaxis: SCDs Code Status: Full Family Communication: son updated on the phone today Disposition Plan:   Status is: Inpatient  Remains inpatient appropriate because:Inpatient level of care appropriate due to severity of illness   Dispo: The patient is from: Home              Anticipated  d/c is to: Home tomorrow.  Pt does not want to go to SNF rehab              Patient currently is medically stable to d/c.      Difficult to place  patient No    I/O last 3 completed shifts: In: 290 [P.O.:240; IV Piggyback:50] Out: 3926 [Urine:3925; Stool:1] Total I/O In: 497.5 [P.O.:240; I.V.:107.5; IV Piggyback:150] Out: 900 [Urine:900]     Consultants:   Urology, nephrology  Procedures: Ureteral stent.  Antimicrobials:  Cefepime.  Subjective: EGD today found no bleeding source, pt cleared to resume Eliquis.  Pt reported feeling tired today and didn't want to work with PT.  Dyspnea improved.   Objective: Vitals:   01/02/21 0700 01/02/21 0759 01/02/21 0859 01/02/21 1200  BP: (!) 147/82 (!) 154/82 (!) 151/75 137/76  Pulse: 70 68 72 66  Resp: 16  18 16   Temp: 98.3 F (36.8 C) (!) 97 F (36.1 C)  98.7 F (37.1 C)  TempSrc: Oral Temporal  Oral  SpO2: 96% 96% 96% 97%  Weight:      Height:        Intake/Output Summary (Last 24 hours) at 01/02/2021 1446 Last data filed at 01/02/2021 1300 Gross per 24 hour  Intake 497.45 ml  Output 2475 ml  Net -1977.55 ml   Filed Weights   12/29/20 0400 12/29/20 0754 12/31/20 0300  Weight: 94.7 kg 96.3 kg 102.2 kg    Examination:  Constitutional: NAD, AAOx3 HEENT: conjunctivae and lids normal, EOMI CV: No cyanosis.   RESP: normal respiratory effort, on RA Extremities: No effusions, edema in BLE SKIN: warm, dry Neuro: II - XII grossly intact.   Psych: Normal mood and affect.  Appropriate judgement and reason Foley present   Data Reviewed: I have personally reviewed following labs and imaging studies  CBC: Recent Labs  Lab 12/27/20 1841 12/28/20 0502 12/29/20 0456 12/30/20 0341 12/31/20 0526 01/01/21 0539 01/02/21 0543  WBC 12.3*   < > 9.8 12.9* 12.5* 13.1* 11.9*  NEUTROABS 10.9*  --  8.7* 10.6*  --   --   --   HGB 13.7   < > 10.8* 10.7* 12.3 10.8* 10.3*  HCT 41.5   < > 32.0* 32.0* 37.1 32.3* 31.5*  MCV 79.2*   < > 78.4* 76.9* 78.6* 78.8* 81.0  PLT 237   < > 196 242 342 409* 448*   < > = values in this interval not displayed.   Basic Metabolic  Panel: Recent Labs  Lab 12/28/20 1504 12/29/20 0456 12/30/20 0341 12/30/20 1503 12/31/20 0526 01/01/21 0539 01/02/21 0543  NA  --  131* 138  --  139 139 141  K 3.1* 3.3* 2.7* 3.0* 3.6 3.5 3.1*  CL  --  98 98  --  99 98 96*  CO2  --  21* 27  --  25 31 35*  GLUCOSE  --  543* 133*  --  119* 196* 178*  BUN  --  87* 88*  --  80* 68* 47*  CREATININE  --  7.31* 6.31*  --  4.95* 3.45* 2.44*  CALCIUM  --  7.0* 6.8*  --  7.7* 7.7* 7.9*  MG 1.9  --  1.6*  --  2.4 1.8 1.6*  PHOS  --  4.5  4.5 2.5  --  3.7  --   --    GFR: Estimated Creatinine Clearance: 26.7 mL/min (A) (by C-G formula based on SCr of 2.44 mg/dL (H)). Liver Function  Tests: Recent Labs  Lab 12/27/20 1841 12/29/20 0456 12/30/20 0341  AST 38 25 23  ALT 21 20 14   ALKPHOS 58 55 53  BILITOT 0.7 0.5 0.6  PROT 7.9 6.0* 5.8*  ALBUMIN 3.3* 2.4* 2.2*   No results for input(s): LIPASE, AMYLASE in the last 168 hours. No results for input(s): AMMONIA in the last 168 hours. Coagulation Profile: Recent Labs  Lab 12/27/20 1841  INR 1.2   Cardiac Enzymes: No results for input(s): CKTOTAL, CKMB, CKMBINDEX, TROPONINI in the last 168 hours. BNP (last 3 results) No results for input(s): PROBNP in the last 8760 hours. HbA1C: No results for input(s): HGBA1C in the last 72 hours. CBG: Recent Labs  Lab 01/01/21 1243 01/01/21 1559 01/01/21 2159 01/02/21 0732 01/02/21 1159  GLUCAP 185* 187* 244* 172* 169*   Lipid Profile: No results for input(s): CHOL, HDL, LDLCALC, TRIG, CHOLHDL, LDLDIRECT in the last 72 hours. Thyroid Function Tests: No results for input(s): TSH, T4TOTAL, FREET4, T3FREE, THYROIDAB in the last 72 hours. Anemia Panel: No results for input(s): VITAMINB12, FOLATE, FERRITIN, TIBC, IRON, RETICCTPCT in the last 72 hours. Sepsis Labs: Recent Labs  Lab 12/28/20 1824 12/28/20 1957  LATICACIDVEN 1.3 1.1    Recent Results (from the past 240 hour(s))  Resp Panel by RT-PCR (Flu A&B, Covid) Nasopharyngeal Swab      Status: None   Collection Time: 12/27/20  6:48 PM   Specimen: Nasopharyngeal Swab; Nasopharyngeal(NP) swabs in vial transport medium  Result Value Ref Range Status   SARS Coronavirus 2 by RT PCR NEGATIVE NEGATIVE Final    Comment: (NOTE) SARS-CoV-2 target nucleic acids are NOT DETECTED.  The SARS-CoV-2 RNA is generally detectable in upper respiratory specimens during the acute phase of infection. The lowest concentration of SARS-CoV-2 viral copies this assay can detect is 138 copies/mL. A negative result does not preclude SARS-Cov-2 infection and should not be used as the sole basis for treatment or other patient management decisions. A negative result may occur with  improper specimen collection/handling, submission of specimen other than nasopharyngeal swab, presence of viral mutation(s) within the areas targeted by this assay, and inadequate number of viral copies(<138 copies/mL). A negative result must be combined with clinical observations, patient history, and epidemiological information. The expected result is Negative.  Fact Sheet for Patients:  12/29/20  Fact Sheet for Healthcare Providers:  BloggerCourse.com  This test is no t yet approved or cleared by the [employer] FDA and  has been authorized for detection and/or diagnosis of SARS-CoV-2 by FDA under an Emergency Use Authorization (EUA). This EUA will remain  in effect (meaning this test can be used) for the duration of the COVID-19 declaration under Section 564(b)(1) of the Act, 21 U.S.C.section 360bbb-3(b)(1), unless the authorization is terminated  or revoked sooner.       Influenza A by PCR NEGATIVE NEGATIVE Final   Influenza B by PCR NEGATIVE NEGATIVE Final    Comment: (NOTE) The Xpert Xpress SARS-CoV-2/FLU/RSV plus assay is intended as an aid in the diagnosis of influenza from Nasopharyngeal swab specimens and should not be used as a sole basis  for treatment. Nasal washings and aspirates are unacceptable for Xpert Xpress SARS-CoV-2/FLU/RSV testing.  Fact Sheet for Patients: [place]  Fact Sheet for Healthcare Providers: BloggerCourse.com  This test is not yet approved or cleared by the [employer].[place] FDA and has been authorized for detection and/or diagnosis of SARS-CoV-2 by FDA under an Emergency Use Authorization (EUA). This EUA will remain in effect (meaning this  test can be used) for the duration of the COVID-19 declaration under Section 564(b)(1) of the Act, 21 U.S.C. section 360bbb-3(b)(1), unless the authorization is terminated or revoked.  Performed at [facility], [address] [place], [place] [address]   Gastrointestinal Panel by PCR , Stool     Status: None   Collection Time: 12/27/20  8:39 PM   Specimen: Stool  Result Value Ref Range Status   Campylobacter species NOT DETECTED NOT DETECTED Final   Plesimonas shigelloides NOT DETECTED NOT DETECTED Final   Salmonella species NOT DETECTED NOT DETECTED Final   Yersinia enterocolitica NOT DETECTED NOT DETECTED Final   Vibrio species NOT DETECTED NOT DETECTED Final   Vibrio cholerae NOT DETECTED NOT DETECTED Final   Enteroaggregative E coli (EAEC) NOT DETECTED NOT DETECTED Final   Enteropathogenic E coli (EPEC) NOT DETECTED NOT DETECTED Final   Enterotoxigenic E coli (ETEC) NOT DETECTED NOT DETECTED Final   Shiga like toxin producing E coli (STEC) NOT DETECTED NOT DETECTED Final   Shigella/Enteroinvasive E coli (EIEC) NOT DETECTED NOT DETECTED Final   Cryptosporidium NOT DETECTED NOT DETECTED Final   Cyclospora cayetanensis NOT DETECTED NOT DETECTED Final   Entamoeba histolytica NOT DETECTED NOT DETECTED Final   Giardia lamblia NOT DETECTED NOT DETECTED Final   Adenovirus F40/41 NOT DETECTED NOT DETECTED Final   Astrovirus NOT DETECTED NOT DETECTED Final   Norovirus GI/GII NOT DETECTED NOT  DETECTED Final   Rotavirus A NOT DETECTED NOT DETECTED Final   Sapovirus (I, II, IV, and V) NOT DETECTED NOT DETECTED Final    Comment: Performed at [facility], [address] [place], [place] [address]  C Difficile Quick Screen w PCR reflex     Status: None   Collection Time: 12/27/20  8:39 PM   Specimen: Stool  Result Value Ref Range Status   C Diff antigen NEGATIVE NEGATIVE Final   C Diff toxin NEGATIVE NEGATIVE Final   C Diff interpretation No C. difficile detected.  Final    Comment: Performed at [facility], [address] [place], [place] [address]  Urine Culture     Status: Abnormal   Collection Time: 12/28/20  6:02 PM   Specimen: PATH Cytology Urine  Result Value Ref Range Status   Specimen Description   Final    URINE, RANDOM Performed at [facility], [address] [place], [place] [address]    Special Requests   Final    CYTO URINE Performed at [facility], [address] [place], [place] [address]    Culture >=100,000 COLONIES/mL ESCHERICHIA COLI (A)  Final   Report Status 12/31/2020 FINAL  Final   Organism ID, Bacteria ESCHERICHIA COLI (A)  Final      Susceptibility   Escherichia coli - MIC*    AMPICILLIN >=32 RESISTANT Resistant     CEFAZOLIN <=4 SENSITIVE Sensitive     CEFEPIME <=0.12 SENSITIVE Sensitive     CEFTRIAXONE <=0.25 SENSITIVE Sensitive     CIPROFLOXACIN <=0.25 SENSITIVE Sensitive     GENTAMICIN <=1 SENSITIVE Sensitive     IMIPENEM <=0.25 SENSITIVE Sensitive     NITROFURANTOIN <=16 SENSITIVE Sensitive     TRIMETH/SULFA <=20 SENSITIVE Sensitive     AMPICILLIN/SULBACTAM >=32 RESISTANT Resistant     PIP/TAZO <=4 SENSITIVE Sensitive     * >=100,000 COLONIES/mL ESCHERICHIA COLI  Urine culture     Status: Abnormal   Collection Time: 12/28/20  6:10 PM   Specimen: Urine, Random  Result Value  Ref Range Status   Specimen Description   Final    URINE, RANDOM Performed at [facility], [address] [place], [place] [address]    Special Requests   Final    RIGHT RENAL PELVIS URINE CULTURE Performed at [facility], [address] [place], [place] [address]    Culture >=100,000 COLONIES/mL ESCHERICHIA COLI (A)  Final   Report Status 12/31/2020 FINAL  Final   Organism ID, Bacteria ESCHERICHIA COLI (A)  Final      Susceptibility   Escherichia coli - MIC*    AMPICILLIN >=32 RESISTANT Resistant     CEFAZOLIN <=4 SENSITIVE Sensitive     CEFEPIME <=0.12 SENSITIVE Sensitive     CEFTRIAXONE <=0.25 SENSITIVE Sensitive     CIPROFLOXACIN <=0.25 SENSITIVE Sensitive     GENTAMICIN <=1 SENSITIVE Sensitive     IMIPENEM <=0.25 SENSITIVE Sensitive     NITROFURANTOIN <=16 SENSITIVE Sensitive     TRIMETH/SULFA <=20 SENSITIVE Sensitive     AMPICILLIN/SULBACTAM >=32 RESISTANT Resistant     PIP/TAZO 8 SENSITIVE Sensitive     * >=100,000 COLONIES/mL ESCHERICHIA COLI  CULTURE, BLOOD (ROUTINE X 2) w Reflex to ID Panel     Status: None   Collection Time: 12/28/20  7:10 PM   Specimen: BLOOD  Result Value Ref Range Status   Specimen Description BLOOD BLOOD RIGHT HAND  Final   Special Requests   Final    BOTTLES DRAWN AEROBIC AND ANAEROBIC Blood Culture adequate volume   Culture   Final    NO GROWTH 5 DAYS Performed at [facility], [address] [place], [place] [address]    Report Status 01/02/2021 FINAL  Final  CULTURE, BLOOD (ROUTINE X 2) w Reflex to ID Panel     Status: None   Collection Time: 12/28/20  7:46 PM   Specimen: BLOOD  Result Value Ref Range Status   Specimen Description BLOOD BLOOD LEFT HAND  Final   Special Requests   Final    BOTTLES DRAWN AEROBIC AND ANAEROBIC Blood Culture adequate volume   Culture   Final    NO GROWTH 5 DAYS Performed at [facility], [address] [place], [place] [address]    Report Status 01/02/2021 FINAL  Final         Radiology Studies: No results found.      Scheduled Meds: . apixaban  5 mg Oral  BID  . atorvastatin  40 mg Oral QHS  . brimonidine  1 drop Both Eyes BID   And  . timolol  1 drop Both Eyes BID  . Chlorhexidine Gluconate Cloth  6 each Topical Daily  . diltiazem  180 mg Oral Daily  . feeding supplement  1 Container Oral TID BM  . insulin aspart  0-20 Units Subcutaneous TID WC  . insulin glargine  20 Units Subcutaneous Daily  . iron polysaccharides  150 mg Oral Daily  . latanoprost  1 drop Left Eye QHS  . magnesium oxide  400 mg Oral Daily  . metoprolol tartrate  50 mg Oral BID  . pantoprazole  40 mg Intravenous Q12H  . tamsulosin  0.4 mg Oral Daily   Continuous Infusions: . sodium chloride 10 mL/hr at 01/02/21 1124  .  ceFAZolin (ANCEF) IV Stopped (01/02/21 1041)     LOS: 6 days     Darlin Priestlyina Geraldean Walen, MD [facility]   To contact the attending provider between 7A-7P or the covering provider during after hours 7P-7A, please  log into the web site [email or web address] and access using universal [place] password for that web site. If you do not have the password, please call the hospital operator.  01/02/2021, 2:46 PM

## 2021-01-02 NOTE — Interval H&P Note (Signed)
History and Physical Interval Note:  01/02/2021 8:16 AM  Cynthia Lucas  has presented today for surgery, with the diagnosis of Gastrointestinal Bleed.  The various methods of treatment have been discussed with the patient and family. After consideration of risks, benefits and other options for treatment, the patient has consented to  Procedure(s): ESOPHAGOGASTRODUODENOSCOPY (EGD) (N/A) as a surgical intervention.  The patient's history has been reviewed, patient examined, no change in status, stable for surgery.  I have reviewed the patient's chart and labs.  Questions were answered to the patient's satisfaction.     [place], [place]

## 2021-01-03 LAB — CBC
HCT: 31.2 % — ABNORMAL LOW (ref 36.0–46.0)
Hemoglobin: 9.8 g/dL — ABNORMAL LOW (ref 12.0–15.0)
MCH: 26.2 pg (ref 26.0–34.0)
MCHC: 31.4 g/dL (ref 30.0–36.0)
MCV: 83.4 fL (ref 80.0–100.0)
Platelets: 446 10*3/uL — ABNORMAL HIGH (ref 150–400)
RBC: 3.74 MIL/uL — ABNORMAL LOW (ref 3.87–5.11)
RDW: 16.3 % — ABNORMAL HIGH (ref 11.5–15.5)
WBC: 9.9 10*3/uL (ref 4.0–10.5)
nRBC: 0 % (ref 0.0–0.2)

## 2021-01-03 LAB — BASIC METABOLIC PANEL
Anion gap: 10 (ref 5–15)
BUN: 38 mg/dL — ABNORMAL HIGH (ref 8–23)
CO2: 32 mmol/L (ref 22–32)
Calcium: 7.8 mg/dL — ABNORMAL LOW (ref 8.9–10.3)
Chloride: 96 mmol/L — ABNORMAL LOW (ref 98–111)
Creatinine, Ser: 1.94 mg/dL — ABNORMAL HIGH (ref 0.44–1.00)
GFR, Estimated: 28 mL/min — ABNORMAL LOW (ref >60)
Glucose, Bld: 155 mg/dL — ABNORMAL HIGH (ref 70–99)
Potassium: 3.1 mmol/L — ABNORMAL LOW (ref 3.5–5.1)
Sodium: 138 mmol/L (ref 135–145)

## 2021-01-03 LAB — GLUCOSE, CAPILLARY
Glucose-Capillary: 161 mg/dL — ABNORMAL HIGH (ref 70–99)
Glucose-Capillary: 292 mg/dL — ABNORMAL HIGH (ref 70–99)

## 2021-01-03 LAB — MAGNESIUM: Magnesium: 1.6 mg/dL — ABNORMAL LOW (ref 1.7–2.4)

## 2021-01-03 MED ORDER — METOPROLOL TARTRATE 50 MG PO TABS: 50.0000 mg | ORAL_TABLET | Freq: Two times a day (BID) | ORAL | 2 refills | Status: DC

## 2021-01-03 MED ORDER — MAGNESIUM OXIDE 400 (241.3 MG) MG PO TABS: 400.0000 mg | ORAL_TABLET | Freq: Every day | ORAL | 0 refills | Status: AC

## 2021-01-03 MED ORDER — APIXABAN 5 MG PO TABS: 5.0000 mg | ORAL_TABLET | Freq: Two times a day (BID) | ORAL | 2 refills | Status: DC

## 2021-01-03 MED ORDER — MAGNESIUM SULFATE 4 GM/100ML IV SOLN
4.0000 g | Freq: Once | INTRAVENOUS | Status: AC
  Administered 2021-01-03: 4 g via INTRAVENOUS
  Filled 2021-01-03: qty 100

## 2021-01-03 MED ORDER — METFORMIN HCL 500 MG PO TABS: 1000.0000 mg | ORAL_TABLET | Freq: Two times a day (BID) | ORAL | 2 refills | Status: DC

## 2021-01-03 MED ORDER — MAGNESIUM SULFATE 2 GM/50ML IV SOLN
2.0000 g | Freq: Once | INTRAVENOUS | Status: DC
  Filled 2021-01-03: qty 50

## 2021-01-03 MED ORDER — POLYSACCHARIDE IRON COMPLEX 150 MG PO CAPS
150.0000 mg | ORAL_CAPSULE | Freq: Every day | ORAL | Status: DC
Start: 2021-01-03 — End: 2022-04-26

## 2021-01-03 MED ORDER — POTASSIUM CHLORIDE CRYS ER 20 MEQ PO TBCR: 40.0000 meq | EXTENDED_RELEASE_TABLET | Freq: Every day | ORAL | 0 refills | Status: DC

## 2021-01-03 MED ORDER — TAMSULOSIN HCL 0.4 MG PO CAPS: 0.4000 mg | ORAL_CAPSULE | Freq: Every day | ORAL | 0 refills | Status: AC

## 2021-01-03 MED ORDER — DILTIAZEM HCL ER COATED BEADS 180 MG PO CP24: 180.0000 mg | ORAL_CAPSULE | Freq: Every day | ORAL | 2 refills | Status: DC

## 2021-01-03 MED ORDER — POTASSIUM CHLORIDE CRYS ER 20 MEQ PO TBCR
40.0000 meq | EXTENDED_RELEASE_TABLET | Freq: Once | ORAL | Status: AC
  Administered 2021-01-03: 40 meq via ORAL
  Filled 2021-01-03: qty 2

## 2021-01-03 MED ORDER — CIPROFLOXACIN HCL 500 MG PO TABS: 500.0000 mg | ORAL_TABLET | Freq: Two times a day (BID) | ORAL | 0 refills | Status: AC

## 2021-01-03 MED ORDER — CIPROFLOXACIN HCL 500 MG PO TABS
500.0000 mg | ORAL_TABLET | Freq: Two times a day (BID) | ORAL | Status: DC
  Administered 2021-01-03: 500 mg via ORAL
  Filled 2021-01-03 (×3): qty 1

## 2021-01-03 MED ORDER — CIPROFLOXACIN HCL 500 MG PO TABS: 500.0000 mg | ORAL_TABLET | Freq: Two times a day (BID) | ORAL | 0 refills | Status: DC

## 2021-01-03 MED ORDER — PANTOPRAZOLE SODIUM 40 MG PO TBEC: 40.0000 mg | DELAYED_RELEASE_TABLET | Freq: Two times a day (BID) | ORAL | 2 refills | Status: DC

## 2021-01-03 NOTE — TOC Progression Note (Addendum)
Transition of Care [facility]) - Progression Note    Patient Details  Name: Cynthia Lucas MRN: [id number] Date of Birth: [birth year]  [facility] CM/SW Contact  Bing Quarry, RN Phone Number: 01/03/2021, 9:50 AM  Clinical Narrative:   Patient to be discharged today per provider. Patient has been refusing therapies and refuses SNF. Provider ordering HH with RW. Attempting to contact patient's room, patient hung up the phone. VM box full on mobile. Left VM for call back from son who is helping to take care of her post discharge. Not sure patient will wait for services/DME to be arranged or delivered. Gabriel Cirri RN CM   1228 Update: Unable to secure [facility] with [facility] insurance. Spoke with son and he can take his mother to outpatient therapy in the afternoon as alternative plan for PT post discharge. Communicated to provider and team members. Son will transport patient home with RW. Gabriel Cirri RN CM  1515 UPDATE: Son chose [facility] at [address] [place], [place] [phone]. Order signed by Dr. Fran Lowes and faxed to [phone]. Communicated with son, provider, and team members. Patient/son to follow up with PCP and GI specialist on their own per provider. Discussed this all with son over several phone calls. RW delivered and son coming to transport patient home. Gabriel Cirri RN CM      Expected Discharge Plan and Services Home/Outpatient PT  Lives with Shari Heritage, Idelle Jo.          Expected Discharge Date: 01/03/21                                     Social Determinants of Health (SDOH) Interventions    Readmission Risk Interventions No flowsheet data found.

## 2021-01-03 NOTE — Discharge Summary (Signed)
Physician Discharge Summary   Cynthia Lucas  female DOB: Mar 02, 1951  ZOX:[id number]  PCP: Cynthia Cookey, FNP  Admit date: 12/27/2020 Discharge date: 01/03/2021  Admitted From: home Disposition:  Home Son wanted to take pt home.  Updated son on discharge plans.  CODE STATUS: Full code  Discharge Instructions    Discharge instructions   Complete by: As directed    Please finish taking antibiotic Cipro as directed for your kidney infection.  Please follow up with urology, nephrology, cardiology and GI doctors in outpatient clinics.  Your blood sugars have been high during your hospitalization, and you have been receiving insulin.  However, your A1c was in the 7's prior to presentation, so I have increased your home metformin but defer starting insulin to your primary care doctor.  Your potassium and magnesium levels have been low in the hospital.  Please take 5 days of potassium supplement and magnesium supplements, and follow up with your primary care doctor 1 week after discharge to recheck levels.   Dr. Darlin Priestly [facility] Course:  For full details, please see H&P, progress notes, consult notes and ancillary notes.  Briefly,  Cynthia Lucas a 70 y.o.African-American female femalewith medical history significant forhypertension, GERD, type 2 diabetes mellitus and sleep apnea as well as head tremors, who presented to the emergency room with acute onset of recurrent nausea and vomiting with diarrhea. She admits to coffee-ground emesis and denied any bright red bleeding per rectum or melena.  In the emergency room, she was found to have atrial fibrillation with rapid ventricular response, she also had acute renal failure with creatinine 6.56. She is placed on IV fluids, consult from cardiology and GI was obtained from the emergency room. Patient ultrasound showed moderate hydronephrosis.  Ureteral stent was placed by urology, during the process, patient had pus  drained from the kidney, consistent with acute pyelonephritis.  Cefepime was started.  Severe sepsis secondary to acute pyelonephritis, POA --on presentation, had severe tachycardia, tachypnea and leukocytosis.  Patient also had acute renal failure.  Pt started on cefepime, de-escalated to Ancef on 3/31. --urine cx pos for E coli --cont Ancef while inpatient, transitioned to oral Cipro at discharge, to complete a 14-day course.  # Obstructing right sided kidney stone causing acute pyelonephritis and  Right hydronephrosis  S/p right stent placement --s/p MIVF for post-obstructive diuresis --IVF stopped on 3/31 due to dyspnea. -Outpatient URS/LL/stent exchange with Dr. Lonna Cobb in 2 weeks  Acute renal failure, POA, improved  on chronic kidney disease stage IIIa. Non anion gap metabolic acidosis. Cr 6.56 on presentation.  Patient renal failure is secondary to obstruction and sepsis. Nephrology consulted.  Pt received IVF, which was stopped on 3/31 by nephrology due to dyspnea. --Cr gradually improving, was 1.94 prior to discharge.  Hypokalemia --repleted with oral potassium.  Pt was discharged on 5 days of potassium 40 mEq daily and advised to check level with outpatient provider 1 week after discharge.  Hypomag --replete with IV mag PRN as well as daily oral mag supplement.  Mag level persistently low, so pt was discharged on oral mag supplement and advised to follow up with outpatient provider to check level.  Hyponatremia, resolved  Poorly controlled type 2 diabetes with hyperglycemia. --A1c 7.9.  Pt was only on metformin at home.  Pt received Lantus 20u daily and SSI while inpatient.  Pt was discharged back on home metformin.  Pt was advised to follow up with outpatient provider  for consideration of starting insulin.  Coffee-ground emesis. Iron deficient anemia.  Borderline B12 level. Patient hemoglobin stable around 10's.  S/p IV iron and Vit B12 injection.   --EGD  performed, found no bleeding source, saw "changes suspicious for eosinophilic esophagitis." --transitioned to oral PPI BID.  Pt cleared to resume Eliquis by GI.    Acute metabolic encephalopathy. Secondary to sepsis, resolved.  paroxysmal atrial fibrillation with rapid ventricular response. --s/p dilt gtt.  Cardiology consulted. --rate eventually controlled with short-acting oral dilt and metop. --consolidated to Cardizem 180 mg daily and Lopressor 50 mg BID by cardiology prior to discharge. --resumed Eliquis for anticoagulation, cleared by GI  Acute abdominal pain, new, resolved onset morning of 3/30.  KUB remarkable, pt having normal BM's.  Discussed with urology, could be cramps from stent.  Started on Flomax, to be continued for 1 month after discharge.   Discharge Diagnoses:  Active Problems:   GI bleeding   Acute renal failure syndrome (HCC)   Metabolic acidosis   Hypokalemia   Atrial fibrillation with rapid ventricular response (HCC)   Uncontrolled type 2 diabetes mellitus with hyperglycemia, without long-term current use of insulin (HCC)   Sepsis (HCC)   Acute pyelonephritis   Hydronephrosis of right kidney   Kidney stone on right side   30 Day Unplanned Readmission Risk Score   Flowsheet Row ED to Hosp-Admission (Current) from 12/27/2020 in [facility]  30 Day Unplanned Readmission Risk Score (%) 18.71 Filed at 01/03/2021 0801     This score is the patient's risk of an unplanned readmission within 30 days of being discharged (0 -100%). The score is based on dignosis, age, lab data, medications, orders, and past utilization.   Low:  0-14.9   Medium: 15-21.9   High: 22-29.9   Extreme: 30 and above        Discharge Instructions:  Allergies as of 01/03/2021      Reactions   Other    Contact lense solution causes eye irritation      Medication List    STOP taking these medications   ACETAZOLAMIDE PO   hydrochlorothiazide 25 MG  tablet Commonly known as: HYDRODIURIL     TAKE these medications   Accu-Chek Aviva Plus test strip Generic drug: glucose blood   apixaban 5 MG Tabs tablet Commonly known as: ELIQUIS Take 1 tablet (5 mg total) by mouth 2 (two) times daily. Blood thinner.   atorvastatin 40 MG tablet Commonly known as: LIPITOR Take 40 mg by mouth at bedtime.   brimonidine-timolol 0.2-0.5 % ophthalmic solution Commonly known as: COMBIGAN Place 1 drop into both eyes every 12 (twelve) hours.   ciprofloxacin 500 MG tablet Commonly known as: CIPRO Take 1 tablet (500 mg total) by mouth 2 (two) times daily for 8 days. Antibiotic for kidney infection.   diltiazem 180 MG 24 hr capsule Commonly known as: CARDIZEM CD Take 1 capsule (180 mg total) by mouth daily. To control your heart rate.   iron polysaccharides 150 MG capsule Commonly known as: NIFEREX Take 1 capsule (150 mg total) by mouth daily. Can take any over-the-counter.   Lumigan 0.01 % Soln Generic drug: bimatoprost Place 1 drop into the left eye at bedtime.   magnesium oxide 400 (241.3 Mg) MG tablet Commonly known as: MAG-OX Take 1 tablet (400 mg total) by mouth daily.   metFORMIN 500 MG tablet Commonly known as: GLUCOPHAGE Take 2 tablets (1,000 mg total) by mouth 2 (two) times daily with a meal.  What changed: how much to take   metoprolol tartrate 50 MG tablet Commonly known as: LOPRESSOR Take 1 tablet (50 mg total) by mouth 2 (two) times daily. To control your heart rate.   pantoprazole 40 MG tablet Commonly known as: PROTONIX Take 1 tablet (40 mg total) by mouth 2 (two) times daily.   potassium chloride SA 20 MEQ tablet Commonly known as: KLOR-CON Take 2 tablets (40 mEq total) by mouth daily for 5 days.   tamsulosin 0.4 MG Caps capsule Commonly known as: FLOMAX Take 1 capsule (0.4 mg total) by mouth daily. To help with spasm caused by ureteral stent.            Durable Medical Equipment  (From admission, onward)          Start     Ordered   01/03/21 0913  For home use only DME Walker rolling  Once       Question Answer Comment  Walker: With 5 Inch Wheels   Patient needs a walker to treat with the following condition Weakness      01/03/21 0913           Follow-up Information    [facility].   Why: Dr. Lonna Cobb has been notified and f/u for stone removal will be arranged.        Mosetta Pigeon, MD. Schedule an appointment as soon as possible for a visit in 2 week(s).   Specialty: Nephrology Why: followup appointment will be arranged. Contact information: [address] [place] [address] [phone]        Sondra Barges, PA-C. Schedule an appointment as soon as possible for a visit in 2 week(s).   Specialties: Physician Assistant, Cardiology, Radiology Why: followup appointment will be arranged. Contact information: [address] [place] [address] [phone]        Stanton Kidney, MD. Schedule an appointment as soon as possible for a visit in 1 month(s).   Specialty: Gastroenterology Contact information: [address] [place] [address] [phone]        Cynthia Cookey, FNP. Schedule an appointment as soon as possible for a visit in 1 week(s).   Specialty: Family Medicine Contact information: [address] [place] [address] [phone]               Allergies  Allergen Reactions  . Other     Contact lense solution causes eye irritation     The results of significant diagnostics from this hospitalization (including imaging, microbiology, ancillary and laboratory) are listed below for reference.   Consultations:   Procedures/Studies: CT ABDOMEN PELVIS WO CONTRAST  Result Date: 12/28/2020 CLINICAL DATA:  Flank pain, kidney stones suspected, right hydronephrosis by ultrasound EXAM: CT ABDOMEN AND PELVIS WITHOUT CONTRAST TECHNIQUE: Multidetector CT imaging of the abdomen and  pelvis was performed following the standard protocol without IV contrast. COMPARISON:  Same day renal ultrasound, 12/28/2020 FINDINGS: Lower chest: Atelectasis and/or consolidation of the dependent right lung base. Hepatobiliary: No solid liver abnormality is seen. Hepatic steatosis. No gallstones, gallbladder wall thickening, or biliary dilatation. Pancreas: Unremarkable. No pancreatic ductal dilatation or surrounding inflammatory changes. Spleen: Normal in size without significant abnormality. Adrenals/Urinary Tract: Adrenal glands are unremarkable. Moderate right hydronephrosis with an oblong calculus at the right ureteropelvic junction measuring 2.0 cm in length. Right-sided perinephric fat stranding. No additional calculi identified. Stomach/Bowel: Stomach is within normal limits. Appendix appears normal. No evidence of bowel wall thickening, distention, or  inflammatory changes. Vascular/Lymphatic: Aortic atherosclerosis. No enlarged abdominal or pelvic lymph nodes. Reproductive: Status post hysterectomy. Other: No abdominal wall hernia or abnormality. No abdominopelvic ascites. Musculoskeletal: No acute or significant osseous findings. IMPRESSION: 1. Moderate right hydronephrosis with an oblong calculus at the right ureteropelvic junction measuring 2.0 cm in length. Right-sided perinephric fat stranding. No additional calculi identified. 2. Hepatic steatosis. Aortic Atherosclerosis (ICD10-I70.0). Electronically Signed   By: Lauralyn Primes M.D.   On: 12/28/2020 12:49   DG Abd 1 View  Result Date: 12/30/2020 CLINICAL DATA:  Abdominal pain with nausea and vomiting EXAM: ABDOMEN - 1 VIEW COMPARISON:  January 22, 2019 abdominal radiograph; CT abdomen and pelvis December 28, 2020 FINDINGS: The previously noted calculus at the right ureteropelvic junction is no longer evident. There is a double-J stent extending from the expected location of the right renal pelvis to the bladder. There is mild stool volume in the colon.  There is no bowel dilatation or air-fluid level to suggest bowel obstruction. No free air. Visualized lung bases are clear. IMPRESSION: Double-J stent now present on the right. Previously noted calculus at the right ureteropelvic junction not seen currently. No bowel obstruction or free air.  Visualized lung bases clear. Electronically Signed   By: Bretta Bang III M.D.   On: 12/30/2020 14:35   US RENAL  Result Date: 12/28/2020 CLINICAL DATA:  Acute kidney injury EXAM: RENAL / URINARY TRACT ULTRASOUND COMPLETE COMPARISON:  None. FINDINGS: Right Kidney: Renal measurements: 12.5 x 6.7 x 7.5 cm = volume: 328 mL. Moderate hydronephrosis. No renal mass. No visualized shadowing stone. Left Kidney: Renal measurements: 11.1 x 4.6 x 5.1 cm = volume: Is 138 mL. Echogenicity within normal limits. No mass or hydronephrosis visualized. Bladder: Appears normal for degree of bladder distention. Patient unable to void at time of examination. Other: None. IMPRESSION: Moderate right hydronephrosis. Electronically Signed   By: Deatra Robinson M.D.   On: 12/28/2020 00:56   DG Chest Portable 1 View  Result Date: 12/27/2020 CLINICAL DATA:  Shortness of breath EXAM: PORTABLE CHEST 1 VIEW COMPARISON:  None. FINDINGS: Cardiac shadow is within normal limits. Elevation of the right hemidiaphragm is noted. No focal infiltrate or effusion is seen. No acute bony abnormality is noted. IMPRESSION: No acute abnormality seen. Electronically Signed   By: Alcide Clever M.D.   On: 12/27/2020 19:08   DG OR UROLOGY CYSTO IMAGE ([facility] ONLY)  Result Date: 12/28/2020 There is no interpretation for this exam.  This order is for images obtained during a surgical procedure.  Please See "Surgeries" Tab for more information regarding the procedure.   ECHOCARDIOGRAM COMPLETE  Result Date: 12/29/2020    ECHOCARDIOGRAM REPORT   Patient Name:   TERI LEGACY Date of Exam: 12/29/2020 Medical Rec #:  [id number]     Height:       67.0 in Accession #:     [id number]    Weight:       212.4 lb Date of Birth:  11/30/1950     BSA:          2.075 m Patient Age:    69 years      BP:           110/75 mmHg Patient Gender: F             HR:           81 bpm. Exam Location:  [facility] Procedure: 2D Echo, Cardiac Doppler and Color Doppler Indications:     Atrial Fibrillation  I48.91  History:         Patient has no prior history of Echocardiogram examinations.                  Risk Factors:Hypertension and Diabetes.  Sonographer:     Cristela Blue RDCS (AE) Referring Phys:  [id number] Bjorn Pippin Diagnosing Phys: Lorine Bears MD  Sonographer Comments: No apical window, no subcostal window and Technically challenging study due to limited acoustic windows. IMPRESSIONS  1. Left ventricular ejection fraction, by estimation, is 55 to 60%. The left ventricle has normal function. The left ventricle has no regional wall motion abnormalities. There is mild left ventricular hypertrophy. Left ventricular diastolic function could not be evaluated.  2. Right ventricular systolic function is normal. The right ventricular size is normal. There is normal pulmonary artery systolic pressure.  3. Left atrial size was moderately dilated.  4. The mitral valve is normal in structure. Mild to moderate mitral valve regurgitation. No evidence of mitral stenosis.  5. The aortic valve is normal in structure. Aortic valve regurgitation is mild. Mild aortic valve sclerosis is present, with no evidence of aortic valve stenosis.  6. Aortic dilatation noted. There is mild dilatation of the ascending aorta, measuring 41 mm.  7. Only parasternal views. FINDINGS  Left Ventricle: Left ventricular ejection fraction, by estimation, is 55 to 60%. The left ventricle has normal function. The left ventricle has no regional wall motion abnormalities. The left ventricular internal cavity size was normal in size. There is  mild left ventricular hypertrophy. Left ventricular diastolic function could not be evaluated. Right Ventricle:  The right ventricular size is normal. No increase in right ventricular wall thickness. Right ventricular systolic function is normal. There is normal pulmonary artery systolic pressure. The tricuspid regurgitant velocity is 2.59 m/s, and  with an assumed right atrial pressure of 5 mmHg, the estimated right ventricular systolic pressure is 31.8 mmHg. Left Atrium: Left atrial size was moderately dilated. Right Atrium: Right atrial size was normal in size. Pericardium: There is no evidence of pericardial effusion. Mitral Valve: The mitral valve is normal in structure. Mild to moderate mitral valve regurgitation. No evidence of mitral valve stenosis. Tricuspid Valve: The tricuspid valve is normal in structure. Tricuspid valve regurgitation is mild . No evidence of tricuspid stenosis. Aortic Valve: The aortic valve is normal in structure. Aortic valve regurgitation is mild. Mild aortic valve sclerosis is present, with no evidence of aortic valve stenosis. Pulmonic Valve: The pulmonic valve was normal in structure. Pulmonic valve regurgitation is mild. No evidence of pulmonic stenosis. Aorta: Aortic dilatation noted. There is mild dilatation of the ascending aorta, measuring 41 mm. Venous: The inferior vena cava was not well visualized. IAS/Shunts: No atrial level shunt detected by color flow Doppler.  LEFT VENTRICLE PLAX 2D LVIDd:         3.53 cm LVIDs:         2.54 cm LV PW:         1.11 cm LV IVS:        1.27 cm LVOT diam:     2.10 cm LVOT Area:     3.46 cm  LEFT ATRIUM         Index LA diam:    4.40 cm 2.12 cm/m                        PULMONIC VALVE AORTA  PV Vmax:        0.78 m/s Ao Root diam: 3.65 cm PV Peak grad:   2.4 mmHg                       RVOT Peak grad: 2 mmHg  TRICUSPID VALVE TR Peak grad:   26.8 mmHg TR Vmax:        259.00 cm/s  SHUNTS Systemic Diam: 2.10 cm Lorine Bears MD Electronically signed by Lorine Bears MD Signature Date/Time: 12/29/2020/5:10:38 PM    Final        Labs: BNP (last 3 results) Recent Labs    12/27/20 1841  BNP 530.7*   Basic Metabolic Panel: Recent Labs  Lab 12/29/20 0456 12/30/20 0341 12/30/20 1503 12/31/20 0526 01/01/21 0539 01/02/21 0543 01/03/21 0427  NA 131* 138  --  139 139 141 138  K 3.3* 2.7* 3.0* 3.6 3.5 3.1* 3.1*  CL 98 98  --  99 98 96* 96*  CO2 21* 27  --  25 31 35* 32  GLUCOSE 543* 133*  --  119* 196* 178* 155*  BUN 87* 88*  --  80* 68* 47* 38*  CREATININE 7.31* 6.31*  --  4.95* 3.45* 2.44* 1.94*  CALCIUM 7.0* 6.8*  --  7.7* 7.7* 7.9* 7.8*  MG  --  1.6*  --  2.4 1.8 1.6* 1.6*  PHOS 4.5  4.5 2.5  --  3.7  --   --   --    Liver Function Tests: Recent Labs  Lab 12/27/20 1841 12/29/20 0456 12/30/20 0341  AST 38 25 23  ALT 21 20 14   ALKPHOS 58 55 53  BILITOT 0.7 0.5 0.6  PROT 7.9 6.0* 5.8*  ALBUMIN 3.3* 2.4* 2.2*   No results for input(s): LIPASE, AMYLASE in the last 168 hours. No results for input(s): AMMONIA in the last 168 hours. CBC: Recent Labs  Lab 12/27/20 1841 12/28/20 0502 12/29/20 0456 12/30/20 0341 12/31/20 0526 01/01/21 0539 01/02/21 0543 01/03/21 0427  WBC 12.3*   < > 9.8 12.9* 12.5* 13.1* 11.9* 9.9  NEUTROABS 10.9*  --  8.7* 10.6*  --   --   --   --   HGB 13.7   < > 10.8* 10.7* 12.3 10.8* 10.3* 9.8*  HCT 41.5   < > 32.0* 32.0* 37.1 32.3* 31.5* 31.2*  MCV 79.2*   < > 78.4* 76.9* 78.6* 78.8* 81.0 83.4  PLT 237   < > 196 242 342 409* 448* 446*   < > = values in this interval not displayed.   Cardiac Enzymes: No results for input(s): CKTOTAL, CKMB, CKMBINDEX, TROPONINI in the last 168 hours. BNP: Invalid input(s): POCBNP CBG: Recent Labs  Lab 01/02/21 0732 01/02/21 1159 01/02/21 1634 01/02/21 2044 01/03/21 0823  GLUCAP 172* 169* 429* 269* 161*   D-Dimer No results for input(s): DDIMER in the last 72 hours. Hgb A1c No results for input(s): HGBA1C in the last 72 hours. Lipid Profile No results for input(s): CHOL, HDL, LDLCALC, TRIG, CHOLHDL, LDLDIRECT in  the last 72 hours. Thyroid function studies No results for input(s): TSH, T4TOTAL, T3FREE, THYROIDAB in the last 72 hours.  Invalid input(s): FREET3 Anemia work up No results for input(s): VITAMINB12, FOLATE, FERRITIN, TIBC, IRON, RETICCTPCT in the last 72 hours. Urinalysis    Component Value Date/Time   COLORURINE YELLOW (A) 12/30/2020 2310   APPEARANCEUR CLOUDY (A) 12/30/2020 2310   APPEARANCEUR Cloudy (A) 01/22/2019 1302   LABSPEC 1.009 12/30/2020 2310  PHURINE 5.0 12/30/2020 2310   GLUCOSEU NEGATIVE 12/30/2020 2310   HGBUR LARGE (A) 12/30/2020 2310   BILIRUBINUR NEGATIVE 12/30/2020 2310   BILIRUBINUR Negative 01/22/2019 1302   KETONESUR NEGATIVE 12/30/2020 2310   PROTEINUR 30 (A) 12/30/2020 2310   NITRITE NEGATIVE 12/30/2020 2310   LEUKOCYTESUR LARGE (A) 12/30/2020 2310   Sepsis Labs Invalid input(s): PROCALCITONIN,  WBC,  LACTICIDVEN Microbiology Recent Results (from the past 240 hour(s))  Resp Panel by RT-PCR (Flu A&B, Covid) Nasopharyngeal Swab     Status: None   Collection Time: 12/27/20  6:48 PM   Specimen: Nasopharyngeal Swab; Nasopharyngeal(NP) swabs in vial transport medium  Result Value Ref Range Status   SARS Coronavirus 2 by RT PCR NEGATIVE NEGATIVE Final    Comment: (NOTE) SARS-CoV-2 target nucleic acids are NOT DETECTED.  The SARS-CoV-2 RNA is generally detectable in upper respiratory specimens during the acute phase of infection. The lowest concentration of SARS-CoV-2 viral copies this assay can detect is 138 copies/mL. A negative result does not preclude SARS-Cov-2 infection and should not be used as the sole basis for treatment or other patient management decisions. A negative result may occur with  improper specimen collection/handling, submission of specimen other than nasopharyngeal swab, presence of viral mutation(s) within the areas targeted by this assay, and inadequate number of viral copies(<138 copies/mL). A negative result must be combined  with clinical observations, patient history, and epidemiological information. The expected result is Negative.  Fact Sheet for Patients:  BloggerCourse.com  Fact Sheet for Healthcare Providers:  SeriousBroker.it  This test is no t yet approved or cleared by the [place] FDA and  has been authorized for detection and/or diagnosis of SARS-CoV-2 by FDA under an Emergency Use Authorization (EUA). This EUA will remain  in effect (meaning this test can be used) for the duration of the COVID-19 declaration under Section 564(b)(1) of the Act, 21 U.S.C.section 360bbb-3(b)(1), unless the authorization is terminated  or revoked sooner.       Influenza A by PCR NEGATIVE NEGATIVE Final   Influenza B by PCR NEGATIVE NEGATIVE Final    Comment: (NOTE) The Xpert Xpress SARS-CoV-2/FLU/RSV plus assay is intended as an aid in the diagnosis of influenza from Nasopharyngeal swab specimens and should not be used as a sole basis for treatment. Nasal washings and aspirates are unacceptable for Xpert Xpress SARS-CoV-2/FLU/RSV testing.  Fact Sheet for Patients: BloggerCourse.com  Fact Sheet for Healthcare Providers: SeriousBroker.it  This test is not yet approved or cleared by the [place] FDA and has been authorized for detection and/or diagnosis of SARS-CoV-2 by FDA under an Emergency Use Authorization (EUA). This EUA will remain in effect (meaning this test can be used) for the duration of the COVID-19 declaration under Section 564(b)(1) of the Act, 21 U.S.C. section 360bbb-3(b)(1), unless the authorization is terminated or revoked.  Performed at [facility], [address] [place], [place] [address]   Gastrointestinal Panel by PCR , Stool     Status: None   Collection Time: 12/27/20  8:39 PM   Specimen: Stool  Result Value Ref Range Status   Campylobacter species NOT  DETECTED NOT DETECTED Final   Plesimonas shigelloides NOT DETECTED NOT DETECTED Final   Salmonella species NOT DETECTED NOT DETECTED Final   Yersinia enterocolitica NOT DETECTED NOT DETECTED Final   Vibrio species NOT DETECTED NOT DETECTED Final   Vibrio cholerae NOT DETECTED NOT DETECTED Final   Enteroaggregative E coli (EAEC) NOT DETECTED NOT DETECTED Final   Enteropathogenic E  coli (EPEC) NOT DETECTED NOT DETECTED Final   Enterotoxigenic E coli (ETEC) NOT DETECTED NOT DETECTED Final   Shiga like toxin producing E coli (STEC) NOT DETECTED NOT DETECTED Final   Shigella/Enteroinvasive E coli (EIEC) NOT DETECTED NOT DETECTED Final   Cryptosporidium NOT DETECTED NOT DETECTED Final   Cyclospora cayetanensis NOT DETECTED NOT DETECTED Final   Entamoeba histolytica NOT DETECTED NOT DETECTED Final   Giardia lamblia NOT DETECTED NOT DETECTED Final   Adenovirus F40/41 NOT DETECTED NOT DETECTED Final   Astrovirus NOT DETECTED NOT DETECTED Final   Norovirus GI/GII NOT DETECTED NOT DETECTED Final   Rotavirus A NOT DETECTED NOT DETECTED Final   Sapovirus (I, II, IV, and V) NOT DETECTED NOT DETECTED Final    Comment: Performed at [facility], [address] [place], [place] [address]  C Difficile Quick Screen w PCR reflex     Status: None   Collection Time: 12/27/20  8:39 PM   Specimen: Stool  Result Value Ref Range Status   C Diff antigen NEGATIVE NEGATIVE Final   C Diff toxin NEGATIVE NEGATIVE Final   C Diff interpretation No C. difficile detected.  Final    Comment: Performed at [facility], [address] [place], [place] [address]  Urine Culture     Status: Abnormal   Collection Time: 12/28/20  6:02 PM   Specimen: PATH Cytology Urine  Result Value Ref Range Status   Specimen Description   Final    URINE, RANDOM Performed at [facility], [address] [place], [place] [address]    Special Requests   Final    CYTO URINE Performed at [facility], [address] [place], [place] [address]    Culture >=100,000 COLONIES/mL ESCHERICHIA COLI (A)  Final   Report Status 12/31/2020 FINAL  Final   Organism ID, Bacteria ESCHERICHIA COLI (A)  Final      Susceptibility   Escherichia coli - MIC*    AMPICILLIN >=32 RESISTANT Resistant     CEFAZOLIN <=4 SENSITIVE Sensitive     CEFEPIME <=0.12 SENSITIVE Sensitive     CEFTRIAXONE <=0.25 SENSITIVE Sensitive     CIPROFLOXACIN <=0.25 SENSITIVE Sensitive     GENTAMICIN <=1 SENSITIVE Sensitive     IMIPENEM <=0.25 SENSITIVE Sensitive     NITROFURANTOIN <=16 SENSITIVE Sensitive     TRIMETH/SULFA <=20 SENSITIVE Sensitive     AMPICILLIN/SULBACTAM >=32 RESISTANT Resistant     PIP/TAZO <=4 SENSITIVE Sensitive     * >=100,000 COLONIES/mL ESCHERICHIA COLI  Urine culture     Status: Abnormal   Collection Time: 12/28/20  6:10 PM   Specimen: Urine, Random  Result Value Ref Range Status   Specimen Description   Final    URINE, RANDOM Performed at [facility], [address] [place], [place] [address]    Special Requests   Final    RIGHT RENAL PELVIS URINE CULTURE Performed at [facility], [address] [place], [place] [address]    Culture >=100,000 COLONIES/mL ESCHERICHIA COLI (A)  Final   Report Status 12/31/2020 FINAL  Final   Organism ID, Bacteria ESCHERICHIA COLI (A)  Final      Susceptibility   Escherichia coli - MIC*    AMPICILLIN >=32 RESISTANT Resistant     CEFAZOLIN <=4 SENSITIVE Sensitive     CEFEPIME <=0.12 SENSITIVE Sensitive     CEFTRIAXONE <=0.25 SENSITIVE Sensitive     CIPROFLOXACIN <=0.25 SENSITIVE Sensitive     GENTAMICIN <=1 SENSITIVE  Sensitive     IMIPENEM <=0.25 SENSITIVE Sensitive     NITROFURANTOIN <=16 SENSITIVE Sensitive     TRIMETH/SULFA <=20 SENSITIVE Sensitive     AMPICILLIN/SULBACTAM >=32 RESISTANT Resistant     PIP/TAZO 8 SENSITIVE Sensitive     * >=100,000 COLONIES/mL ESCHERICHIA COLI  CULTURE, BLOOD (ROUTINE X 2) w Reflex to ID  Panel     Status: None   Collection Time: 12/28/20  7:10 PM   Specimen: BLOOD  Result Value Ref Range Status   Specimen Description BLOOD BLOOD RIGHT HAND  Final   Special Requests   Final    BOTTLES DRAWN AEROBIC AND ANAEROBIC Blood Culture adequate volume   Culture   Final    NO GROWTH 5 DAYS Performed at [facility], [address] [place], [place] [address]    Report Status 01/02/2021 FINAL  Final  CULTURE, BLOOD (ROUTINE X 2) w Reflex to ID Panel     Status: None   Collection Time: 12/28/20  7:46 PM   Specimen: BLOOD  Result Value Ref Range Status   Specimen Description BLOOD BLOOD LEFT HAND  Final   Special Requests   Final    BOTTLES DRAWN AEROBIC AND ANAEROBIC Blood Culture adequate volume   Culture   Final    NO GROWTH 5 DAYS Performed at [facility], [address] [place], [place] [address]    Report Status 01/02/2021 FINAL  Final     Total time spend on discharging this patient, including the last patient exam, discussing the hospital stay, instructions for ongoing care as it relates to all pertinent caregivers, as well as preparing the medical discharge records, prescriptions, and/or referrals as applicable, is 50 minutes.    Darlin Priestlyina Amera Banos, MD  [facility] 01/03/2021, 9:57 AM

## 2021-01-03 NOTE — Progress Notes (Signed)
Physical Therapy Treatment Patient Details Name: Cynthia Lucas MRN: [id number] DOB: 03-27-1951 Today's Date: 01/03/2021    History of Present Illness Pt is a 70 y/o F who was admitted with abdominal pain and coffee-ground emesis with mild anemia and found to have acute renal failure in the setting of obstructive uropathy and pyelonephritis & new onset A. fib with RVR of unknown chronicity. Pt is also s/p cysto/stent placement. PMH: HTN, GERD, DM2, sleep apnea, head tremors, chronic cough    PT Comments    Pt seen for PT tx with pt agreeable to tx, but not very receptive of education/instruction during session. Pt is able to ambulate 1 lap around nurses station with RW with supervision increasing to CGA 2/2 fatigue & worsening scissoring gait. Educated pt on recommendation for supervision for mobility upon d/c home but pt demonstrating impaired awareness & asking why. Pt does note fatigue after walking 1 lap & returns to bed. Pt would benefit from ongoing acute PT services to address stairs without rails with LRAD as pt has 2 steps to enter without rails at home.     Follow Up Recommendations  Home health PT;Supervision for mobility/OOB     Equipment Recommendations  Rolling walker with 5" wheels    Recommendations for Other Services       Precautions / Restrictions Precautions Precautions: Fall Restrictions Weight Bearing Restrictions: No    Mobility  Bed Mobility Overal bed mobility: Modified Independent             General bed mobility comments: supine<>sit with HOB elevated, bed rails    Transfers Overall transfer level: Needs assistance   Transfers: Sit to/from Stand Sit to Stand: Supervision         General transfer comment: cuing for safe hand placement as pt initially attempting to stand with BUE on RW  Ambulation/Gait Ambulation/Gait assistance: Min guard;Supervision Gait Distance (Feet): 200 Feet Assistive device: Rolling walker (2 wheeled) Gait  Pattern/deviations: Decreased step length - right;Decreased step length - left;Decreased stride length;Scissoring Gait velocity: decreased   General Gait Details: supervision for gait, increasing to CGA, pt with scissoring gait that worsens with fatigue & when PT attempts to educate pt on this she states "that's how I walk"   Stairs             Wheelchair Mobility    Modified Rankin (Stroke Patients Only)       Balance Overall balance assessment: Mild deficits observed, not formally tested;Needs assistance Sitting-balance support: Feet supported;No upper extremity supported Sitting balance-Leahy Scale: Good Sitting balance - Comments: supervision sitting EOB     Standing balance-Leahy Scale: Fair                              Cognition   Behavior During Therapy: Flat affect (appears to be bothered by PT presence & asking pt to participate but pt is willing to ambulate) Overall Cognitive Status: Within Functional Limits for tasks assessed                                 General Comments: Demonstrates decreased awareness. PT educates pt on recommendation of supervision for mobility at d/c with pt asking "why" demonstrating impaired awareness/insight re: current functional deficits.      Exercises      General Comments General comments (skin integrity, edema, etc.): SpO2 lowest 89% but quickly recovers to >  90% on room air, HR up to 127 bpm with gait      Pertinent Vitals/Pain Pain Assessment: No/denies pain    Home Living                      Prior Function            PT Goals (current goals can now be found in the care plan section) Acute Rehab PT Goals Patient Stated Goal: go home PT Goal Formulation: With patient Time For Goal Achievement: 01/16/21 Potential to Achieve Goals: Good Progress towards PT goals: Progressing toward goals    Frequency    Min 2X/week      PT Plan Discharge plan needs to be updated     Co-evaluation              AM-PAC PT "6 Clicks" Mobility   Outcome Measure  Help needed turning from your back to your side while in a flat bed without using bedrails?: None Help needed moving from lying on your back to sitting on the side of a flat bed without using bedrails?: None Help needed moving to and from a bed to a chair (including a wheelchair)?: A Little Help needed standing up from a chair using your arms (e.g., wheelchair or bedside chair)?: A Little Help needed to walk in hospital room?: A Little Help needed climbing 3-5 steps with a railing? : A Lot 6 Click Score: 19    End of Session Equipment Utilized During Treatment: Gait belt Activity Tolerance: Patient tolerated treatment well Patient left: in bed;with call bell/phone within reach;with bed alarm set Nurse Communication: Mobility status (vitals during session) PT Visit Diagnosis: Unsteadiness on feet (R26.81);Muscle weakness (generalized) (M62.81);Difficulty in walking, not elsewhere classified (R26.2)     Time: 1003-1016 PT Time Calculation (min) (ACUTE ONLY): 13 min  Charges:  $Therapeutic Activity: 8-22 mins                     Aleda Grana, PT, DPT 01/03/21, 10:25 AM    Sandi Mariscal 01/03/2021, 10:24 AM

## 2021-01-03 NOTE — Progress Notes (Addendum)
Progress Note  Patient Name: Cynthia Lucas Date of Encounter: 01/03/2021  Primary Cardiologist: New to [facility] - consult by Kirke Corin  Subjective   Maintaining sinus rhythm following conversion at 16:42 on 4/1 without significant post termination pause.  Status post EGD on 4/2 without abnormality.  Started on Eliquis on 4/2.  Metoprolol and diltiazem consolidated.  Hemoglobin trended 10.3-9.8.  Potassium 3.1.  Renal function continues to improve with a trend of 47/2.44-38/1.94.  Magnesium 1.6. No chest pain, dyspnea, palpitations, dizziness, presyncope, syncope, hematochezia, melena, hematuria, hemoptysis, or hematemesis.   Inpatient Medications    Scheduled Meds: . apixaban  5 mg Oral BID  . atorvastatin  40 mg Oral QHS  . brimonidine  1 drop Both Eyes BID   And  . timolol  1 drop Both Eyes BID  .  ceFAZolin (ANCEF) IV  1 g Intravenous Q12H  . Chlorhexidine Gluconate Cloth  6 each Topical Daily  . diltiazem  180 mg Oral Daily  . feeding supplement  1 Container Oral TID BM  . insulin aspart  0-20 Units Subcutaneous TID WC  . insulin glargine  20 Units Subcutaneous Daily  . iron polysaccharides  150 mg Oral Daily  . latanoprost  1 drop Left Eye QHS  . magnesium oxide  400 mg Oral Daily  . metoprolol tartrate  50 mg Oral BID  . pantoprazole  40 mg Oral BID  . potassium chloride  40 mEq Oral Once  . tamsulosin  0.4 mg Oral Daily   Continuous Infusions: . sodium chloride Stopped (01/02/21 1237)  . magnesium sulfate bolus IVPB     PRN Meds: sodium chloride, acetaminophen **OR** acetaminophen, HYDROcodone-acetaminophen, ondansetron **OR** ondansetron (ZOFRAN) IV, traZODone   Vital Signs    Vitals:   01/02/21 2000 01/02/21 2300 01/03/21 0400 01/03/21 0821  BP: 117/62 121/67 118/67 137/67  Pulse: 77 71 66 70  Resp: (!) 23 17 16 13   Temp: 97.7 F (36.5 C)  98.7 F (37.1 C) 99.1 F (37.3 C)  TempSrc: Oral  Axillary Oral  SpO2: 97% 95% 95% 96%  Weight:      Height:         Intake/Output Summary (Last 24 hours) at 01/03/2021 0829 Last data filed at 01/03/2021 03/05/2021 Gross per 24 hour  Intake 947.19 ml  Output 900 ml  Net 47.19 ml   Filed Weights   12/29/20 0400 12/29/20 0754 12/31/20 0300  Weight: 94.7 kg 96.3 kg 102.2 kg    Telemetry    SR with a short atrial run - Personally Reviewed  ECG    No new tracings - Personally Reviewed  Physical Exam   GEN: No acute distress.   Neck: No JVD. Cardiac: RRR, no murmurs, rubs, or gallops.  Respiratory: Clear to auscultation bilaterally.  GI: Soft, nontender, non-distended.   MS: No edema; No deformity. Neuro:  Alert and oriented x 3; Nonfocal.  Psych: Normal affect.  Labs    Chemistry Recent Labs  Lab 12/27/20 1841 12/28/20 0502 12/29/20 0456 12/30/20 0341 12/30/20 1503 01/01/21 0539 01/02/21 0543 01/03/21 0427  NA 137   < > 131* 138   < > 139 141 138  K 2.9*   < > 3.3* 2.7*   < > 3.5 3.1* 3.1*  CL 100   < > 98 98   < > 98 96* 96*  CO2 21*   < > 21* 27   < > 31 35* 32  GLUCOSE 441*   < > 543* 133*   < >  196* 178* 155*  BUN 73*   < > 87* 88*   < > 68* 47* 38*  CREATININE 6.56*   < > 7.31* 6.31*   < > 3.45* 2.44* 1.94*  CALCIUM 8.3*   < > 7.0* 6.8*   < > 7.7* 7.9* 7.8*  PROT 7.9  --  6.0* 5.8*  --   --   --   --   ALBUMIN 3.3*  --  2.4* 2.2*  --   --   --   --   AST 38  --  25 23  --   --   --   --   ALT 21  --  20 14  --   --   --   --   ALKPHOS 58  --  55 53  --   --   --   --   BILITOT 0.7  --  0.5 0.6  --   --   --   --   GFRNONAA 6*   < > 6* 7*   < > 14* 21* 28*  ANIONGAP 16*   < > 12 13   < > 10 10 10    < > = values in this interval not displayed.     Hematology Recent Labs  Lab 01/01/21 0539 01/02/21 0543 01/03/21 0427  WBC 13.1* 11.9* 9.9  RBC 4.10 3.89 3.74*  HGB 10.8* 10.3* 9.8*  HCT 32.3* 31.5* 31.2*  MCV 78.8* 81.0 83.4  MCH 26.3 26.5 26.2  MCHC 33.4 32.7 31.4  RDW 16.4* 16.1* 16.3*  PLT 409* 448* 446*    Cardiac EnzymesNo results for input(s): TROPONINI  in the last 168 hours. No results for input(s): TROPIPOC in the last 168 hours.   BNP Recent Labs  Lab 12/27/20 1841  BNP 530.7*     DDimer No results for input(s): DDIMER in the last 168 hours.   Radiology    No results found.  Cardiac Studies   2D echo 12/29/2020: 1. Left ventricular ejection fraction, by estimation, is 55 to 60%. The  left ventricle has normal function. The left ventricle has no regional  wall motion abnormalities. There is mild left ventricular hypertrophy.  Left ventricular diastolic function  could not be evaluated.  2. Right ventricular systolic function is normal. The right ventricular  size is normal. There is normal pulmonary artery systolic pressure.  3. Left atrial size was moderately dilated.  4. The mitral valve is normal in structure. Mild to moderate mitral valve  regurgitation. No evidence of mitral stenosis.  5. The aortic valve is normal in structure. Aortic valve regurgitation is  mild. Mild aortic valve sclerosis is present, with no evidence of aortic  valve stenosis.  6. Aortic dilatation noted. There is mild dilatation of the ascending  aorta, measuring 41 mm.  7. Only parasternal views.   Patient Profile     70 y.o. female with history of DM2, essential tremor, sleep apnea, HTN, and GERD who was admitted with abdominal pain and coffee-ground emesis with mild anemia and found to have acute renal failure in the setting of obstructive uropathy and pyelonephritis who we are seeing for new onset A. fib with RVR of unknown chronicity.  Assessment & Plan    1. New onset A. fib with RVR: -Chronicity remains uncertain at this time -Atrial arrhythmia diagnosed in the setting of acute renal failure in the setting of possible obstructive uropathy with pyelonephritis and suspected GI bleed -She converted to sinus rhythm  at 16:42 on 4/1 as outlined above -Continue Lopressor 50 mg bid and Cardizem CD 180 mg daily -CHA2DS2-VASc of 4,   -Initially, anticoagulation was deferred due to suspected GI bleed and ARF. However, EGD was unrevealing this morning with ok to proceed with OAC and renal function is improving -Eliquis 5 mg bid (she does not meet reduced dosing criteria), was started on 4/2  -Hemoglobin trended 10.3-9.8 as outlined above, no obvious source of bleed -TSH normal  2.  Elevated troponin: -Possibly supply demand ischemia in the setting of A. fib with RVR in the context of acute renal failure and suspected GI bleed -Echo this admission showed normal LV systolic function and wall motion -Consider outpatient Lexiscan MPI or coronary CTA  3.  ARF: -Felt to be secondary to obstructive uropathy with pyelonephritis -Renal function continues to improve -Nephrology is following  4. Suspected GI bleed: -EGD 4/2 unrevealing -Hemoglobin trending down to 9.8 this morning -Janssen Zee need close monitoring on Eliquis and if this continues to downtrend OAC may need to be temporarily suspended until source of her anemia is identified and resolved  5. Hypomagnesemia: -We Detrell Umscheid give another run of IV magnesium this morning  6. Hypokalemia: -We Shylyn Younce replete     For questions or updates, please contact [facility] Please consult [email or web address] for contact info under Cardiology/STEMI.    Signed, Eula Listen, PA-C [facility] Pager: [phone] 01/03/2021, 8:29 AM  I have seen and examined this patient with Eula Listen.  Agree with above, note added to reflect my findings.  On exam, RRR, no murmurs. Remains in sinus rhythm. No further cardiology work up at this time. Would continue with current medications. Have arranged for cardiology follow up in clinic.    Louella Medaglia M. Yanessa Hocevar MD 01/03/2021 11:58 AM

## 2021-01-04 ENCOUNTER — Encounter: Payer: Self-pay | Admitting: Internal Medicine

## 2021-01-04 ENCOUNTER — Telehealth: Payer: Self-pay | Admitting: Cardiovascular Disease

## 2021-01-04 NOTE — Telephone Encounter (Signed)
Attempted to schedule no ans no vm  

## 2021-01-04 NOTE — Telephone Encounter (Signed)
-----   Message from Sondra Barges, PA-C sent at 01/02/2021 11:10 AM EDT ----- Please schedule patient for hospital follow up in 7-10 days. Thanks!

## 2021-01-21 ENCOUNTER — Other Ambulatory Visit: Payer: Self-pay

## 2021-01-21 ENCOUNTER — Emergency Department: Payer: Medicare Other

## 2021-01-21 ENCOUNTER — Emergency Department
Admission: EM | Admit: 2021-01-21 | Discharge: 2021-01-21 | Disposition: A | Discharge status: Home / Self Care | Payer: Medicare Other | Attending: Emergency Medicine | Admitting: Emergency Medicine

## 2021-01-21 DIAGNOSIS — I4891 Unspecified atrial fibrillation: Secondary | ICD-10-CM | POA: Diagnosis not present

## 2021-01-21 DIAGNOSIS — I10 Essential (primary) hypertension: Secondary | ICD-10-CM | POA: Diagnosis not present

## 2021-01-21 DIAGNOSIS — Z7984 Long term (current) use of oral hypoglycemic drugs: Secondary | ICD-10-CM | POA: Diagnosis not present

## 2021-01-21 DIAGNOSIS — Z7901 Long term (current) use of anticoagulants: Secondary | ICD-10-CM | POA: Insufficient documentation

## 2021-01-21 DIAGNOSIS — Z79899 Other long term (current) drug therapy: Secondary | ICD-10-CM | POA: Insufficient documentation

## 2021-01-21 DIAGNOSIS — R109 Unspecified abdominal pain: Secondary | ICD-10-CM | POA: Diagnosis present

## 2021-01-21 DIAGNOSIS — N3001 Acute cystitis with hematuria: Secondary | ICD-10-CM | POA: Insufficient documentation

## 2021-01-21 DIAGNOSIS — Z9660 Presence of unspecified orthopedic joint implant: Secondary | ICD-10-CM | POA: Diagnosis not present

## 2021-01-21 DIAGNOSIS — E119 Type 2 diabetes mellitus without complications: Secondary | ICD-10-CM | POA: Insufficient documentation

## 2021-01-21 DIAGNOSIS — R110 Nausea: Secondary | ICD-10-CM

## 2021-01-21 LAB — URINALYSIS, COMPLETE (UACMP) WITH MICROSCOPIC
Bilirubin Urine: NEGATIVE
Glucose, UA: NEGATIVE mg/dL
Ketones, ur: NEGATIVE mg/dL
Nitrite: NEGATIVE
Protein, ur: 100 mg/dL — AB
RBC / HPF: >50 RBC/hpf — ABNORMAL HIGH (ref 0–5)
Specific Gravity, Urine: 1.017 (ref 1.005–1.030)
pH: 5 (ref 5.0–8.0)

## 2021-01-21 LAB — CBC
HCT: 37.4 % (ref 36.0–46.0)
Hemoglobin: 12.2 g/dL (ref 12.0–15.0)
MCH: 26.5 pg (ref 26.0–34.0)
MCHC: 32.6 g/dL (ref 30.0–36.0)
MCV: 81.1 fL (ref 80.0–100.0)
Platelets: 288 10*3/uL (ref 150–400)
RBC: 4.61 MIL/uL (ref 3.87–5.11)
RDW: 14.8 % (ref 11.5–15.5)
WBC: 8.4 10*3/uL (ref 4.0–10.5)
nRBC: 0 % (ref 0.0–0.2)

## 2021-01-21 LAB — LACTIC ACID, PLASMA
Lactic Acid, Venous: 2.5 mmol/L (ref 0.5–1.9)
Lactic Acid, Venous: 1.2 mmol/L (ref 0.5–1.9)

## 2021-01-21 LAB — BASIC METABOLIC PANEL
Anion gap: 12 (ref 5–15)
BUN: 32 mg/dL — ABNORMAL HIGH (ref 8–23)
CO2: 24 mmol/L (ref 22–32)
Calcium: 9.6 mg/dL (ref 8.9–10.3)
Chloride: 103 mmol/L (ref 98–111)
Creatinine, Ser: 1.97 mg/dL — ABNORMAL HIGH (ref 0.44–1.00)
GFR, Estimated: 27 mL/min — ABNORMAL LOW (ref >60)
Glucose, Bld: 160 mg/dL — ABNORMAL HIGH (ref 70–99)
Potassium: 4 mmol/L (ref 3.5–5.1)
Sodium: 139 mmol/L (ref 135–145)

## 2021-01-21 LAB — HEPATIC FUNCTION PANEL
ALT: 15 U/L (ref 0–44)
AST: 20 U/L (ref 15–41)
Albumin: 4.1 g/dL (ref 3.5–5.0)
Alkaline Phosphatase: 92 U/L (ref 38–126)
Bilirubin, Direct: <0.1 mg/dL (ref 0.0–0.2)
Total Bilirubin: 0.5 mg/dL (ref 0.3–1.2)
Total Protein: 8.7 g/dL — ABNORMAL HIGH (ref 6.5–8.1)

## 2021-01-21 MED ORDER — ONDANSETRON 4 MG PO TBDP: 4.0000 mg | ORAL_TABLET | Freq: Three times a day (TID) | ORAL | 0 refills | Status: DC | PRN

## 2021-01-21 MED ORDER — MORPHINE SULFATE (PF) 4 MG/ML IV SOLN
4.0000 mg | Freq: Once | INTRAVENOUS | Status: AC
  Administered 2021-01-21: 4 mg via INTRAVENOUS
  Filled 2021-01-21: qty 1

## 2021-01-21 MED ORDER — SODIUM CHLORIDE 0.9 % IV SOLN
2.0000 g | Freq: Once | INTRAVENOUS | Status: AC
  Administered 2021-01-21: 2 g via INTRAVENOUS
  Filled 2021-01-21 (×2): qty 20

## 2021-01-21 MED ORDER — CEFPODOXIME PROXETIL 200 MG PO TABS: 200.0000 mg | ORAL_TABLET | Freq: Two times a day (BID) | ORAL | 0 refills | Status: AC

## 2021-01-21 MED ORDER — OXYCODONE-ACETAMINOPHEN 5-325 MG PO TABS: 1.0000 | ORAL_TABLET | Freq: Three times a day (TID) | ORAL | 0 refills | Status: DC | PRN

## 2021-01-21 MED ORDER — SODIUM CHLORIDE 0.9 % IV BOLUS
1000.0000 mL | Freq: Once | INTRAVENOUS | Status: AC
  Administered 2021-01-21: 1000 mL via INTRAVENOUS

## 2021-01-21 MED ORDER — ONDANSETRON HCL 4 MG/2ML IJ SOLN
4.0000 mg | Freq: Once | INTRAMUSCULAR | Status: AC
  Administered 2021-01-21: 4 mg via INTRAVENOUS
  Filled 2021-01-21: qty 2

## 2021-01-21 NOTE — ED Notes (Signed)
Lactic 2.5 per lab, MD Issacs made aware.

## 2021-01-21 NOTE — ED Triage Notes (Signed)
Pt to ED POV for left side flank pain for over a week. Vomiting since last night.  Ambulatory to treatment room  Denies pain at this time

## 2021-01-21 NOTE — ED Notes (Signed)
Pt with c/o nausea, see MAR.Pt asking for something to eat and drink.  Pt may eat and drink per EDP, pt given ginger ale and saltines. Pt tolerating well at present.

## 2021-01-21 NOTE — ED Notes (Signed)
Pharmacy called for missing antibiotic, none in pyxis

## 2021-01-21 NOTE — ED Provider Notes (Addendum)
[facility] Emergency Department Provider Note  ____________________________________________   Event Date/Time   First MD Initiated Contact with Patient 01/21/21 1255     (approximate)  I have reviewed the triage vital signs and the nursing notes.   HISTORY  Chief Complaint Flank Pain    HPI Cynthia Lucas is a 70 y.o. female with history of hypertension, recent renal stent placement for pyelonephritis and infected stone, here with suprapubic pain.  The patient states that over the last several days, she has had recurrence of suprapubic pain.  She says occasional radiation to her bilateral flanks.  She has had some mild nausea.  No vomiting.  No diarrhea.  She states that she has had no fevers.  Symptoms do not feel nearly as severe as when she was admitted with pyelonephritis.  She currently has a stent in her right kidney, with scheduled follow-up with urology in 2 weeks.  Denies any fevers.  She states she told her PCP about her symptoms, and was told to come for evaluation imaging.  No other complaints.        Past Medical History:  Diagnosis Date  . Cough    CHRONIC  . Diabetes mellitus without complication (HCC)   . GERD (gastroesophageal reflux disease)   . History of kidney stones   . Hypertension   . Sleep apnea    no cpap  . Tremor    HEAD    Patient Active Problem List   Diagnosis Date Noted  . Sepsis (HCC) 12/29/2020  . Acute pyelonephritis 12/29/2020  . Hydronephrosis of right kidney 12/29/2020  . Kidney stone on right side 12/29/2020  . Acute renal failure syndrome (HCC) 12/28/2020  . Metabolic acidosis 12/28/2020  . Hypokalemia 12/28/2020  . Atrial fibrillation with rapid ventricular response (HCC) 12/28/2020  . Uncontrolled type 2 diabetes mellitus with hyperglycemia, without long-term current use of insulin (HCC) 12/28/2020  . GI bleeding 12/27/2020  . 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 Obstructive uropathy 01/07/2019     Past Surgical History:  Procedure Laterality Date  . ABDOMINAL HYSTERECTOMY    . CATARACT EXTRACTION W/PHACO Left 03/07/2018   Procedure: CATARACT EXTRACTION PHACO AND INTRAOCULAR LENS PLACEMENT (IOC);  Surgeon: Galen Manila, MD;  Location: [facility] OR[facility];  Service: Ophthalmology;  Laterality: Left;  Lot # [id number] H [place]: 04:19.8 AP%: 19.9 CDE: 51.70  . CATARACT EXTRACTION W/PHACO Right 07/07/2020   Procedure: CATARACT EXTRACTION PHACO AND INTRAOCULAR LENS PLACEMENT (IOC) RIGHT DIABETIC 7.75 00:42.8;  Surgeon: Galen Manila, MD;  Location: [facility];  Service: Ophthalmology;  Laterality: Right;  diabetic - oral meds  . CESAREAN SECTION    . COLONOSCOPY    . CYSTOSCOPY W/ RETROGRADES Right 01/07/2019   Procedure: CYSTOSCOPY WITH RETROGRADE PYELOGRAM;  Surgeon: Riki Altes, MD;  Location: [facility] OR[facility];  Service: Urology;  Laterality: Right;  . CYSTOSCOPY W/ URETERAL STENT PLACEMENT Right 12/28/2020   Procedure: CYSTOSCOPY WITH RETROGRADE PYELOGRAM/URETERAL STENT PLACEMENT;  Surgeon: Bjorn Pippin, MD;  Location: [facility];  Service: Urology;  Laterality: Right;  . CYSTOSCOPY WITH STENT PLACEMENT Right 01/07/2019   Procedure: CYSTOSCOPY WITH STENT PLACEMENT;  Surgeon: Riki Altes, MD;  Location: [facility];  Service: Urology;  Laterality: Right;  . CYSTOSCOPY WITH URETEROSCOPY, STONE BASKETRY AND STENT PLACEMENT Right 01/29/2019   Procedure: CYSTOSCOPY/URETEROSCOPY/HOLMIUM LASER/STENT EXCHANGE;  Surgeon: Riki Altes, MD;  Location: [facility];  Service: Urology;  Laterality: Right;  . ESOPHAGOGASTRODUODENOSCOPY N/A 01/02/2021   Procedure: ESOPHAGOGASTRODUODENOSCOPY (EGD);  Surgeon: [place], [place]  K, MD;  Location: [facility];  Service: Gastroenterology;  Laterality: N/A;  . JOINT REPLACEMENT      Prior to Admission medications   Medication Sig Start Date End Date Taking? Authorizing Provider  cefpodoxime (VANTIN) 200 MG tablet Take 1 tablet (200 mg total) by mouth 2 (two) times  daily for 10 days. 01/21/21 01/31/21 Yes Shaune Pollack, MD  ondansetron (ZOFRAN ODT) 4 MG disintegrating tablet Take 1 tablet (4 mg total) by mouth every 8 (eight) hours as needed for nausea or vomiting. 01/21/21  Yes Shaune Pollack, MD  oxyCODONE-acetaminophen (PERCOCET) 5-325 MG tablet Take 1 tablet by mouth every 8 (eight) hours as needed for moderate pain or severe pain. 01/21/21 01/21/22 Yes Shaune Pollack, MD  ACCU-CHEK AVIVA PLUS test strip  04/24/19   [provider]  apixaban (ELIQUIS) 5 MG TABS tablet Take 1 tablet (5 mg total) by mouth 2 (two) times daily. Blood thinner. 01/03/21 04/03/21  Darlin Priestly, MD  atorvastatin (LIPITOR) 40 MG tablet Take 40 mg by mouth at bedtime.     [provider]  brimonidine-timolol (COMBIGAN) 0.2-0.5 % ophthalmic solution Place 1 drop into both eyes every 12 (twelve) hours.    [provider]  diltiazem (CARDIZEM CD) 180 MG 24 hr capsule Take 1 capsule (180 mg total) by mouth daily. To control your heart rate. 01/03/21 04/03/21  Darlin Priestly, MD  iron polysaccharides (NIFEREX) 150 MG capsule Take 1 capsule (150 mg total) by mouth daily. Can take any over-the-counter. 01/03/21   Darlin Priestly, MD  LUMIGAN 0.01 % SOLN Place 1 drop into the left eye at bedtime.  01/17/19   [provider]  magnesium oxide (MAG-OX) 400 (241.3 Mg) MG tablet Take 1 tablet (400 mg total) by mouth daily. 01/03/21 02/02/21  Darlin Priestly, MD  metFORMIN (GLUCOPHAGE) 500 MG tablet Take 2 tablets (1,000 mg total) by mouth 2 (two) times daily with a meal. 01/03/21 04/03/21  Darlin Priestly, MD  metoprolol tartrate (LOPRESSOR) 50 MG tablet Take 1 tablet (50 mg total) by mouth 2 (two) times daily. To control your heart rate. 01/03/21 04/03/21  Darlin Priestly, MD  pantoprazole (PROTONIX) 40 MG tablet Take 1 tablet (40 mg total) by mouth 2 (two) times daily. 01/03/21 04/03/21  Darlin Priestly, MD  potassium chloride SA (KLOR-CON) 20 MEQ tablet Take 2 tablets (40 mEq total) by mouth daily for 5 days. 01/03/21 01/08/21   Darlin Priestly, MD  tamsulosin (FLOMAX) 0.4 MG CAPS capsule Take 1 capsule (0.4 mg total) by mouth daily. To help with spasm caused by ureteral stent. 01/03/21 02/02/21  Darlin Priestly, MD    Allergies Other  Family History  Problem Relation Age of Onset  . Heart disease Mother     Social History Social History   Tobacco Use  . Smoking status: Never Smoker  . Smokeless tobacco: Never Used  Vaping Use  . Vaping Use: Never used  Substance Use Topics  . Alcohol use: Yes    Comment: OCCAS  . Drug use: Never    Review of Systems  Review of Systems  Constitutional: Positive for fatigue. Negative for chills and fever.  HENT: Negative for sore throat.   Respiratory: Negative for shortness of breath.   Cardiovascular: Negative for chest pain.  Gastrointestinal: Positive for abdominal pain and nausea.  Genitourinary: Positive for dysuria and frequency. Negative for flank pain.  Musculoskeletal: Negative for neck pain.  Skin: Negative for rash and wound.  Allergic/Immunologic: Negative for immunocompromised state.  Neurological: Negative for  weakness and numbness.  Hematological: Does not bruise/bleed easily.     ____________________________________________  PHYSICAL EXAM:      VITAL SIGNS: ED Triage Vitals [01/21/21 1245]  Enc Vitals Group     BP 135/69     Pulse Rate 95     Resp 20     Temp 98 F (36.7 C)     Temp Source Oral     SpO2 98 %     Weight 197 lb (89.4 kg)     Height 5\' 7"  (1.702 m)     Head Circumference      Peak Flow      Pain Score 0     Pain Loc      Pain Edu?      Excl. in GC?      Physical Exam Vitals and nursing note reviewed.  Constitutional:      General: She is not in acute distress.    Appearance: She is well-developed.  HENT:     Head: Normocephalic and atraumatic.  Eyes:     Conjunctiva/sclera: Conjunctivae normal.  Cardiovascular:     Rate and Rhythm: Normal rate and regular rhythm.     Heart sounds: Normal heart sounds. No murmur  heard. No friction rub.  Pulmonary:     Effort: Pulmonary effort is normal. No respiratory distress.     Breath sounds: Normal breath sounds. No wheezing or rales.  Abdominal:     General: There is no distension.     Palpations: Abdomen is soft.     Tenderness: There is abdominal tenderness (Mild, suprapubic).  Musculoskeletal:     Cervical back: Neck supple.  Skin:    General: Skin is warm.     Capillary Refill: Capillary refill takes less than 2 seconds.  Neurological:     Mental Status: She is alert and oriented to person, place, and time.     Motor: No abnormal muscle tone.       ____________________________________________   LABS (all labs ordered are listed, but only abnormal results are displayed)  Labs Reviewed  URINALYSIS, COMPLETE (UACMP) WITH MICROSCOPIC - Abnormal; Notable for the following components:      Result Value   Color, Urine YELLOW (*)    APPearance CLOUDY (*)    Hgb urine dipstick MODERATE (*)    Protein, ur 100 (*)    Leukocytes,Ua MODERATE (*)    RBC / HPF >50 (*)    Bacteria, UA MANY (*)    All other components within normal limits  BASIC METABOLIC PANEL - Abnormal; Notable for the following components:   Glucose, Bld 160 (*)    BUN 32 (*)    Creatinine, Ser 1.97 (*)    GFR, Estimated 27 (*)    All other components within normal limits  LACTIC ACID, PLASMA - Abnormal; Notable for the following components:   Lactic Acid, Venous 2.5 (*)    All other components within normal limits  HEPATIC FUNCTION PANEL - Abnormal; Notable for the following components:   Total Protein 8.7 (*)    All other components within normal limits  URINE CULTURE  CBC  LACTIC ACID, PLASMA    ____________________________________________  EKG:  ________________________________________  RADIOLOGY All imaging, including plain films, CT scans, and ultrasounds, independently reviewed by me, and interpretations confirmed via formal radiology reads.  ED MD  interpretation:   CT stone: No acute findings, right-sided J stent is in place, with decompression of the renal collecting system, stone has migrated  to the right inferior renal collecting system.  Official radiology report(s): CT Renal Stone Study  Result Date: 01/21/2021 CLINICAL DATA:  Flank pain.  Rule out kidney stone EXAM: CT ABDOMEN AND PELVIS WITHOUT CONTRAST TECHNIQUE: Multidetector CT imaging of the abdomen and pelvis was performed following the standard protocol without IV contrast. COMPARISON:  12/28/2020 FINDINGS: Lower chest: Coronary artery atherosclerotic calcifications. No acute findings. Hepatobiliary: No focal liver abnormality is seen. No gallstones, gallbladder wall thickening, or biliary dilatation. Pancreas: Unremarkable. No pancreatic ductal dilatation or surrounding inflammatory changes. Spleen: Normal in size without focal abnormality. Adrenals/Urinary Tract: Normal adrenal glands. Normal appearance of the left kidney. No left-sided nephrolithiasis, hydronephrosis or hydroureter. Retrograde migration of previous right UPJ calculus into the inferior pole of the right kidney. A right-sided double-J nephroureteral stent is been placed and appears to be in appropriate position. Interval decompression of previous dilated right renal collecting system. Urinary bladder appears normal. Stomach/Bowel: Stomach is within normal limits. Appendix appears normal. No evidence of bowel wall thickening, distention, or inflammatory changes. A few scattered distal colonic diverticula noted without signs of acute diverticulitis. Vascular/Lymphatic: Aortic atherosclerosis. No enlarged abdominal or pelvic lymph nodes. Reproductive: Status post hysterectomy. No adnexal masses. Other: There is no free fluid or fluid collections. No abdominal wall hernia. Musculoskeletal: No acute or significant osseous findings. 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 IMPRESSION: 1. No acute findings within the  abdomen or pelvis. 2. Right-sided double-J nephroureteral stent has been placed with decompression of previous dilated right renal collecting system. Retrograde migration of previous right UPJ calculus into the inferior pole collecting system of the right kidney noted. 3. Coronary artery calcifications noted. 4. Aortic atherosclerosis. Aortic Atherosclerosis (ICD10-I70.0). Electronically Signed   By: Signa Kell M.D.   On: 01/21/2021 14:05    ____________________________________________  PROCEDURES   Procedure(s) performed (including Critical Care):  Procedures  ____________________________________________  INITIAL IMPRESSION / MDM / ASSESSMENT AND PLAN / ED COURSE  As part of my medical decision making, I reviewed the following data within the electronic MEDICAL RECORD NUMBER Nursing notes reviewed and incorporated, Old chart reviewed, Notes from prior ED visits, and [place] Controlled Substance Database       *Mischelle Reeg was evaluated in Emergency Department on 01/21/2021 for the symptoms described in the history of present illness. She was evaluated in the context of the global COVID-19 pandemic, which necessitated consideration that the patient might be at risk for infection with the SARS-CoV-2 virus that causes COVID-19. Institutional protocols and algorithms that pertain to the evaluation of patients at risk for COVID-19 are in a state of rapid change based on information released by regulatory bodies including the CDC and federal and state organizations. These policies and algorithms were followed during the patient's care in the ED.  Some ED evaluations and interventions may be delayed as a result of limited staffing during the pandemic.*     Medical Decision Making: Well-appearing 70 year old female here with suprapubic and bilateral flank pain.  Patient appears overall well and nontoxic.  She is afebrile.  Initial lactic acid 2.5, which I suspect is due to dehydration and poor p.o.  intake.  She has a normal white count, is afebrile, no signs of severe sepsis.  Repeat lactic acid is completely normal.  Otherwise, she appears very well and is tolerating p.o.  CT scan reviewed, shows decompression of the renal collecting system with no persistent stone.  No significant evidence of [place] on the CT.  Discussed case with urology on-call  Dr. Richardo Hanks, who recommends outpatient antibiotics and follow-up.  Bactrim initially recommended based on cultures, but given her chronic kidney disease, will treat with Vantin for broad-spectrum coverage. Repeat UCx sent. Will f/u as outpatient. Return precautions given.  ____________________________________________  FINAL CLINICAL IMPRESSION(S) / ED DIAGNOSES  Final diagnoses:  Acute cystitis with hematuria  Nausea     MEDICATIONS GIVEN DURING THIS VISIT:  Medications  sodium chloride 0.9 % bolus 1,000 mL (0 mLs Intravenous Stopped 01/21/21 1604)  ondansetron (ZOFRAN) injection 4 mg (4 mg Intravenous Given 01/21/21 1320)  morphine 4 MG/ML injection 4 mg (4 mg Intravenous Given 01/21/21 1321)  cefTRIAXone (ROCEPHIN) 2 g in sodium chloride 0.9 % 100 mL IVPB (2 g Intravenous New Bag/Given 01/21/21 1627)  ondansetron (ZOFRAN) injection 4 mg (4 mg Intravenous Given 01/21/21 1622)     ED Discharge Orders         Ordered    cefpodoxime (VANTIN) 200 MG tablet  2 times daily        01/21/21 1741    ondansetron (ZOFRAN ODT) 4 MG disintegrating tablet  Every 8 hours PRN        01/21/21 1741    oxyCODONE-acetaminophen (PERCOCET) 5-325 MG tablet  Every 8 hours PRN        01/21/21 1741           Note:  This document was prepared using Dragon voice recognition software and may include unintentional dictation errors.   Shaune Pollack, MD 01/21/21 Si [place]    Shaune Pollack, MD 01/21/21 [phone]

## 2021-01-23 LAB — URINE CULTURE: Culture: <10000 — AB

## 2021-01-27 ENCOUNTER — Ambulatory Visit: Payer: Medicare Other | Admitting: Urology

## 2021-01-29 ENCOUNTER — Encounter: Payer: Self-pay | Admitting: Urology

## 2021-02-03 ENCOUNTER — Other Ambulatory Visit: Payer: Self-pay | Admitting: Urology

## 2021-02-03 DIAGNOSIS — N200 Calculus of kidney: Secondary | ICD-10-CM

## 2021-02-04 ENCOUNTER — Encounter
Admission: RE | Admit: 2021-02-04 | Discharge: 2021-02-04 | Disposition: A | Discharge status: Home / Self Care | Payer: Medicare Other | Source: Ambulatory Visit | Attending: Urology | Admitting: Urology

## 2021-02-04 ENCOUNTER — Telehealth: Payer: Self-pay | Admitting: *Deleted

## 2021-02-04 ENCOUNTER — Ambulatory Visit (INDEPENDENT_AMBULATORY_CARE_PROVIDER_SITE_OTHER): Payer: Medicare Other | Admitting: Cardiovascular Disease

## 2021-02-04 ENCOUNTER — Other Ambulatory Visit: Payer: Self-pay

## 2021-02-04 ENCOUNTER — Encounter: Payer: Self-pay | Admitting: Cardiovascular Disease

## 2021-02-04 VITALS — BP 110/62 | HR 72 | Ht 67.0 in | Wt 199.0 lb

## 2021-02-04 DIAGNOSIS — R778 Other specified abnormalities of plasma proteins: Secondary | ICD-10-CM | POA: Diagnosis not present

## 2021-02-04 DIAGNOSIS — Z01810 Encounter for preprocedural cardiovascular examination: Secondary | ICD-10-CM | POA: Diagnosis not present

## 2021-02-04 DIAGNOSIS — I10 Essential (primary) hypertension: Secondary | ICD-10-CM | POA: Diagnosis not present

## 2021-02-04 DIAGNOSIS — I480 Paroxysmal atrial fibrillation: Secondary | ICD-10-CM | POA: Diagnosis not present

## 2021-02-04 HISTORY — DX: Unspecified atrial fibrillation: I48.91

## 2021-02-04 MED ORDER — HYDROCHLOROTHIAZIDE 25 MG PO TABS: 12.5000 mg | ORAL_TABLET | Freq: Every day | ORAL | 2 refills | Status: DC

## 2021-02-04 NOTE — Telephone Encounter (Signed)
Message was sent to the [facility] office for help in seeing where we may able to squeeze pt in for pre op appt. Surgery is set for tomorrow.

## 2021-02-04 NOTE — Telephone Encounter (Signed)
Our  office was able to have the pt seen today with Dr. Kirke Corin for pre op clearance. I will send clearance note to MD for appt today and send FYI to requesting office.

## 2021-02-04 NOTE — Progress Notes (Signed)
  Perioperative Services Pre-Admission/Anesthesia Testing    Date: 02/04/21  Name: Cynthia Lucas MRN:   [id number]  Re: Presurgical cardiovascular clearance   Case: [id number] Date/Time: 02/05/21 1212   Procedure: CYSTOSCOPY/URETEROSCOPY/HOLMIUM LASER/STENT EXCHANGE (Right )   Anesthesia type: Choice   Pre-op diagnosis: right renal stone   Location: [facility] OR ROOM 10 / [facility]   Surgeons: Sondra Come, MD    Patient scheduled for the above procedure TOMORROW (02/05/2021).  Of note, patient was not acutely added onto the PAT schedule today.  EMR briefly reviewed it is noted the patient was recently admitted to [facility] for urosepsis secondary to pyelonephritis.  CT imaging demonstrated a 2 x 1.5 cm UPJ calculus on the RIGHT with associated hydronephrosis. She was taken to the OR on 12/28/2020 for cystoscopy and ureteral stent placement.   During her admission, patient found to be new onset atrial fibrillation with RVR.  Patient was seen by cardiology during her admission.  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  Rate and rhythm controlled on oral diltiazem and metoprolol.  She was started on chronic daily anticoagulation therapy (apixaban); has been compliant with therapy with no evidence of GI bleeding.  Received communication today that patient needed stent exchange tomorrow. Spoke with primary attending surgeons office to determine need for apixaban washout period prior to procedure.  Per Dr. Richardo Hanks, he is comfortable with proceeding with planned interventional course with patient continuing this medication throughout the perioperative period.  Given her new atrial fibrillation diagnosis, presurgical cardiac clearance was sought by the PAT team. Of note, patient was to follow up with cardiology 7-10 days  after hospital visit, and despite calls placed to her attempting to schedule, patient never returned call.  Spoke with [employer] staff today regarding need for follow up. Dr. Kirke Corin was very graciously able to add this patient on to be see for clearance today, which is very much appreciated.  Per cardiology, "patient is currently with no anginal symptoms, and she is maintaining sinus rhythm.  She does complain of chronic fatigue, but functional capacity is </= 4 METS.  She can proceed with surgery as planned with no need for ischemic cardiac evaluation before the surgery.  Eliquis has been on hold,  and should be resumed postoperatively likely the following day as long as no contraindication from urology standpoint".  [name], MSN, APRN, FNP-C, CEN [facility]  Peri-operative Services Nurse Practitioner Phone: [phone] 02/04/21 2:00 PM

## 2021-02-04 NOTE — Telephone Encounter (Signed)
Request for pre-operative cardiac clearance Received: Today Karen Kitchens, NP  P Cv Div Preop Callback Request for pre-operative cardiac clearance:    1. What type of surgery is being performed?  RIGHT CYSTOSCOPY/URETEROSCOPY/HOLMIUM LASER/STENT EXCHANGE   2. When is this surgery scheduled?  02/05/2021; Patient seen by Purcell Mouton as an inpatient. Recent TTE. She is on apixaban and may continue per surgery.    3. Are there any medications that need to be held prior to surgery?  On APIXABAN, however surgeon ok with continuing.   4. Practice name and name of physician performing surgery?  Performing surgeon: Dr. Nickolas Madrid, MD  Requesting clearance: Honor Loh, FNP-C     5. Anesthesia type (none, local, MAC, general)? General   6. What is the office phone and fax number?   Phone: [phone]  Fax: [phone]   NOTES: Patient was an acute add on to the PAT schedule for today. Procedure is scheduled for tomorrow. Patient with recent hospital admission where she was diagnosed with new onset A.fib with RVR in the setting of sepsis. Patient seen by Purcell Mouton as an inpatient. Recent TTE. She is on apixaban and may continue per surgery.   ATTENTION: Unable to create telephone message as per your standard workflow. Directed by [facility] providers to send requests for cardiac clearance to this pool for appropriate distribution to provider covering pre-operative clearances.   Honor Loh, MSN, APRN, FNP-C, CEN  [facility]  Peri-operative Services Nurse Practitioner  Phone: [phone]  02/04/21 10:21 AM

## 2021-02-04 NOTE — Telephone Encounter (Signed)
   Name: Cynthia Lucas  DOB: Feb 05, 1951  MRN: [id number]  Primary Cardiologist: None  Chart reviewed as part of pre-operative protocol coverage. Patient has never actually been seen in our office. She was seen by our group for the first time during recent admission for atrial fibrillation and was advised to follow-up with [place] after discharge. Therefore, she will need a follow-up visit in order to better assess preoperative cardiovascular risk. Unfortunately, procedure is scheduled for tomorrow so it will likely need to be postponed.  Pre-op covering staff: - Please schedule appointment and call patient to inform them. If patient already had an upcoming appointment within acceptable timeframe, please add "pre-op clearance" to the appointment notes so provider is aware. - Please contact requesting surgeon's office via preferred method (i.e, phone, fax) to inform them of need for appointment prior to surgery.  Of note, patient is on Eliquis but surgeon OK with continuing it perioperatively.  Corrin Parker, PA-C  02/04/2021, 11:28 AM

## 2021-02-04 NOTE — Patient Instructions (Signed)
Your procedure is scheduled on: 02/05/21 Report to DAY SURGERY DEPARTMENT LOCATED ON 2ND FLOOR MEDICAL MALL ENTRANCE. To find out your arrival time please call [phone] between 1PM - 3PM on 02/04/21.  Remember: Instructions that are not followed completely may result in serious medical risk, up to and including death, or upon the discretion of your surgeon and anesthesiologist your surgery may need to be rescheduled.     _X__ 1. Do not eat food or drink any liquids after midnight the night before your procedure.                 No gum chewing or hard candies.   __X__2.  On the morning of surgery brush your teeth with toothpaste and water, you                 may rinse your mouth with mouthwash if you wish.  Do not swallow any              toothpaste of mouthwash.     _X__ 3.  No Alcohol for 24 hours before or after surgery.   _X__ 4.  Do Not Smoke or use e-cigarettes For 24 Hours Prior to Your Surgery.                 Do not use any chewable tobacco products for at least 6 hours prior to                 surgery.  ____  5.  Bring all medications with you on the day of surgery if instructed.   __X__  6.  Notify your doctor if there is any change in your medical condition      (cold, fever, infections).     Do not wear jewelry, make-up, hairpins, clips or nail polish. Do not wear lotions, powders, or perfumes.  Do not shave 48 hours prior to surgery. Men may shave face and neck. Do not bring valuables to the hospital.    [facility] is not responsible for any belongings or valuables.  Contacts, dentures/partials or body piercings may not be worn into surgery. Bring a case for your contacts, glasses or hearing aids, a denture cup will be supplied. Leave your suitcase in the car. After surgery it may be brought to your room. For patients admitted to the hospital, discharge time is determined by your treatment team.   Patients discharged the day of surgery will not be allowed to drive  home.   Please read over the following fact sheets that you were given:     __X__ Take these medicines the morning of surgery with A SIP OF WATER:    1. diltiazem (CARDIZEM CD) 180 MG 24 hr capsule  2. metoprolol tartrate (LOPRESSOR) 50 MG tablet  3. pantoprazole (PROTONIX) 40 MG tablet  4. tamsulosin (FLOMAX) 0.4 MG CAPS capsule  5. oxyCODONE-acetaminophen (PERCOCET) 5-325 MG tablet if needed  6.  ____ Fleet Enema (as directed)   ____ Use CHG Soap/SAGE wipes as directed  ____ Use inhalers on the day of surgery  __x__ Stop metformin/Janumet/Farxiga 2 days prior to surgery Pt told to hold metformin today 5/5   ____ Take 1/2 of usual insulin dose the night before surgery. No insulin the morning          of surgery.   __x__ Stop Blood Thinners Coumadin/Plavix/Xarelto/Pleta/Pradaxa/Eliquis/Effient/Aspirin  Pt told to hold eliquis today 5/5  __X__ Stop Anti-inflammatories 7 days before surgery such as Advil, Ibuprofen, Motrin,  BC or Goodies Powder, Naprosyn, Naproxen, Aleve, Aspirin    __X__ Stop all herbal supplements, fish oil or vitamin E until after surgery.    ____ Bring C-Pap to the hospital.     Instructions provided after PAT interview. Patient verbalized understanding.

## 2021-02-04 NOTE — Progress Notes (Signed)
Cardiology Office Note   Date:  02/04/2021   ID:  Cynthia Lucas, DOB August 07, 1951, MRN [id number]  PCP:  Toy Cookey, FNP  Cardiologist:   Lorine Bears, MD   Chief Complaint  Patient presents with  . Other    Urgent per pre admit for clearance. Patient states she has not taken any medicine today. Procedure is tomorrow. Meds reviewed verbally with patient.        History of Present Illness: Cynthia Lucas is a 70 y.o. female who presents for a follow-up visit regarding paroxysmal atrial fibrillation as well as preop cardiovascular evaluation for cystoscopy and ureteral stent exchange.  She has known history of essential hypertension, GERD, type 2 diabetes, sleep apnea not on CPAP and essential tremors.  She was hospitalized in March of this year with left flank pain and coffee-ground emesis. She was found to be in A. fib with RVR on presentation which was a new diagnosis.  In addition, she was noted to have acute renal failure with creatinine of 6.5 as well as a gradual drop in hemoglobin to 11.5.  She was treated with diltiazem for rate control.  Troponin was mildly elevated thought to be due to supply demand ischemia.  Echocardiogram showed normal LV systolic function, mild to moderate mitral regurgitation, moderately dilated left atrium and mildly dilated ascending aorta. She converted sinus rhythm during hospitalization.  EGD showed no significant abnormalities.  She was started on Eliquis before hospital discharge.  Acute renal failure was felt to be due to obstructive nephropathy with subsequent improvement after ureteral stent placement.  She has scheduled for ureteral stent exchange tomorrow.  She denies any chest pain or worsening dyspnea.  No palpitations since hospital discharge.  She lives with her son and she is relatively independent with activities of daily living.  She does report chronic fatigue.  Past Medical History:  Diagnosis Date  . Atrial fibrillation (HCC)   .  Cough    CHRONIC  . Diabetes mellitus without complication (HCC)   . GERD (gastroesophageal reflux disease)   . History of kidney stones   . Hypertension   . Sleep apnea    no cpap  . Tremor    HEAD    Past Surgical History:  Procedure Laterality Date  . ABDOMINAL HYSTERECTOMY    . CATARACT EXTRACTION W/PHACO Left 03/07/2018   Procedure: CATARACT EXTRACTION PHACO AND INTRAOCULAR LENS PLACEMENT (IOC);  Surgeon: Galen Manila, MD;  Location: [facility];  Service: Ophthalmology;  Laterality: Left;  Lot # [id number] H [place]: 04:19.8 AP%: 19.9 CDE: 51.70  . CATARACT EXTRACTION W/PHACO Right 07/07/2020   Procedure: CATARACT EXTRACTION PHACO AND INTRAOCULAR LENS PLACEMENT (IOC) RIGHT DIABETIC 7.75 00:42.8;  Surgeon: Galen Manila, MD;  Location[facility];  Service: Ophthalmology;  Laterality: Right;  diabetic - oral meds  . CESAREAN SECTION    . COLONOSCOPY    . CYSTOSCOPY W/ RETROGRADES Right 01/07/2019   Procedure: CYSTOSCOPY WITH RETROGRADE PYELOGRAM;  Surgeon: Riki Altes, MD;  Location: [facility];  Service: Urology;  Laterality: Right;  . CYSTOSCOPY W/ URETERAL STENT PLACEMENT Right 12/28/2020   Procedure: CYSTOSCOPY WITH RETROGRADE PYELOGRAM/URETERAL STENT PLACEMENT;  Surgeon: Bjorn Pippin, MD;  Location: [facility];  Service: Urology;  Laterality: Right;  . CYSTOSCOPY WITH STENT PLACEMENT Right 01/07/2019   Procedure: CYSTOSCOPY WITH STENT PLACEMENT;  Surgeon: Riki Altes, MD;  Location: [facility];  Service: Urology;  Laterality: Right;  . CYSTOSCOPY WITH URETEROSCOPY, STONE BASKETRY AND STENT PLACEMENT Right 01/29/2019  Procedure: CYSTOSCOPY/URETEROSCOPY/HOLMIUM LASER/STENT EXCHANGE;  Surgeon: Riki Altes, MD;  Location: [facility];  Service: Urology;  Laterality: Right;  . ESOPHAGOGASTRODUODENOSCOPY N/A 01/02/2021   Procedure: ESOPHAGOGASTRODUODENOSCOPY (EGD);  Surgeon: Toledo, Boykin Nearing, MD;  Location: [facility];  Service: Gastroenterology;  Laterality: N/A;  . JOINT  REPLACEMENT       Current Outpatient Medications  Medication Sig Dispense Refill  . ACCU-CHEK AVIVA PLUS test strip     . apixaban (ELIQUIS) 5 MG TABS tablet Take 1 tablet (5 mg total) by mouth 2 (two) times daily. Blood thinner. 60 tablet 2  . atorvastatin (LIPITOR) 40 MG tablet Take 40 mg by mouth at bedtime.     . brimonidine-timolol (COMBIGAN) 0.2-0.5 % ophthalmic solution Place 1 drop into both eyes every 12 (twelve) hours.    Marland Kitchen diltiazem (CARDIZEM CD) 180 MG 24 hr capsule Take 1 capsule (180 mg total) by mouth daily. To control your heart rate. 30 capsule 2  . hydrochlorothiazide (HYDRODIURIL) 25 MG tablet Take 25 mg by mouth daily.    . iron polysaccharides (NIFEREX) 150 MG capsule Take 1 capsule (150 mg total) by mouth daily. Can take any over-the-counter.    Marland Kitchen LUMIGAN 0.01 % SOLN Place 1 drop into the left eye at bedtime.     . Magnesium 400 MG TABS Take 1 tablet by mouth daily.    . metFORMIN (GLUCOPHAGE) 500 MG tablet Take 2 tablets (1,000 mg total) by mouth 2 (two) times daily with a meal. 120 tablet 2  . metoprolol tartrate (LOPRESSOR) 50 MG tablet Take 1 tablet (50 mg total) by mouth 2 (two) times daily. To control your heart rate. 60 tablet 2  . ondansetron (ZOFRAN ODT) 4 MG disintegrating tablet Take 1 tablet (4 mg total) by mouth every 8 (eight) hours as needed for nausea or vomiting. 20 tablet 0  . oxyCODONE-acetaminophen (PERCOCET) 5-325 MG tablet Take 1 tablet by mouth every 8 (eight) hours as needed for moderate pain or severe pain. 12 tablet 0  . pantoprazole (PROTONIX) 40 MG tablet Take 1 tablet (40 mg total) by mouth 2 (two) times daily. 60 tablet 2  . tamsulosin (FLOMAX) 0.4 MG CAPS capsule Take 0.4 mg by mouth.     No current facility-administered medications for this visit.    Allergies:   Other    Social History:  The patient  reports that she has never smoked. She has never used smokeless tobacco. She reports current alcohol use. She reports that she does not  use drugs.   Family History:  The patient's family history includes Heart disease in her mother.    ROS:  Please see the history of present illness.   Otherwise, review of systems are positive for none.   All other systems are reviewed and negative.    PHYSICAL EXAM: VS:  BP 110/62 (BP Location: Left Arm, Patient Position: Sitting, Cuff Size: Normal)   Pulse 72   Ht 5\' 7"  (1.702 m)   Wt 199 lb (90.3 kg)   SpO2 98%   BMI 31.17 kg/m  , BMI Body mass index is 31.17 kg/m. GEN: Well nourished, well developed, in no acute distress  HEENT: normal  Neck: no JVD, carotid bruits, or masses Cardiac: RRR; no murmurs, rubs, or gallops,no edema  Respiratory:  clear to auscultation bilaterally, normal work of breathing GI: soft, nontender, nondistended, + BS MS: no deformity or atrophy  Skin: warm and dry, no rash Neuro:  Strength and sensation are intact Psych: euthymic mood,  full affect   EKG:  EKG is ordered today. The ekg ordered today demonstrates normal sinus rhythm with no significant ST or T wave changes.   Recent Labs: 12/27/2020: B Natriuretic Peptide 530.7; TSH 1.858 01/03/2021: Magnesium 1.6 01/21/2021: ALT 15; BUN 32; Creatinine, Ser 1.97; Hemoglobin 12.2; Platelets 288; Potassium 4.0; Sodium 139    Lipid Panel No results found for: CHOL, TRIG, HDL, CHOLHDL, VLDL, LDLCALC, LDLDIRECT    Wt Readings from Last 3 Encounters:  02/04/21 199 lb (90.3 kg)  02/04/21 201 lb (91.2 kg)  01/21/21 197 lb (89.4 kg)      No flowsheet data found.    ASSESSMENT AND PLAN:  1.  Preop cardiovascular evaluation for cystoscopy and ureteral stent exchange, the patient is currently with no anginal symptoms and she is maintaining in sinus rhythm.  She does complain of chronic fatigue but functional capacity is greater than 4 METS.  She can proceed with surgery as planned with no need for ischemic cardiac evaluation before the surgery.  Eliquis has been on hold and should be resumed  postoperatively likely the following day as long as no contraindication from urology standpoint.  2.  Paroxysmal atrial fibrillation: She is maintaining in sinus rhythm.  Continue metoprolol and diltiazem.  CHA2DS2-VASc score is 4.  She is on anticoagulation with Eliquis but that is currently on hold for scheduled surgery tomorrow.  3.  Chronic fatigue with recently elevated troponin thought to be due to supply demand ischemia in the setting of acute renal failure and A. fib with RVR.  I will plan on risk stratification with a Lexiscan Myoview.  4.  Essential hypertension: Blood pressure is on the low side.  Given the addition of diltiazem and metoprolol during her hospitalization recently, I elected to decrease hydrochlorothiazide to 12.5 mg once daily.  5.  Recent acute renal failure due to obstructive nephropathy: Renal function improved with most recent creatinine of 1.9.  Followed by nephrology.    Disposition:   FU with me in 1 month  Signed,  Lorine Bears, MD  02/04/2021 1:28 PM    [facility]

## 2021-02-04 NOTE — Patient Instructions (Signed)
Medication Instructions:  Your physician has recommended you make the following change in your medication:   DECREASE HCTZ to 1/2 tablet (12.5mg ) daily.   *If you need a refill on your cardiac medications before your next appointment, please call your pharmacy*   Lab Work: None ordered If you have labs (blood work) drawn today and your tests are completely normal, you will receive your results only by: Marland Kitchen MyChart Message (if you have MyChart) OR . A paper copy in the mail If you have any lab test that is abnormal or we need to change your treatment, we will call you to review the results.   Testing/Procedures: None ordered   Follow-Up: At [facility], you and your health needs are our priority.  As part of our continuing mission to provide you with exceptional heart care, we have created designated Provider Care Teams.  These Care Teams include your primary Cardiologist (physician) and Advanced Practice Providers (APPs -  Physician Assistants and Nurse Practitioners) who all work together to provide you with the care you need, when you need it.  We recommend signing up for the patient portal called "MyChart".  Sign up information is provided on this After Visit Summary.  MyChart is used to connect with patients for Virtual Visits (Telemedicine).  Patients are able to view lab/test results, encounter notes, upcoming appointments, etc.  Non-urgent messages can be sent to your provider as well.   To learn more about what you can do with MyChart, go to [email or web address].    Your next appointment:   4 week(s)  The format for your next appointment:   In Person  Provider:   You may see Lorine Bears, MD or one of the following Advanced Practice Providers on your designated Care Team:    Nicolasa Ducking, NP  Eula Listen, PA-C  Marisue Ivan, PA-C  Cadence Farragut, [place]  Gillian Shields, NP    Other Instructions You are considered low risk from a cardiac standpoint  for your upcoming surgery.

## 2021-02-05 ENCOUNTER — Ambulatory Visit: Payer: Medicare Other

## 2021-02-05 ENCOUNTER — Encounter: Payer: Self-pay | Admitting: Urology

## 2021-02-05 ENCOUNTER — Ambulatory Visit
Admission: RE | Admit: 2021-02-05 | Discharge: 2021-02-05 | Disposition: A | Discharge status: Home / Self Care | Payer: Medicare Other | Attending: Urology | Admitting: Urology

## 2021-02-05 ENCOUNTER — Ambulatory Visit: Payer: Medicare Other | Admitting: Certified Registered Nurse Anesthetist

## 2021-02-05 ENCOUNTER — Ambulatory Visit: Payer: Medicare Other | Admitting: Urgent Care

## 2021-02-05 ENCOUNTER — Encounter
Admission: RE | Disposition: A | Discharge status: Home / Self Care | Payer: Self-pay | Source: Home / Self Care | Attending: Urology

## 2021-02-05 DIAGNOSIS — K219 Gastro-esophageal reflux disease without esophagitis: Secondary | ICD-10-CM | POA: Diagnosis not present

## 2021-02-05 DIAGNOSIS — E119 Type 2 diabetes mellitus without complications: Secondary | ICD-10-CM | POA: Insufficient documentation

## 2021-02-05 DIAGNOSIS — Z8249 Family history of ischemic heart disease and other diseases of the circulatory system: Secondary | ICD-10-CM | POA: Insufficient documentation

## 2021-02-05 DIAGNOSIS — N200 Calculus of kidney: Secondary | ICD-10-CM | POA: Insufficient documentation

## 2021-02-05 DIAGNOSIS — I10 Essential (primary) hypertension: Secondary | ICD-10-CM | POA: Insufficient documentation

## 2021-02-05 DIAGNOSIS — Z87442 Personal history of urinary calculi: Secondary | ICD-10-CM | POA: Diagnosis not present

## 2021-02-05 HISTORY — PX: CYSTOSCOPY/URETEROSCOPY/HOLMIUM LASER/STENT PLACEMENT: SHX6546

## 2021-02-05 LAB — GLUCOSE, CAPILLARY
Glucose-Capillary: 144 mg/dL — ABNORMAL HIGH (ref 70–99)
Glucose-Capillary: 145 mg/dL — ABNORMAL HIGH (ref 70–99)

## 2021-02-05 SURGERY — CYSTOSCOPY/URETEROSCOPY/HOLMIUM LASER/STENT PLACEMENT
Anesthesia: General | Laterality: Right

## 2021-02-05 MED ORDER — ONDANSETRON HCL 4 MG/2ML IJ SOLN
INTRAMUSCULAR | Status: DC | PRN
  Administered 2021-02-05: 4 mg via INTRAVENOUS

## 2021-02-05 MED ORDER — FENTANYL CITRATE (PF) 100 MCG/2ML IJ SOLN
INTRAMUSCULAR | Status: AC
  Filled 2021-02-05: qty 2

## 2021-02-05 MED ORDER — NITROFURANTOIN MACROCRYSTAL 100 MG PO CAPS: 100.0000 mg | ORAL_CAPSULE | Freq: Every day | ORAL | 0 refills | Status: AC

## 2021-02-05 MED ORDER — CHLORHEXIDINE GLUCONATE 0.12 % MT SOLN
OROMUCOSAL | Status: AC
  Administered 2021-02-05: 15 mL via OROMUCOSAL
  Filled 2021-02-05: qty 15

## 2021-02-05 MED ORDER — SODIUM CHLORIDE FLUSH 0.9 % IV SOLN
INTRAVENOUS | Status: AC
  Filled 2021-02-05: qty 10

## 2021-02-05 MED ORDER — DEXAMETHASONE SODIUM PHOSPHATE 10 MG/ML IJ SOLN
INTRAMUSCULAR | Status: DC | PRN
  Administered 2021-02-05: 4 mg via INTRAVENOUS

## 2021-02-05 MED ORDER — ROCURONIUM BROMIDE 100 MG/10ML IV SOLN
INTRAVENOUS | Status: DC | PRN
  Administered 2021-02-05: 40 mg via INTRAVENOUS

## 2021-02-05 MED ORDER — PROPOFOL 10 MG/ML IV BOLUS
INTRAVENOUS | Status: DC | PRN
  Administered 2021-02-05: 40 mg via INTRAVENOUS
  Administered 2021-02-05: 120 mg via INTRAVENOUS

## 2021-02-05 MED ORDER — SODIUM CHLORIDE 0.9 % IV SOLN: INTRAVENOUS | Status: DC

## 2021-02-05 MED ORDER — FENTANYL CITRATE (PF) 100 MCG/2ML IJ SOLN: 25.0000 ug | INTRAMUSCULAR | Status: DC | PRN

## 2021-02-05 MED ORDER — CEFAZOLIN SODIUM-DEXTROSE 2-4 GM/100ML-% IV SOLN
2.0000 g | INTRAVENOUS | Status: AC
  Administered 2021-02-05: 2 g via INTRAVENOUS

## 2021-02-05 MED ORDER — LIDOCAINE HCL (CARDIAC) PF 100 MG/5ML IV SOSY
PREFILLED_SYRINGE | INTRAVENOUS | Status: DC | PRN
  Administered 2021-02-05: 80 mg via INTRAVENOUS

## 2021-02-05 MED ORDER — CEFAZOLIN SODIUM-DEXTROSE 2-4 GM/100ML-% IV SOLN
INTRAVENOUS | Status: AC
  Filled 2021-02-05: qty 100

## 2021-02-05 MED ORDER — ONDANSETRON HCL 4 MG/2ML IJ SOLN
INTRAMUSCULAR | Status: AC
  Filled 2021-02-05: qty 2

## 2021-02-05 MED ORDER — ONDANSETRON HCL 4 MG/2ML IJ SOLN
4.0000 mg | Freq: Once | INTRAMUSCULAR | Status: AC | PRN
  Administered 2021-02-05: 4 mg via INTRAVENOUS

## 2021-02-05 MED ORDER — ORAL CARE MOUTH RINSE: 15.0000 mL | Freq: Once | OROMUCOSAL | Status: AC

## 2021-02-05 MED ORDER — POTASSIUM CITRATE ER 15 MEQ (1620 MG) PO TBCR
1.0000 | EXTENDED_RELEASE_TABLET | Freq: Two times a day (BID) | ORAL | 11 refills | Status: DC
Start: 2021-02-05 — End: 2022-04-26

## 2021-02-05 MED ORDER — SUGAMMADEX SODIUM 200 MG/2ML IV SOLN
INTRAVENOUS | Status: DC | PRN
  Administered 2021-02-05: 200 mg via INTRAVENOUS

## 2021-02-05 MED ORDER — FENTANYL CITRATE (PF) 100 MCG/2ML IJ SOLN
INTRAMUSCULAR | Status: DC | PRN
  Administered 2021-02-05: 50 ug via INTRAVENOUS

## 2021-02-05 MED ORDER — CHLORHEXIDINE GLUCONATE 0.12 % MT SOLN: 15.0000 mL | Freq: Once | OROMUCOSAL | Status: AC

## 2021-02-05 SURGICAL SUPPLY — 29 items
BAG DRAIN CYSTO-URO LG1000N (MISCELLANEOUS) ×2 IMPLANT
BRUSH SCRUB EZ 1% IODOPHOR (MISCELLANEOUS) ×2 IMPLANT
CATH URET FLEX-TIP 2 LUMEN 10F (CATHETERS) ×2 IMPLANT
CATH URETL 5X70 OPEN END (CATHETERS) IMPLANT
CNTNR SPEC 2.5X3XGRAD LEK (MISCELLANEOUS)
CONT SPEC 4OZ STER OR WHT (MISCELLANEOUS)
CONT SPEC 4OZ STRL OR WHT (MISCELLANEOUS)
CONTAINER SPEC 2.5X3XGRAD LEK (MISCELLANEOUS) IMPLANT
DRAPE UTILITY 15X26 TOWEL STRL (DRAPES) ×2 IMPLANT
DRSG TEGADERM 2-3/8X2-3/4 SM (GAUZE/BANDAGES/DRESSINGS) ×2 IMPLANT
GLOVE SURG UNDER POLY LF SZ7.5 (GLOVE) ×2 IMPLANT
GOWN STRL REUS W/ TWL LRG LVL3 (GOWN DISPOSABLE) ×1 IMPLANT
GOWN STRL REUS W/ TWL XL LVL3 (GOWN DISPOSABLE) ×1 IMPLANT
GOWN STRL REUS W/TWL LRG LVL3 (GOWN DISPOSABLE) ×2
GOWN STRL REUS W/TWL XL LVL3 (GOWN DISPOSABLE) ×2
GUIDEWIRE STR DUAL SENSOR (WIRE) ×4 IMPLANT
INFUSOR MANOMETER BAG 3000ML (MISCELLANEOUS) ×2 IMPLANT
IV NS IRRIG 3000ML ARTHROMATIC (IV SOLUTION) ×2 IMPLANT
KIT TURNOVER CYSTO (KITS) ×2 IMPLANT
PACK CYSTO AR (MISCELLANEOUS) ×2 IMPLANT
SET CYSTO W/LG BORE CLAMP LF (SET/KITS/TRAYS/PACK) ×2 IMPLANT
SHEATH URETERAL 12FRX35CM (MISCELLANEOUS) ×2 IMPLANT
STENT URET 6FRX24 CONTOUR (STENTS) ×2 IMPLANT
STENT URET 6FRX26 CONTOUR (STENTS) IMPLANT
SURGILUBE 2OZ TUBE FLIPTOP (MISCELLANEOUS) ×2 IMPLANT
SYR 10ML LL (SYRINGE) ×2 IMPLANT
TRACTIP FLEXIVA PULSE ID 200 (Laser) ×2 IMPLANT
VALVE UROSEAL ADJ ENDO (VALVE) ×2 IMPLANT
WATER STERILE IRR 1000ML POUR (IV SOLUTION) ×2 IMPLANT

## 2021-02-05 NOTE — Transfer of Care (Signed)
Immediate Anesthesia Transfer of Care Note  Patient: Danuta Huseman  Procedure(s) Performed: CYSTOSCOPY/URETEROSCOPY/HOLMIUM LASER/STENT EXCHANGE (Right )  Patient Location: PACU  Anesthesia Type:General  Level of Consciousness: awake, drowsy and patient cooperative  Airway & Oxygen Therapy: Patient Spontanous Breathing and Patient connected to face mask oxygen  Post-op Assessment: Report given to RN, Post -op Vital signs reviewed and stable and Patient moving all extremities  Post vital signs: Reviewed and stable  Last Vitals:  Vitals Value Taken Time  BP 121/74 02/05/21 1215  Temp    Pulse 69 02/05/21 1219  Resp 13 02/05/21 1219  SpO2 96 % 02/05/21 1219  Vitals shown include unvalidated device data.  Last Pain:  Vitals:   02/05/21 0842  TempSrc: Oral  PainSc: 0-No pain         Complications: No complications documented.

## 2021-02-05 NOTE — Anesthesia Preprocedure Evaluation (Signed)
Anesthesia Evaluation  Patient identified by MRN, date of birth, ID band Patient awake  General Assessment Comment:Patient presented with diarrhea, nausea/vomiting, admitted to coffee ground emesis, found to have AKI with creatinine 6.9. Also new onset Afib with RVR. Scheduled for a cystoscopy at 1730. Cardiology consult obtained for new onset afib/RVR, they recommended echo prior to proceeding with a surgery. Informed urologist around @1530  about this cardiology recommendation and Dr said he deemed the case an emergency and would not wait for the echo results, however he would wait until the previously scheduled time of 1730.  Reviewed: Allergy & Precautions, NPO status , Patient's Chart, lab work & pertinent test results  History of Anesthesia Complications Negative for: history of anesthetic complications  Airway Mallampati: II  TM Distance: >3 FB Neck ROM: Full    Dental no notable dental hx. (+) Teeth Intact   Pulmonary sleep apnea (does not wear CPAP because she doesn't like it) , neg COPD,    Pulmonary exam normal breath sounds clear to auscultation- rhonchi (-) wheezing      Cardiovascular hypertension, Pt. on medications (-) CAD, (-) Past MI, (-) Cardiac Stents and (-) CABG + dysrhythmias Atrial Fibrillation  Rhythm:Irregular Rate:Tachycardia - Systolic murmurs and - Diastolic murmurs Afib, HR in 100s   Neuro/Psych neg Seizures Head tremors negative neurological ROS  negative psych ROS   GI/Hepatic Neg liver ROS, GERD  ,  Endo/Other  diabetes, Poorly Controlled, Oral Hypoglycemic Agents  Renal/GU Renal disease     Musculoskeletal negative musculoskeletal ROS (+)   Abdominal (+) + obese,   Peds negative pediatric ROS (+)  Hematology  (+) REFUSES BLOOD PRODUCTS, Patient refuses all blood products and albumin even if it means her death.    Anesthesia Other Findings Past Medical History: No date: Cough      Comment:  CHRONIC No date: Diabetes mellitus without complication (HCC) No date: GERD (gastroesophageal reflux disease) No date: Hypertension No date: Sleep apnea     Comment:  no cpap No date: Tremor     Comment:  HEAD   Reproductive/Obstetrics                             Anesthesia Physical  Anesthesia Plan  ASA: III  Anesthesia Plan: General   Post-op Pain Management:    Induction: Intravenous and Rapid sequence  PONV Risk Score and Plan: 4 or greater and Ondansetron and Dexamethasone  Airway Management Planned: Oral ETT  Additional Equipment: None  Intra-op Plan:   Post-operative Plan: Extubation in OR  Informed Consent: I have reviewed the patients History and Physical, chart, labs and discussed the procedure including the risks, benefits and alternatives for the proposed anesthesia with the patient or authorized representative who has indicated his/her understanding and acceptance.     Dental advisory given  Plan Discussed with: CRNA and Surgeon  Anesthesia Plan Comments: (Discussed risks of anesthesia with patient, including PONV, sore throat, lip/dental damage. Rare risks discussed as well, such as cardiorespiratory and neurological sequelae. Patient understands. Patient counseled on being higher risk for anesthesia due to comorbidities: new onset Afib with RVR, AKI. Patient was told about increased risk of cardiac and respiratory events, including death. Patient understands. )        Anesthesia Quick Evaluation

## 2021-02-05 NOTE — Anesthesia Procedure Notes (Signed)
Procedure Name: Intubation Date/Time: 02/05/2021 11:19 AM Performed by: Lowry Bowl, CRNA Pre-anesthesia Checklist: Patient identified, Emergency Drugs available, Suction available and Patient being monitored Patient Re-evaluated:Patient Re-evaluated prior to induction Oxygen Delivery Method: Circle system utilized Preoxygenation: Pre-oxygenation with 100% oxygen Induction Type: IV induction Ventilation: Mask ventilation without difficulty Laryngoscope Size: Mac, McGraph and 3 Tube type: Oral Tube size: 7.0 mm Number of attempts: 1 Airway Equipment and Method: Stylet and Oral airway Placement Confirmation: ETT inserted through vocal cords under direct vision,  positive ETCO2 and breath sounds checked- equal and bilateral Secured at: 21 cm Tube secured with: Tape Dental Injury: Teeth and Oropharynx as per pre-operative assessment

## 2021-02-05 NOTE — H&P (Signed)
02/05/21 10:53 AM   Cynthia Lucas August 12, 1951 [id number]  CC: Right ureteral stone, UTI  HPI: Cynthia Lucas is a comorbid 70 year old female who previously presented with AKI and sepsis from urinary source and underwent emergent stent placement by Dr. Annabell Howells.  She has no showed multiple clinic visits to discuss follow-up ureteroscopy, and ultimately was scheduled for ureteroscopy, laser lithotripsy, and stent placement in the setting of her ongoing ER visits for stent pain.  She was recently cleared by cardiology in the setting of her A. fib.  She was back in sinus rhythm when seen by cardiology yesterday afternoon.   PMH: Past Medical History:  Diagnosis Date  . Atrial fibrillation (HCC)   . Cough    CHRONIC  . Diabetes mellitus without complication (HCC)   . GERD (gastroesophageal reflux disease)   . History of kidney stones   . Hypertension   . Sleep apnea    no cpap  . Tremor    HEAD    Surgical History: Past Surgical History:  Procedure Laterality Date  . ABDOMINAL HYSTERECTOMY    . CATARACT EXTRACTION W/PHACO Left 03/07/2018   Procedure: CATARACT EXTRACTION PHACO AND INTRAOCULAR LENS PLACEMENT (IOC);  Surgeon: Galen Manila, MD;  Location: [facility];  Service: Ophthalmology;  Laterality: Left;  Lot # [id number] H [place]: 04:19.8 AP%: 19.9 CDE: 51.70  . CATARACT EXTRACTION W/PHACO Right 07/07/2020   Procedure: CATARACT EXTRACTION PHACO AND INTRAOCULAR LENS PLACEMENT (IOC) RIGHT DIABETIC 7.75 00:42.8;  Surgeon: Galen Manila, MD;  Location: [facility];  Service: Ophthalmology;  Laterality: Right;  diabetic - oral meds  . CESAREAN SECTION    . COLONOSCOPY    . CYSTOSCOPY W/ RETROGRADES Right 01/07/2019   Procedure: CYSTOSCOPY WITH RETROGRADE PYELOGRAM;  Surgeon: Riki Altes, MD;  Location: [facility];  Service: Urology;  Laterality: Right;  . CYSTOSCOPY W/ URETERAL STENT PLACEMENT Right 12/28/2020   Procedure: CYSTOSCOPY WITH RETROGRADE PYELOGRAM/URETERAL STENT  PLACEMENT;  Surgeon: Bjorn Pippin, MD;  Location: [facility];  Service: Urology;  Laterality: Right;  . CYSTOSCOPY WITH STENT PLACEMENT Right 01/07/2019   Procedure: CYSTOSCOPY WITH STENT PLACEMENT;  Surgeon: Riki Altes, MD;  Location: [facility];  Service: Urology;  Laterality: Right;  . CYSTOSCOPY WITH URETEROSCOPY, STONE BASKETRY AND STENT PLACEMENT Right 01/29/2019   Procedure: CYSTOSCOPY/URETEROSCOPY/HOLMIUM LASER/STENT EXCHANGE;  Surgeon: Riki Altes, MD;  Location: [facility];  Service: Urology;  Laterality: Right;  . ESOPHAGOGASTRODUODENOSCOPY N/A 01/02/2021   Procedure: ESOPHAGOGASTRODUODENOSCOPY (EGD);  Surgeon: Toledo, Boykin Nearing, MD;  Location: [facility];  Service: Gastroenterology;  Laterality: N/A;  . JOINT REPLACEMENT       Family History: Family History  Problem Relation Age of Onset  . Heart disease Mother     Social History:  reports that she has never smoked. She has never used smokeless tobacco. She reports current alcohol use. She reports that she does not use drugs.  Physical Exam: BP 123/77   Pulse 77   Temp 98.1 F (36.7 C) (Oral)   Resp 16   Ht 5\' 7"  (1.702 m)   Wt 90.3 kg   SpO2 98%   BMI 31.17 kg/m    Constitutional:  Alert and oriented, No acute distress. Cardiovascular: Regular rate and rhythm Respiratory: Clear to auscultation bilaterally GI: Abdomen is soft, nontender, nondistended, no abdominal masses   Laboratory Data: Urine culture 4/21 with insignificant growth  Pertinent Imaging: I have personally viewed and interpreted the CT showing a large 1.5 cm stone that has been pushed back into  the lower pole with stent in appropriate position  Assessment & Plan:   70 year old female who previously presented with an infected right 1.5 cm UPJ stone and underwent emergent stent placement.  Here today for definitive management with ureteroscopy and laser lithotripsy.  We specifically discussed the risks ureteroscopy including bleeding,  infection/sepsis, stent related symptoms including flank pain/urgency/frequency/incontinence/dysuria, ureteral injury, inability to access stone, or need for staged or additional procedures.  Right ureteroscopy, laser lithotripsy, stent placement today  Legrand Rams, MD 02/05/2021  [facility] [address], [address] [place], [place] [address] [phone]

## 2021-02-05 NOTE — Op Note (Signed)
Date of procedure: 02/05/21  Preoperative diagnosis:  1. Right renal stone  Postoperative diagnosis:  1. Same  Procedure: 1. Cystoscopy, right retrograde pyelogram with intraoperative interpretation, right ureteroscopy, laser lithotripsy, stent placement  Surgeon: Legrand Rams, MD  Anesthesia: General  Complications: None  Intraoperative findings:  1.  Normal cystoscopy, distal end of stent encrusted 2.  Uncomplicated dusting of right renal stone, suspect uric acid  EBL: Minimal  Specimens: None right 6 [place] by 24  Drains: Right 6 French by 24 cm ureteral stent  Indication: Cynthia Lucas is a 70 y.o. patient with history of uric acid stones who recently presented with a 2 cm right proximal ureteral stone and sepsis and underwent urgent stent placement.  She presents today for definitive management.  After reviewing the management options for treatment, they elected to proceed with the above surgical procedure(s). We have discussed the potential benefits and risks of the procedure, side effects of the proposed treatment, the likelihood of the patient achieving the goals of the procedure, and any potential problems that might occur during the procedure or recuperation. Informed consent has been obtained.  Description of procedure:  The patient was taken to the operating room and general anesthesia was induced. SCDs were placed for DVT prophylaxis. The patient was placed in the dorsal lithotomy position, prepped and draped in the usual sterile fashion, and preoperative antibiotics(Ancef) were administered. A preoperative time-out was performed.   A 21 French rigid cystoscope was used to intubate the urethra and thorough cystoscopy was performed.  The bladder was grossly normal.  The distal end of the stent was encrusted with stone.  The stent was grasped and pulled to the meatus and a sensor wire advanced easily up to the upper pole.  The old stent was removed.  The dual-lumen  ureteral access catheter was advanced over the wire under fluoroscopic vision up into the kidney, and a second safety sensor wire was added.  A 12/14 French by 35 cm ureteral access sheath was gently advanced over the wire under fluoroscopic vision to the UPJ.  The single channel digital ureteroscope was advanced through the sheath, and a 2 cm yellow uric acid appearing stone was identified in the lower pole.  A 242 m laser fiber on settings of 0.4 J and 60 Hz was used to methodically dust the stone to <1 mm fragments.  Once the majority the stone had been dusted, I changed to popcorn settings with short pulse and all fragments were pulverized.  Thorough pyeloscopy showed no other stones.  Careful pullback ureteroscopy showed no lesions or ureteral injury.  The 6 [place] by 24 cm ureteral stent was uneventfully placed over the wire under fluoroscopic vision with an excellent curl in the renal pelvis, as well as in the bladder.  The bladder was drained.  Disposition: Stable to PACU  Plan: Follow-up for stent removal in 1 to 2 weeks Nitrofurantoin prophylaxis with stent in place Start potassium citrate 15 mEq twice daily  Legrand Rams, MD

## 2021-02-05 NOTE — Discharge Instructions (Signed)

## 2021-02-06 NOTE — Anesthesia Postprocedure Evaluation (Signed)
Anesthesia Post Note  [name]  Procedure(s) Performed: CYSTOSCOPY/URETEROSCOPY/HOLMIUM LASER/STENT EXCHANGE (Right )  Patient location during evaluation: PACU Anesthesia Type: General Level of consciousness: awake and alert and oriented Pain management: pain level controlled Vital Signs Assessment: post-procedure vital signs reviewed and stable Respiratory status: spontaneous breathing Cardiovascular status: blood pressure returned to baseline Anesthetic complications: no   No complications documented.   Last Vitals:  Vitals:   02/05/21 1230 02/05/21 1253  BP: 120/66 (!) 139/59  Pulse: 65 63  Resp: 11 14  Temp: (!) 36.3 C (!) 36.2 C  SpO2: 97% 99%    Last Pain:  Vitals:   02/05/21 1253  TempSrc: Temporal  PainSc: 0-No pain                 Honor Frison

## 2021-02-07 ENCOUNTER — Encounter: Payer: Self-pay | Admitting: Urology

## 2021-02-17 ENCOUNTER — Other Ambulatory Visit: Payer: Self-pay

## 2021-02-17 ENCOUNTER — Encounter: Payer: Self-pay | Admitting: Urology

## 2021-02-17 ENCOUNTER — Ambulatory Visit (INDEPENDENT_AMBULATORY_CARE_PROVIDER_SITE_OTHER): Payer: Medicare Other | Admitting: Urology

## 2021-02-17 VITALS — BP 97/69 | HR 76 | Ht 67.0 in | Wt 200.0 lb

## 2021-02-17 DIAGNOSIS — N200 Calculus of kidney: Secondary | ICD-10-CM | POA: Diagnosis not present

## 2021-02-17 DIAGNOSIS — N209 Urinary calculus, unspecified: Secondary | ICD-10-CM

## 2021-02-17 NOTE — Patient Instructions (Signed)
You form uric acid stones.  These are caused by high meat intake, decrease the amount of meat you eat in the diet including red meat, chicken, and fish.  Drink lemon juice or lemonade or Crystal light lemonade to help prevent stones.  We also started you on potassiums citrate twice a day which will help prevent kidney stones.  Drink plenty of fluids.     Dietary Guidelines to Help Prevent Kidney Stones Kidney stones are deposits of minerals and salts that form inside your kidneys. Your risk of developing kidney stones may be greater depending on your diet, your lifestyle, the medicines you take, and whether you have certain medical conditions. Most people can lower their chances of developing kidney stones by following the instructions below. Your dietitian may give you more specific instructions depending on your overall health and the type of kidney stones you tend to develop. What are tips for following this plan? Reading food labels  Choose foods with "no salt added" or "low-salt" labels. Limit your salt (sodium) intake to less than 1,500 mg a day.  Choose foods with calcium for each meal and snack. Try to eat about 300 mg of calcium at each meal. Foods that contain 200-500 mg of calcium a serving include: ? 8 oz (237 mL) of milk, calcium-fortifiednon-dairy milk, and calcium-fortifiedfruit juice. Calcium-fortified means that calcium has been added to these drinks. ? 8 oz (237 mL) of kefir, yogurt, and soy yogurt. ? 4 oz (114 g) of tofu. ? 1 oz (28 g) of cheese. ? 1 cup (150 g) of dried figs. ? 1 cup (91 g) of cooked broccoli. ? One 3 oz (85 g) can of sardines or mackerel. Most people need 1,000-1,500 mg of calcium a day. Talk to your dietitian about how much calcium is recommended for you.   Shopping  Buy plenty of fresh fruits and vegetables. Most people do not need to avoid fruits and vegetables, even if these foods contain nutrients that may contribute to kidney stones.  When shopping  for convenience foods, choose: ? Whole pieces of fruit. ? Pre-made salads with dressing on the side. ? Low-fat fruit and yogurt smoothies.  Avoid buying frozen meals or prepared deli foods. These can be high in sodium.  Look for foods with live cultures, such as yogurt and kefir.  Choose high-fiber grains, such as whole-wheat breads, oat bran, and wheat cereals. Cooking  Do not add salt to food when cooking. Place a salt shaker on the table and allow each person to add his or her own salt to taste.  Use vegetable protein, such as beans, textured vegetable protein (TVP), or tofu, instead of meat in pasta, casseroles, and soups. Meal planning  Eat less salt, if told by your dietitian. To do this: ? Avoid eating processed or pre-made food. ? Avoid eating fast food.  Eat less animal protein, including cheese, meat, poultry, or fish, if told by your dietitian. To do this: ? Limit the number of times you have meat, poultry, fish, or cheese each week. Eat a diet free of meat at least 2 days a week. ? Eat only one serving each day of meat, poultry, fish, or seafood. ? When you prepare animal protein, cut pieces into small portion sizes. For most meat and fish, one serving is about the size of the palm of your hand.  Eat at least five servings of fresh fruits and vegetables each day. To do this: ? Keep fruits and vegetables on hand for  snacks. ? Eat one piece of fruit or a handful of berries with breakfast. ? Have a salad and fruit at lunch. ? Have two kinds of vegetables at dinner.  Limit foods that are high in a substance called oxalate. These include: ? Spinach (cooked), rhubarb, beets, sweet potatoes, and Swiss chard. ? Peanuts. ? Potato chips, french fries, and baked potatoes with skin on. ? Nuts and nut products. ? Chocolate.  If you regularly take a diuretic medicine, make sure to eat at least 1 or 2 servings of fruits or vegetables that are high in potassium each day. These  include: ? Avocado. ? Banana. ? Orange, prune, carrot, or tomato juice. ? Baked potato. ? Cabbage. ? Beans and split peas. Lifestyle  Drink enough fluid to keep your urine pale yellow. This is the most important thing you can do. Spread your fluid intake throughout the day.  If you drink alcohol: ? Limit how much you use to:  0-1 drink a day for women who are not pregnant.  0-2 drinks a day for men. ? Be aware of how much alcohol is in your drink. In the [place] one drink equals one 12 oz bottle of beer (355 mL), one 5 oz glass of wine (148 mL), or one 1 oz glass of hard liquor (44 mL).  Lose weight if told by your health care provider. Work with your dietitian to find an eating plan and weight loss strategies that work best for you.   General information  Talk to your health care provider and dietitian about taking daily supplements. You may be told the following depending on your health and the cause of your kidney stones: ? Not to take supplements with vitamin C. ? To take a calcium supplement. ? To take a daily probiotic supplement. ? To take other supplements such as magnesium, fish oil, or vitamin B6.  Take over-the-counter and prescription medicines only as told by your health care provider. These include supplements. What foods should I limit? Limit your intake of the following foods, or eat them as told by your dietitian. Vegetables Spinach. Rhubarb. Beets. Canned vegetables. Rosita Fire. Olives. Baked potatoes with skin. Grains Wheat bran. Baked goods. Salted crackers. Cereals high in sugar. Meats and other proteins Nuts. Nut butters. Large portions of meat, poultry, or fish. Salted, precooked, or cured meats, such as sausages, meat loaves, and hot dogs. Dairy Cheese. Beverages Regular soft drinks. Regular vegetable juice. Seasonings and condiments Seasoning blends with salt. Salad dressings. Soy sauce. Ketchup. Barbecue sauce. Other foods Canned soups. Canned pasta  sauce. Casseroles. Pizza. Lasagna. Frozen meals. Potato chips. [place] fries. The items listed above may not be a complete list of foods and beverages you should limit. Contact a dietitian for more information. What foods should I avoid? Talk to your dietitian about specific foods you should avoid based on the type of kidney stones you have and your overall health. Fruits Grapefruit. The item listed above may not be a complete list of foods and beverages you should avoid. Contact a dietitian for more information. Summary  Kidney stones are deposits of minerals and salts that form inside your kidneys.  You can lower your risk of kidney stones by making changes to your diet.  The most important thing you can do is drink enough fluid. Drink enough fluid to keep your urine pale yellow.  Talk to your dietitian about how much calcium you should have each day, and eat less salt and animal protein as told by  your dietitian. This information is not intended to replace advice given to you by your health care provider. Make sure you discuss any questions you have with your health care provider. Document Revised: 09/12/2019 Document Reviewed: 09/12/2019 Elsevier Patient Education  2021 [employer].

## 2021-02-17 NOTE — Progress Notes (Signed)
Cystoscopy Procedure Note:  Indication: Stent removal s/p 02/05/2021 right ureteroscopy/laser lithotripsy/stent placement for a 2 cm proximal ureteral stone  After informed consent and discussion of the procedure and its risks, Cynthia Lucas was positioned and prepped in the standard fashion. Cystoscopy was performed with a flexible cystoscope. The stent was grasped with flexible graspers and removed in its entirety. The patient tolerated the procedure well.  Findings: Uncomplicated stent removal  Assessment and Plan: Potassium citrate 15 mEq twice daily for recurrent uric acid stones RTC 3 months UA for pH check  We discussed general stone prevention strategies including adequate hydration with goal of producing 2.5 L of urine daily, increasing citric acid intake, increasing calcium intake during high oxalate meals, minimizing animal protein, and decreasing salt intake. Information about dietary recommendations given today.    Sondra Come, MD 02/17/2021

## 2021-02-19 LAB — MICROSCOPIC EXAMINATION: RBC, Urine: >30 /hpf — ABNORMAL HIGH (ref 0–2)

## 2021-02-19 LAB — URINALYSIS, COMPLETE
Bilirubin, UA: NEGATIVE
Nitrite, UA: NEGATIVE
Specific Gravity, UA: 1.02 (ref 1.005–1.030)
Urobilinogen, Ur: 0.2 mg/dL (ref 0.2–1.0)
pH, UA: 5.5 (ref 5.0–7.5)

## 2021-03-05 NOTE — Progress Notes (Signed)
Cardiology Office Note    Date:  03/09/2021   ID:  Cynthia Lucas, DOB 05/29/1951, MRN [id number]  PCP:  Toy Cookey, FNP  Cardiologist:  Lorine Bears, MD  Electrophysiologist:  None   Chief Complaint: Follow-up  History of Present Illness:   Cynthia Lucas is a 70 y.o. female with history of PAF, DM2, HTN, sleep apnea not on CPAP secondary to intolerance, essential tremor, and GERD who presents for follow-up of fatigue.  She was admitted in 12/2020 with left flank pain and coffee-ground emesis.  She was found to be in A. fib with RVR upon presentation, which was a new diagnosis.  In addition, she was noted to have ARF with a creatinine of 6.5 as well as a gradual drop in her hemoglobin to 11.5.  She was rate controlled with diltiazem.  High-sensitivity troponin was mildly elevated and felt to be related to supply demand ischemia.  Echo showed a normal LV systolic function with an EF of 55 to 60%, no regional wall motion abnormalities, mild LVH, normal RV systolic function and ventricular cavity size, normal PASP, mild to moderate mitral regurgitation, moderately dilated left atrium, and a mildly dilated ascending aorta.  She converted to sinus rhythm during her hospital admission.  EGD, to further evaluate her drop in hemoglobin, showed no significant abnormalities.  She was started on Eliquis prior to hospital discharge.  Her acute renal failure was felt to be due to obstructive nephropathy with subsequent improvement following ureteral stent placement.  She was last seen in the office on 02/04/2021 for preoperative cardiac risk stratification and was doing well from a cardiac perspective.  She was maintaining sinus rhythm.  She did note chronic fatigue though her functional capacity was greater than 4 METs.  No further cardiac evaluation was needed prior to ureteral stent exchange.  It was recommended Eliquis to be resumed post procedurally with minimization of interruption to reduce CVA risk.   With regards to her chronic fatigue and history of elevated troponin Lexiscan MPI has been recommended.  She comes in today feeling about the same as she did at her last visit.  She continues to note constant fatigue.  No exertional chest pain or shortness of breath.  No palpitations, dizziness, presyncope, or syncope.  No lower extremity swelling.  No falls.  She did note an episode of mild hematochezia with loose stool approximately 4 days ago.  No melena.  She notes subsequent bowel movements have not had BRBPR.  She has not missed any doses of apixaban and is taking this twice daily.  No falls since she was last seen.  She does report a long history of sleep apnea and has a CPAP though is intolerant to this.  She is not open to revisiting further evaluation of her sleep apnea despite her chronic fatigue.   Labs independently reviewed: 01/2021 - albumin 4.1, AST/ALT normal, HGB 12.2, PLT 288, potassium 4.0, BUN 32, SCr 1.97, magnesium 1.6 12/2020 - A1c 7.9, TSH normal  Past Medical History:  Diagnosis Date  . Atrial fibrillation (HCC)   . Cough    CHRONIC  . Diabetes mellitus without complication (HCC)   . GERD (gastroesophageal reflux disease)   . History of kidney stones   . Hypertension   . Sleep apnea    no cpap  . Tremor    HEAD    Past Surgical History:  Procedure Laterality Date  . ABDOMINAL HYSTERECTOMY    . CATARACT EXTRACTION W/PHACO Left 03/07/2018  Procedure: CATARACT EXTRACTION PHACO AND INTRAOCULAR LENS PLACEMENT (IOC);  Surgeon: Galen Manila, MD;  Location: [facility];  Service: Ophthalmology;  Laterality: Left;  Lot # [id number] H [place]: 04:19.8 AP%: 19.9 CDE: 51.70  . CATARACT EXTRACTION W/PHACO Right 07/07/2020   Procedure: CATARACT EXTRACTION PHACO AND INTRAOCULAR LENS PLACEMENT (IOC) RIGHT DIABETIC 7.75 00:42.8;  Surgeon: Galen Manila, MD;  Location: [facility];  Service: Ophthalmology;  Laterality: Right;  diabetic - oral meds  . CESAREAN SECTION    .  COLONOSCOPY    . CYSTOSCOPY W/ RETROGRADES Right 01/07/2019   Procedure: CYSTOSCOPY WITH RETROGRADE PYELOGRAM;  Surgeon: Riki Altes, MD;  Location: [facility];  Service: Urology;  Laterality: Right;  . CYSTOSCOPY W/ URETERAL STENT PLACEMENT Right 12/28/2020   Procedure: CYSTOSCOPY WITH RETROGRADE PYELOGRAM/URETERAL STENT PLACEMENT;  Surgeon: Bjorn Pippin, MD;  Location: [facility];  Service: Urology;  Laterality: Right;  . CYSTOSCOPY WITH STENT PLACEMENT Right 01/07/2019   Procedure: CYSTOSCOPY WITH STENT PLACEMENT;  Surgeon: Riki Altes, MD;  Location: [facility];  Service: Urology;  Laterality: Right;  . CYSTOSCOPY WITH URETEROSCOPY, STONE BASKETRY AND STENT PLACEMENT Right 01/29/2019   Procedure: CYSTOSCOPY/URETEROSCOPY/HOLMIUM LASER/STENT EXCHANGE;  Surgeon: Riki Altes, MD;  Location: [facility];  Service: Urology;  Laterality: Right;  . CYSTOSCOPY/URETEROSCOPY/HOLMIUM LASER/STENT PLACEMENT Right 02/05/2021   Procedure: CYSTOSCOPY/URETEROSCOPY/HOLMIUM LASER/STENT EXCHANGE;  Surgeon: Sondra Come, MD;  Location: [facility];  Service: Urology;  Laterality: Right;  . ESOPHAGOGASTRODUODENOSCOPY N/A 01/02/2021   Procedure: ESOPHAGOGASTRODUODENOSCOPY (EGD);  Surgeon: Toledo, Boykin Nearing, MD;  Location: [facility];  Service: Gastroenterology;  Laterality: N/A;  . JOINT REPLACEMENT      Current Medications: Current Meds  Medication Sig  . ACCU-CHEK AVIVA PLUS test strip   . brimonidine-timolol (COMBIGAN) 0.2-0.5 % ophthalmic solution Place 1 drop into both eyes every 12 (twelve) hours.  . hydrochlorothiazide (HYDRODIURIL) 25 MG tablet Take 0.5 tablets (12.5 mg total) by mouth daily.  . iron polysaccharides (NIFEREX) 150 MG capsule Take 1 capsule (150 mg total) by mouth daily. Can take any over-the-counter.  Marland Kitchen LUMIGAN 0.01 % SOLN Place 1 drop into the left eye at bedtime.   . Magnesium 400 MG TABS Take 1 tablet by mouth daily.  . metFORMIN (GLUCOPHAGE) 500 MG tablet Take 2 tablets (1,000 mg  total) by mouth 2 (two) times daily with a meal.  . pantoprazole (PROTONIX) 40 MG tablet Take 1 tablet (40 mg total) by mouth 2 (two) times daily.  . Potassium Citrate 15 MEQ (1620 MG) TBCR Take 1 tablet by mouth in the morning and at bedtime. To prevent recurrent uric acid kidney stones  . tamsulosin (FLOMAX) 0.4 MG CAPS capsule Take 0.4 mg by mouth.  . [DISCONTINUED] apixaban (ELIQUIS) 5 MG TABS tablet Take 1 tablet (5 mg total) by mouth 2 (two) times daily. Blood thinner.  . [DISCONTINUED] atorvastatin (LIPITOR) 40 MG tablet Take 40 mg by mouth at bedtime.   . [DISCONTINUED] diltiazem (CARDIZEM CD) 180 MG 24 hr capsule Take 1 capsule (180 mg total) by mouth daily. To control your heart rate.  . [DISCONTINUED] metoprolol tartrate (LOPRESSOR) 50 MG tablet Take 1 tablet (50 mg total) by mouth 2 (two) times daily. To control your heart rate.    Allergies:   Other   Social History   Socioeconomic History  . Marital status: Divorced    Spouse name: Not on file  . Number of children: Not on file  . Years of education: Not on file  . Highest education level:  Not on file  Occupational History  . Not on file  Tobacco Use  . Smoking status: Never Smoker  . Smokeless tobacco: Never Used  Vaping Use  . Vaping Use: Never used  Substance and Sexual Activity  . Alcohol use: Yes    Comment: OCCAS  . Drug use: Never  . Sexual activity: Yes    Birth control/protection: None  Other Topics Concern  . Not on file  Social History Narrative  . Not on file   Social Determinants of Health   Financial Resource Strain: Not on file  Food Insecurity: Not on file  Transportation Needs: Not on file  Physical Activity: Not on file  Stress: Not on file  Social Connections: Not on file     Family History:  The patient's family history includes Heart disease in her mother.  ROS:   Review of Systems  Constitutional: Positive for malaise/fatigue. Negative for chills, diaphoresis, fever and weight  loss.  HENT: Negative for congestion.   Eyes: Negative for discharge and redness.  Respiratory: Negative for cough, sputum production, shortness of breath and wheezing.   Cardiovascular: Negative for chest pain, palpitations, orthopnea, claudication, leg swelling and PND.  Gastrointestinal: Positive for blood in stool and diarrhea. Negative for abdominal pain, constipation, heartburn, melena, nausea and vomiting.  Musculoskeletal: Negative for falls and myalgias.  Skin: Negative for rash.  Neurological: Positive for tremors and weakness. Negative for dizziness, tingling, sensory change, speech change, focal weakness and loss of consciousness.  Endo/Heme/Allergies: Does not bruise/bleed easily.  Psychiatric/Behavioral: Negative for substance abuse. The patient is not nervous/anxious.   All other systems reviewed and are negative.    EKGs/Labs/Other Studies Reviewed:    Studies reviewed were summarized above. The additional studies were reviewed today:  2D echo 12/29/2020: 1. Left ventricular ejection fraction, by estimation, is 55 to 60%. The  left ventricle has normal function. The left ventricle has no regional  wall motion abnormalities. There is mild left ventricular hypertrophy.  Left ventricular diastolic function  could not be evaluated.  2. Right ventricular systolic function is normal. The right ventricular  size is normal. There is normal pulmonary artery systolic pressure.  3. Left atrial size was moderately dilated.  4. The mitral valve is normal in structure. Mild to moderate mitral valve  regurgitation. No evidence of mitral stenosis.  5. The aortic valve is normal in structure. Aortic valve regurgitation is  mild. Mild aortic valve sclerosis is present, with no evidence of aortic  valve stenosis.  6. Aortic dilatation noted. There is mild dilatation of the ascending  aorta, measuring 41 mm.  7. Only parasternal views.    EKG:  EKG is ordered today.  The EKG  ordered today demonstrates NSR, 64 bpm,, nonspecific ST-T changes  Recent Labs: 12/27/2020: B Natriuretic Peptide 530.7; TSH 1.858 01/03/2021: Magnesium 1.6 01/21/2021: ALT 15; BUN 32; Creatinine, Ser 1.97; Hemoglobin 12.2; Platelets 288; Potassium 4.0; Sodium 139  Recent Lipid Panel No results found for: CHOL, TRIG, HDL, CHOLHDL, VLDL, LDLCALC, LDLDIRECT  PHYSICAL EXAM:    VS:  BP 112/74   Pulse 64   Ht 5\' 7"  (1.702 m)   Wt 195 lb (88.5 kg)   BMI 30.54 kg/m   BMI: Body mass index is 30.54 kg/m.  Physical Exam Vitals reviewed.  Constitutional:      Appearance: She is well-developed.  HENT:     Head: Normocephalic and atraumatic.  Eyes:     General:  Right eye: No discharge.        Left eye: No discharge.  Neck:     Vascular: No JVD.  Cardiovascular:     Rate and Rhythm: Normal rate and regular rhythm.     Pulses: No midsystolic click and no opening snap.          Posterior tibial pulses are 2+ on the right side and 2+ on the left side.     Heart sounds: Normal heart sounds, S1 normal and S2 normal. Heart sounds not distant. No murmur heard. No friction rub.  Pulmonary:     Effort: Pulmonary effort is normal. No respiratory distress.     Breath sounds: Normal breath sounds. No decreased breath sounds, wheezing or rales.  Chest:     Chest wall: No tenderness.  Abdominal:     General: There is no distension.     Palpations: Abdomen is soft.     Tenderness: There is no abdominal tenderness.  Musculoskeletal:     Cervical back: Normal range of motion.  Skin:    General: Skin is warm and dry.     Nails: There is no clubbing.  Neurological:     Mental Status: She is alert and oriented to person, place, and time.     Motor: Tremor present.  Psychiatric:        Speech: Speech normal.        Behavior: Behavior normal.        Thought Content: Thought content normal.        Judgment: Judgment normal.     Wt Readings from Last 3 Encounters:  03/09/21 195 lb (88.5  kg)  02/17/21 200 lb (90.7 kg)  02/05/21 199 lb (90.3 kg)     ASSESSMENT & PLAN:   1. PAF: Maintaining sinus rhythm on Lopressor and Cardizem CD.  CHA2DS2-VASc at least 4 (HTN, age x1, DM, sex category).  She remains on apixaban with improved and low normal hemoglobin with an episode of possible hematochezia noted 4 days ago.  Check CBC.  2. Chronic fatigue with history of elevated troponin: Her elevated troponin has been felt to be related to supply demand ischemia in the context of ARF and A. fib with RVR.  Schedule Lexiscan MPI to evaluate for high risk ischemia given chronic fatigue and elevated troponin.  Suspect a significant component of her chronic fatigue is underlying untreated sleep apnea with the patient declination for referral to sleep medicine at this time.  Risks of untreated sleep apnea were discussed in detail.  If stress testing is unrevealing no further cardiac testing is recommended at this time outside of continued recommendation for sleep medicine evaluation.  Chronic fatigue is likely also noted in the setting of obesity and physical deconditioning.  3. HTN: Blood pressure is well controlled in the office.  She remains on Cardizem CD, HCTZ, and Lopressor.  Low-sodium diet recommended.  4. 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  Followed by nephrology.  5. Anemia: Most recent Hgb improved and now low normal.  She did note an episode of hematochezia with associated loose stools approximately 4 days ago.  6. Hematochezia: Check CBC.  Follow-up with PCP/GI.  7. Tremor: Follow-up with PCP as directed.  8. Sleep apnea: Has CPAP but was intolerant.  Declines further evaluation at this time.  Untreated sleep apnea is likely a significant contributing factor to her ongoing fatigue.  Continue to revisit in follow-up.  Disposition: F/u with Dr. Kirke Corin or an APP in 1 month.   Medication Adjustments/Labs and  Tests Ordered: Current medicines are reviewed at length with the patient today.  Concerns regarding medicines are outlined above. Medication changes, Labs and Tests ordered today are summarized above and listed in the Patient Instructions accessible in Encounters.   Signed, Eula Listen, PA-C 03/09/2021 12:38 PM     [facility] [place] [address] [place], [place] [address] [phone]

## 2021-03-09 ENCOUNTER — Ambulatory Visit (INDEPENDENT_AMBULATORY_CARE_PROVIDER_SITE_OTHER): Payer: Medicare Other | Admitting: Physician Assistant

## 2021-03-09 ENCOUNTER — Encounter: Payer: Self-pay | Admitting: Physician Assistant

## 2021-03-09 VITALS — BP 112/74 | HR 64 | Ht 67.0 in | Wt 195.0 lb

## 2021-03-09 DIAGNOSIS — I480 Paroxysmal atrial fibrillation: Secondary | ICD-10-CM | POA: Diagnosis not present

## 2021-03-09 DIAGNOSIS — R5382 Chronic fatigue, unspecified: Secondary | ICD-10-CM | POA: Diagnosis not present

## 2021-03-09 DIAGNOSIS — I10 Essential (primary) hypertension: Secondary | ICD-10-CM | POA: Diagnosis not present

## 2021-03-09 DIAGNOSIS — K921 Melena: Secondary | ICD-10-CM

## 2021-03-09 DIAGNOSIS — N139 Obstructive and reflux uropathy, unspecified: Secondary | ICD-10-CM

## 2021-03-09 DIAGNOSIS — N179 Acute kidney failure, unspecified: Secondary | ICD-10-CM

## 2021-03-09 DIAGNOSIS — R778 Other specified abnormalities of plasma proteins: Secondary | ICD-10-CM

## 2021-03-09 DIAGNOSIS — D649 Anemia, unspecified: Secondary | ICD-10-CM

## 2021-03-09 DIAGNOSIS — R251 Tremor, unspecified: Secondary | ICD-10-CM

## 2021-03-09 DIAGNOSIS — G4730 Sleep apnea, unspecified: Secondary | ICD-10-CM

## 2021-03-09 MED ORDER — ATORVASTATIN CALCIUM 40 MG PO TABS: 40.0000 mg | ORAL_TABLET | Freq: Every day | ORAL | 1 refills | Status: DC

## 2021-03-09 MED ORDER — METOPROLOL TARTRATE 50 MG PO TABS: 50.0000 mg | ORAL_TABLET | Freq: Two times a day (BID) | ORAL | 1 refills | Status: DC

## 2021-03-09 MED ORDER — APIXABAN 5 MG PO TABS: 5.0000 mg | ORAL_TABLET | Freq: Two times a day (BID) | ORAL | 1 refills | Status: DC

## 2021-03-09 MED ORDER — DILTIAZEM HCL ER COATED BEADS 180 MG PO CP24: 180.0000 mg | ORAL_CAPSULE | Freq: Every day | ORAL | 1 refills | Status: DC

## 2021-03-09 NOTE — Patient Instructions (Addendum)
Medication Instructions:  - Your physician recommends that you continue on your current medications as directed. Please refer to the Current Medication list given to you today.  *If you need a refill on your cardiac medications before your next appointment, please call your pharmacy*   Lab Work: - Your physician recommends that you have lab work today: CBC   If you have labs (blood work) drawn today and your tests are completely normal, you will receive your results only by: Marland Kitchen MyChart Message (if you have MyChart) OR . A paper copy in the mail If you have any lab test that is abnormal or we need to change your treatment, we will call you to review the results.   Testing/Procedures:  1) Lexiscan Myoview (Chemical Stress Test/ Cardiac Nuclear Scan):  - Your physician has requested that you have a lexiscan myoview.   [facility] MYOVIEW  Your caregiver has ordered a Stress Test with nuclear imaging. The purpose of this test is to evaluate the blood supply to your heart muscle. This procedure is referred to as a "Non-Invasive Stress Test." This is because other than having an IV started in your vein, nothing is inserted or "invades" your body. Cardiac stress tests are done to find areas of poor blood flow to the heart by determining the extent of coronary artery disease (CAD). Some patients exercise on a treadmill, which naturally increases the blood flow to your heart, while others who are  unable to walk on a treadmill due to physical limitations have a pharmacologic/chemical stress agent called Lexiscan . This medicine will mimic walking on a treadmill by temporarily increasing your coronary blood flow.   Please note: these test may take anywhere between 2-4 hours to complete  PLEASE REPORT TO [facility] ENTRANCE  THE VOLUNTEERS AT THE FIRST DESK WILL DIRECT YOU WHERE TO GO  Date of Procedure:_____________________________________  Arrival Time for  Procedure:______________________________  Instructions regarding medication:   __x__ : Hold diabetes medication (METFORMIN) the morning of procedure  __x__:  Hold betablocker(s) (METOPROLOL) night before procedure and morning of procedure  __x__:  Hold HCTZ the morning of your test  PLEASE NOTIFY THE OFFICE AT LEAST 24 HOURS IN ADVANCE IF YOU ARE UNABLE TO KEEP YOUR APPOINTMENT.  [phone] AND  PLEASE NOTIFY NUCLEAR MEDICINE AT [facility] AT LEAST 24 HOURS IN ADVANCE IF YOU ARE UNABLE TO KEEP YOUR APPOINTMENT. [phone]  How to prepare for your Myoview test:  1. Do not eat or drink after midnight 2. No caffeine for 24 hours prior to test 3. No smoking 24 hours prior to test. 4. Your medication may be taken with water.  If your doctor stopped a medication because of this test, do not take that medication. 5. Ladies, please do not wear dresses.  Skirts or pants are appropriate. Please wear a short sleeve shirt. 6. No perfume, cologne or lotion. 7. Wear comfortable walking shoes. No heels!   Follow-Up: At [facility] you and your health needs are our priority.  As part of our continuing mission to provide you with exceptional heart care, we have created designated Provider Care Teams.  These Care Teams include your primary Cardiologist (physician) and Advanced Practice Providers (APPs -  Physician Assistants and Nurse Practitioners) who all work together to provide you with the care you need, when you need it.  We recommend signing up for the patient portal called "MyChart".  Sign up information is provided on this After Visit Summary.  MyChart is used to  connect with patients for Virtual Visits (Telemedicine).  Patients are able to view lab/test results, encounter notes, upcoming appointments, etc.  Non-urgent messages can be sent to your provider as well.   To learn more about what you can do with MyChart, go to [email or web address].    Your next appointment:   1 month(s)  The  format for your next appointment:   In Person  Provider:   You may see Lorine Bears, MD or one of the following Advanced Practice Providers on your designated Care Team:    Nicolasa Ducking, NP  Eula Listen, PA-C  Marisue Ivan, PA-C  Cadence Fransico Michael, [place]  Gillian Shields, NP    Other Instructions   Cardiac Nuclear Scan A cardiac nuclear scan is a test that is done to check the flow of blood to your heart. It is done when you are resting and when you are exercising. The test looks for problems such as:  Not enough blood reaching a portion of the heart.  The heart muscle not working as it should. You may need this test if:  You have heart disease.  You have had lab results that are not normal.  You have had heart surgery or a balloon procedure to open up blocked arteries (angioplasty).  You have chest pain.  You have shortness of breath. In this test, a special dye (tracer) is put into your bloodstream. The tracer will travel to your heart. A camera will then take pictures of your heart to see how the tracer moves through your heart. This test is usually done at a hospital and takes 2-4 hours. Tell a doctor about:  Any allergies you have.  All medicines you are taking, including vitamins, herbs, eye drops, creams, and over-the-counter medicines.  Any problems you or family members have had with anesthetic medicines.  Any blood disorders you have.  Any surgeries you have had.  Any medical conditions you have.  Whether you are pregnant or may be pregnant. What are the risks? Generally, this is a safe test. However, problems may occur, such as:  Serious chest pain and heart attack. This is only a risk if the stress portion of the test is done.  Rapid heartbeat.  A feeling of warmth in your chest. This feeling usually does not last long.  Allergic reaction to the tracer. What happens before the test?  Ask your doctor about changing or stopping your  normal medicines. This is important.  Follow instructions from your doctor about what you cannot eat or drink.  Remove your jewelry on the day of the test. What happens during the test?  An IV tube will be inserted into one of your veins.  Your doctor will give you a small amount of tracer through the IV tube.  You will wait for 20-40 minutes while the tracer moves through your bloodstream.  Your heart will be monitored with an electrocardiogram (ECG).  You will lie down on an exam table.  Pictures of your heart will be taken for about 15-20 minutes.  You may also have a stress test. For this test, one of these things may be done: ? You will be asked to exercise on a treadmill or a stationary bike. ? You will be given medicines that will make your heart work harder. This is done if you are unable to exercise.  When blood flow to your heart has peaked, a tracer will again be given through the IV tube.  After 20-40 minutes, you will  get back on the exam table. More pictures will be taken of your heart.  Depending on the tracer that is used, more pictures may need to be taken 3-4 hours later.  Your IV tube will be removed when the test is over. The test may vary among doctors and hospitals. What happens after the test?  Ask your doctor: ? Whether you can return to your normal schedule, including diet, activities, and medicines. ? Whether you should drink more fluids. This will help to remove the tracer from your body. Drink enough fluid to keep your pee (urine) pale yellow.  Ask your doctor, or the department that is doing the test: ? When will my results be ready? ? How will I get my results? Summary  A cardiac nuclear scan is a test that is done to check the flow of blood to your heart.  Tell your doctor whether you are pregnant or may be pregnant.  Before the test, ask your doctor about changing or stopping your normal medicines. This is important.  Ask your doctor  whether you can return to your normal activities. You may be asked to drink more fluids. This information is not intended to replace advice given to you by your health care provider. Make sure you discuss any questions you have with your health care provider. Document Revised: 01/09/2019 Document Reviewed: 03/05/2018 Elsevier Patient Education  2021 [employer].

## 2021-03-10 ENCOUNTER — Telehealth: Payer: Self-pay | Admitting: *Deleted

## 2021-03-10 LAB — CBC WITH DIFFERENTIAL/PLATELET
Basophils Absolute: 0 10*3/uL (ref 0.0–0.2)
Basos: 0 %
EOS (ABSOLUTE): 0.1 10*3/uL (ref 0.0–0.4)
Eos: 2 %
Hematocrit: 34.6 % (ref 34.0–46.6)
Hemoglobin: 11.7 g/dL (ref 11.1–15.9)
Immature Grans (Abs): 0 10*3/uL (ref 0.0–0.1)
Immature Granulocytes: 0 %
Lymphocytes Absolute: 2.5 10*3/uL (ref 0.7–3.1)
Lymphs: 32 %
MCH: 26.7 pg (ref 26.6–33.0)
MCHC: 33.8 g/dL (ref 31.5–35.7)
MCV: 79 fL (ref 79–97)
Monocytes Absolute: 0.4 10*3/uL (ref 0.1–0.9)
Monocytes: 5 %
Neutrophils Absolute: 4.9 10*3/uL (ref 1.4–7.0)
Neutrophils: 61 %
Platelets: 308 10*3/uL (ref 150–450)
RBC: 4.39 x10E6/uL (ref 3.77–5.28)
RDW: 15 % (ref 11.7–15.4)
WBC: 7.9 10*3/uL (ref 3.4–10.8)

## 2021-03-10 NOTE — Telephone Encounter (Signed)
-----   Message from Sondra Barges, PA-C sent at 03/10/2021  7:29 AM EDT ----- Blood count is low normal and in line with her prior readings  Recommendations: Should she note any further hematochezia, she should follow-up with her PCP

## 2021-03-10 NOTE — Telephone Encounter (Signed)
Left voicemail message to call back for review of results and recommendations.  

## 2021-03-16 NOTE — Telephone Encounter (Signed)
Left voicemail message to call back for review of results.  

## 2021-03-18 NOTE — Telephone Encounter (Signed)
Reviewed results and recommendations with patient. She verbalized understanding with no further questions at this time.  ?

## 2021-03-22 ENCOUNTER — Ambulatory Visit
Admission: RE | Admit: 2021-03-22 | Discharge: 2021-03-22 | Disposition: A | Discharge status: Home / Self Care | Payer: Medicare Other | Source: Ambulatory Visit | Attending: Physician Assistant | Admitting: Physician Assistant

## 2021-03-22 ENCOUNTER — Other Ambulatory Visit: Payer: Self-pay

## 2021-03-22 DIAGNOSIS — R778 Other specified abnormalities of plasma proteins: Secondary | ICD-10-CM | POA: Insufficient documentation

## 2021-03-22 DIAGNOSIS — I2510 Atherosclerotic heart disease of native coronary artery without angina pectoris: Secondary | ICD-10-CM | POA: Diagnosis not present

## 2021-03-22 DIAGNOSIS — I480 Paroxysmal atrial fibrillation: Secondary | ICD-10-CM

## 2021-03-22 LAB — NM MYOCAR MULTI W/SPECT W/WALL MOTION / EF
Estimated workload: 1 METS
Exercise duration (min): 0 min
Exercise duration (sec): 0 s
LV dias vol: 54 mL (ref 46–106)
LV sys vol: 18 mL
MPHR: 151 {beats}/min
Peak HR: 76 {beats}/min
Percent HR: 50 %
Rest HR: 53 {beats}/min
TID: 1.11

## 2021-03-22 MED ORDER — TECHNETIUM TC 99M TETROFOSMIN IV KIT
30.0000 | PACK | Freq: Once | INTRAVENOUS | Status: AC | PRN
  Administered 2021-03-22: 31.9 via INTRAVENOUS

## 2021-03-22 MED ORDER — TECHNETIUM TC 99M TETROFOSMIN IV KIT
9.5800 | PACK | Freq: Once | INTRAVENOUS | Status: AC | PRN
  Administered 2021-03-22: 9.58 via INTRAVENOUS

## 2021-03-22 MED ORDER — REGADENOSON 0.4 MG/5ML IV SOLN
0.4000 mg | Freq: Once | INTRAVENOUS | Status: AC
  Administered 2021-03-22: 0.4 mg via INTRAVENOUS

## 2021-03-24 ENCOUNTER — Telehealth: Payer: Self-pay | Admitting: Physician Assistant

## 2021-03-24 NOTE — Telephone Encounter (Signed)
Sondra Barges, PA-C  03/24/2021  8:43 AM EDT      No significant blockages or scar noted on stress test Normal pump function Minimal calcification of the coronary arteries  Overall, low risk stress test   Recommendations: No further cardiac testing indicated at this time Please have her come to the medical mall at her convenience for a fasting lipid panel and LFT given coronary artery calcification noted on CT imaging of stresstest Continue to recommend sleep study when she is ready to move forward with this

## 2021-03-24 NOTE — Telephone Encounter (Signed)
Attempted to call the patient. No answer- I left a message to please call back.  

## 2021-03-25 NOTE — Telephone Encounter (Signed)
Left voicemail message to call back for review of results.  

## 2021-03-26 ENCOUNTER — Encounter: Payer: Self-pay | Admitting: *Deleted

## 2021-03-26 NOTE — Telephone Encounter (Signed)
We have attempted to reach patient 3 times. Letter with results and recommendations printed and placed in outgoing mail. Closing encounter.

## 2021-03-26 NOTE — Telephone Encounter (Signed)
Voicemail box is full. 

## 2021-04-08 ENCOUNTER — Other Ambulatory Visit: Payer: Self-pay

## 2021-04-08 ENCOUNTER — Ambulatory Visit (INDEPENDENT_AMBULATORY_CARE_PROVIDER_SITE_OTHER): Payer: Medicare Other | Admitting: Cardiovascular Disease

## 2021-04-08 ENCOUNTER — Encounter: Payer: Self-pay | Admitting: Cardiovascular Disease

## 2021-04-08 VITALS — BP 120/70 | HR 52 | Ht 67.0 in | Wt 200.1 lb

## 2021-04-08 DIAGNOSIS — I10 Essential (primary) hypertension: Secondary | ICD-10-CM

## 2021-04-08 DIAGNOSIS — I480 Paroxysmal atrial fibrillation: Secondary | ICD-10-CM

## 2021-04-08 DIAGNOSIS — E785 Hyperlipidemia, unspecified: Secondary | ICD-10-CM | POA: Diagnosis not present

## 2021-04-08 MED ORDER — METOPROLOL TARTRATE 25 MG PO TABS: 25.0000 mg | ORAL_TABLET | Freq: Two times a day (BID) | ORAL | 2 refills | Status: DC

## 2021-04-08 NOTE — Patient Instructions (Signed)
Medication Instructions:  Your physician has recommended you make the following change in your medication:   DECREASE Metoprolol to 25mg  twice a day. An Rx has been sent to your pharmacy.   *If you need a refill on your cardiac medications before your next appointment, please call your pharmacy*   Lab Work: None ordered  If you have labs (blood work) drawn today and your tests are completely normal, you will receive your results only by: MyChart Message (if you have MyChart) OR A paper copy in the mail If you have any lab test that is abnormal or we need to change your treatment, we will call you to review the results.   Testing/Procedures: None ordered   Follow-Up: At [facility], you and your health needs are our priority.  As part of our continuing mission to provide you with exceptional heart care, we have created designated Provider Care Teams.  These Care Teams include your primary Cardiologist (physician) and Advanced Practice Providers (APPs -  Physician Assistants and Nurse Practitioners) who all work together to provide you with the care you need, when you need it.  We recommend signing up for the patient portal called "MyChart".  Sign up information is provided on this After Visit Summary.  MyChart is used to connect with patients for Virtual Visits (Telemedicine).  Patients are able to view lab/test results, encounter notes, upcoming appointments, etc.  Non-urgent messages can be sent to your provider as well.   To learn more about what you can do with MyChart, go to [facility].    Your next appointment:   Your physician wants you to follow-up in: 6 months You will receive a reminder letter in the mail two months in advance. If you don't receive a letter, please call our office to schedule the follow-up appointment.   The format for your next appointment:   In Person  Provider:   You may see [email or web address], MD or one of the following Advanced Practice  Providers on your designated Care Team:   Lorine Bears, NP Nicolasa Ducking, PA-C Eula Listen, PA-C Cadence [place], [place], NP   Other Instructions N/A

## 2021-04-08 NOTE — Progress Notes (Signed)
Cardiology Office Note   Date:  04/08/2021   ID:  Cynthia Lucas, DOB 05-Jun-1951, MRN [id number]  PCP:  Toy Cookey, FNP  Cardiologist:   Lorine Bears, MD   Chief Complaint  Patient presents with   Other    1 month f/u myoview no complaints today. Meds reviewed verbally with pt.      History of Present Illness: Cynthia Lucas is a 70 y.o. female who presents for a follow-up visit regarding paroxysmal atrial fibrillation and mildly elevated troponin in the setting of renal failure and acute illness.  She has known history of essential hypertension, GERD, type 2 diabetes, sleep apnea not on CPAP and essential tremors.  She was hospitalized in March of this year with left flank pain and coffee-ground emesis. She was found to be in A. fib with RVR on presentation which was a new diagnosis.  In addition, she was noted to have acute renal failure with creatinine of 6.5 as well as a gradual drop in hemoglobin to 11.5.  She was treated with diltiazem for rate control.  Troponin was mildly elevated thought to be due to supply demand ischemia.  Echocardiogram showed normal LV systolic function, mild to moderate mitral regurgitation, moderately dilated left atrium and mildly dilated ascending aorta. She converted sinus rhythm during hospitalization.  EGD showed no significant abnormalities.  She was started on Eliquis before hospital discharge.  Acute renal failure was felt to be due to obstructive nephropathy with subsequent improvement after ureteral stent placement. Renal function gradually improved with most recent creatinine of 1.5.  Lexiscan Myoview was done last month which showed no evidence of ischemia or infarct.  Was noted to have minimal coronary calcifications on CT attenuation images.  She is doing well at the present time and denies chest pain, shortness of breath or palpitations.  No side effects with Eliquis anticoagulation.  She does report mild dizziness especially when she  stands up quickly.  She is noted to be mildly bradycardic today.   Past Medical History:  Diagnosis Date   Atrial fibrillation (HCC)    Cough    CHRONIC   Diabetes mellitus without complication (HCC)    GERD (gastroesophageal reflux disease)    History of kidney stones    Hypertension    Sleep apnea    no cpap   Tremor    HEAD    Past Surgical History:  Procedure Laterality Date   ABDOMINAL HYSTERECTOMY     CATARACT EXTRACTION W/PHACO Left 03/07/2018   Procedure: CATARACT EXTRACTION PHACO AND INTRAOCULAR LENS PLACEMENT (IOC);  Surgeon: Galen Manila, MD;  Location: [facility];  Service: Ophthalmology;  Laterality: Left;  Lot # [id number] H [place]: 04:19.8 AP%: 19.9 CDE: 51.70   CATARACT EXTRACTION W/PHACO Right 07/07/2020   Procedure: CATARACT EXTRACTION PHACO AND INTRAOCULAR LENS PLACEMENT (IOC) RIGHT DIABETIC 7.75 00:42.8;  Surgeon: Galen Manila, MD;  Location: [facility];  Service: Ophthalmology;  Laterality: Right;  diabetic - oral meds   CESAREAN SECTION     COLONOSCOPY     CYSTOSCOPY W/ RETROGRADES Right 01/07/2019   Procedure: CYSTOSCOPY WITH RETROGRADE PYELOGRAM;  Surgeon: Riki Altes, MD;  Location: [facility];  Service: Urology;  Laterality: Right;   CYSTOSCOPY W/ URETERAL STENT PLACEMENT Right 12/28/2020   Procedure: CYSTOSCOPY WITH RETROGRADE PYELOGRAM/URETERAL STENT PLACEMENT;  Surgeon: Bjorn Pippin, MD;  Location: [facility];  Service: Urology;  Laterality: Right;   CYSTOSCOPY WITH STENT PLACEMENT Right 01/07/2019   Procedure: CYSTOSCOPY WITH STENT PLACEMENT;  Surgeon: Lonna Cobb,  Verna Czech, MD;  Location: [facility];  Service: Urology;  Laterality: Right;   CYSTOSCOPY WITH URETEROSCOPY, STONE BASKETRY AND STENT PLACEMENT Right 01/29/2019   Procedure: CYSTOSCOPY/URETEROSCOPY/HOLMIUM LASER/STENT EXCHANGE;  Surgeon: Riki Altes, MD;  Location: [facility];  Service: Urology;  Laterality: Right;   CYSTOSCOPY/URETEROSCOPY/HOLMIUM LASER/STENT PLACEMENT Right 02/05/2021    Procedure: CYSTOSCOPY/URETEROSCOPY/HOLMIUM LASER/STENT EXCHANGE;  Surgeon: Sondra Come, MD;  Location: [facility];  Service: Urology;  Laterality: Right;   ESOPHAGOGASTRODUODENOSCOPY N/A 01/02/2021   Procedure: ESOPHAGOGASTRODUODENOSCOPY (EGD);  Surgeon: Toledo, Boykin Nearing, MD;  Location: [facility];  Service: Gastroenterology;  Laterality: N/A;   JOINT REPLACEMENT       Current Outpatient Medications  Medication Sig Dispense Refill   ACCU-CHEK AVIVA PLUS test strip      apixaban (ELIQUIS) 5 MG TABS tablet Take 1 tablet (5 mg total) by mouth 2 (two) times daily. Blood thinner. 180 tablet 1   atorvastatin (LIPITOR) 40 MG tablet Take 1 tablet (40 mg total) by mouth at bedtime. 90 tablet 1   brimonidine-timolol (COMBIGAN) 0.2-0.5 % ophthalmic solution Place 1 drop into both eyes every 12 (twelve) hours.     diltiazem (CARDIZEM CD) 180 MG 24 hr capsule Take 1 capsule (180 mg total) by mouth daily. To control your heart rate. 90 capsule 1   hydrochlorothiazide (HYDRODIURIL) 25 MG tablet Take 0.5 tablets (12.5 mg total) by mouth daily. 15 tablet 2   iron polysaccharides (NIFEREX) 150 MG capsule Take 1 capsule (150 mg total) by mouth daily. Can take any over-the-counter.     LUMIGAN 0.01 % SOLN Place 1 drop into the left eye at bedtime.      Magnesium 400 MG TABS Take 1 tablet by mouth daily.     metFORMIN (GLUCOPHAGE) 500 MG tablet Take 2 tablets (1,000 mg total) by mouth 2 (two) times daily with a meal. (Patient taking differently: Take 500 mg by mouth 2 (two) times daily with a meal.) 120 tablet 2   metoprolol tartrate (LOPRESSOR) 50 MG tablet Take 1 tablet (50 mg total) by mouth 2 (two) times daily. To control your heart rate. 180 tablet 1   pantoprazole (PROTONIX) 40 MG tablet Take 1 tablet (40 mg total) by mouth 2 (two) times daily. 60 tablet 2   Potassium Citrate 15 MEQ (1620 MG) TBCR Take 1 tablet by mouth in the morning and at bedtime. To prevent recurrent uric acid kidney stones 60 tablet  11   tamsulosin (FLOMAX) 0.4 MG CAPS capsule Take 0.4 mg by mouth.     No current facility-administered medications for this visit.    Allergies:   Other    Social History:  The patient  reports that she has never smoked. She has never used smokeless tobacco. She reports current alcohol use. She reports that she does not use drugs.   Family History:  The patient's family history includes Heart disease in her mother.    ROS:  Please see the history of present illness.   Otherwise, review of systems are positive for none.   All other systems are reviewed and negative.    PHYSICAL EXAM: VS:  BP 120/70 (BP Location: Left Arm, Patient Position: Sitting, Cuff Size: Normal)   Pulse (!) 52   Ht 5\' 7"  (1.702 m)   Wt 200 lb 2 oz (90.8 kg)   SpO2 98%   BMI 31.34 kg/m  , BMI Body mass index is 31.34 kg/m. GEN: Well nourished, well developed, in no acute distress  HEENT: normal  Neck: no JVD, carotid bruits, or masses Cardiac: RRR; no murmurs, rubs, or gallops,no edema  Respiratory:  clear to auscultation bilaterally, normal work of breathing GI: soft, nontender, nondistended, + BS MS: no deformity or atrophy  Skin: warm and dry, no rash Neuro:  Strength and sensation are intact Psych: euthymic mood, full affect   EKG:  EKG is ordered today. The ekg ordered today demonstrates sinus bradycardia with a heart rate of 52 bpm.  No significant ST or T wave changes.   Recent Labs: 12/27/2020: B Natriuretic Peptide 530.7; TSH 1.858 01/03/2021: Magnesium 1.6 01/21/2021: ALT 15; BUN 32; Creatinine, Ser 1.97; Potassium 4.0; Sodium 139 03/09/2021: Hemoglobin 11.7; Platelets 308    Lipid Panel No results found for: CHOL, TRIG, HDL, CHOLHDL, VLDL, LDLCALC, LDLDIRECT    Wt Readings from Last 3 Encounters:  04/08/21 200 lb 2 oz (90.8 kg)  03/09/21 195 lb (88.5 kg)  02/17/21 200 lb (90.7 kg)      No flowsheet data found.    ASSESSMENT AND PLAN:  1.  Mildly elevated troponin in the  setting of acute illness thought to be due to supply demand ischemia.  Lexiscan Myoview done last month showed no evidence of ischemia.  No further cardiac work-up is needed at this time.  2.  Paroxysmal atrial fibrillation: She is maintaining in sinus rhythm but she is mildly bradycardic with some dizziness.  I elected to decrease metoprolol to 25 mg twice daily.  She is also on diltiazem which was unchanged.  Continue anticoagulation with Eliquis given CHA2DS2-VASc score of 4.  3.  Essential hypertension: Blood pressure is well controlled.  4. Hyperlipidemia: Recent CT attenuation images showed minimal coronary artery calcifications.  Continue treatment with atorvastatin with a target LDL of less than 70 due to this and also diabetes.   Disposition:   FU with me in 6 months.  Signed,  Lorine Bears, MD  04/08/2021 9:16 AM    [facility]

## 2021-05-19 ENCOUNTER — Ambulatory Visit: Payer: Medicare Other | Admitting: Urology

## 2021-05-21 ENCOUNTER — Encounter: Payer: Self-pay | Admitting: Urology

## 2021-12-23 ENCOUNTER — Other Ambulatory Visit: Payer: Self-pay | Admitting: Physician Assistant

## 2021-12-23 DIAGNOSIS — Z1231 Encounter for screening mammogram for malignant neoplasm of breast: Secondary | ICD-10-CM

## 2021-12-23 DIAGNOSIS — Z13820 Encounter for screening for osteoporosis: Secondary | ICD-10-CM

## 2022-02-22 ENCOUNTER — Ambulatory Visit
Admission: RE | Admit: 2022-02-22 | Discharge: 2022-02-22 | Disposition: A | Discharge status: Home / Self Care | Payer: Medicare Other | Source: Ambulatory Visit | Attending: Physician Assistant | Admitting: Physician Assistant

## 2022-02-22 DIAGNOSIS — M8588 Other specified disorders of bone density and structure, other site: Secondary | ICD-10-CM | POA: Diagnosis not present

## 2022-02-22 DIAGNOSIS — E119 Type 2 diabetes mellitus without complications: Secondary | ICD-10-CM | POA: Diagnosis not present

## 2022-02-22 DIAGNOSIS — Z13820 Encounter for screening for osteoporosis: Secondary | ICD-10-CM | POA: Insufficient documentation

## 2022-02-22 DIAGNOSIS — Z780 Asymptomatic menopausal state: Secondary | ICD-10-CM | POA: Insufficient documentation

## 2022-04-25 NOTE — Progress Notes (Signed)
Cardiology Office Note    Date:  04/26/2022   ID:  Cynthia Lucas, DOB 07-08-1951, MRN [id number]  PCP:  Delman Cheadle, PA  Cardiologist:  Lorine Bears, MD  Electrophysiologist:  None   Chief Complaint: Follow-up  History of Present Illness:   Cynthia Lucas is a 71 y.o. female with history of minimal coronary artery calcification on CT imaging, PAF, DM2, HTN, HLD, tremor, sleep apnea not on CPAP secondary to intolerance, essential tremor, and GERD who presents for follow-up of PAF.   She was admitted in 12/2020 with left flank pain and coffee-ground emesis.  She was found to be in A. fib with RVR upon presentation, which was a new diagnosis.  In addition, she was noted to have ARF with a creatinine of 6.5 as well as a gradual drop in her hemoglobin to 11.5.  She was rate controlled with diltiazem.  High-sensitivity troponin was mildly elevated and felt to be related to supply demand ischemia.  Echo showed a normal LV systolic function with an EF of 55 to 60%, no regional wall motion abnormalities, mild LVH, normal RV systolic function and ventricular cavity size, normal PASP, mild to moderate mitral regurgitation, moderately dilated left atrium, and a mildly dilated ascending aorta.  She converted to sinus rhythm during her hospital admission.  EGD, to further evaluate her drop in hemoglobin, showed no significant abnormalities.  She was started on Eliquis prior to hospital discharge.  Her acute renal failure was felt to be due to obstructive nephropathy with subsequent improvement following ureteral stent placement.  Subsequent Lexiscan MPI in 03/2021 showed no evidence of ischemia or infarct with minimal coronary artery calcification on CT attenuated corrected images.  She was last seen in the office on 04/08/2021 and was without symptoms of angina or decompensation.  She comes in doing well from a cardiac perspective, and is without symptoms of angina or decompensation.  She self  discontinued cardiac medications including apixaban, atorvastatin, diltiazem, and metoprolol several months ago.  She indicates she did this as she was concerned that these medications were leading to halitosis.  She has been without symptoms of dyspnea, palpitations, dizziness, presyncope, or syncope.  No significant lower extremity swelling.  She does report ongoing fatigue which she has attributed to her underlying untreated sleep apnea.  Otherwise, she does not have any issues or concerns at this time.   Labs independently reviewed: 03/2021 - Hgb 11.7, PLT 308 01/2021 - albumin 4.1, AST/ALT normal, potassium 4.0, BUN 32, SCr 1.97, magnesium 1.6 12/2020 - A1c 7.9, TSH normal  Past Medical History:  Diagnosis Date   Atrial fibrillation (HCC)    Cough    CHRONIC   Diabetes mellitus without complication (HCC)    GERD (gastroesophageal reflux disease)    History of kidney stones    Hypertension    Sleep apnea    no cpap   Tremor    HEAD    Past Surgical History:  Procedure Laterality Date   ABDOMINAL HYSTERECTOMY     CATARACT EXTRACTION W/PHACO Left 03/07/2018   Procedure: CATARACT EXTRACTION PHACO AND INTRAOCULAR LENS PLACEMENT (IOC);  Surgeon: Galen Manila, MD;  Location: [facility];  Service: Ophthalmology;  Laterality: Left;  Lot # [id number] H [place]: 04:19.8 AP%: 19.9 CDE: 51.70   CATARACT EXTRACTION W/PHACO Right 07/07/2020   Procedure: CATARACT EXTRACTION PHACO AND INTRAOCULAR LENS PLACEMENT (IOC) RIGHT DIABETIC 7.75 00:42.8;  Surgeon: Galen Manila, MD;  Location: [facility];  Service: Ophthalmology;  Laterality: Right;  diabetic -  oral meds   CESAREAN SECTION     COLONOSCOPY     CYSTOSCOPY W/ RETROGRADES Right 01/07/2019   Procedure: CYSTOSCOPY WITH RETROGRADE PYELOGRAM;  Surgeon: Riki Altes, MD;  Location: [facility];  Service: Urology;  Laterality: Right;   CYSTOSCOPY W/ URETERAL STENT PLACEMENT Right 12/28/2020   Procedure: CYSTOSCOPY WITH RETROGRADE  PYELOGRAM/URETERAL STENT PLACEMENT;  Surgeon: Bjorn Pippin, MD;  Location: [facility];  Service: Urology;  Laterality: Right;   CYSTOSCOPY WITH STENT PLACEMENT Right 01/07/2019   Procedure: CYSTOSCOPY WITH STENT PLACEMENT;  Surgeon: Riki Altes, MD;  Location: [facility];  Service: Urology;  Laterality: Right;   CYSTOSCOPY WITH URETEROSCOPY, STONE BASKETRY AND STENT PLACEMENT Right 01/29/2019   Procedure: CYSTOSCOPY/URETEROSCOPY/HOLMIUM LASER/STENT EXCHANGE;  Surgeon: Riki Altes, MD;  Location: [facility];  Service: Urology;  Laterality: Right;   CYSTOSCOPY/URETEROSCOPY/HOLMIUM LASER/STENT PLACEMENT Right 02/05/2021   Procedure: CYSTOSCOPY/URETEROSCOPY/HOLMIUM LASER/STENT EXCHANGE;  Surgeon: Sondra Come, MD;  Location: [facility];  Service: Urology;  Laterality: Right;   ESOPHAGOGASTRODUODENOSCOPY N/A 01/02/2021   Procedure: ESOPHAGOGASTRODUODENOSCOPY (EGD);  Surgeon: Toledo, Boykin Nearing, MD;  Location: [facility];  Service: Gastroenterology;  Laterality: N/A;   JOINT REPLACEMENT      Current Medications: Current Meds  Medication Sig   brimonidine-timolol (COMBIGAN) 0.2-0.5 % ophthalmic solution Place 1 drop into both eyes every 12 (twelve) hours.   hydrochlorothiazide (HYDRODIURIL) 25 MG tablet Take 0.5 tablets (12.5 mg total) by mouth daily.   JARDIANCE 25 MG TABS tablet Take 25 mg by mouth daily.   LUMIGAN 0.01 % SOLN Place 1 drop into the left eye at bedtime.    tamsulosin (FLOMAX) 0.4 MG CAPS capsule Take 0.4 mg by mouth. Takes occasionally    Allergies:   Other   Social History   Socioeconomic History   Marital status: Divorced    Spouse name: Not on file   Number of children: Not on file   Years of education: Not on file   Highest education level: Not on file  Occupational History   Not on file  Tobacco Use   Smoking status: Never   Smokeless tobacco: Never  Vaping Use   Vaping Use: Never used  Substance and Sexual Activity   Alcohol use: Yes    Comment: OCCAS    Drug use: Never   Sexual activity: Yes    Birth control/protection: None  Other Topics Concern   Not on file  Social History Narrative   Not on file   Social Determinants of Health   Financial Resource Strain: Not on file  Food Insecurity: Not on file  Transportation Needs: Not on file  Physical Activity: Not on file  Stress: Not on file  Social Connections: Not on file     Family History:  The patient's family history includes Heart disease in her mother.  ROS:   12-point review of system is negative unless otherwise noted in the HPI.   EKGs/Labs/Other Studies Reviewed:    Studies reviewed were summarized above. The additional studies were reviewed today:  2D echo 12/29/2020: 1. Left ventricular ejection fraction, by estimation, is 55 to 60%. The  left ventricle has normal function. The left ventricle has no regional  wall motion abnormalities. There is mild left ventricular hypertrophy.  Left ventricular diastolic function  could not be evaluated.   2. Right ventricular systolic function is normal. The right ventricular  size is normal. There is normal pulmonary artery systolic pressure.   3. Left atrial size was moderately dilated.  4. The mitral valve is normal in structure. Mild to moderate mitral valve  regurgitation. No evidence of mitral stenosis.   5. The aortic valve is normal in structure. Aortic valve regurgitation is  mild. Mild aortic valve sclerosis is present, with no evidence of aortic  valve stenosis.   6. Aortic dilatation noted. There is mild dilatation of the ascending  aorta, measuring 41 mm.   7. Only parasternal views.  __________  Eugenie Birks MPI 03/22/2021: Normal pharmacologic myocardial perfusion stress test without significant ischemia or scar. The left ventricular ejection fraction is normal by visual estimation and Siemens calculation (LVEF 67%). QGS LVEF (40%) is artifactually low due to gating parameters. Minimal coronary artery  calcification is noted on the attenuation correction CT. This is a low risk study.    EKG:  EKG is ordered today.  The EKG ordered today demonstrates sinus bradycardia, 58 bpm, significant underlying baseline artifact secondary to resting tremor, no acute ST-T changes  Recent Labs: 04/26/2022: ALT 11; BUN 18; Creatinine, Ser 1.60; Hemoglobin 13.5; Platelets 331; Potassium 3.7; Sodium 146  Recent Lipid Panel    Component Value Date/Time   CHOL 248 (H) 04/26/2022 1601   TRIG 273 (H) 04/26/2022 1601   HDL 44 04/26/2022 1601   CHOLHDL 5.6 04/26/2022 1601   VLDL 55 (H) 04/26/2022 1601   LDLCALC 149 (H) 04/26/2022 1601    PHYSICAL EXAM:    VS:  BP 130/70 (BP Location: Left Arm, Patient Position: Sitting, Cuff Size: Large)   Pulse (!) 58   Ht 5\' 6"  (1.676 m)   Wt 202 lb (91.6 kg)   SpO2 98%   BMI 32.60 kg/m   BMI: Body mass index is 32.6 kg/m.  Physical Exam Vitals reviewed.  Constitutional:      Appearance: She is well-developed.  HENT:     Head: Normocephalic and atraumatic.  Eyes:     General:        Right eye: No discharge.        Left eye: No discharge.  Neck:     Vascular: No JVD.  Cardiovascular:     Rate and Rhythm: Regular rhythm. Bradycardia present.     Pulses:          Posterior tibial pulses are 2+ on the right side and 2+ on the left side.     Heart sounds: Normal heart sounds, S1 normal and S2 normal. Heart sounds not distant. No midsystolic click and no opening snap. No murmur heard.    No friction rub.  Pulmonary:     Effort: Pulmonary effort is normal. No respiratory distress.     Breath sounds: Normal breath sounds. No decreased breath sounds, wheezing or rales.  Chest:     Chest wall: No tenderness.  Abdominal:     General: There is no distension.  Musculoskeletal:     Cervical back: Normal range of motion.     Right lower leg: No edema.     Left lower leg: No edema.  Skin:    General: Skin is warm and dry.     Nails: There is no clubbing.   Neurological:     Mental Status: She is alert and oriented to person, place, and time.     Motor: Tremor present.  Psychiatric:        Speech: Speech normal.        Behavior: Behavior normal.        Thought Content: Thought content normal.  Judgment: Judgment normal.     Wt Readings from Last 3 Encounters:  04/26/22 202 lb (91.6 kg)  04/08/21 200 lb 2 oz (90.8 kg)  03/09/21 195 lb (88.5 kg)     ASSESSMENT & PLAN:   PAF: Maintaining sinus rhythm with a mildly bradycardic rate not on AV nodal blocking medications.  CHA2DS2-VASc at least 4 (HTN, age x1, DM2, sex category).  Resume apixaban 5 mg twice daily (she does not meet reduced dosing criteria).  Check CBC and CMP.  HTN: Blood pressure is reasonably controlled in the office today.  She remains on HCTZ.  Follow-up with PCP as directed.  Coronary artery calcification/HLD: No symptoms concerning for angina.  Prior stress test showed no evidence of ischemia.  No indication for further cardiac testing at this time.  Resume atorvastatin 40 mg daily.  Goal LDL less than 70.  Check CMP, lipid panel, and direct LDL.    Disposition: F/u with Dr. Kirke Corin or an APP in 6 months.   Medication Adjustments/Labs and Tests Ordered: Current medicines are reviewed at length with the patient today.  Concerns regarding medicines are outlined above. Medication changes, Labs and Tests ordered today are summarized above and listed in the Patient Instructions accessible in Encounters.   Signed, Eula Listen, PA-C 04/26/2022 4:39 PM     [facility] [place] [address] [place], [place] [address] [phone]

## 2022-04-26 ENCOUNTER — Encounter: Payer: Self-pay | Admitting: Physician Assistant

## 2022-04-26 ENCOUNTER — Ambulatory Visit (INDEPENDENT_AMBULATORY_CARE_PROVIDER_SITE_OTHER): Payer: Medicare Other | Admitting: Physician Assistant

## 2022-04-26 ENCOUNTER — Other Ambulatory Visit
Admission: RE | Admit: 2022-04-26 | Discharge: 2022-04-26 | Disposition: A | Discharge status: Home / Self Care | Payer: Medicare Other | Source: Ambulatory Visit | Attending: Physician Assistant | Admitting: Physician Assistant

## 2022-04-26 VITALS — BP 130/70 | HR 58 | Ht 66.0 in | Wt 202.0 lb

## 2022-04-26 DIAGNOSIS — N179 Acute kidney failure, unspecified: Secondary | ICD-10-CM | POA: Diagnosis present

## 2022-04-26 DIAGNOSIS — I2510 Atherosclerotic heart disease of native coronary artery without angina pectoris: Secondary | ICD-10-CM

## 2022-04-26 DIAGNOSIS — I2584 Coronary atherosclerosis due to calcified coronary lesion: Secondary | ICD-10-CM

## 2022-04-26 DIAGNOSIS — I480 Paroxysmal atrial fibrillation: Secondary | ICD-10-CM | POA: Insufficient documentation

## 2022-04-26 DIAGNOSIS — E785 Hyperlipidemia, unspecified: Secondary | ICD-10-CM | POA: Diagnosis present

## 2022-04-26 DIAGNOSIS — R5382 Chronic fatigue, unspecified: Secondary | ICD-10-CM | POA: Insufficient documentation

## 2022-04-26 DIAGNOSIS — D649 Anemia, unspecified: Secondary | ICD-10-CM | POA: Insufficient documentation

## 2022-04-26 DIAGNOSIS — I10 Essential (primary) hypertension: Secondary | ICD-10-CM | POA: Diagnosis not present

## 2022-04-26 LAB — CBC
HCT: 41.9 % (ref 36.0–46.0)
Hemoglobin: 13.5 g/dL (ref 12.0–15.0)
MCH: 26.4 pg (ref 26.0–34.0)
MCHC: 32.2 g/dL (ref 30.0–36.0)
MCV: 81.8 fL (ref 80.0–100.0)
Platelets: 331 10*3/uL (ref 150–400)
RBC: 5.12 MIL/uL — ABNORMAL HIGH (ref 3.87–5.11)
RDW: 14.6 % (ref 11.5–15.5)
WBC: 9.4 10*3/uL (ref 4.0–10.5)
nRBC: 0 % (ref 0.0–0.2)

## 2022-04-26 LAB — COMPREHENSIVE METABOLIC PANEL
ALT: 11 U/L (ref 0–44)
AST: 15 U/L (ref 15–41)
Albumin: 4.3 g/dL (ref 3.5–5.0)
Alkaline Phosphatase: 93 U/L (ref 38–126)
Anion gap: 9 (ref 5–15)
BUN: 18 mg/dL (ref 8–23)
CO2: 24 mmol/L (ref 22–32)
Calcium: 9.4 mg/dL (ref 8.9–10.3)
Chloride: 113 mmol/L — ABNORMAL HIGH (ref 98–111)
Creatinine, Ser: 1.6 mg/dL — ABNORMAL HIGH (ref 0.44–1.00)
GFR, Estimated: 34 mL/min — ABNORMAL LOW (ref >60)
Glucose, Bld: 124 mg/dL — ABNORMAL HIGH (ref 70–99)
Potassium: 3.7 mmol/L (ref 3.5–5.1)
Sodium: 146 mmol/L — ABNORMAL HIGH (ref 135–145)
Total Bilirubin: 0.4 mg/dL (ref 0.3–1.2)
Total Protein: 7.9 g/dL (ref 6.5–8.1)

## 2022-04-26 LAB — LIPID PANEL
Cholesterol: 248 mg/dL — ABNORMAL HIGH (ref 0–200)
HDL: 44 mg/dL (ref >40)
LDL Cholesterol: 149 mg/dL — ABNORMAL HIGH (ref 0–99)
Total CHOL/HDL Ratio: 5.6 RATIO
Triglycerides: 273 mg/dL — ABNORMAL HIGH (ref <150)
VLDL: 55 mg/dL — ABNORMAL HIGH (ref 0–40)

## 2022-04-26 LAB — LDL CHOLESTEROL, DIRECT: Direct LDL: 156 mg/dL — ABNORMAL HIGH (ref 0–99)

## 2022-04-26 MED ORDER — ATORVASTATIN CALCIUM 40 MG PO TABS: 40.0000 mg | ORAL_TABLET | Freq: Every day | ORAL | 3 refills | Status: DC

## 2022-04-26 MED ORDER — APIXABAN 5 MG PO TABS: 5.0000 mg | ORAL_TABLET | Freq: Two times a day (BID) | ORAL | 3 refills | Status: DC

## 2022-04-26 NOTE — Patient Instructions (Signed)
Medication Instructions:  Your physician has recommended you make the following change in your medication:   RESTART Eliquis 5 mg twice daily RESTART Atorvastatin 40 mg once daily   *If you need a refill on your cardiac medications before your next appointment, please call your pharmacy*   Lab Work: CBC, CMET, Lipid, and LDL to be done over at the [facility] entrance. Please make sure to stop at registration.   If you have labs (blood work) drawn today and your tests are completely normal, you will receive your results only by: MyChart Message (if you have MyChart) OR A paper copy in the mail If you have any lab test that is abnormal or we need to change your treatment, we will call you to review the results.   Testing/Procedures: None   Follow-Up: At [facility], you and your health needs are our priority.  As part of our continuing mission to provide you with exceptional heart care, we have created designated Provider Care Teams.  These Care Teams include your primary Cardiologist (physician) and Advanced Practice Providers (APPs -  Physician Assistants and Nurse Practitioners) who all work together to provide you with the care you need, when you need it.  We recommend signing up for the patient portal called "MyChart".  Sign up information is provided on this After Visit Summary.  MyChart is used to connect with patients for Virtual Visits (Telemedicine).  Patients are able to view lab/test results, encounter notes, upcoming appointments, etc.  Non-urgent messages can be sent to your provider as well.   To learn more about what you can do with MyChart, go to [email or web address].    Your next appointment:   6 month(s)  The format for your next appointment:   In Person  Provider:   Lorine Bears, MD or Eula Listen, PA-C    Important Information About Sugar

## 2022-04-28 ENCOUNTER — Telehealth: Payer: Self-pay | Admitting: *Deleted

## 2022-04-28 NOTE — Telephone Encounter (Signed)
Left voicemail message to call back for review of results.  

## 2022-04-28 NOTE — Telephone Encounter (Signed)
-----   Message from Sondra Barges, PA-C sent at 04/28/2022  7:37 AM EDT ----- Sodium minimally elevated, potassium normal, random glucose okay, renal function mildly elevated, though improved from prior readings, liver function normal, blood count okay, total cholesterol elevated, triglycerides elevated (nonfasting sample), HDL at goal, and elevated LDL.  Patient had been off of medications when labs were obtained.  Recommend she continue current medical therapy as discussed at our office visit.  She should reach out to her PCP to see if she should establish with nephrology if not already.

## 2022-05-09 NOTE — Telephone Encounter (Signed)
I called and spoke with the patient. I advised her that she was previously seen by [facility]. I asked her to please call and set up an appointment with them. I offered to give her the office phone number, but she was currently at [facility] and had nothing to write on. I offered to call her right back and leave a message on her voice mail with the contact # for nephrology. The patient was agreeable, but when I called her back, her voice mail was full.  Will follow up with her at a later time to make sure she has the contact # for nephrology.   [facility] [phone]   Labs sent via Epic fax function.

## 2022-05-09 NOTE — Telephone Encounter (Signed)
Call received from [facility].  They have confirmed the patient was last seen there June 2022 with Dr. Wynelle Link. I advised I will fax a copy of her lab work over and will ask the patient to call for a follow up appointment with their office.

## 2022-05-09 NOTE — Telephone Encounter (Signed)
I spoke with the patient regarding her lab results. She voices understanding of results and recommendations as stated below by Eula Listen, PA.  The patient confirms she has seen nephrology in the past, but missed her last appointment. She could not recall who she saw even when I read out the names of some the local nephrologist.  I have asked her to please call whomever she might have seen to follow up back up with them.  She is aware I will call [facility] to see if she has been seen there and fax over her most recent lab results.  The patient voices understanding and is agreeable.  Attempted to call [facility] at [phone]. No answer- I left a message to please call me back to confirm if they have seen this patient so labs can be faxed to them.

## 2022-05-12 NOTE — Telephone Encounter (Signed)
Left detailed voicemail message with number for [facility] at [phone] and instructions to call back if any further questions.

## 2022-08-12 LAB — PROINSULIN/INSULIN RATIO
Insulin: 40 u[IU]/mL — ABNORMAL HIGH
Proinsulin: 32.6 pmol/L — ABNORMAL HIGH

## 2022-11-01 ENCOUNTER — Ambulatory Visit: Payer: 59 | Attending: Medical | Admitting: Medical

## 2022-11-01 ENCOUNTER — Other Ambulatory Visit: Payer: Self-pay | Admitting: *Deleted

## 2022-11-01 ENCOUNTER — Encounter: Payer: Self-pay | Admitting: Medical

## 2022-11-01 VITALS — BP 160/84 | HR 80 | Ht 67.0 in | Wt 213.2 lb

## 2022-11-01 DIAGNOSIS — I2510 Atherosclerotic heart disease of native coronary artery without angina pectoris: Secondary | ICD-10-CM | POA: Diagnosis not present

## 2022-11-01 DIAGNOSIS — E785 Hyperlipidemia, unspecified: Secondary | ICD-10-CM | POA: Diagnosis not present

## 2022-11-01 DIAGNOSIS — I480 Paroxysmal atrial fibrillation: Secondary | ICD-10-CM

## 2022-11-01 DIAGNOSIS — I10 Essential (primary) hypertension: Secondary | ICD-10-CM

## 2022-11-01 DIAGNOSIS — I2584 Coronary atherosclerosis due to calcified coronary lesion: Secondary | ICD-10-CM

## 2022-11-01 DIAGNOSIS — E1165 Type 2 diabetes mellitus with hyperglycemia: Secondary | ICD-10-CM

## 2022-11-01 MED ORDER — ATORVASTATIN CALCIUM 40 MG PO TABS: 40.0000 mg | ORAL_TABLET | Freq: Every day | ORAL | 2 refills | Status: DC

## 2022-11-01 MED ORDER — HYDROCHLOROTHIAZIDE 25 MG PO TABS: 12.5000 mg | ORAL_TABLET | Freq: Every day | ORAL | 2 refills | Status: DC

## 2022-11-01 MED ORDER — JARDIANCE 25 MG PO TABS: 25.0000 mg | ORAL_TABLET | Freq: Every day | ORAL | 2 refills | Status: DC

## 2022-11-01 MED ORDER — APIXABAN 5 MG PO TABS: 5.0000 mg | ORAL_TABLET | Freq: Two times a day (BID) | ORAL | 1 refills | Status: AC

## 2022-11-01 NOTE — Progress Notes (Signed)
Cardiology Office Note:    Date:  11/01/2022   ID:  Geoffrey Mankin, DOB October 24, 1950, MRN [id number]  PCP:  Ane Payment, [place] (Inactive)  [place] Cardiologist:  Kathlyn Sacramento, MD  [place] Electrophysiologist:  None   Referring MD: No ref. provider found   Chief Complaint: 6 month follow-up  History of Present Illness:    Cynthia Lucas is a 72 y.o. female with a hx of minimal coronary artery calcification on CT imaging, PAF, DM2, HTN, HLD, tremor, sleep apnea not on CPAP secondary to intolerance, essential tremor, and GERD who presents for follow-up of PAF.   She was admitted in 12/2020 with left flank pain and coffee-ground emesis.  She was found to be in A. fib with RVR upon presentation, which was a new diagnosis.  In addition, she was noted to have ARF with a creatinine of 6.5 as well as a gradual drop in her hemoglobin to 11.5.  She was rate controlled with diltiazem.  High-sensitivity troponin was mildly elevated and felt to be related to supply demand ischemia.  Echo showed a normal LV systolic function with an EF of 55 to 60%, no regional wall motion abnormalities, mild LVH, normal RV systolic function and ventricular cavity size, normal PASP, mild to moderate mitral regurgitation, moderately dilated left atrium, and a mildly dilated ascending aorta.  She converted to sinus rhythm during her hospital admission.  EGD, to further evaluate her drop in hemoglobin, showed no significant abnormalities.  She was started on Eliquis prior to hospital discharge.  Her acute renal failure was felt to be due to obstructive nephropathy with subsequent improvement following ureteral stent placement.  Subsequent Lexiscan MPI in 03/2021 showed no evidence of ischemia or infarct with minimal coronary artery calcification on CT attenuated corrected images.  She was last seen in the office on 04/08/2021 and was without symptoms of angina or decompensation.  Patient was last seen July 2023  and was doing well from a cardiac perspective.  Patient was maintaining sinus rhythm, mildly bradycardic not on AV nodal blocking medications.  Today, the patient is overall doing well from a cardiac perspective. EKG shows normal rhythm. She needs anew prescription of Eliquis. She has been off medications for 2 weeks because the names faded. She denies chest pain. She has unchanged SOB. BP is a little high, but has not had medications. She denies lower leg edema, orthopnea or pnd.   Past Medical History:  Diagnosis Date   Atrial fibrillation ([place])    Cough    CHRONIC   Diabetes mellitus without complication (HCC)    GERD (gastroesophageal reflux disease)    History of kidney stones    Hypertension    Sleep apnea    no cpap   Tremor    HEAD    Past Surgical History:  Procedure Laterality Date   ABDOMINAL HYSTERECTOMY     CATARACT EXTRACTION W/PHACO Left 03/07/2018   Procedure: CATARACT EXTRACTION PHACO AND INTRAOCULAR LENS PLACEMENT ([place]);  Surgeon: Birder Robson, MD;  Location: [facility];  Service: Ophthalmology;  Laterality: Left;  Lot # [id number] H [place]: 04:19.8 AP%: 19.9 CDE: 51.70   CATARACT EXTRACTION W/PHACO Right 07/07/2020   Procedure: CATARACT EXTRACTION PHACO AND INTRAOCULAR LENS PLACEMENT ([facility]) RIGHT DIABETIC 7.75 00:42.8;  Surgeon: Birder Robson, MD;  Location: [place];  Service: Ophthalmology;  Laterality: Right;  diabetic - oral meds   CESAREAN SECTION     COLONOSCOPY     CYSTOSCOPY W/ RETROGRADES Right 01/07/2019  Procedure: CYSTOSCOPY WITH RETROGRADE PYELOGRAM;  Surgeon: Abbie Sons, MD;  Location: [facility];  Service: Urology;  Laterality: Right;   CYSTOSCOPY W/ URETERAL STENT PLACEMENT Right 12/28/2020   Procedure: CYSTOSCOPY WITH RETROGRADE PYELOGRAM/URETERAL STENT PLACEMENT;  Surgeon: Irine Seal, MD;  Location: [facility];  Service: Urology;  Laterality: Right;   CYSTOSCOPY WITH STENT PLACEMENT Right 01/07/2019   Procedure: CYSTOSCOPY WITH STENT  PLACEMENT;  Surgeon: Abbie Sons, MD;  Location: [facility];  Service: Urology;  Laterality: Right;   CYSTOSCOPY WITH URETEROSCOPY, STONE BASKETRY AND STENT PLACEMENT Right 01/29/2019   Procedure: CYSTOSCOPY/URETEROSCOPY/HOLMIUM LASER/STENT EXCHANGE;  Surgeon: Abbie Sons, MD;  Location: [facility];  Service: Urology;  Laterality: Right;   CYSTOSCOPY/URETEROSCOPY/HOLMIUM LASER/STENT PLACEMENT Right 02/05/2021   Procedure: CYSTOSCOPY/URETEROSCOPY/HOLMIUM LASER/STENT EXCHANGE;  Surgeon: Billey Co, MD;  Location: [facility];  Service: Urology;  Laterality: Right;   ESOPHAGOGASTRODUODENOSCOPY N/A 01/02/2021   Procedure: ESOPHAGOGASTRODUODENOSCOPY (EGD);  Surgeon: Toledo, Benay Pike, MD;  Location: [facility];  Service: Gastroenterology;  Laterality: N/A;   JOINT REPLACEMENT      Current Medications: No outpatient medications have been marked as taking for the 11/01/22 encounter (Office Visit) with Kathlen Mody, Kevon Tench H, PA-C.     Allergies:   Other   Social History   Socioeconomic History   Marital status: Divorced    Spouse name: Not on file   Number of children: Not on file   Years of education: Not on file   Highest education level: Not on file  Occupational History   Not on file  Tobacco Use   Smoking status: Never   Smokeless tobacco: Never  Vaping Use   Vaping Use: Never used  Substance and Sexual Activity   Alcohol use: Yes    Comment: OCCAS   Drug use: Never   Sexual activity: Yes    Birth control/protection: None  Other Topics Concern   Not on file  Social History Narrative   Not on file   Social Determinants of Health   Financial Resource Strain: Not on file  Food Insecurity: Not on file  Transportation Needs: Not on file  Physical Activity: Not on file  Stress: Not on file  Social Connections: Not on file     Family History: The patient's family history includes Heart disease in her mother.  ROS:   Please see the history of present illness.     All  other systems reviewed and are negative.  EKGs/Labs/Other Studies Reviewed:    The following studies were reviewed today:  Echo 12/2020  1. Left ventricular ejection fraction, by estimation, is 55 to 60%. The  left ventricle has normal function. The left ventricle has no regional  wall motion abnormalities. There is mild left ventricular hypertrophy.  Left ventricular diastolic function  could not be evaluated.   2. Right ventricular systolic function is normal. The right ventricular  size is normal. There is normal pulmonary artery systolic pressure.   3. Left atrial size was moderately dilated.   4. The mitral valve is normal in structure. Mild to moderate mitral valve  regurgitation. No evidence of mitral stenosis.   5. The aortic valve is normal in structure. Aortic valve regurgitation is  mild. Mild aortic valve sclerosis is present, with no evidence of aortic  valve stenosis.   6. Aortic dilatation noted. There is mild dilatation of the ascending  aorta, measuring 41 mm.   7. Only parasternal views.   EKG:  EKG is ordered today.  The ekg ordered  today demonstrates NSR 80bpm, no significant changes  Recent Labs: 04/26/2022: ALT 11; BUN 18; Creatinine, Ser 1.60; Hemoglobin 13.5; Platelets 331; Potassium 3.7; Sodium 146  Recent Lipid Panel    Component Value Date/Time   CHOL 248 (H) 04/26/2022 1601   TRIG 273 (H) 04/26/2022 1601   HDL 44 04/26/2022 1601   CHOLHDL 5.6 04/26/2022 1601   VLDL 55 (H) 04/26/2022 1601   LDLCALC 149 (H) 04/26/2022 1601   LDLDIRECT 156 (H) 04/26/2022 1601    Physical Exam:    VS:  BP (!) 160/84 (BP Location: Left Arm, Patient Position: Sitting, Cuff Size: Large)   Pulse 80   Ht 5\' 7"  (1.702 m)   Wt 213 lb 4 oz (96.7 kg)   SpO2 98%   BMI 33.40 kg/m     Wt Readings from Last 3 Encounters:  11/01/22 213 lb 4 oz (96.7 kg)  04/26/22 202 lb (91.6 kg)  04/08/21 200 lb 2 oz (90.8 kg)     GEN:  Well nourished, well developed in no acute  distress HEENT: Normal NECK: No JVD; No carotid bruits LYMPHATICS: No lymphadenopathy CARDIAC: RRR, no murmurs, rubs, gallops RESPIRATORY:  Clear to auscultation without rales, wheezing or rhonchi  ABDOMEN: Soft, non-tender, non-distended MUSCULOSKELETAL:  No edema; No deformity  SKIN: Warm and dry NEUROLOGIC:  Alert and oriented x 3 PSYCHIATRIC:  Normal affect   ASSESSMENT:    1. Paroxysmal atrial fibrillation (HCC)   2. Hyperlipidemia LDL goal <70   3. Coronary artery calcification   4. Essential hypertension   5. Uncontrolled type 2 diabetes mellitus with hyperglycemia, without long-term current use of insulin (HCC)    PLAN:    In order of problems listed above:  PAF Patient is in NSR on  EKG today. CHADSVASC at least 4.  She has not had any of her medications due to labels fading away. I will send in Eliquis 5mg  BID. She is not on rate control at baseline.  HTN BP is high, but she has not had her medications in 2 weeks. I will send in HCTZ 12.5mg  daily.   Coronary artery calcification Patient denies anginal symptoms. I will send in refills of Lipitor. No ASA given Eliquis. Prior stress test in 2022 showed no evidence of ischemia.  HLD LDL 156 in 04/2022. Refill Lipitor as above. Patient will need updated annual labs at follow-up.   Disposition: Follow up in 6 month(s) with MD/APP    Signed, Kaylea Mounsey Ninfa Meeker, PA-C  11/01/2022 10:45 AM    [facility]

## 2022-11-01 NOTE — Telephone Encounter (Signed)
Eliquis 5mg  refill request received. Patient is 72 years old, weight-96.7kg, Crea-1.60 on 04/26/22, Diagnosis-Afib, and last seen by Cadence Furth on 11/01/22. Dose is appropriate based on dosing criteria. Will send in refill to requested pharmacy.

## 2022-11-01 NOTE — Patient Instructions (Signed)
Medication Instructions:  Your physician recommends that you continue on your current medications as directed. Please refer to the Current Medication list given to you today.  *If you need a refill on your cardiac medications before your next appointment, please call your pharmacy*   Lab Work: - none ordered  If you have labs (blood work) drawn today and your tests are completely normal, you will receive your results only by: [place] (if you have MyChart) OR A paper copy in the mail If you have any lab test that is abnormal or we need to change your treatment, we will call you to review the results.   Testing/Procedures: - none ordered   Follow-Up: At [facility], you and your health needs are our priority.  As part of our continuing mission to provide you with exceptional heart care, we have created designated Provider Care Teams.  These Care Teams include your primary Cardiologist (physician) and Advanced Practice Providers (APPs -  Physician Assistants and Nurse Practitioners) who all work together to provide you with the care you need, when you need it.  We recommend signing up for the patient portal called "MyChart".  Sign up information is provided on this After Visit Summary.  MyChart is used to connect with patients for Virtual Visits (Telemedicine).  Patients are able to view lab/test results, encounter notes, upcoming appointments, etc.  Non-urgent messages can be sent to your provider as well.   To learn more about what you can do with MyChart, go to [email or web address].    Your next appointment:   6 month(s)  Provider:   You may see Kathlyn Sacramento, MD or one of the following Advanced Practice Providers on your designated Care Team:   Cadence Kathlen Mody, [place]  Other Instructions - None

## 2022-11-08 NOTE — Addendum Note (Signed)
Addended by: Britt Bottom on: 11/08/2022 09:04 AM   Modules accepted: Orders

## 2022-12-14 IMAGING — DX DG ABDOMEN 1V
2 series · 2 of 2 positions shown · non-contrast
Comparison: January 22, 2019 abdominal radiograph; CT abdomen and
pelvis December 28, 2020

CLINICAL DATA: Abdominal pain with nausea and vomiting

EXAM:
ABDOMEN - 1 VIEW

[abdomen supine (1 of 2)]
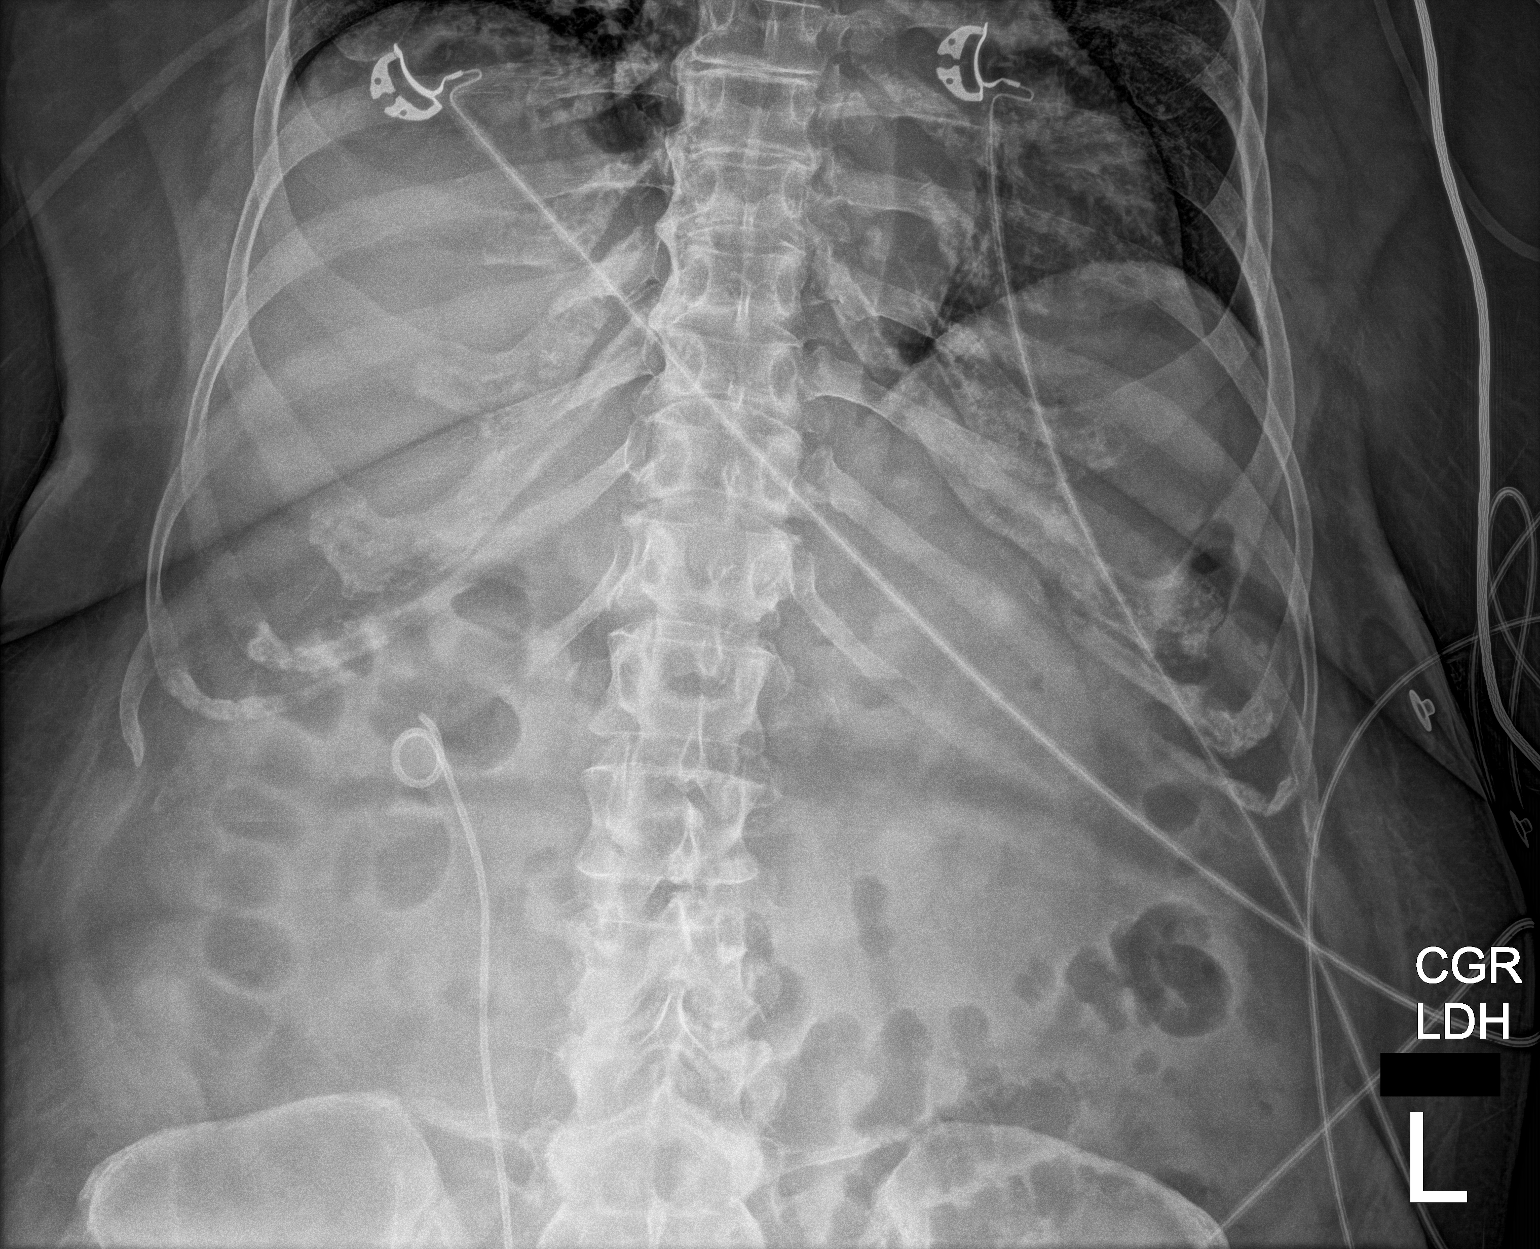

[abdomen supine (2 of 2)]
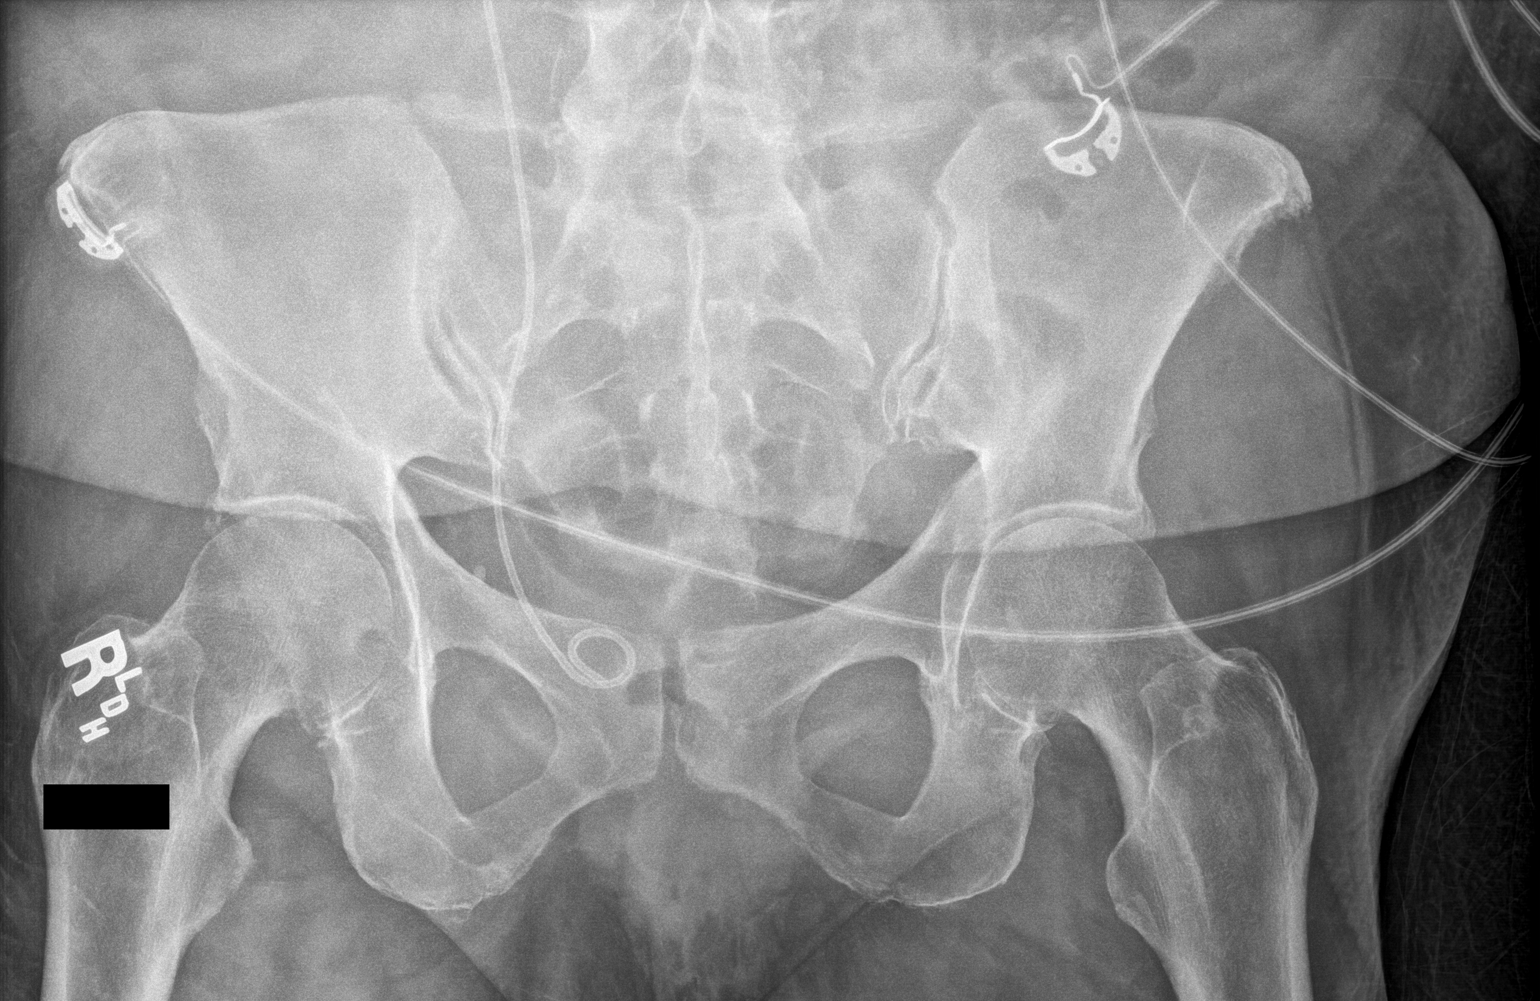

[2 of 2 positions shown; findings below may reference images not displayed]

FINDINGS: The previously noted calculus at the right ureteropelvic junction is
no longer evident. There is a double-J stent extending from the
expected location of the right renal pelvis to the bladder. There is
mild stool volume in the colon. There is no bowel dilatation or
air-fluid level to suggest bowel obstruction. No free air.
Visualized lung bases are clear.
IMPRESSION: Double-J stent now present on the right. Previously noted calculus
at the right ureteropelvic junction not seen currently.

No bowel obstruction or free air.  Visualized lung bases clear.

## 2023-01-05 IMAGING — CT CT RENAL STONE PROTOCOL
2 of 4 series · 16 of 46 positions shown, 18 images · non-contrast
Comparison: 12/28/2020

CLINICAL DATA: Flank pain.  Rule out kidney stone

EXAM:
CT ABDOMEN AND PELVIS WITHOUT CONTRAST
TECHNIQUE: Multidetector CT imaging of the abdomen and pelvis was performed
following the standard protocol without IV contrast.

[Series 2: stone full standard · axial · 0.73mm/px · z∈[-475,-50]mm · 13 of 95 slices shown, 15 images]
[im 5/95  soft-tissue]
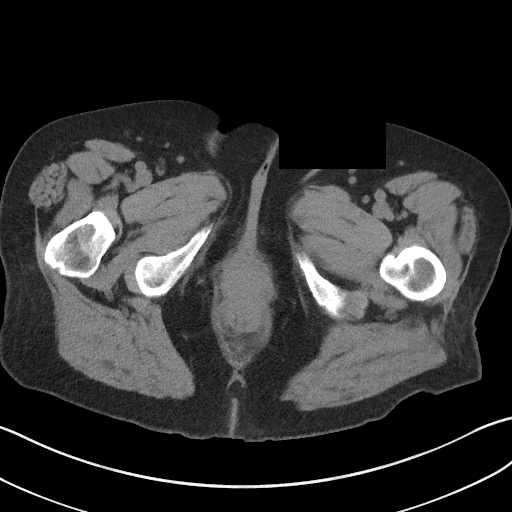
[im 5/95  bone]
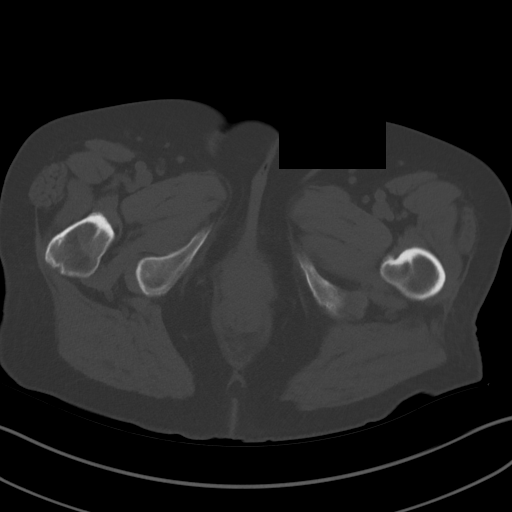
[im 13/95  soft-tissue]
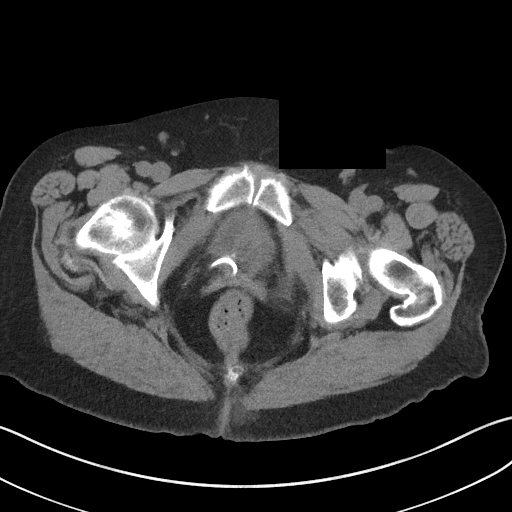
[im 21/95  soft-tissue]
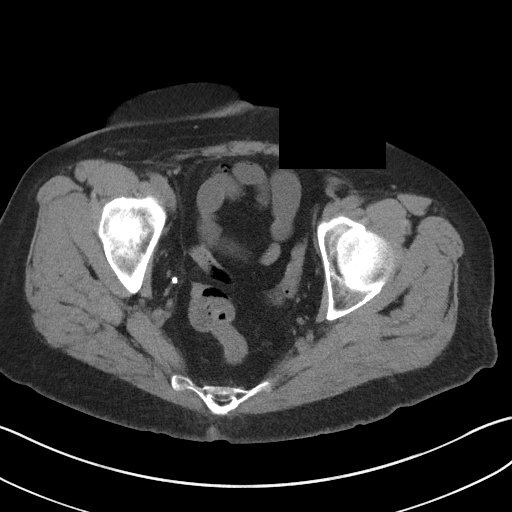
[im 25/95  soft-tissue]
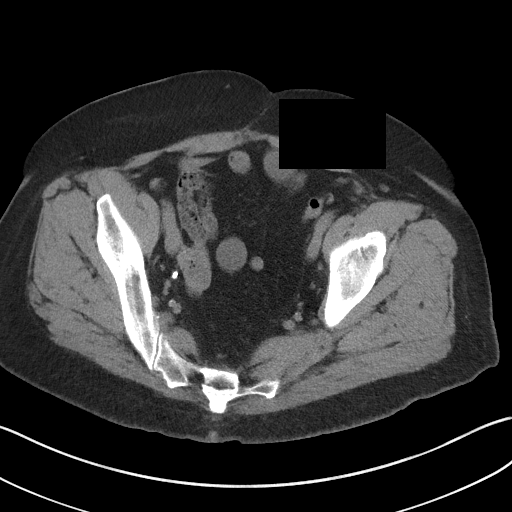
[im 33/95  soft-tissue]
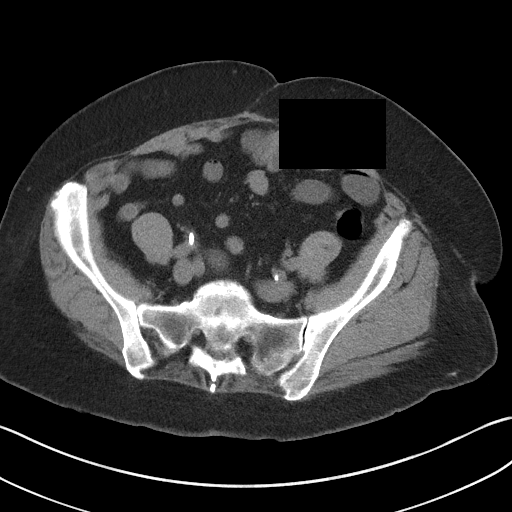
[im 41/95  soft-tissue]
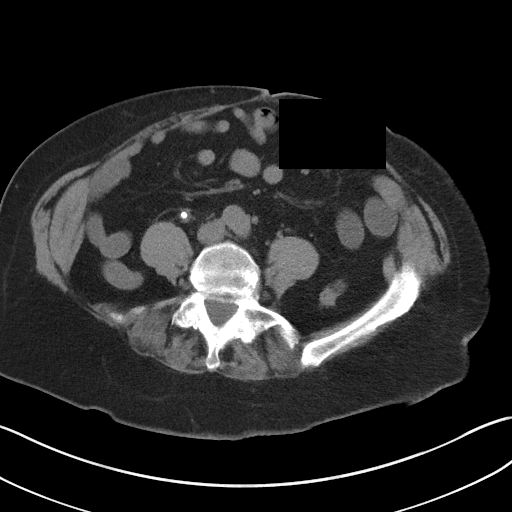
[im 50/95  soft-tissue]
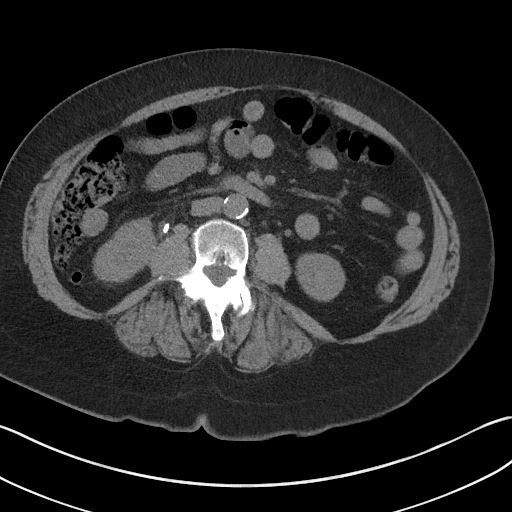
[im 54/95  soft-tissue]
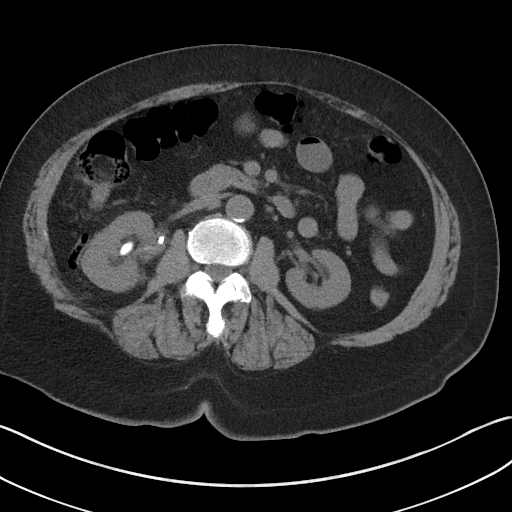
[im 62/95  soft-tissue]
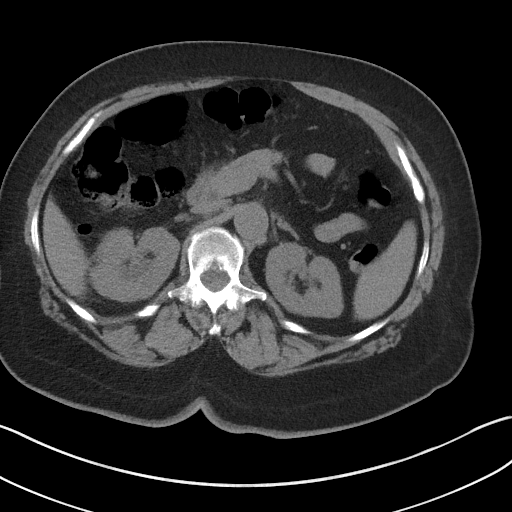
[im 62/95  bone]
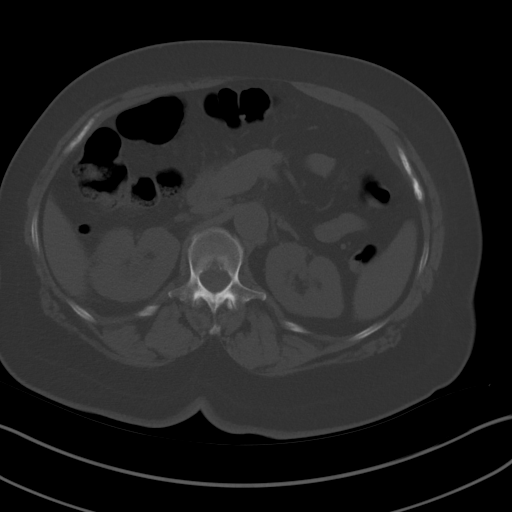
[im 70/95  soft-tissue]
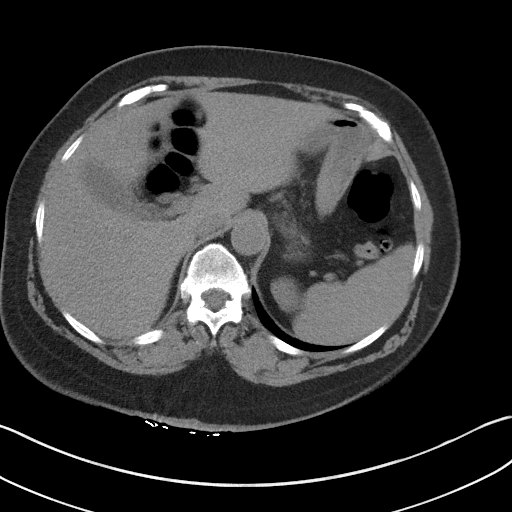
[im 74/95  soft-tissue]
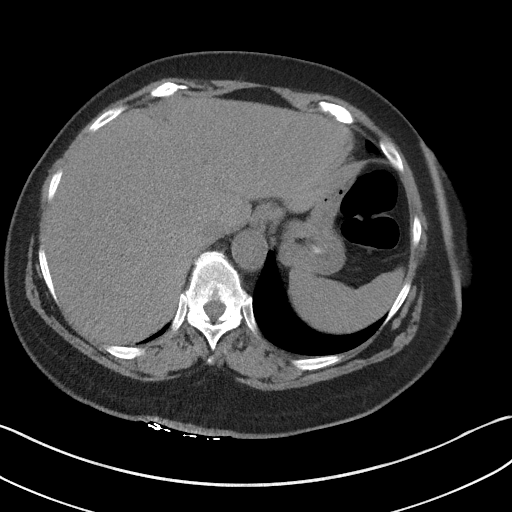
[im 82/95  soft-tissue]
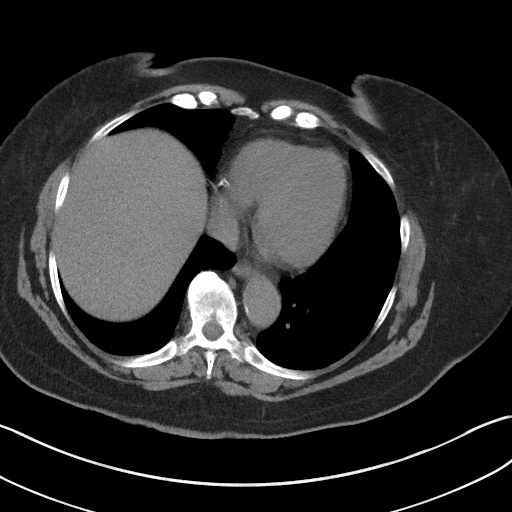
[im 90/95  soft-tissue]
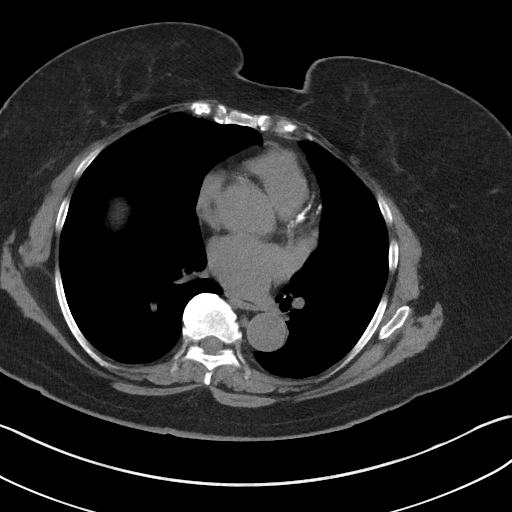

[Series 5: coronal · coronal · 0.74mm/px · 3 of 152 slices shown]
[im 51/152  soft-tissue]
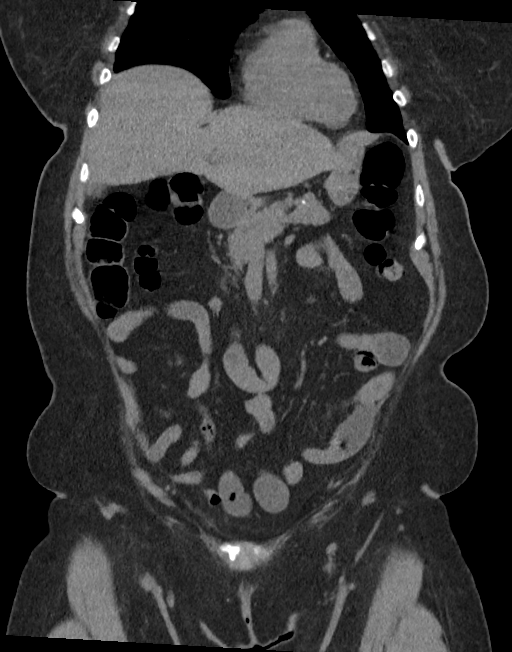
[im 68/152  soft-tissue]
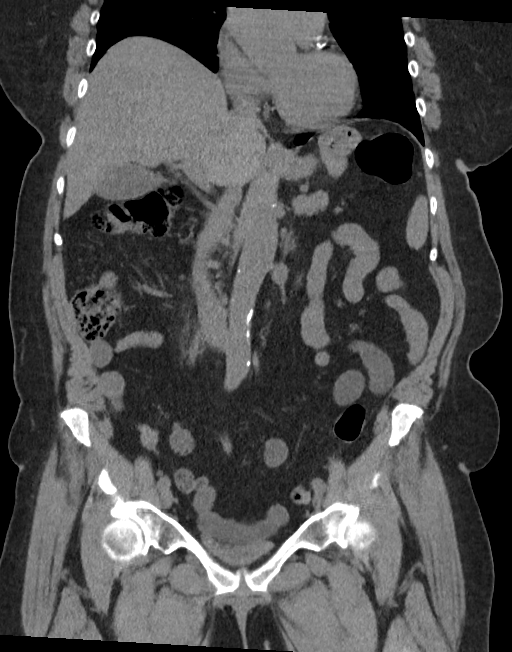
[im 84/152  soft-tissue]
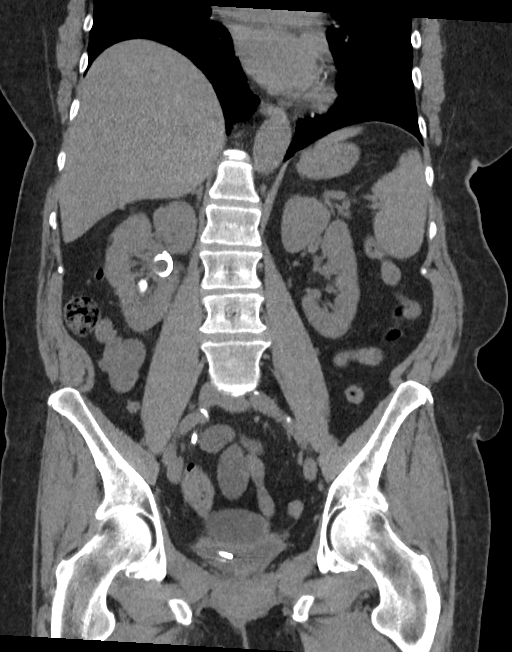

[16 of 46 positions shown; findings below may reference images not displayed]

FINDINGS: Lower chest: Coronary artery atherosclerotic calcifications. No
acute findings.

Hepatobiliary: No focal liver abnormality is seen. No gallstones,
gallbladder wall thickening, or biliary dilatation.

Pancreas: Unremarkable. No pancreatic ductal dilatation or
surrounding inflammatory changes.

Spleen: Normal in size without focal abnormality.

Adrenals/Urinary Tract: Normal adrenal glands.

Normal appearance of the left kidney. No left-sided nephrolithiasis,
hydronephrosis or hydroureter.

Retrograde migration of previous right UPJ calculus into the
inferior pole of the right kidney. A right-sided double-J
nephroureteral stent is been placed and appears to be in appropriate
position. Interval decompression of previous dilated right renal
collecting system. Urinary bladder appears normal.

Stomach/Bowel: Stomach is within normal limits. Appendix appears
normal. No evidence of bowel wall thickening, distention, or
inflammatory changes. A few scattered distal colonic diverticula
noted without signs of acute diverticulitis.

Vascular/Lymphatic: Aortic atherosclerosis. No enlarged abdominal or
pelvic lymph nodes.

Reproductive: Status post hysterectomy. No adnexal masses.

Other: There is no free fluid or fluid collections. No abdominal
wall hernia.

Musculoskeletal: No acute or significant osseous findings.
Degenerative disc disease noted T10-11 and T11-12.
IMPRESSION: 1. No acute findings within the abdomen or pelvis.
2. Right-sided double-J nephroureteral stent has been placed with
decompression of previous dilated right renal collecting system.
Retrograde migration of previous right UPJ calculus into the
inferior pole collecting system of the right kidney noted.
3. Coronary artery calcifications noted.
4. Aortic atherosclerosis.

Aortic Atherosclerosis ([id number]-J70.0).

## 2023-07-04 ENCOUNTER — Other Ambulatory Visit: Payer: Self-pay | Admitting: Internal Medicine

## 2023-07-04 DIAGNOSIS — Z1231 Encounter for screening mammogram for malignant neoplasm of breast: Secondary | ICD-10-CM

## 2023-07-07 ENCOUNTER — Ambulatory Visit
Admission: RE | Admit: 2023-07-07 | Discharge: 2023-07-07 | Disposition: A | Discharge status: Home / Self Care | Payer: 59 | Source: Ambulatory Visit | Attending: Internal Medicine | Admitting: Internal Medicine

## 2023-07-07 DIAGNOSIS — Z1231 Encounter for screening mammogram for malignant neoplasm of breast: Secondary | ICD-10-CM | POA: Diagnosis present

## 2023-08-24 ENCOUNTER — Other Ambulatory Visit: Payer: Self-pay | Admitting: Medical

## 2023-08-25 NOTE — Telephone Encounter (Signed)
Please schedule f/u appt.  Thanks!  last visit: 11/01/22 with plan to f/u in 6 months.  next visit:  none/active recall

## 2023-11-24 NOTE — Telephone Encounter (Signed)
 Left voicemail to schedule appointment

## 2024-03-31 ENCOUNTER — Emergency Department

## 2024-03-31 ENCOUNTER — Inpatient Hospital Stay: Admitting: Anesthesiology

## 2024-03-31 ENCOUNTER — Encounter
Admission: EM | Disposition: A | Discharge status: Skilled Nursing Facility | Payer: Self-pay | Source: Home / Self Care | Attending: Internal Medicine

## 2024-03-31 ENCOUNTER — Inpatient Hospital Stay
Admission: EM | Admit: 2024-03-31 | Discharge: 2024-04-06 | DRG: 854 | Disposition: A | Discharge status: Skilled Nursing Facility | Attending: Internal Medicine | Admitting: Internal Medicine

## 2024-03-31 ENCOUNTER — Other Ambulatory Visit: Payer: Self-pay

## 2024-03-31 ENCOUNTER — Inpatient Hospital Stay (HOSPITAL_COMMUNITY)
Admit: 2024-03-31 | Discharge: 2024-03-31 | Disposition: A | Discharge status: Home / Self Care | Attending: Internal Medicine

## 2024-03-31 DIAGNOSIS — Z7984 Long term (current) use of oral hypoglycemic drugs: Secondary | ICD-10-CM | POA: Diagnosis not present

## 2024-03-31 DIAGNOSIS — R7989 Other specified abnormal findings of blood chemistry: Secondary | ICD-10-CM | POA: Diagnosis not present

## 2024-03-31 DIAGNOSIS — Z7901 Long term (current) use of anticoagulants: Secondary | ICD-10-CM

## 2024-03-31 DIAGNOSIS — Z634 Disappearance and death of family member: Secondary | ICD-10-CM

## 2024-03-31 DIAGNOSIS — E785 Hyperlipidemia, unspecified: Secondary | ICD-10-CM | POA: Diagnosis present

## 2024-03-31 DIAGNOSIS — N139 Obstructive and reflux uropathy, unspecified: Secondary | ICD-10-CM | POA: Diagnosis present

## 2024-03-31 DIAGNOSIS — I480 Paroxysmal atrial fibrillation: Secondary | ICD-10-CM | POA: Diagnosis present

## 2024-03-31 DIAGNOSIS — G20A1 Parkinson's disease without dyskinesia, without mention of fluctuations: Secondary | ICD-10-CM | POA: Diagnosis present

## 2024-03-31 DIAGNOSIS — E872 Acidosis, unspecified: Secondary | ICD-10-CM | POA: Diagnosis present

## 2024-03-31 DIAGNOSIS — I129 Hypertensive chronic kidney disease with stage 1 through stage 4 chronic kidney disease, or unspecified chronic kidney disease: Secondary | ICD-10-CM | POA: Diagnosis present

## 2024-03-31 DIAGNOSIS — N1832 Chronic kidney disease, stage 3b: Secondary | ICD-10-CM | POA: Diagnosis present

## 2024-03-31 DIAGNOSIS — F4321 Adjustment disorder with depressed mood: Secondary | ICD-10-CM | POA: Diagnosis present

## 2024-03-31 DIAGNOSIS — Z1152 Encounter for screening for COVID-19: Secondary | ICD-10-CM | POA: Diagnosis not present

## 2024-03-31 DIAGNOSIS — I4891 Unspecified atrial fibrillation: Secondary | ICD-10-CM

## 2024-03-31 DIAGNOSIS — R531 Weakness: Principal | ICD-10-CM

## 2024-03-31 DIAGNOSIS — G4733 Obstructive sleep apnea (adult) (pediatric): Secondary | ICD-10-CM | POA: Diagnosis present

## 2024-03-31 DIAGNOSIS — R627 Adult failure to thrive: Secondary | ICD-10-CM | POA: Diagnosis present

## 2024-03-31 DIAGNOSIS — N200 Calculus of kidney: Secondary | ICD-10-CM

## 2024-03-31 DIAGNOSIS — K219 Gastro-esophageal reflux disease without esophagitis: Secondary | ICD-10-CM | POA: Diagnosis present

## 2024-03-31 DIAGNOSIS — E876 Hypokalemia: Secondary | ICD-10-CM | POA: Diagnosis present

## 2024-03-31 DIAGNOSIS — E1122 Type 2 diabetes mellitus with diabetic chronic kidney disease: Secondary | ICD-10-CM | POA: Diagnosis present

## 2024-03-31 DIAGNOSIS — G43909 Migraine, unspecified, not intractable, without status migrainosus: Secondary | ICD-10-CM | POA: Diagnosis not present

## 2024-03-31 DIAGNOSIS — N10 Acute tubulo-interstitial nephritis: Secondary | ICD-10-CM | POA: Diagnosis present

## 2024-03-31 DIAGNOSIS — Z6827 Body mass index (BMI) 27.0-27.9, adult: Secondary | ICD-10-CM

## 2024-03-31 DIAGNOSIS — A419 Sepsis, unspecified organism: Secondary | ICD-10-CM | POA: Diagnosis present

## 2024-03-31 DIAGNOSIS — R1084 Generalized abdominal pain: Secondary | ICD-10-CM

## 2024-03-31 DIAGNOSIS — N390 Urinary tract infection, site not specified: Secondary | ICD-10-CM | POA: Diagnosis present

## 2024-03-31 DIAGNOSIS — E1165 Type 2 diabetes mellitus with hyperglycemia: Secondary | ICD-10-CM | POA: Diagnosis present

## 2024-03-31 DIAGNOSIS — N136 Pyonephrosis: Secondary | ICD-10-CM | POA: Diagnosis present

## 2024-03-31 DIAGNOSIS — E663 Overweight: Secondary | ICD-10-CM | POA: Diagnosis present

## 2024-03-31 DIAGNOSIS — R6520 Severe sepsis without septic shock: Secondary | ICD-10-CM | POA: Diagnosis present

## 2024-03-31 DIAGNOSIS — A4151 Sepsis due to Escherichia coli [E. coli]: Principal | ICD-10-CM | POA: Diagnosis present

## 2024-03-31 DIAGNOSIS — N201 Calculus of ureter: Secondary | ICD-10-CM | POA: Diagnosis not present

## 2024-03-31 HISTORY — PX: CYSTOSCOPY W/ URETERAL STENT PLACEMENT: SHX1429

## 2024-03-31 LAB — URINALYSIS, W/ REFLEX TO CULTURE (INFECTION SUSPECTED)
Bilirubin Urine: NEGATIVE
Glucose, UA: >=500 mg/dL — AB
Ketones, ur: NEGATIVE mg/dL
Nitrite: NEGATIVE
Protein, ur: 100 mg/dL — AB
Specific Gravity, Urine: 1.024 (ref 1.005–1.030)
WBC, UA: >50 WBC/hpf (ref 0–5)
pH: 5 (ref 5.0–8.0)

## 2024-03-31 LAB — MAGNESIUM
Magnesium: 2 mg/dL (ref 1.7–2.4)
Magnesium: 1.9 mg/dL (ref 1.7–2.4)

## 2024-03-31 LAB — ECHOCARDIOGRAM COMPLETE
AR max vel: 2.04 cm2
AV Area VTI: 2.07 cm2
AV Area mean vel: 2.07 cm2
AV Mean grad: 5.3 mmHg
AV Peak grad: 10.1 mmHg
Ao pk vel: 1.59 m/s
Area-P 1/2: 8.29 cm2
Calc EF: 67.6 %
Height: 67 in
MV VTI: 4.59 cm2
P 1/2 time: 309 ms
S' Lateral: 3.3 cm
Single Plane A2C EF: 57.5 %
Single Plane A4C EF: 73.8 %
Weight: 2796.8 [oz_av]

## 2024-03-31 LAB — GLUCOSE, CAPILLARY
Glucose-Capillary: 304 mg/dL — ABNORMAL HIGH (ref 70–99)
Glucose-Capillary: 291 mg/dL — ABNORMAL HIGH (ref 70–99)

## 2024-03-31 LAB — BASIC METABOLIC PANEL WITH GFR
Anion gap: 11 (ref 5–15)
BUN: 38 mg/dL — ABNORMAL HIGH (ref 8–23)
CO2: 21 mmol/L — ABNORMAL LOW (ref 22–32)
Calcium: 7.8 mg/dL — ABNORMAL LOW (ref 8.9–10.3)
Chloride: 106 mmol/L (ref 98–111)
Creatinine, Ser: 1.98 mg/dL — ABNORMAL HIGH (ref 0.44–1.00)
GFR, Estimated: 26 mL/min — ABNORMAL LOW (ref >60)
Glucose, Bld: 209 mg/dL — ABNORMAL HIGH (ref 70–99)
Potassium: 3.2 mmol/L — ABNORMAL LOW (ref 3.5–5.1)
Sodium: 138 mmol/L (ref 135–145)

## 2024-03-31 LAB — CBC WITH DIFFERENTIAL/PLATELET
Abs Immature Granulocytes: 0.45 10*3/uL — ABNORMAL HIGH (ref 0.00–0.07)
Basophils Absolute: 0.1 10*3/uL (ref 0.0–0.1)
Basophils Relative: 1 %
Eosinophils Absolute: 0 10*3/uL (ref 0.0–0.5)
Eosinophils Relative: 0 %
HCT: 39.7 % (ref 36.0–46.0)
Hemoglobin: 13 g/dL (ref 12.0–15.0)
Immature Granulocytes: 3 %
Lymphocytes Relative: 4 %
Lymphs Abs: 0.7 10*3/uL (ref 0.7–4.0)
MCH: 26.3 pg (ref 26.0–34.0)
MCHC: 32.7 g/dL (ref 30.0–36.0)
MCV: 80.2 fL (ref 80.0–100.0)
Monocytes Absolute: 0.3 10*3/uL (ref 0.1–1.0)
Monocytes Relative: 2 %
Neutro Abs: 15.8 10*3/uL — ABNORMAL HIGH (ref 1.7–7.7)
Neutrophils Relative %: 90 %
Platelets: 161 10*3/uL (ref 150–400)
RBC: 4.95 MIL/uL (ref 3.87–5.11)
RDW: 15.9 % — ABNORMAL HIGH (ref 11.5–15.5)
Smear Review: NORMAL
WBC: 17.4 10*3/uL — ABNORMAL HIGH (ref 4.0–10.5)
nRBC: 0 % (ref 0.0–0.2)

## 2024-03-31 LAB — COMPREHENSIVE METABOLIC PANEL WITH GFR
ALT: 31 U/L (ref 0–44)
AST: 60 U/L — ABNORMAL HIGH (ref 15–41)
Albumin: 3.1 g/dL — ABNORMAL LOW (ref 3.5–5.0)
Alkaline Phosphatase: 72 U/L (ref 38–126)
Anion gap: 13 (ref 5–15)
BUN: 48 mg/dL — ABNORMAL HIGH (ref 8–23)
CO2: 22 mmol/L (ref 22–32)
Calcium: 8.3 mg/dL — ABNORMAL LOW (ref 8.9–10.3)
Chloride: 100 mmol/L (ref 98–111)
Creatinine, Ser: 2.64 mg/dL — ABNORMAL HIGH (ref 0.44–1.00)
GFR, Estimated: 19 mL/min — ABNORMAL LOW (ref >60)
Glucose, Bld: 359 mg/dL — ABNORMAL HIGH (ref 70–99)
Potassium: 2.9 mmol/L — ABNORMAL LOW (ref 3.5–5.1)
Sodium: 135 mmol/L (ref 135–145)
Total Bilirubin: 1 mg/dL (ref 0.0–1.2)
Total Protein: 6.7 g/dL (ref 6.5–8.1)

## 2024-03-31 LAB — HEMOGLOBIN A1C
Hgb A1c MFr Bld: 6.3 % — ABNORMAL HIGH (ref 4.8–5.6)
Mean Plasma Glucose: 134.11 mg/dL

## 2024-03-31 LAB — LACTIC ACID, PLASMA
Lactic Acid, Venous: 4.8 mmol/L (ref 0.5–1.9)
Lactic Acid, Venous: 4.9 mmol/L (ref 0.5–1.9)
Lactic Acid, Venous: 1.8 mmol/L (ref 0.5–1.9)
Lactic Acid, Venous: 2.5 mmol/L (ref 0.5–1.9)

## 2024-03-31 LAB — C-REACTIVE PROTEIN: CRP: 34.5 mg/dL — ABNORMAL HIGH (ref <1.0)

## 2024-03-31 LAB — RESP PANEL BY RT-PCR (RSV, FLU A&B, COVID)  RVPGX2
Influenza A by PCR: NEGATIVE
Influenza B by PCR: NEGATIVE
Resp Syncytial Virus by PCR: NEGATIVE
SARS Coronavirus 2 by RT PCR: NEGATIVE

## 2024-03-31 LAB — LIPASE, BLOOD: Lipase: 26 U/L (ref 11–51)

## 2024-03-31 LAB — CBG MONITORING, ED
Glucose-Capillary: 182 mg/dL — ABNORMAL HIGH (ref 70–99)
Glucose-Capillary: 231 mg/dL — ABNORMAL HIGH (ref 70–99)

## 2024-03-31 LAB — TROPONIN I (HIGH SENSITIVITY)
Troponin I (High Sensitivity): 88 ng/L — ABNORMAL HIGH (ref <18)
Troponin I (High Sensitivity): 50 ng/L — ABNORMAL HIGH (ref <18)

## 2024-03-31 SURGERY — CYSTOSCOPY, WITH RETROGRADE PYELOGRAM AND URETERAL STENT INSERTION
Anesthesia: General | Site: Ureter | Laterality: Right

## 2024-03-31 MED ORDER — FENTANYL CITRATE (PF) 100 MCG/2ML IJ SOLN: 25.0000 ug | INTRAMUSCULAR | Status: DC | PRN

## 2024-03-31 MED ORDER — OXYCODONE HCL 5 MG PO TABS: 5.0000 mg | ORAL_TABLET | Freq: Once | ORAL | Status: DC | PRN

## 2024-03-31 MED ORDER — FENTANYL CITRATE (PF) 100 MCG/2ML IJ SOLN
INTRAMUSCULAR | Status: DC | PRN
  Administered 2024-03-31: 25 ug via INTRAVENOUS

## 2024-03-31 MED ORDER — DEXAMETHASONE SODIUM PHOSPHATE 10 MG/ML IJ SOLN
INTRAMUSCULAR | Status: AC
  Filled 2024-03-31: qty 1

## 2024-03-31 MED ORDER — SODIUM CHLORIDE 0.9 % IV BOLUS
1000.0000 mL | Freq: Once | INTRAVENOUS | Status: AC
  Administered 2024-03-31: 1000 mL via INTRAVENOUS

## 2024-03-31 MED ORDER — LIDOCAINE HCL (CARDIAC) PF 100 MG/5ML IV SOSY
PREFILLED_SYRINGE | INTRAVENOUS | Status: DC | PRN
  Administered 2024-03-31: 100 mg via INTRAVENOUS

## 2024-03-31 MED ORDER — LIDOCAINE HCL (PF) 2 % IJ SOLN
INTRAMUSCULAR | Status: AC
Start: 2024-03-31 — End: 2024-03-31
  Filled 2024-03-31: qty 5

## 2024-03-31 MED ORDER — CHLORHEXIDINE GLUCONATE CLOTH 2 % EX PADS
6.0000 | MEDICATED_PAD | Freq: Every day | CUTANEOUS | Status: DC
  Administered 2024-03-31 – 2024-04-05 (×6): 6 via TOPICAL

## 2024-03-31 MED ORDER — PHENYLEPHRINE 80 MCG/ML (10ML) SYRINGE FOR IV PUSH (FOR BLOOD PRESSURE SUPPORT)
PREFILLED_SYRINGE | INTRAVENOUS | Status: DC | PRN
  Administered 2024-03-31 (×4): 160 ug via INTRAVENOUS

## 2024-03-31 MED ORDER — HEPARIN (PORCINE) 25000 UT/250ML-% IV SOLN
1600.0000 [IU]/h | INTRAVENOUS | Status: DC
  Administered 2024-03-31: 1100 [IU]/h via INTRAVENOUS
  Administered 2024-04-02: 1450 [IU]/h via INTRAVENOUS
  Filled 2024-03-31 (×3): qty 250

## 2024-03-31 MED ORDER — ONDANSETRON HCL 4 MG PO TABS: 4.0000 mg | ORAL_TABLET | Freq: Four times a day (QID) | ORAL | Status: DC | PRN

## 2024-03-31 MED ORDER — ATORVASTATIN CALCIUM 20 MG PO TABS
40.0000 mg | ORAL_TABLET | Freq: Every day | ORAL | Status: DC
  Administered 2024-03-31 – 2024-04-05 (×6): 40 mg via ORAL
  Filled 2024-03-31 (×7): qty 2

## 2024-03-31 MED ORDER — MAGNESIUM SULFATE 2 GM/50ML IV SOLN
2.0000 g | Freq: Once | INTRAVENOUS | Status: AC
  Administered 2024-03-31: 2 g via INTRAVENOUS
  Filled 2024-03-31: qty 50

## 2024-03-31 MED ORDER — PROPOFOL 10 MG/ML IV BOLUS
INTRAVENOUS | Status: DC | PRN
  Administered 2024-03-31: 150 mg via INTRAVENOUS

## 2024-03-31 MED ORDER — DILTIAZEM HCL 25 MG/5ML IV SOLN
10.0000 mg | Freq: Once | INTRAVENOUS | Status: AC
  Administered 2024-03-31: 10 mg via INTRAVENOUS
  Filled 2024-03-31: qty 5

## 2024-03-31 MED ORDER — METOPROLOL TARTRATE 5 MG/5ML IV SOLN: 5.0000 mg | Freq: Once | INTRAVENOUS | Status: DC

## 2024-03-31 MED ORDER — DIGOXIN 0.25 MG/ML IJ SOLN: 0.5000 mg | Freq: Once | INTRAMUSCULAR | Status: DC

## 2024-03-31 MED ORDER — AMIODARONE HCL IN DEXTROSE 360-4.14 MG/200ML-% IV SOLN
60.0000 mg/h | INTRAVENOUS | Status: AC
  Administered 2024-03-31: 60 mg/h via INTRAVENOUS
  Filled 2024-03-31 (×2): qty 200

## 2024-03-31 MED ORDER — POTASSIUM CHLORIDE 10 MEQ/100ML IV SOLN
10.0000 meq | INTRAVENOUS | Status: AC
  Administered 2024-03-31 (×4): 10 meq via INTRAVENOUS
  Filled 2024-03-31 (×4): qty 100

## 2024-03-31 MED ORDER — METOPROLOL SUCCINATE ER 50 MG PO TB24: 100.0000 mg | ORAL_TABLET | Freq: Every day | ORAL | Status: DC

## 2024-03-31 MED ORDER — LACTATED RINGERS IV BOLUS
500.0000 mL | Freq: Once | INTRAVENOUS | Status: AC
  Administered 2024-03-31: 500 mL via INTRAVENOUS

## 2024-03-31 MED ORDER — DILTIAZEM HCL-DEXTROSE 125-5 MG/125ML-% IV SOLN (PREMIX)
5.0000 mg/h | INTRAVENOUS | Status: DC
  Administered 2024-03-31: 5 mg/h via INTRAVENOUS
  Filled 2024-03-31: qty 125

## 2024-03-31 MED ORDER — LACTATED RINGERS IV BOLUS
1000.0000 mL | Freq: Once | INTRAVENOUS | Status: AC
  Administered 2024-03-31: 1000 mL via INTRAVENOUS

## 2024-03-31 MED ORDER — METRONIDAZOLE 500 MG/100ML IV SOLN
500.0000 mg | Freq: Once | INTRAVENOUS | Status: AC
  Administered 2024-03-31: 500 mg via INTRAVENOUS
  Filled 2024-03-31: qty 100

## 2024-03-31 MED ORDER — ONDANSETRON HCL 4 MG/2ML IJ SOLN
INTRAMUSCULAR | Status: DC | PRN
  Administered 2024-03-31: 4 mg via INTRAVENOUS

## 2024-03-31 MED ORDER — HEPARIN BOLUS VIA INFUSION
4000.0000 [IU] | Freq: Once | INTRAVENOUS | Status: AC
  Administered 2024-03-31: 4000 [IU] via INTRAVENOUS
  Filled 2024-03-31: qty 4000

## 2024-03-31 MED ORDER — IOHEXOL 180 MG/ML  SOLN
INTRAMUSCULAR | Status: DC | PRN
  Administered 2024-03-31: 20 mL

## 2024-03-31 MED ORDER — FENTANYL CITRATE PF 50 MCG/ML IJ SOSY
12.5000 ug | PREFILLED_SYRINGE | INTRAMUSCULAR | Status: DC | PRN
  Administered 2024-03-31 – 2024-04-03 (×8): 12.5 ug via INTRAVENOUS
  Filled 2024-03-31 (×8): qty 1

## 2024-03-31 MED ORDER — FENTANYL CITRATE (PF) 100 MCG/2ML IJ SOLN
INTRAMUSCULAR | Status: AC
  Filled 2024-03-31: qty 2

## 2024-03-31 MED ORDER — SODIUM CHLORIDE 0.9 % IV SOLN
2.0000 g | INTRAVENOUS | Status: DC
  Administered 2024-04-01: 2 g via INTRAVENOUS
  Filled 2024-03-31: qty 12.5

## 2024-03-31 MED ORDER — ONDANSETRON HCL 4 MG/2ML IJ SOLN
INTRAMUSCULAR | Status: AC
  Filled 2024-03-31: qty 2

## 2024-03-31 MED ORDER — POTASSIUM CHLORIDE CRYS ER 20 MEQ PO TBCR
40.0000 meq | EXTENDED_RELEASE_TABLET | Freq: Once | ORAL | Status: AC
  Administered 2024-03-31: 40 meq via ORAL
  Filled 2024-03-31: qty 2

## 2024-03-31 MED ORDER — SODIUM CHLORIDE 0.9 % IV SOLN: INTRAVENOUS | Status: DC | PRN

## 2024-03-31 MED ORDER — CALCIUM GLUCONATE-NACL 1-0.675 GM/50ML-% IV SOLN
1.0000 g | Freq: Once | INTRAVENOUS | Status: AC
  Administered 2024-03-31: 1000 mg via INTRAVENOUS
  Filled 2024-03-31: qty 50

## 2024-03-31 MED ORDER — DEXAMETHASONE SODIUM PHOSPHATE 10 MG/ML IJ SOLN
INTRAMUSCULAR | Status: DC | PRN
  Administered 2024-03-31: 10 mg via INTRAVENOUS

## 2024-03-31 MED ORDER — AMIODARONE LOAD VIA INFUSION
150.0000 mg | Freq: Once | INTRAVENOUS | Status: AC
  Administered 2024-03-31: 150 mg via INTRAVENOUS
  Filled 2024-03-31: qty 83.34

## 2024-03-31 MED ORDER — ONDANSETRON HCL 4 MG/2ML IJ SOLN: 4.0000 mg | Freq: Four times a day (QID) | INTRAMUSCULAR | Status: DC | PRN

## 2024-03-31 MED ORDER — VANCOMYCIN HCL IN DEXTROSE 1-5 GM/200ML-% IV SOLN
1000.0000 mg | Freq: Once | INTRAVENOUS | Status: AC
  Administered 2024-03-31: 1000 mg via INTRAVENOUS
  Filled 2024-03-31: qty 200

## 2024-03-31 MED ORDER — OXYCODONE HCL 5 MG/5ML PO SOLN: 5.0000 mg | Freq: Once | ORAL | Status: DC | PRN

## 2024-03-31 MED ORDER — METOPROLOL TARTRATE 25 MG PO TABS
25.0000 mg | ORAL_TABLET | Freq: Four times a day (QID) | ORAL | Status: DC
  Administered 2024-03-31 – 2024-04-01 (×6): 25 mg via ORAL
  Filled 2024-03-31 (×7): qty 1

## 2024-03-31 MED ORDER — INSULIN ASPART 100 UNIT/ML IJ SOLN
0.0000 [IU] | Freq: Three times a day (TID) | INTRAMUSCULAR | Status: DC
  Administered 2024-03-31: 7 [IU] via SUBCUTANEOUS
  Administered 2024-03-31: 2 [IU] via SUBCUTANEOUS
  Administered 2024-04-01: 3 [IU] via SUBCUTANEOUS
  Administered 2024-04-01: 5 [IU] via SUBCUTANEOUS
  Administered 2024-04-01 – 2024-04-02 (×2): 3 [IU] via SUBCUTANEOUS
  Administered 2024-04-02 (×2): 2 [IU] via SUBCUTANEOUS
  Administered 2024-04-03: 3 [IU] via SUBCUTANEOUS
  Administered 2024-04-03: 1 [IU] via SUBCUTANEOUS
  Administered 2024-04-03: 2 [IU] via SUBCUTANEOUS
  Administered 2024-04-04: 1 [IU] via SUBCUTANEOUS
  Administered 2024-04-04 – 2024-04-05 (×3): 2 [IU] via SUBCUTANEOUS
  Administered 2024-04-05: 1 [IU] via SUBCUTANEOUS
  Administered 2024-04-05 – 2024-04-06 (×2): 2 [IU] via SUBCUTANEOUS
  Administered 2024-04-06: 1 [IU] via SUBCUTANEOUS
  Filled 2024-03-31 (×19): qty 1

## 2024-03-31 MED ORDER — AMIODARONE HCL IN DEXTROSE 360-4.14 MG/200ML-% IV SOLN
30.0000 mg/h | INTRAVENOUS | Status: DC
  Administered 2024-03-31 – 2024-04-02 (×4): 30 mg/h via INTRAVENOUS
  Filled 2024-03-31 (×3): qty 200

## 2024-03-31 MED ORDER — SODIUM CHLORIDE 0.9 % IV SOLN
2.0000 g | Freq: Once | INTRAVENOUS | Status: AC
  Administered 2024-03-31: 2 g via INTRAVENOUS
  Filled 2024-03-31: qty 12.5

## 2024-03-31 MED ORDER — ALBUTEROL SULFATE (2.5 MG/3ML) 0.083% IN NEBU: 2.5000 mg | INHALATION_SOLUTION | RESPIRATORY_TRACT | Status: DC | PRN

## 2024-03-31 SURGICAL SUPPLY — 25 items
BAG DRAIN SIEMENS DORNER NS (MISCELLANEOUS) ×1 IMPLANT
BAG URINE DRAIN 2000ML AR STRL (UROLOGICAL SUPPLIES) IMPLANT
CATH URETL OPEN 5X70 (CATHETERS) IMPLANT
CATH URETL OPEN END 6X70 (CATHETERS) IMPLANT
CATH URTH 16FR FL 2W BLN LF (CATHETERS) IMPLANT
GAUZE 4X4 16PLY ~~LOC~~+RFID DBL (SPONGE) ×2 IMPLANT
GLOVE BIO SURGEON STRL SZ7 (GLOVE) ×1 IMPLANT
GLOVE BIO SURGEON STRL SZ7.5 (GLOVE) ×1 IMPLANT
GOWN STRL REUS W/ TWL LRG LVL3 (GOWN DISPOSABLE) ×2 IMPLANT
GOWN STRL REUS W/ TWL XL LVL3 (GOWN DISPOSABLE) ×1 IMPLANT
GUIDEWIRE STR DUAL SENSOR (WIRE) ×1 IMPLANT
HOLDER FOLEY CATH W/STRAP (MISCELLANEOUS) IMPLANT
KIT TURNOVER CYSTO (KITS) ×1 IMPLANT
MANIFOLD NEPTUNE II (INSTRUMENTS) ×1 IMPLANT
PACK CYSTO AR (MISCELLANEOUS) ×1 IMPLANT
SET CYSTO W/LG BORE CLAMP LF (SET/KITS/TRAYS/PACK) IMPLANT
SOL .9 NS 3000ML IRR UROMATIC (IV SOLUTION) ×1 IMPLANT
STENT URET 6FRX24 CONTOUR (STENTS) IMPLANT
SURGILUBE 2OZ TUBE FLIPTOP (MISCELLANEOUS) ×1 IMPLANT
SYR 10ML LL (SYRINGE) ×1 IMPLANT
SYRINGE TOOMEY IRRIG 70ML (MISCELLANEOUS) ×1 IMPLANT
TRAP FLUID SMOKE EVACUATOR (MISCELLANEOUS) ×1 IMPLANT
TUBE NG 8FR 23IN ENFIT (TUBING) IMPLANT
WATER STERILE IRR 1000ML POUR (IV SOLUTION) ×1 IMPLANT
WATER STERILE IRR 500ML POUR (IV SOLUTION) ×1 IMPLANT

## 2024-03-31 NOTE — Op Note (Signed)
 Cynthia Lucas, LEVEN MEDICAL RECORD NO: [id number] ACCOUNT NO: [id number] DATE OF BIRTH: 1950-12-22 FACILITY: [facility] LOCATION: [facility] PHYSICIAN: Ricardo Likens, MD  Operative Report   PREOPERATIVE DIAGNOSIS:  Right ureteral stone and sepsis.  PROCEDURE PERFORMED: 1.  Cystoscopy with right retrograde pyelogram interpretation. 2.  Right ureteral stent placement.  ESTIMATED BLOOD LOSS:  Nil.  COMPLICATIONS:  None.  SPECIMENS:  None.  FINDINGS: 1.  Mild right hydronephrosis. 2.  Distal filling defect in the right ureter. 3.  Successful placement of right ureteral stent.  DRAINS:  Foley catheter to straight drain.  INDICATIONS:  The patient is a 73 year old lady with a history of atrial fibrillation, chronic severe tremor, and recent severe depression, who was found on workup of malaise to have a picture of sepsis with elevated lactate, white count, tachycardia,  bacteruria, and imaging with a right distal ureteral stone. Overall picture is certainly concerning for obstructing urosepsis.  Options were discussed including recommended path of emergent decompression with stenting.  She wished to proceed.  Informed  consent was obtained and placed in medical record.  DESCRIPTION OF PROCEDURE:  The patient being Anam Bobby verified and procedure being right ureteral stent placement was confirmed.  Procedure timeout was performed.  Intravenous antibiotics administered.  General anesthesia was introduced. The patient  was placed into a low lithotomy position.  Sterile field was created, prepping and draping the patient's vagina, introitus, and proximal thighs. Cystourethroscopy was performed using 21-French rigid cystoscope with offset lens.  Inspection of urinary  bladder proteinaceous-appearing urine.  The ureteral orifice appeared single.  The right ureteral orifice was cannulated with a 6-French open-ended catheter, and right retrograde pyelogram was obtained.  Right retrograde  pyelogram demonstrated single right ureter, single system right kidney.  There was a filling defect in the distal ureter consistent with known stone with mild hydronephrosis above this.  A 0.038 sensor wire was advanced to the level of  the kidney, over which a new 6 x 24 contour type stent was carefully placed.  Good proximal and distal planes were noted.  A 16-French Foley catheter was placed per urethra to straight drain, and the procedure was terminated.  The patient tolerated the  procedure well.  No immediate periprocedural complications.  The patient was taken to the post-anesthesia care unit in critical condition.  Plan for ICU admission.   NIK D: 03/31/2024 10:25:00 am T: 03/31/2024 10:59:00 pm  JOB: [id number]/ [id number]

## 2024-03-31 NOTE — ED Triage Notes (Addendum)
 Arrives via EMS from home for severe depression EMS assessment: Poor appetite and sleeping a lot  EMS vitals:123/76, HR 110, CBG: 248, 100% RA, 12 lead WNL [place] and has been bedbound, and chair bound and has been in severe depression since she lost her son Lives w/sister who states can't take care of her anymore, sister states Pt needs help. PtHx: Eye surgery Tuesday  Pt tells this RN her stomach hurts

## 2024-03-31 NOTE — ED Notes (Addendum)
 Pt to CT when this RN went to check on her, room empty.

## 2024-03-31 NOTE — Anesthesia Preprocedure Evaluation (Signed)
 Anesthesia Evaluation  Patient identified by MRN, date of birth, ID band Patient awake    Reviewed: Allergy & Precautions, NPO status , Patient's Chart, lab work & pertinent test results  History of Anesthesia Complications Negative for: history of anesthetic complications  Airway Mallampati: I  TM Distance: >3 FB Neck ROM: full    Dental no notable dental hx.    Pulmonary sleep apnea     + decreased breath sounds      Cardiovascular hypertension, On Medications + dysrhythmias Atrial Fibrillation  Rhythm:Irregular Rate:Tachycardia     Neuro/Psych tremor  negative psych ROS   GI/Hepatic Neg liver ROS,GERD  Medicated,,  Endo/Other  diabetes, Type 2    Renal/GU Renal disease  negative genitourinary   Musculoskeletal   Abdominal  (+) + obese  Peds  Hematology negative hematology ROS (+)   Anesthesia Other Findings Past Medical History: No date: Atrial fibrillation (HCC) No date: Cough     Comment:  CHRONIC No date: Diabetes mellitus without complication (HCC) No date: GERD (gastroesophageal reflux disease) No date: History of kidney stones No date: Hypertension No date: Sleep apnea     Comment:  no cpap No date: Tremor     Comment:  HEAD  Past Surgical History: No date: ABDOMINAL HYSTERECTOMY 03/07/2018: CATARACT EXTRACTION W/PHACO; Left     Comment:  Procedure: CATARACT EXTRACTION PHACO AND INTRAOCULAR               LENS PLACEMENT (IOC);  Surgeon: Jaye Fallow, MD;                Location: [facility];  Service: Ophthalmology;  Laterality:              Left;  Lot # [id number] H US : 04:19.8 AP%: 19.9 CDE: 51.70 07/07/2020: CATARACT EXTRACTION W/PHACO; Right     Comment:  Procedure: CATARACT EXTRACTION PHACO AND INTRAOCULAR               LENS PLACEMENT (IOC) RIGHT DIABETIC 7.75 00:42.8;                Surgeon: Jaye Fallow, MD;  Location: [facility];  Service: Ophthalmology;   Laterality: Right;                diabetic - oral meds No date: CESAREAN SECTION No date: COLONOSCOPY 01/07/2019: CYSTOSCOPY W/ RETROGRADES; Right     Comment:  Procedure: CYSTOSCOPY WITH RETROGRADE PYELOGRAM;                Surgeon: Twylla Glendia BROCKS, MD;  Location: [facility];                Service: Urology;  Laterality: Right; 12/28/2020: CYSTOSCOPY W/ URETERAL STENT PLACEMENT; Right     Comment:  Procedure: CYSTOSCOPY WITH RETROGRADE PYELOGRAM/URETERAL              STENT PLACEMENT;  Surgeon: Watt Rush, MD;  Location:               [facility];  Service: Urology;  Laterality: Right; 01/07/2019: CYSTOSCOPY WITH STENT PLACEMENT; Right     Comment:  Procedure: CYSTOSCOPY WITH STENT PLACEMENT;  Surgeon:               Twylla Glendia BROCKS, MD;  Location: [facility];  Service:               Urology;  Laterality: Right; 01/29/2019: CYSTOSCOPY WITH URETEROSCOPY,  STONE BASKETRY AND STENT  PLACEMENT; Right     Comment:  Procedure: CYSTOSCOPY/URETEROSCOPY/HOLMIUM LASER/STENT               EXCHANGE;  Surgeon: Twylla Glendia BROCKS, MD;  Location: [facility]              [facility];  Service: Urology;  Laterality: Right; 02/05/2021: CYSTOSCOPY/URETEROSCOPY/HOLMIUM LASER/STENT PLACEMENT; Right     Comment:  Procedure: CYSTOSCOPY/URETEROSCOPY/HOLMIUM LASER/STENT               EXCHANGE;  Surgeon: Francisca Redell BROCKS, MD;  Location: [facility]              [facility];  Service: Urology;  Laterality: Right; 01/02/2021: ESOPHAGOGASTRODUODENOSCOPY; N/A     Comment:  Procedure: ESOPHAGOGASTRODUODENOSCOPY (EGD);  Surgeon:               Toledo, Ladell POUR, MD;  Location: [facility];                Service: Gastroenterology;  Laterality: N/A; No date: JOINT REPLACEMENT  BMI    Body Mass Index: 27.38 kg/m      Reproductive/Obstetrics negative OB ROS                             Anesthesia Physical Anesthesia Plan  ASA: 3 and emergent  Anesthesia Plan: General LMA   Post-op Pain Management: Ofirmev  IV (intra-op)*    Induction: Intravenous  PONV Risk Score and Plan: 3 and Dexamethasone , Ondansetron  and Treatment may vary due to age or medical condition  Airway Management Planned: LMA  Additional Equipment:   Intra-op Plan:   Post-operative Plan: Extubation in OR  Informed Consent: I have reviewed the patients History and Physical, chart, labs and discussed the procedure including the risks, benefits and alternatives for the proposed anesthesia with the patient or authorized representative who has indicated his/her understanding and acceptance.     Dental Advisory Given  Plan Discussed with: Anesthesiologist, CRNA and Surgeon  Anesthesia Plan Comments: (Patient consented for risks of anesthesia including but not limited to:  - adverse reactions to medications - damage to eyes, teeth, lips or other oral mucosa - nerve damage due to positioning  - sore throat or hoarseness - Damage to heart, brain, nerves, lungs, other parts of body or loss of life  Patient voiced understanding and assent.)        Anesthesia Quick Evaluation

## 2024-03-31 NOTE — Transfer of Care (Signed)
 Immediate Anesthesia Transfer of Care Note  [name]  Procedure(s) Performed: Procedure(s): CYSTOSCOPY, WITH RETROGRADE PYELOGRAM AND URETERAL STENT INSERTION (Right)  Patient Location: PACU  Anesthesia Type:General  Level of Consciousness: sedated  Airway & Oxygen Therapy: Patient Spontanous Breathing and Patient connected to face mask oxygen  Post-op Assessment: Report given to RN and Post -op Vital signs reviewed and stable  Post vital signs: Reviewed and stable  Last Vitals:  Vitals:   03/31/24 0930 03/31/24 1027  BP: 113/72 (!) 87/66  Pulse: (!) 128   Resp: (!) 22 (!) 21  Temp:    SpO2: 92% 99%    Complications: No apparent anesthesia complications

## 2024-03-31 NOTE — ED Provider Notes (Signed)
 [facility] Provider Note    Event Date/Time   First MD Initiated Contact with Patient 03/31/24 [phone]     (approximate)   History   Depression   HPI  Cynthia Lucas is a 73 y.o. female brought to the ED via EMS from home with a chief complaint of severe depression, decreased oral intake, increased sleeping.  [place] 2 [place] ago due to loss of a child.  Since then sister told EMS patient has been essentially bedbound due to grief, only going from bed to restroom.  Has seen a decline in patient's health.  Has addressed these issues with patient's PCP but feels nothing has been done.  Does not seem to have a precipitating factor tonight other than sister stated to EMS she cannot take care of patient anymore.  Patient endorses abdominal pain without nausea/vomiting/dysuria or diarrhea.  Denies fever/chills, chest pain, shortness of breath or dizziness.     Past Medical History   Past Medical History:  Diagnosis Date   Atrial fibrillation (HCC)    Cough    CHRONIC   Diabetes mellitus without complication (HCC)    GERD (gastroesophageal reflux disease)    History of kidney stones    Hypertension    Sleep apnea    no cpap   Tremor    HEAD     Active Problem List   Patient Active Problem List   Diagnosis Date Noted   Sepsis (HCC) 12/29/2020   Acute pyelonephritis 12/29/2020   Hydronephrosis of right kidney 12/29/2020   Kidney stone on right side 12/29/2020   Acute renal failure syndrome (HCC) 12/28/2020   Metabolic acidosis 12/28/2020   Hypokalemia 12/28/2020   Atrial fibrillation with rapid ventricular response (HCC) 12/28/2020   Uncontrolled type 2 diabetes mellitus with hyperglycemia, without long-term current use of insulin  (HCC) 12/28/2020   GI bleeding 12/27/2020   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   Obstructive uropathy 01/07/2019     Past Surgical History   Past Surgical History:  Procedure Laterality Date    ABDOMINAL HYSTERECTOMY     CATARACT EXTRACTION W/PHACO Left 03/07/2018   Procedure: CATARACT EXTRACTION PHACO AND INTRAOCULAR LENS PLACEMENT (IOC);  Surgeon: Jaye Fallow, MD;  Location: [facility];  Service: Ophthalmology;  Laterality: Left;  Lot # [id number] H US : 04:19.8 AP%: 19.9 CDE: 51.70   CATARACT EXTRACTION W/PHACO Right 07/07/2020   Procedure: CATARACT EXTRACTION PHACO AND INTRAOCULAR LENS PLACEMENT (IOC) RIGHT DIABETIC 7.75 00:42.8;  Surgeon: Jaye Fallow, MD;  Location: [facility];  Service: Ophthalmology;  Laterality: Right;  diabetic - oral meds   CESAREAN SECTION     COLONOSCOPY     CYSTOSCOPY W/ RETROGRADES Right 01/07/2019   Procedure: CYSTOSCOPY WITH RETROGRADE PYELOGRAM;  Surgeon: Twylla Glendia BROCKS, MD;  Location: [facility];  Service: Urology;  Laterality: Right;   CYSTOSCOPY W/ URETERAL STENT PLACEMENT Right 12/28/2020   Procedure: CYSTOSCOPY WITH RETROGRADE PYELOGRAM/URETERAL STENT PLACEMENT;  Surgeon: Watt Rush, MD;  Location: [facility];  Service: Urology;  Laterality: Right;   CYSTOSCOPY WITH STENT PLACEMENT Right 01/07/2019   Procedure: CYSTOSCOPY WITH STENT PLACEMENT;  Surgeon: Twylla Glendia BROCKS, MD;  Location: [facility];  Service: Urology;  Laterality: Right;   CYSTOSCOPY WITH URETEROSCOPY, STONE BASKETRY AND STENT PLACEMENT Right 01/29/2019   Procedure: CYSTOSCOPY/URETEROSCOPY/HOLMIUM LASER/STENT EXCHANGE;  Surgeon: Twylla Glendia BROCKS, MD;  Location: [facility];  Service: Urology;  Laterality: Right;   CYSTOSCOPY/URETEROSCOPY/HOLMIUM LASER/STENT PLACEMENT Right 02/05/2021   Procedure: CYSTOSCOPY/URETEROSCOPY/HOLMIUM LASER/STENT EXCHANGE;  Surgeon:  Francisca Redell BROCKS, MD;  Location: [facility];  Service: Urology;  Laterality: Right;   ESOPHAGOGASTRODUODENOSCOPY N/A 01/02/2021   Procedure: ESOPHAGOGASTRODUODENOSCOPY (EGD);  Surgeon: Toledo, Ladell POUR, MD;  Location: [facility];  Service: Gastroenterology;  Laterality: N/A;   JOINT REPLACEMENT       Home Medications    Prior to Admission medications   Medication Sig Start Date End Date Taking? Authorizing Provider  apixaban  (ELIQUIS ) 5 MG TABS tablet Take 1 tablet (5 mg total) by mouth 2 (two) times daily. Blood thinner. 11/01/22 04/30/23  Furth, Cadence H, PA-C  atorvastatin  (LIPITOR) 40 MG tablet Take 1 tablet (40 mg total) by mouth at bedtime. Overdue follow visit.  PLEASE CALL OFFICE TO SCHEDULE APPOINTMENT PRIOR TO NEXT REFILL 08/25/23   Darron Deatrice LABOR, MD  brimonidine -timolol  (COMBIGAN ) 0.2-0.5 % ophthalmic solution Place 1 drop into both eyes every 12 (twelve) hours. Patient not taking: Reported on 11/01/2022    [provider]  hydrochlorothiazide  (HYDRODIURIL ) 25 MG tablet Take 0.5 tablets (12.5 mg total) by mouth daily. 11/01/22   Furth, Cadence H, PA-C  JARDIANCE  25 MG TABS tablet Take 1 tablet (25 mg total) by mouth daily. Overdue follow visit.  PLEASE CALL OFFICE TO SCHEDULE APPOINTMENT PRIOR TO NEXT REFILL 08/25/23   Darron Deatrice LABOR, MD  LUMIGAN 0.01 % SOLN Place 1 drop into the left eye at bedtime.  Patient not taking: Reported on 11/01/2022 01/17/19   [provider]  tamsulosin  (FLOMAX ) 0.4 MG CAPS capsule Take 0.4 mg by mouth. Takes occasionally Patient not taking: Reported on 11/01/2022    [provider]     Allergies  Other   Family History   Family History  Problem Relation Age of Onset   Heart disease Mother    Breast cancer Neg Hx      Physical Exam  Triage Vital Signs: ED Triage Vitals [03/31/24 0614]  Encounter Vitals Group     BP      Girls Systolic BP Percentile      Girls Diastolic BP Percentile      Boys Systolic BP Percentile      Boys Diastolic BP Percentile      Pulse      Resp      Temp      Temp src      SpO2 100 %     Weight      Height      Head Circumference      Peak Flow      Pain Score      Pain Loc      Pain Education      Exclude from Growth Chart     Updated Vital Signs: BP 114/70   Pulse 100   Temp 98.7  F (37.1 C) (Oral)   Resp (!) 32   Ht 5' 7 (1.702 m)   Wt 79.3 kg   SpO2 98%   BMI 27.38 kg/m    General: Awake, mild distress.  CV:  Tachycardic, irregularly irregular rhythm. Good peripheral perfusion.  Resp:  Normal effort.  CTAB. Abd:  Mild diffuse tenderness to palpation without rebound or guarding.  No distention.  Other:  Baseline essential tremor per patient.   ED Results / Procedures / Treatments  Labs (all labs ordered are listed, but only abnormal results are displayed) Labs Reviewed  LACTIC ACID, PLASMA - Abnormal; Notable for the following components:      Result Value   Lactic Acid, Venous 4.8 (*)  All other components within normal limits  CBC WITH DIFFERENTIAL/PLATELET - Abnormal; Notable for the following components:   WBC 17.4 (*)    RDW 15.9 (*)    Neutro Abs 15.8 (*)    Abs Immature Granulocytes 0.45 (*)    All other components within normal limits  COMPREHENSIVE METABOLIC PANEL WITH GFR - Abnormal; Notable for the following components:   Potassium 2.9 (*)    Glucose, Bld 359 (*)    BUN 48 (*)    Creatinine, Ser 2.64 (*)    Calcium  8.3 (*)    Albumin 3.1 (*)    AST 60 (*)    GFR, Estimated 19 (*)    All other components within normal limits  TROPONIN I (HIGH SENSITIVITY) - Abnormal; Notable for the following components:   Troponin I (High Sensitivity) 88 (*)    All other components within normal limits  CULTURE, BLOOD (ROUTINE X 2)  CULTURE, BLOOD (ROUTINE X 2)  RESP PANEL BY RT-PCR (RSV, FLU A&B, COVID)  [id number]  LIPASE, BLOOD  LACTIC ACID, PLASMA  URINALYSIS, W/ REFLEX TO CULTURE (INFECTION SUSPECTED)     EKG  ED ECG REPORT I, Rochell Puett J, the attending physician, personally viewed and interpreted this ECG.   Date: 03/31/2024  EKG Time: 0621  Rate: 176  Rhythm: atrial fibrillation, rate 176  Axis: Normal  Intervals:none  ST&T Change: Nonspecific    RADIOLOGY  X-ray and CT scan pending.  Official radiology report(s): DG  Chest Port 1 View Result Date: 03/31/2024 CLINICAL DATA:  73 year old female with weakness, decreased p.o. EXAM: PORTABLE CHEST 1 VIEW COMPARISON:  Portable chest 12/27/2020. FINDINGS: Portable AP upright view at 0637 hours. Improved lung volumes. Mild elevation of the right hemidiaphragm is less pronounced, probably normal variant. Normal cardiac size and mediastinal contours. Visualized tracheal air column is within normal limits. Allowing for portable technique the lungs are clear. No pneumothorax or pleural effusion. Paucity of bowel gas in the upper abdomen. No acute osseous abnormality identified. IMPRESSION: No acute cardiopulmonary abnormality. Electronically Signed   By: VEAR Hurst M.D.   On: 03/31/2024 06:46     PROCEDURES:  Critical Care performed: Yes  CRITICAL CARE Performed by: ROBINETTE VERMELL PARAS   Total critical care time: 30 minutes  Critical care time was exclusive of separately billable procedures and treating other patients.  Critical care was necessary to treat or prevent imminent or life-threatening deterioration.  Critical care was time spent personally by me on the following activities: development of treatment plan with patient and/or surrogate as well as nursing, discussions with consultants, evaluation of patient's response to treatment, examination of patient, obtaining history from patient or surrogate, ordering and performing treatments and interventions, ordering and review of laboratory studies, ordering and review of radiographic studies, pulse oximetry and re-evaluation of patient's condition.   SABRA1-3 Lead EKG Interpretation  Performed by: ROBINETTE VERMELL PARAS, MD Authorized by: ROBINETTE VERMELL PARAS, MD     Interpretation: abnormal     ECG rate:  165   ECG rate assessment: tachycardic     Rhythm: atrial fibrillation     Ectopy: none     Conduction: normal   Comments:     Patient placed on cardiac monitor to evaluate for arrhythmias    MEDICATIONS ORDERED IN ED: Medications   calcium  gluconate 1 g/ 50 mL sodium chloride  IVPB (1,000 mg Intravenous New Bag/Given 03/31/24 0641)  diltiazem  (CARDIZEM ) 125 mg in dextrose  5% 125 mL (1 mg/mL) infusion (5 mg/hr Intravenous New Bag/Given  03/31/24 0654)  sodium chloride  0.9 % bolus 1,000 mL (1,000 mLs Intravenous New Bag/Given 03/31/24 0630)  diltiazem  (CARDIZEM ) injection 10 mg (10 mg Intravenous Given 03/31/24 0631)     IMPRESSION / MDM / ASSESSMENT AND PLAN / ED COURSE  I reviewed the triage vital signs and the nursing notes.                             73 year old female sent for generalized weakness, decreased oral intake, severe depression; endorses abdominal pain. Differential diagnosis includes, but is not limited to, ovarian cyst, ovarian torsion, acute appendicitis, diverticulitis, urinary tract infection/pyelonephritis, endometriosis, bowel obstruction, colitis, renal colic, gastroenteritis, hernia, fibroids, etc. 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  Patient's presentation is most consistent with acute complicated illness / injury requiring diagnostic workup.  The patient is on the cardiac monitor to evaluate for evidence of arrhythmia and/or significant heart rate changes.  Will obtain sepsis workup, troponin, chest x-ray, CT abdomen/pelvis.  Administer IV fluids.  Will reassess.  Clinical Course as of 03/31/24 0711  Sun Mar 31, 2024  0639 HR 143 after 10 mg IV Cardizem .  SBP 101; will administer calcium  gluconate and initiate IV Cardizem  infusion. [JS]  [id number] Care transferred to Dr. Willo at change of shift pending labs, CT and admission. [JS]    Clinical Course User Index [JS] Robinette Vermell PARAS, MD     FINAL CLINICAL IMPRESSION(S) / ED DIAGNOSES   Final diagnoses:  Generalized weakness  Generalized abdominal pain  Atrial fibrillation with rapid ventricular response (HCC)     Rx / DC Orders    ED Discharge Orders     None        Note:  This document was prepared using Dragon voice recognition software and may include unintentional dictation errors.   Robinette Vermell PARAS, MD 03/31/24 [phone]

## 2024-03-31 NOTE — ED Notes (Signed)
 Pt to OR, with OR Rns, belongings all with Pt.

## 2024-03-31 NOTE — Brief Op Note (Signed)
 03/31/2024  10:21 AM  PATIENT:  Cynthia Lucas  73 y.o. female  PRE-OPERATIVE DIAGNOSIS:  Right ureter stone  POST-OPERATIVE DIAGNOSIS:  Right ureter stone  PROCEDURE:  Procedure(s): CYSTOSCOPY, WITH RETROGRADE PYELOGRAM AND URETERAL STENT INSERTION (Right)  SURGEON:  Surgeons and Role:    * Manny, Ricardo KATHEE Raddle., MD - Primary  PHYSICIAN ASSISTANT:   ASSISTANTS: none   ANESTHESIA:   general  EBL:  minimal   BLOOD ADMINISTERED:none  DRAINS: 28F foley to gravity   LOCAL MEDICATIONS USED:  NONE  SPECIMEN:  No Specimen  DISPOSITION OF SPECIMEN:  N/A  COUNTS:  YES  TOURNIQUET:  * No tourniquets in log *  DICTATION: .Other Dictation: Dictation Number [id number]  PLAN OF CARE: Admit to inpatient   PATIENT DISPOSITION:  PACU - guarded condition.   Delay start of Pharmacological VTE agent (>24hrs) due to surgical blood loss or risk of bleeding: not applicable

## 2024-03-31 NOTE — Consult Note (Addendum)
 Cardiology Consultation   Patient ID: Cynthia Lucas MRN: [id number]; DOB: 1951-01-07  Admit date: 03/31/2024 Date of Consult: 03/31/2024  PCP:  Sampson Ethridge LABOR, MD   [facility] Providers Cardiologist:  Deatrice Cage, MD        Patient Profile: Cynthia Lucas is a 73 y.o. female with a hx of atrial fibrillation, who is being seen 03/31/2024 for the evaluation of A-fib RVR at the request of Dr. Debby.  History of Present Illness: [name] is a 73 year old female with history of paroxysmal atrial fibrillation, diabetes, hypertension, hyperlipidemia, OSA not tolerant to CPAP presenting to the hospital due to abdominal pain and poor p.o.  Intake.  Patient lost a child about 2 months ago leading to feeling depressed, poor p.o. intake, essentially bedbound as per family members.  Subsequently developed abdominal pain, sister called EMS as she could not take care of patient anymore.  In the ED, patient endorsed abdominal pain prompting workup with abdominal CT which revealed right renal stone.  UA was consistent with UTI, lab work revealed leukocytosis and lactate of 4.8.  EKG showed A-fib RVR heart rate 176.  She was given IV Cardizem  push, started on amiodarone due to low blood pressures.  Urology was emergently consulted, patient managed with antibiotics, taken to the OR where ureteral stent was placed.  Upon my exam, patient states abdominal pain has significantly improved almost resolved.  Telemetry shows atrial fibrillation with heart rate 130.   Past Medical History:  Diagnosis Date   Atrial fibrillation (HCC)    Cough    CHRONIC   Diabetes mellitus without complication (HCC)    GERD (gastroesophageal reflux disease)    History of kidney stones    Hypertension    Sleep apnea    no cpap   Tremor    HEAD    Past Surgical History:  Procedure Laterality Date   ABDOMINAL HYSTERECTOMY     CATARACT EXTRACTION W/PHACO Left 03/07/2018   Procedure: CATARACT  EXTRACTION PHACO AND INTRAOCULAR LENS PLACEMENT (IOC);  Surgeon: Jaye Fallow, MD;  Location: [facility];  Service: Ophthalmology;  Laterality: Left;  Lot # [id number] H US : 04:19.8 AP%: 19.9 CDE: 51.70   CATARACT EXTRACTION W/PHACO Right 07/07/2020   Procedure: CATARACT EXTRACTION PHACO AND INTRAOCULAR LENS PLACEMENT (IOC) RIGHT DIABETIC 7.75 00:42.8;  Surgeon: Jaye Fallow, MD;  Location: [facility];  Service: Ophthalmology;  Laterality: Right;  diabetic - oral meds   CESAREAN SECTION     COLONOSCOPY     CYSTOSCOPY W/ RETROGRADES Right 01/07/2019   Procedure: CYSTOSCOPY WITH RETROGRADE PYELOGRAM;  Surgeon: Twylla Glendia BROCKS, MD;  Location: [facility];  Service: Urology;  Laterality: Right;   CYSTOSCOPY W/ URETERAL STENT PLACEMENT Right 12/28/2020   Procedure: CYSTOSCOPY WITH RETROGRADE PYELOGRAM/URETERAL STENT PLACEMENT;  Surgeon: Watt Rush, MD;  Location: [facility];  Service: Urology;  Laterality: Right;   CYSTOSCOPY WITH STENT PLACEMENT Right 01/07/2019   Procedure: CYSTOSCOPY WITH STENT PLACEMENT;  Surgeon: Twylla Glendia BROCKS, MD;  Location: [facility];  Service: Urology;  Laterality: Right;   CYSTOSCOPY WITH URETEROSCOPY, STONE BASKETRY AND STENT PLACEMENT Right 01/29/2019   Procedure: CYSTOSCOPY/URETEROSCOPY/HOLMIUM LASER/STENT EXCHANGE;  Surgeon: Twylla Glendia BROCKS, MD;  Location: [facility];  Service: Urology;  Laterality: Right;   CYSTOSCOPY/URETEROSCOPY/HOLMIUM LASER/STENT PLACEMENT Right 02/05/2021   Procedure: CYSTOSCOPY/URETEROSCOPY/HOLMIUM LASER/STENT EXCHANGE;  Surgeon: Francisca Redell BROCKS, MD;  Location: [facility];  Service: Urology;  Laterality: Right;   ESOPHAGOGASTRODUODENOSCOPY N/A 01/02/2021   Procedure: ESOPHAGOGASTRODUODENOSCOPY (EGD);  Surgeon: Aundria, Teodoro K, MD;  Location: [facility];  Service: Gastroenterology;  Laterality: N/A;   JOINT REPLACEMENT       Home Medications:  Prior to Admission medications   Medication Sig Start Date End Date Taking? Authorizing Provider   erythromycin ophthalmic ointment Place 1 Application into the left eye 3 (three) times daily. 03/25/24  Yes [provider]  hydrochlorothiazide  (HYDRODIURIL ) 12.5 MG tablet Take 12.5 mg by mouth daily. 12/02/23  Yes [provider]  JARDIANCE  25 MG TABS tablet Take 1 tablet (25 mg total) by mouth daily. Overdue follow visit.  PLEASE CALL OFFICE TO SCHEDULE APPOINTMENT PRIOR TO NEXT REFILL 08/25/23  Yes Darron Deatrice LABOR, MD  metoprolol  succinate (TOPROL -XL) 25 MG 24 hr tablet Take 100 mg by mouth daily. 01/22/24  Yes [provider]  prednisoLONE acetate (PRED FORTE) 1 % ophthalmic suspension Place 1 drop into the left eye in the morning, at noon, in the evening, and at bedtime. 02/27/24  Yes [provider]  TRADJENTA 5 MG TABS tablet Take 5 mg by mouth daily. 01/22/24  Yes [provider]  amoxicillin-clavulanate (AUGMENTIN) 875-125 MG tablet Take 1 tablet by mouth 2 (two) times daily. Patient not taking: Reported on 03/31/2024 02/14/24   [provider]  apixaban  (ELIQUIS ) 5 MG TABS tablet Take 1 tablet (5 mg total) by mouth 2 (two) times daily. Blood thinner. 11/01/22 04/30/23  Furth, Cadence H, PA-C  atorvastatin  (LIPITOR) 40 MG tablet Take 1 tablet (40 mg total) by mouth at bedtime. Overdue follow visit.  PLEASE CALL OFFICE TO SCHEDULE APPOINTMENT PRIOR TO NEXT REFILL Patient not taking: Reported on 03/31/2024 08/25/23   Darron Deatrice LABOR, MD  atropine 1 % ophthalmic solution Place 1 drop into the left eye 3 (three) times daily. Patient not taking: Reported on 03/31/2024    [provider]  brimonidine -timolol  (COMBIGAN ) 0.2-0.5 % ophthalmic solution Place 1 drop into both eyes every 12 (twelve) hours. Patient not taking: Reported on 11/01/2022    [provider]  ciprofloxacin  (CILOXAN ) 0.3 % ophthalmic solution Place 2 drops into the left eye in the morning, at noon, in the evening, and at bedtime. Patient not taking: Reported on  03/31/2024 02/14/24   [provider]  hydrochlorothiazide  (HYDRODIURIL ) 25 MG tablet Take 0.5 tablets (12.5 mg total) by mouth daily. Patient not taking: Reported on 03/31/2024 11/01/22   Furth, Cadence H, PA-C  LUMIGAN 0.01 % SOLN Place 1 drop into the left eye at bedtime.  Patient not taking: Reported on 11/01/2022 01/17/19   [provider]  tamsulosin  (FLOMAX ) 0.4 MG CAPS capsule Take 0.4 mg by mouth. Takes occasionally Patient not taking: Reported on 11/01/2022    [provider]  terbinafine (LAMISIL) 250 MG tablet Take 250 mg by mouth daily. Patient not taking: Reported on 03/31/2024 01/22/24   [provider]    Scheduled Meds:  atorvastatin   40 mg Oral QHS   insulin  aspart  0-9 Units Subcutaneous TID WC   metoprolol  tartrate  25 mg Oral QID   Continuous Infusions:  amiodarone 60 mg/hr (03/31/24 1240)   Followed by   amiodarone     [START ON 04/01/2024] ceFEPime  (MAXIPIME ) IV     potassium chloride  10 mEq (03/31/24 1518)   PRN Meds: albuterol, fentaNYL  (SUBLIMAZE ) injection, ondansetron  **OR** ondansetron  (ZOFRAN ) IV  Allergies:    Allergies  Allergen Reactions   Nickel Itching and Swelling   Other     Contact lense solution causes eye irritation    Social History:  Social History   Socioeconomic History   Marital status: Divorced    Spouse name: Not on file   Number of children: Not on file   Years of education: Not on file   Highest education level: Not on file  Occupational History   Not on file  Tobacco Use   Smoking status: Never   Smokeless tobacco: Never  Vaping Use   Vaping status: Never Used  Substance and Sexual Activity   Alcohol use: Yes    Comment: OCCAS   Drug use: Never   Sexual activity: Yes    Birth control/protection: None  Other Topics Concern   Not on file  Social History Narrative   Not on file   Social Drivers of Health   Financial Resource Strain: Low Risk  (02/14/2024)   Received from [facility]   Overall Financial Resource Strain (CARDIA)    Difficulty of Paying Living Expenses: Not very hard  Food Insecurity: Patient Declined (03/31/2024)   Hunger Vital Sign    Worried About Running Out of Food in the Last Year: Patient declined    Ran Out of Food in the Last Year: Patient declined  Transportation Needs: Patient Declined (03/31/2024)   PRAPARE - Administrator, Civil Service (Medical): Patient declined    Lack of Transportation (Non-Medical): Patient declined  Physical Activity: Not on file  Stress: Not on file  Social Connections: Patient Declined (03/31/2024)   Social Connection and Isolation Panel    Frequency of Communication with Friends and Family: Patient declined    Frequency of Social Gatherings with Friends and Family: Patient declined    Attends Religious Services: Patient declined    Database administrator or Organizations: Patient declined    Attends Banker Meetings: Patient declined    Marital Status: Patient declined  Intimate Partner Violence: Patient Declined (03/31/2024)   Humiliation, Afraid, Rape, and Kick questionnaire    Fear of Current or Ex-Partner: Patient declined    Emotionally Abused: Patient declined    Physically Abused: Patient declined    Sexually Abused: Patient declined    Family History:    Family History  Problem Relation Age of Onset   Heart disease Mother    Breast cancer Neg Hx      ROS:  Please see the history of present illness.   All other ROS reviewed and negative.     Physical Exam/Data: Vitals:   03/31/24 1100 03/31/24 1200 03/31/24 1300 03/31/24 1400  BP: 121/74 105/61 137/84 122/77  Pulse: (!) 108 (!) 108 (!) 117 (!) 108  Resp: (!) 25 (!) 21 20 20   Temp:   98.9 F (37.2 C)   TempSrc:      SpO2: 100% 97% 98% 99%  Weight:      Height:        Intake/Output Summary (Last 24 hours) at 03/31/2024 1528 Last data filed at 03/31/2024 1348 Gross per 24 hour  Intake  4162.7 ml  Output 450 ml  Net 3712.7 ml      03/31/2024    6:15 AM 11/01/2022   10:18 AM 04/26/2022    3:13 PM  Last 3 Weights  Weight (lbs) 174 lb 12.8 oz 213 lb 4 oz 202 lb  Weight (kg) 79.289 kg 96.73 kg 91.627 kg     Body mass index is 27.38 kg/m.  General:  Well nourished, well developed, in no acute distress HEENT: normal Neck: no JVD Vascular: No carotid bruits; Distal  pulses 2+ bilaterally Cardiac: Irregular irregular Lungs:  clear to auscultation bilaterally, no wheezing, rhonchi or rales  Abd: soft, nontender, no hepatomegaly  Ext: no edema Musculoskeletal:  No deformities, BUE and BLE strength normal and equal Skin: warm and dry  Neuro:  CNs 2-12 intact, no focal abnormalities noted, essential tremors noted Psych:  Normal affect   EKG:  The EKG was personally reviewed and demonstrates: Atrial fibrillation heart rate 176 Telemetry:  Telemetry was personally reviewed and demonstrates: Atrial fibrillation heart rate 130  Relevant CV Studies: Echo 03/31/2024 EF 55 to 60%  Laboratory Data: High Sensitivity Troponin:   Recent Labs  Lab 03/31/24 0629 03/31/24 0815  TROPONINIHS 88* 50*     Chemistry Recent Labs  Lab 03/31/24 0629 03/31/24 1147  NA 135 138  K 2.9* 3.2*  CL 100 106  CO2 22 21*  GLUCOSE 359* 209*  BUN 48* 38*  CREATININE 2.64* 1.98*  CALCIUM  8.3* 7.8*  MG 2.0 1.9  GFRNONAA 19* 26*  ANIONGAP 13 11    Recent Labs  Lab 03/31/24 0629  PROT 6.7  ALBUMIN 3.1*  AST 60*  ALT 31  ALKPHOS 72  BILITOT 1.0   Lipids No results for input(s): CHOL, TRIG, HDL, LABVLDL, LDLCALC, CHOLHDL in the last 168 hours.  Hematology Recent Labs  Lab 03/31/24 0629  WBC 17.4*  RBC 4.95  HGB 13.0  HCT 39.7  MCV 80.2  MCH 26.3  MCHC 32.7  RDW 15.9*  PLT 161   Thyroid No results for input(s): TSH, FREET4 in the last 168 hours.  BNPNo results for input(s): BNP, PROBNP in the last 168 hours.  DDimer No results for input(s): DDIMER  in the last 168 hours.  Radiology/Studies:  ECHOCARDIOGRAM COMPLETE Result Date: 03/31/2024    ECHOCARDIOGRAM REPORT   Patient Name:   KYIESHA MILLWARD Date of Exam: 03/31/2024 Medical Rec #:  [id number]     Height:       67.0 in Accession #:    [id number]    Weight:       174.8 lb Date of Birth:  1950-12-24     BSA:          1.910 m Patient Age:    73 years      BP:           128/53 mmHg Patient Gender: F             HR:           113 bpm. Exam Location:  [facility] Procedure: 2D Echo, Color Doppler and Cardiac Doppler (Both Spectral and Color            Flow Doppler were utilized during procedure). Indications:     Elevated Troponin  History:         Patient has prior history of Echocardiogram examinations.                  Arrythmias:Atrial Fibrillation; Risk Factors:Diabetes and                  Hypertension.  Sonographer:     L. Thornton-Maynard Referring Phys:  [id number] CAMILA A THOMAS Diagnosing Phys: Redell Cave MD  Sonographer Comments: Suboptimal apical window. IMPRESSIONS  1. Left ventricular ejection fraction, by estimation, is 55 to 60%. The left ventricle has normal function. The left ventricle has no regional wall motion abnormalities. There is mild left ventricular hypertrophy. Left ventricular diastolic parameters are indeterminate.  2. Right ventricular systolic function is normal.  The right ventricular size is normal. There is normal pulmonary artery systolic pressure.  3. The mitral valve is normal in structure. Mild mitral valve regurgitation.  4. The aortic valve is tricuspid. Aortic valve regurgitation is mild.  5. Aortic dilatation noted. There is mild dilatation of the ascending aorta, measuring 44 mm.  6. The inferior vena cava is normal in size with greater than 50% respiratory variability, suggesting right atrial pressure of 3 mmHg. FINDINGS  Left Ventricle: Left ventricular ejection fraction, by estimation, is 55 to 60%. The left ventricle has normal function. The left ventricle has no  regional wall motion abnormalities. The left ventricular internal cavity size was normal in size. There is  mild left ventricular hypertrophy. Left ventricular diastolic parameters are indeterminate. Right Ventricle: The right ventricular size is normal. No increase in right ventricular wall thickness. Right ventricular systolic function is normal. There is normal pulmonary artery systolic pressure. The tricuspid regurgitant velocity is 2.68 m/s, and  with an assumed right atrial pressure of 3 mmHg, the estimated right ventricular systolic pressure is 31.7 mmHg. Left Atrium: Left atrial size was normal in size. Right Atrium: Right atrial size was normal in size. Pericardium: There is no evidence of pericardial effusion. Mitral Valve: The mitral valve is normal in structure. Mild mitral valve regurgitation. MV peak gradient, 3.9 mmHg. The mean mitral valve gradient is 2.0 mmHg. Tricuspid Valve: The tricuspid valve is normal in structure. Tricuspid valve regurgitation is mild. Aortic Valve: The aortic valve is tricuspid. Aortic valve regurgitation is mild. Aortic regurgitation PHT measures 309 msec. Aortic valve mean gradient measures 5.3 mmHg. Aortic valve peak gradient measures 10.1 mmHg. Aortic valve area, by VTI measures 2.07 cm. Pulmonic Valve: The pulmonic valve was normal in structure. Pulmonic valve regurgitation is mild. Aorta: Aortic dilatation noted. There is mild dilatation of the ascending aorta, measuring 44 mm. Venous: The inferior vena cava is normal in size with greater than 50% respiratory variability, suggesting right atrial pressure of 3 mmHg. IAS/Shunts: No atrial level shunt detected by color flow Doppler.  LEFT VENTRICLE PLAX 2D LVIDd:         3.80 cm     Diastology LVIDs:         3.30 cm     LV e' medial:    10.50 cm/s LV PW:         1.30 cm     LV E/e' medial:  8.0 LV IVS:        1.40 cm     LV e' lateral:   10.50 cm/s LVOT diam:     1.90 cm     LV E/e' lateral: 8.0 LV SV:         43 LV SV  Index:   23 LVOT Area:     2.84 cm  LV Volumes (MOD) LV vol d, MOD A2C: 60.2 ml LV vol d, MOD A4C: 59.6 ml LV vol s, MOD A2C: 25.6 ml LV vol s, MOD A4C: 15.6 ml LV SV MOD A2C:     34.6 ml LV SV MOD A4C:     59.6 ml LV SV MOD BP:      41.0 ml RIGHT VENTRICLE             IVC RV Basal diam:  3.00 cm     IVC diam: 1.00 cm RV S prime:     11.85 cm/s TAPSE (M-mode): 1.4 cm LEFT ATRIUM  Index        RIGHT ATRIUM           Index LA diam:        3.70 cm 1.94 cm/m   RA Area:     14.50 cm LA Vol (A2C):   32.8 ml 17.17 ml/m  RA Volume:   34.00 ml  17.80 ml/m LA Vol (A4C):   52.3 ml 27.39 ml/m LA Biplane Vol: 42.5 ml 22.25 ml/m  AORTIC VALVE                     PULMONIC VALVE AV Area (Vmax):    2.04 cm      PV Vmax:          1.35 m/s AV Area (Vmean):   2.07 cm      PV Peak grad:     7.3 mmHg AV Area (VTI):     2.07 cm      PR End Diast Vel: 8.47 msec AV Vmax:           159.00 cm/s AV Vmean:          107.167 cm/s AV VTI:            0.210 m AV Peak Grad:      10.1 mmHg AV Mean Grad:      5.3 mmHg LVOT Vmax:         114.67 cm/s LVOT Vmean:        78.100 cm/s LVOT VTI:          0.153 m LVOT/AV VTI ratio: 0.73 AI PHT:            309 msec  AORTA Ao Root diam: 3.10 cm Ao Asc diam:  4.40 cm MITRAL VALVE               TRICUSPID VALVE MV Area (PHT): 8.29 cm    TR Peak grad:   28.7 mmHg MV Area VTI:   4.59 cm    TR Vmax:        268.00 cm/s MV Peak grad:  3.9 mmHg MV Mean grad:  2.0 mmHg    SHUNTS MV Vmax:       0.99 m/s    Systemic VTI:  0.15 m MV Vmean:      63.0 cm/s   Systemic Diam: 1.90 cm MV Decel Time: 92 msec MV E velocity: 83.85 cm/s Redell Cave MD Electronically signed by Redell Cave MD Signature Date/Time: 03/31/2024/1:08:57 PM    Final    DG OR UROLOGY CYSTO IMAGE ([facility] ONLY) Result Date: 03/31/2024 There is no interpretation for this exam.  This order is for images obtained during a surgical procedure.  Please See Surgeries Tab for more information regarding the procedure.   CT ABDOMEN  PELVIS WO CONTRAST Result Date: 03/31/2024 CLINICAL DATA:  73 year old female with weakness, decreased p.o., abdominal pain. EXAM: CT ABDOMEN AND PELVIS WITHOUT CONTRAST TECHNIQUE: Multidetector CT imaging of the abdomen and pelvis was performed following the standard protocol without IV contrast. RADIATION DOSE REDUCTION: This exam was performed according to the departmental dose-optimization program which includes automated exposure control, adjustment of the mA and/or kV according to patient size and/or use of iterative reconstruction technique. COMPARISON:  Noncontrast CT Abdomen and Pelvis 01/21/2021. FINDINGS: Lower chest: Calcified coronary artery atherosclerosis. Heart size remains normal. No pericardial effusion. Chronic tortuosity of the descending thoracic aorta. Lower lung volumes. Fairly symmetric lung base atelectasis. Chronic elevation of the right hemidiaphragm. No pleural effusion.  Hepatobiliary: Mild gallbladder fundus calcification. Otherwise negative noncontrast liver and gallbladder. Pancreas: Negative. Spleen: Negative. Adrenals/Urinary Tract: Normal adrenal glands. Negative noncontrast left kidney and left ureter. Right double-J ureteral stent was in place in 2022, now removed. There is moderate to severe right hydronephrosis and right hydroureter with pararenal and periureteral inflammatory stranding. The right ureter remains dilated to the pelvic inlet where obstructing 2 adjacent ureteral calculi are identified on coronal image 54. The larger is 4 mm. Distal to that the ureter is decompressed in the pelvis and to the bladder. Diminutive bladder. Punctate additional right lower pole nephrolithiasis. Stomach/Bowel: Fluid in nondilated sigmoid colon and the rectum. Mildly featureless appearance of the sigmoid. Decompressed upstream descending colon with diverticulosis but no active inflammation. Similar decompressed transverse colon with mild diverticulosis. Redundant hepatic flexure.  Decompressed right colon. Retrocecal appendix appears to remain normal, contains gas and tracks to the inferior liver contour on coronal image 48. Decompressed terminal ileum. No dilated small bowel. Trace oral contrast or other dense enteric material in the stomach along with retained fluid. Decompressed duodenum. No pneumoperitoneum. No free fluid identified. Vascular/Lymphatic: Aortoiliac calcified atherosclerosis. Chronic tortuosity of the aorta. Negative for abdominal aortic aneurysm. Vascular patency is not evaluated in the absence of IV contrast. No lymphadenopathy identified. Reproductive: Chronically absent. Other: No pelvis free fluid. Musculoskeletal: Chronic degenerative appearing lower lumbar and lumbosacral junction facet ankylosis. Advanced facet arthropathy at the adjacent segment. Chronic lower thoracic flowing endplate osteophytes with interbody ankylosis. No acute osseous abnormality identified. IMPRESSION: 1. Moderate to Severe acute obstructive uropathy on the right with Hydronephrosis and Hydroureter due to two adjacent ureteral calculiat the pelvic inlet, the larger 4 mm. Punctate additional right nephrolithiasis. 2. Fluid in the nondilated sigmoid colon and rectum raising the possibility of diarrhea. No bowel obstruction. Normal retrocecal appendix. 3.  Aortic Atherosclerosis (ICD10-I70.0). Electronically Signed   By: VEAR Hurst M.D.   On: 03/31/2024 08:00   DG Chest Port 1 View Result Date: 03/31/2024 CLINICAL DATA:  73 year old female with weakness, decreased p.o. EXAM: PORTABLE CHEST 1 VIEW COMPARISON:  Portable chest 12/27/2020. FINDINGS: Portable AP upright view at 0637 hours. Improved lung volumes. Mild elevation of the right hemidiaphragm is less pronounced, probably normal variant. Normal cardiac size and mediastinal contours. Visualized tracheal air column is within normal limits. Allowing for portable technique the lungs are clear. No pneumothorax or pleural effusion. Paucity of  bowel gas in the upper abdomen. No acute osseous abnormality identified. IMPRESSION: No acute cardiopulmonary abnormality. Electronically Signed   By: VEAR Hurst M.D.   On: 03/31/2024 06:46     Assessment and Plan: A-fib with RVR - Likely driven by underlying UTI induced sepsis - Stop Toprol -XL, start Lopressor  25 mg every 6. - Continue amiodarone drip.  - Start heparin, transition to PTA Eliquis  when no additional procedures planned. - Echo showed normal EF 55 to 60%.  2.  UTI induced sepsis, right ureteral stone - S/p ureteral stent - Antibiotics per IM, urology following.  3.  Grief secondary to loss of child -May be depressed - Consider psychiatry input   Signed, Redell Cave, MD  03/31/2024 3:28 PM

## 2024-03-31 NOTE — ED Notes (Signed)
 Fall precautions in place for Pt. This RN placed fall band, fall grip socks, bed alarm and fall sign.

## 2024-03-31 NOTE — Progress Notes (Signed)
 CODE SEPSIS - PHARMACY COMMUNICATION  **Broad Spectrum Antibiotics should be administered within 1 hour of Sepsis diagnosis**  Time Code Sepsis Called/Page Received: 0715  Antibiotics Ordered: Cefepime  & vancomycin & metronidazole  Time of 1st antibiotic administration: 0739  Additional action taken by pharmacy: N/A  Cynthia Lucas Mare 03/31/2024  7:20 AM

## 2024-03-31 NOTE — Consult Note (Addendum)
 Reason for Consult: RIGHT Ureteral STone, Urosepsis, Stage 3 Renal Insuficiency  Referring Physician:  Carlin Palin MD  Cynthia Lucas is an 73 y.o. female.   HPI:   1 - RIGHT Ureteral Stone - 4mm Rt distal 1/3 stone x2 on ER CT on eval sepssis. Moderate hydro, no additional foci of stones.  2 - Urosepsis - lactate 4.8, tachycardia, WBX 17k, urine with significant infectious parameters on eval sepsis 03/31/24.  3 - Stage 3 Renal Insufficiency - Cr 2's at baseline. Ho DM.  PMH sig for AFib (follows cards), DM2, severe tremor. Lives with family.   Today Cynthia Lucas is seen as emergent consult for above. Last meal >12 hours ago.    Past Medical History:  Diagnosis Date   Atrial fibrillation (HCC)    Cough    CHRONIC   Diabetes mellitus without complication (HCC)    GERD (gastroesophageal reflux disease)    History of kidney stones    Hypertension    Sleep apnea    no cpap   Tremor    HEAD    Past Surgical History:  Procedure Laterality Date   ABDOMINAL HYSTERECTOMY     CATARACT EXTRACTION W/PHACO Left 03/07/2018   Procedure: CATARACT EXTRACTION PHACO AND INTRAOCULAR LENS PLACEMENT (IOC);  Surgeon: Jaye Fallow, MD;  Location: [facility];  Service: Ophthalmology;  Laterality: Left;  Lot # [id number] H US : 04:19.8 AP%: 19.9 CDE: 51.70   CATARACT EXTRACTION W/PHACO Right 07/07/2020   Procedure: CATARACT EXTRACTION PHACO AND INTRAOCULAR LENS PLACEMENT (IOC) RIGHT DIABETIC 7.75 00:42.8;  Surgeon: Jaye Fallow, MD;  Location: [facility];  Service: Ophthalmology;  Laterality: Right;  diabetic - oral meds   CESAREAN SECTION     COLONOSCOPY     CYSTOSCOPY W/ RETROGRADES Right 01/07/2019   Procedure: CYSTOSCOPY WITH RETROGRADE PYELOGRAM;  Surgeon: Twylla Glendia BROCKS, MD;  Location: [facility];  Service: Urology;  Laterality: Right;   CYSTOSCOPY W/ URETERAL STENT PLACEMENT Right 12/28/2020   Procedure: CYSTOSCOPY WITH RETROGRADE PYELOGRAM/URETERAL STENT PLACEMENT;  Surgeon: Watt Rush, MD;  Location: [facility];  Service: Urology;  Laterality: Right;   CYSTOSCOPY WITH STENT PLACEMENT Right 01/07/2019   Procedure: CYSTOSCOPY WITH STENT PLACEMENT;  Surgeon: Twylla Glendia BROCKS, MD;  Location: [facility];  Service: Urology;  Laterality: Right;   CYSTOSCOPY WITH URETEROSCOPY, STONE BASKETRY AND STENT PLACEMENT Right 01/29/2019   Procedure: CYSTOSCOPY/URETEROSCOPY/HOLMIUM LASER/STENT EXCHANGE;  Surgeon: Twylla Glendia BROCKS, MD;  Location: [facility];  Service: Urology;  Laterality: Right;   CYSTOSCOPY/URETEROSCOPY/HOLMIUM LASER/STENT PLACEMENT Right 02/05/2021   Procedure: CYSTOSCOPY/URETEROSCOPY/HOLMIUM LASER/STENT EXCHANGE;  Surgeon: Francisca Redell BROCKS, MD;  Location: [facility];  Service: Urology;  Laterality: Right;   ESOPHAGOGASTRODUODENOSCOPY N/A 01/02/2021   Procedure: ESOPHAGOGASTRODUODENOSCOPY (EGD);  Surgeon: Toledo, Ladell POUR, MD;  Location: [facility];  Service: Gastroenterology;  Laterality: N/A;   JOINT REPLACEMENT      Family History  Problem Relation Age of Onset   Heart disease Mother    Breast cancer Neg Hx     Social History:  reports that she has never smoked. She has never used smokeless tobacco. She reports current alcohol use. She reports that she does not use drugs.  Allergies:  Allergies  Allergen Reactions   Other     Contact lense solution causes eye irritation    Medications: I have reviewed the patient's current medications.  Results for orders placed or performed during the hospital encounter of 03/31/24 (from the past 48 hours)  Urinalysis, w/ Reflex to Culture (Infection Suspected) -Urine, Catheterized  Status: Abnormal   Collection Time: 03/31/24  6:26 AM  Result Value Ref Range   Specimen Source URINE, CATHETERIZED    Color, Urine AMBER (A) YELLOW    Comment: BIOCHEMICALS MAY BE AFFECTED BY COLOR   APPearance TURBID (A) CLEAR   Specific Gravity, Urine 1.024 1.005 - 1.030   pH 5.0 5.0 - 8.0   Glucose, UA >=500 (A) NEGATIVE mg/dL   Hgb urine  dipstick LARGE (A) NEGATIVE   Bilirubin Urine NEGATIVE NEGATIVE   Ketones, ur NEGATIVE NEGATIVE mg/dL   Protein, ur 899 (A) NEGATIVE mg/dL   Nitrite NEGATIVE NEGATIVE   Leukocytes,Ua LARGE (A) NEGATIVE   RBC / HPF 21-50 0 - 5 RBC/hpf   WBC, UA >50 0 - 5 WBC/hpf    Comment:        Reflex urine culture not performed if WBC <=10, OR if Squamous epithelial cells >5. If Squamous epithelial cells >5 suggest recollection.    Bacteria, UA MANY (A) NONE SEEN   Squamous Epithelial / HPF 0-5 0 - 5 /HPF   WBC Clumps PRESENT     Comment: Performed at [facility], [address] [place], [place] [address]  Resp panel by RT-PCR (RSV, Flu A&B, Covid) Urine, Catheterized     Status: None   Collection Time: 03/31/24  6:26 AM   Specimen: Urine, Catheterized; Nasal Swab  Result Value Ref Range   SARS Coronavirus 2 by RT PCR NEGATIVE NEGATIVE    Comment: (NOTE) SARS-CoV-2 target nucleic acids are NOT DETECTED.  The SARS-CoV-2 RNA is generally detectable in upper respiratory specimens during the acute phase of infection. The lowest concentration of SARS-CoV-2 viral copies this assay can detect is 138 copies/mL. A negative result does not preclude SARS-Cov-2 infection and should not be used as the sole basis for treatment or other patient management decisions. A negative result may occur with  improper specimen collection/handling, submission of specimen other than nasopharyngeal swab, presence of viral mutation(s) within the areas targeted by this assay, and inadequate number of viral copies(<138 copies/mL). A negative result must be combined with clinical observations, patient history, and epidemiological information. The expected result is Negative.  Fact Sheet for Patients:  BloggerCourse.com  Fact Sheet for Healthcare Providers:  SeriousBroker.it  This test is no t yet approved or cleared by the [place]  FDA and  has been  authorized for detection and/or diagnosis of SARS-CoV-2 by FDA under an Emergency Use Authorization (EUA). This EUA will remain  in effect (meaning this test can be used) for the duration of the COVID-19 declaration under Section 564(b)(1) of the Act, 21 U.S.C.section 360bbb-3(b)(1), unless the authorization is terminated  or revoked sooner.       Influenza A by PCR NEGATIVE NEGATIVE   Influenza B by PCR NEGATIVE NEGATIVE    Comment: (NOTE) The Xpert Xpress SARS-CoV-2/FLU/RSV plus assay is intended as an aid in the diagnosis of influenza from Nasopharyngeal swab specimens and should not be used as a sole basis for treatment. Nasal washings and aspirates are unacceptable for Xpert Xpress SARS-CoV-2/FLU/RSV testing.  Fact Sheet for Patients: BloggerCourse.com  Fact Sheet for Healthcare Providers: SeriousBroker.it  This test is not yet approved or cleared by the [place]  FDA and has been authorized for detection and/or diagnosis of SARS-CoV-2 by FDA under an Emergency Use Authorization (EUA). This EUA will remain in effect (meaning this test can be used) for the duration of the COVID-19 declaration under Section 564(b)(1) of the Act, 21 U.S.C. section 360bbb-3(b)(1),  unless the authorization is terminated or revoked.     Resp Syncytial Virus by PCR NEGATIVE NEGATIVE    Comment: (NOTE) Fact Sheet for Patients: BloggerCourse.com  Fact Sheet for Healthcare Providers: SeriousBroker.it  This test is not yet approved or cleared by the [place]  FDA and has been authorized for detection and/or diagnosis of SARS-CoV-2 by FDA under an Emergency Use Authorization (EUA). This EUA will remain in effect (meaning this test can be used) for the duration of the COVID-19 declaration under Section 564(b)(1) of the Act, 21 U.S.C. section 360bbb-3(b)(1), unless the authorization is  terminated or revoked.  Performed at [facility], [address] [place], [place] [address]   Lactic acid, plasma     Status: Abnormal   Collection Time: 03/31/24  6:29 AM  Result Value Ref Range   Lactic Acid, Venous 4.8 (HH) 0.5 - 1.9 mmol/L    Comment: CRITICAL RESULT CALLED TO, READ BACK BY AND VERIFIED WITH JES SAVAGE @0709  03/31/24 MJU Performed at [facility], [address] [place], [place] [address]   CBC with Differential     Status: Abnormal   Collection Time: 03/31/24  6:29 AM  Result Value Ref Range   WBC 17.4 (H) 4.0 - 10.5 K/uL   RBC 4.95 3.87 - 5.11 MIL/uL   Hemoglobin 13.0 12.0 - 15.0 g/dL   HCT 60.2 63.9 - 53.9 %   MCV 80.2 80.0 - 100.0 fL   MCH 26.3 26.0 - 34.0 pg   MCHC 32.7 30.0 - 36.0 g/dL   RDW 84.0 (H) 88.4 - 84.4 %   Platelets 161 150 - 400 K/uL   nRBC 0.0 0.0 - 0.2 %   Neutrophils Relative % 90 %   Neutro Abs 15.8 (H) 1.7 - 7.7 K/uL   Lymphocytes Relative 4 %   Lymphs Abs 0.7 0.7 - 4.0 K/uL   Monocytes Relative 2 %   Monocytes Absolute 0.3 0.1 - 1.0 K/uL   Eosinophils Relative 0 %   Eosinophils Absolute 0.0 0.0 - 0.5 K/uL   Basophils Relative 1 %   Basophils Absolute 0.1 0.0 - 0.1 K/uL   WBC Morphology Mild Left Shift (1-5% metas, occ myelo)    RBC Morphology MORPHOLOGY UNREMARKABLE    Smear Review Normal platelet morphology    Immature Granulocytes 3 %   Abs Immature Granulocytes 0.45 (H) 0.00 - 0.07 K/uL    Comment: Performed at [facility], [address] [place], [place] [address]  Comprehensive metabolic panel     Status: Abnormal   Collection Time: 03/31/24  6:29 AM  Result Value Ref Range   Sodium 135 135 - 145 mmol/L   Potassium 2.9 (L) 3.5 - 5.1 mmol/L   Chloride 100 98 - 111 mmol/L   CO2 22 22 - 32 mmol/L   Glucose, Bld 359 (H) 70 - 99 mg/dL    Comment: Glucose reference range applies only to samples taken after fasting for at least 8 hours.   BUN 48 (H) 8 - 23 mg/dL   Creatinine, Ser 7.35 (H)  0.44 - 1.00 mg/dL   Calcium  8.3 (L) 8.9 - 10.3 mg/dL   Total Protein 6.7 6.5 - 8.1 g/dL   Albumin 3.1 (L) 3.5 - 5.0 g/dL   AST 60 (H) 15 - 41 U/L   ALT 31 0 - 44 U/L   Alkaline Phosphatase 72 38 - 126 U/L   Total Bilirubin 1.0 0.0 - 1.2 mg/dL   GFR, Estimated 19 (L) >60  mL/min    Comment: (NOTE) Calculated using the CKD-EPI Creatinine Equation (2021)    Anion gap 13 5 - 15    Comment: Performed at [facility], [address] [place], [place] [address]  Lipase, blood     Status: None   Collection Time: 03/31/24  6:29 AM  Result Value Ref Range   Lipase 26 11 - 51 U/L    Comment: Performed at [facility], [address] [place], [place] [address]  Troponin I (High Sensitivity)     Status: Abnormal   Collection Time: 03/31/24  6:29 AM  Result Value Ref Range   Troponin I (High Sensitivity) 88 (H) <18 ng/L    Comment: (NOTE) Elevated high sensitivity troponin I (hsTnI) values and significant  changes across serial measurements may suggest ACS but many other  chronic and acute conditions are known to elevate hsTnI results.  Refer to the Links section for chest pain algorithms and additional  guidance. Performed at [facility], [address] [place], [place] [address]   Magnesium      Status: None   Collection Time: 03/31/24  6:29 AM  Result Value Ref Range   Magnesium  2.0 1.7 - 2.4 mg/dL    Comment: Performed at [facility], [address] [place], [place] [address]    CT ABDOMEN PELVIS WO CONTRAST Result Date: 03/31/2024 CLINICAL DATA:  73 year old female with weakness, decreased p.o., abdominal pain. EXAM: CT ABDOMEN AND PELVIS WITHOUT CONTRAST TECHNIQUE: Multidetector CT imaging of the abdomen and pelvis was performed following the standard protocol without IV contrast. RADIATION DOSE REDUCTION: This exam was performed according to the departmental dose-optimization program which includes automated exposure control, adjustment of  the mA and/or kV according to patient size and/or use of iterative reconstruction technique. COMPARISON:  Noncontrast CT Abdomen and Pelvis 01/21/2021. FINDINGS: Lower chest: Calcified coronary artery atherosclerosis. Heart size remains normal. No pericardial effusion. Chronic tortuosity of the descending thoracic aorta. Lower lung volumes. Fairly symmetric lung base atelectasis. Chronic elevation of the right hemidiaphragm. No pleural effusion. Hepatobiliary: Mild gallbladder fundus calcification. Otherwise negative noncontrast liver and gallbladder. Pancreas: Negative. Spleen: Negative. Adrenals/Urinary Tract: Normal adrenal glands. Negative noncontrast left kidney and left ureter. Right double-J ureteral stent was in place in 2022, now removed. There is moderate to severe right hydronephrosis and right hydroureter with pararenal and periureteral inflammatory stranding. The right ureter remains dilated to the pelvic inlet where obstructing 2 adjacent ureteral calculi are identified on coronal image 54. The larger is 4 mm. Distal to that the ureter is decompressed in the pelvis and to the bladder. Diminutive bladder. Punctate additional right lower pole nephrolithiasis. Stomach/Bowel: Fluid in nondilated sigmoid colon and the rectum. Mildly featureless appearance of the sigmoid. Decompressed upstream descending colon with diverticulosis but no active inflammation. Similar decompressed transverse colon with mild diverticulosis. Redundant hepatic flexure. Decompressed right colon. Retrocecal appendix appears to remain normal, contains gas and tracks to the inferior liver contour on coronal image 48. Decompressed terminal ileum. No dilated small bowel. Trace oral contrast or other dense enteric material in the stomach along with retained fluid. Decompressed duodenum. No pneumoperitoneum. No free fluid identified. Vascular/Lymphatic: Aortoiliac calcified atherosclerosis. Chronic tortuosity of the aorta. Negative for  abdominal aortic aneurysm. Vascular patency is not evaluated in the absence of IV contrast. No lymphadenopathy identified. Reproductive: Chronically absent. Other: No pelvis free fluid. Musculoskeletal: Chronic degenerative appearing lower lumbar and lumbosacral junction facet ankylosis. Advanced facet arthropathy at the adjacent segment. Chronic lower  thoracic flowing endplate osteophytes with interbody ankylosis. No acute osseous abnormality identified. IMPRESSION: 1. Moderate to Severe acute obstructive uropathy on the right with Hydronephrosis and Hydroureter due to two adjacent ureteral calculiat the pelvic inlet, the larger 4 mm. Punctate additional right nephrolithiasis. 2. Fluid in the nondilated sigmoid colon and rectum raising the possibility of diarrhea. No bowel obstruction. Normal retrocecal appendix. 3.  Aortic Atherosclerosis (ICD10-I70.0). Electronically Signed   By: VEAR Hurst M.D.   On: 03/31/2024 08:00   DG Chest Port 1 View Result Date: 03/31/2024 CLINICAL DATA:  73 year old female with weakness, decreased p.o. EXAM: PORTABLE CHEST 1 VIEW COMPARISON:  Portable chest 12/27/2020. FINDINGS: Portable AP upright view at 0637 hours. Improved lung volumes. Mild elevation of the right hemidiaphragm is less pronounced, probably normal variant. Normal cardiac size and mediastinal contours. Visualized tracheal air column is within normal limits. Allowing for portable technique the lungs are clear. No pneumothorax or pleural effusion. Paucity of bowel gas in the upper abdomen. No acute osseous abnormality identified. IMPRESSION: No acute cardiopulmonary abnormality. Electronically Signed   By: VEAR Hurst M.D.   On: 03/31/2024 06:46    Review of Systems  Constitutional:  Positive for fatigue and fever.  Genitourinary:  Positive for urgency.  Psychiatric/Behavioral:  The patient is nervous/anxious.   All other systems reviewed and are negative.  Blood pressure 114/74, pulse (!) 165, temperature 98.7 F  (37.1 C), temperature source Oral, resp. rate 18, height 5' 7 (1.702 m), weight 79.3 kg, SpO2 97%. Physical Exam Vitals reviewed.  Constitutional:      Comments: Severe global tremor. Very pleasant, AOx3.   HENT:     Head: Normocephalic.   Eyes:     Pupils: Pupils are equal, round, and reactive to light.    Cardiovascular:     Rate and Rhythm: Normal rate.  Abdominal:     General: Abdomen is flat.     Comments: Moderate truncal obesity.   Genitourinary:    Comments: Minimal Rt CVAT at present.   Musculoskeletal:        General: Normal range of motion.     Cervical back: Normal range of motion.   Skin:    General: Skin is warm.   Neurological:     General: No focal deficit present.     Mental Status: She is alert.   Psychiatric:        Mood and Affect: Mood normal.     Assessment/Plan:  Explained situation with potentially life-threatening obstructed GU infection and rec of emergent renal decompression with ureteral stent, then admission for IV ABX / recussication, then defenitive stone management with ureteroscopy in elective setting. Risks, benefits, alternatives, expected peri-op course for stenting discussed. She voiced understanding. Verbal consent with witness signiature as she cannot sign due to severe tremor.   Ricardo KATHEE Alvaro Mickey. 03/31/2024, 8:48 AM

## 2024-03-31 NOTE — Anesthesia Procedure Notes (Signed)
 Procedure Name: LMA Insertion Date/Time: 03/31/2024 10:05 AM  Performed by: Tod Handing, CRNAPre-anesthesia Checklist: Patient identified, Patient being monitored, Timeout performed, Emergency Drugs available and Suction available Patient Re-evaluated:Patient Re-evaluated prior to induction Oxygen Delivery Method: Circle system utilized Preoxygenation: Pre-oxygenation with 100% oxygen Induction Type: IV induction Ventilation: Mask ventilation without difficulty LMA: LMA inserted Tube type: Oral Number of attempts: 1 Placement Confirmation: positive ETCO2 and breath sounds checked- equal and bilateral Tube secured with: Tape Dental Injury: Teeth and Oropharynx as per pre-operative assessment

## 2024-03-31 NOTE — H&P (Signed)
 History and Physical    Cynthia Lucas FMW:[id number] DOB: [birth year] DOA: 03/31/2024  PCP: Sampson Ethridge LABOR, MD  Patient coming from:   I have personally briefly reviewed patient's old medical records in [facility]  Chief Complaint: severe depression not eating or drinking after death of her son  HPI: Cynthia Lucas is a 73 y.o. female with medical history significant of  Atrial fibrillation , DMII, GERD, renal stones, Hypertension , OSA not on CPAP, essential tremor , who presents BIB EMS due to failure to thrive at home after son death with family nothing patient appears to be depressed and will not complete her ADLS and has decrease appetite.  ON arrival EMS noted vitals 123/76, hr 110,  fs248, sat 100% on ra. Patient currently no complaints,no sob no n/v/d no current pain.   ED Course:  Vitals: afeb, hr 142, BP IN ED patient was noted to be in afib with rvr necessitating CCB drip.  Patient was also diagnosed with infected renal stone with associated hydronephrosis . Patient  case discussed with urology on call . Plans made for stent placement.  RVP-neg EKG: Afib with rvr 143 UA: + wbc>50, rbc21-50,  largre LE - many bacteria  Wbc 17.4, hgb 13, plt 161, with left shift  Albumin 3.1, ast 60, cr 2.64 ( 1.6)  gli359 ( AG 13) Na 135, K 2.9 ,CL 100,  Lipase 26 CE 88, 50  Lactic 4.8 Mag 2 Cxr:NAD CTAB IMPRESSION: 1. Moderate to Severe acute obstructive uropathy on the right with Hydronephrosis and Hydroureter due to two adjacent ureteral calculiat the pelvic inlet, the larger 4 mm. Punctate additional right nephrolithiasis. 2. Fluid in the nondilated sigmoid colon and rectum raising the possibility of diarrhea. No bowel obstruction. Normal retrocecal appendix.  Tx  Cardizem  drip, NS  1L, 1L. KCL 40 meq,metronidazole,calcium  gluconate  Review of Systems: As per HPI otherwise 10 point review of systems negative.   Past Medical History:  Diagnosis Date   Atrial  fibrillation (HCC)    Cough    CHRONIC   Diabetes mellitus without complication (HCC)    GERD (gastroesophageal reflux disease)    History of kidney stones    Hypertension    Sleep apnea    no cpap   Tremor    HEAD    Past Surgical History:  Procedure Laterality Date   ABDOMINAL HYSTERECTOMY     CATARACT EXTRACTION W/PHACO Left 03/07/2018   Procedure: CATARACT EXTRACTION PHACO AND INTRAOCULAR LENS PLACEMENT (IOC);  Surgeon: Jaye Fallow, MD;  Location: [facility];  Service: Ophthalmology;  Laterality: Left;  Lot # [id number] H US : 04:19.8 AP%: 19.9 CDE: 51.70   CATARACT EXTRACTION W/PHACO Right 07/07/2020   Procedure: CATARACT EXTRACTION PHACO AND INTRAOCULAR LENS PLACEMENT (IOC) RIGHT DIABETIC 7.75 00:42.8;  Surgeon: Jaye Fallow, MD;  Location: [facility];  Service: Ophthalmology;  Laterality: Right;  diabetic - oral meds   CESAREAN SECTION     COLONOSCOPY     CYSTOSCOPY W/ RETROGRADES Right 01/07/2019   Procedure: CYSTOSCOPY WITH RETROGRADE PYELOGRAM;  Surgeon: Twylla Glendia BROCKS, MD;  Location: [facility];  Service: Urology;  Laterality: Right;   CYSTOSCOPY W/ URETERAL STENT PLACEMENT Right 12/28/2020   Procedure: CYSTOSCOPY WITH RETROGRADE PYELOGRAM/URETERAL STENT PLACEMENT;  Surgeon: Watt Rush, MD;  Location: [facility];  Service: Urology;  Laterality: Right;   CYSTOSCOPY WITH STENT PLACEMENT Right 01/07/2019   Procedure: CYSTOSCOPY WITH STENT PLACEMENT;  Surgeon: Twylla Glendia BROCKS, MD;  Location: [facility];  Service: Urology;  Laterality:  Right;   CYSTOSCOPY WITH URETEROSCOPY, STONE BASKETRY AND STENT PLACEMENT Right 01/29/2019   Procedure: CYSTOSCOPY/URETEROSCOPY/HOLMIUM LASER/STENT EXCHANGE;  Surgeon: Twylla Glendia BROCKS, MD;  Location: [facility];  Service: Urology;  Laterality: Right;   CYSTOSCOPY/URETEROSCOPY/HOLMIUM LASER/STENT PLACEMENT Right 02/05/2021   Procedure: CYSTOSCOPY/URETEROSCOPY/HOLMIUM LASER/STENT EXCHANGE;  Surgeon: Francisca Redell BROCKS, MD;  Location: [facility];   Service: Urology;  Laterality: Right;   ESOPHAGOGASTRODUODENOSCOPY N/A 01/02/2021   Procedure: ESOPHAGOGASTRODUODENOSCOPY (EGD);  Surgeon: Toledo, Ladell POUR, MD;  Location: [facility];  Service: Gastroenterology;  Laterality: N/A;   JOINT REPLACEMENT       reports that she has never smoked. She has never used smokeless tobacco. She reports current alcohol use. She reports that she does not use drugs.  Allergies  Allergen Reactions   Other     Contact lense solution causes eye irritation    Family History  Problem Relation Age of Onset   Heart disease Mother    Breast cancer Neg Hx     Prior to Admission medications   Medication Sig Start Date End Date Taking? Authorizing Provider  apixaban  (ELIQUIS ) 5 MG TABS tablet Take 1 tablet (5 mg total) by mouth 2 (two) times daily. Blood thinner. 11/01/22 04/30/23  Furth, Cadence H, PA-C  atorvastatin  (LIPITOR) 40 MG tablet Take 1 tablet (40 mg total) by mouth at bedtime. Overdue follow visit.  PLEASE CALL OFFICE TO SCHEDULE APPOINTMENT PRIOR TO NEXT REFILL 08/25/23   Darron Deatrice LABOR, MD  brimonidine -timolol  (COMBIGAN ) 0.2-0.5 % ophthalmic solution Place 1 drop into both eyes every 12 (twelve) hours. Patient not taking: Reported on 11/01/2022    [provider]  hydrochlorothiazide  (HYDRODIURIL ) 25 MG tablet Take 0.5 tablets (12.5 mg total) by mouth daily. 11/01/22   Furth, Cadence H, PA-C  JARDIANCE  25 MG TABS tablet Take 1 tablet (25 mg total) by mouth daily. Overdue follow visit.  PLEASE CALL OFFICE TO SCHEDULE APPOINTMENT PRIOR TO NEXT REFILL 08/25/23   Darron Deatrice LABOR, MD  LUMIGAN 0.01 % SOLN Place 1 drop into the left eye at bedtime.  Patient not taking: Reported on 11/01/2022 01/17/19   [provider]  tamsulosin  (FLOMAX ) 0.4 MG CAPS capsule Take 0.4 mg by mouth. Takes occasionally Patient not taking: Reported on 11/01/2022    [provider]    Physical Exam: Vitals:   03/31/24 0827 03/31/24 0828 03/31/24  0829 03/31/24 0830  BP:    107/69  Pulse: (!) 56  (!) 165   Resp: (!) 28 (!) 22 18 (!) 25  Temp:      TempSrc:      SpO2: 93%  97%   Weight:      Height:        Constitutional: NAD, calm, comfortable/ noted tremor Vitals:   03/31/24 0827 03/31/24 0828 03/31/24 0829 03/31/24 0830  BP:    107/69  Pulse: (!) 56  (!) 165   Resp: (!) 28 (!) 22 18 (!) 25  Temp:      TempSrc:      SpO2: 93%  97%   Weight:      Height:       Eyes: pupils, lids and conjunctivae normal ENMT: Mucous membranes are moist.  Neck: normal, supple, no masses, no thyromegaly Respiratory: clear to auscultation bilaterally, no wheezing, no crackles. Normal respiratory effort. No accessory muscle use.  Cardiovascular: irregular and tachycardic, no murmurs / rubs / gallops. No extremity edema. 2+ pedal pulses.  Abdomen: no tenderness, no masses palpated. No hepatosplenomegaly. Bowel sounds positive.  Musculoskeletal: no clubbing / cyanosis. No joint deformity upper and lower extremities. Good ROM, no contractures. Normal muscle tone.  Skin: no rashes, lesions, ulcers. No induration Neurologic: CN grossly intact. Sensation intact,. Strength 5/5 in all 4.  Psychiatric: Normal judgment and insight. Alert and oriented x 3. Normal mood.    Labs on Admission: I have personally reviewed following labs and imaging studies  CBC: Recent Labs  Lab 03/31/24 0629  WBC 17.4*  NEUTROABS 15.8*  HGB 13.0  HCT 39.7  MCV 80.2  PLT 161   Basic Metabolic Panel: Recent Labs  Lab 03/31/24 0629  NA 135  K 2.9*  CL 100  CO2 22  GLUCOSE 359*  BUN 48*  CREATININE 2.64*  CALCIUM  8.3*  MG 2.0   GFR: Estimated Creatinine Clearance: 20.6 mL/min (A) (by C-G formula based on SCr of 2.64 mg/dL (H)). Liver Function Tests: Recent Labs  Lab 03/31/24 0629  AST 60*  ALT 31  ALKPHOS 72  BILITOT 1.0  PROT 6.7  ALBUMIN 3.1*   Recent Labs  Lab 03/31/24 0629  LIPASE 26   No results for input(s): AMMONIA in the last  168 hours. Coagulation Profile: No results for input(s): INR, PROTIME in the last 168 hours. Cardiac Enzymes: No results for input(s): CKTOTAL, CKMB, CKMBINDEX, TROPONINI in the last 168 hours. BNP (last 3 results) No results for input(s): PROBNP in the last 8760 hours. HbA1C: No results for input(s): HGBA1C in the last 72 hours. CBG: No results for input(s): GLUCAP in the last 168 hours. Lipid Profile: No results for input(s): CHOL, HDL, LDLCALC, TRIG, CHOLHDL, LDLDIRECT in the last 72 hours. Thyroid Function Tests: No results for input(s): TSH, T4TOTAL, FREET4, T3FREE, THYROIDAB in the last 72 hours. Anemia Panel: No results for input(s): VITAMINB12, FOLATE, FERRITIN, TIBC, IRON , RETICCTPCT in the last 72 hours. Urine analysis:    Component Value Date/Time   COLORURINE AMBER (A) 03/31/2024 0626   APPEARANCEUR TURBID (A) 03/31/2024 0626   APPEARANCEUR Cloudy (A) 02/17/2021 1118   LABSPEC 1.024 03/31/2024 0626   PHURINE 5.0 03/31/2024 0626   GLUCOSEU >=500 (A) 03/31/2024 0626   HGBUR LARGE (A) 03/31/2024 0626   BILIRUBINUR NEGATIVE 03/31/2024 0626   BILIRUBINUR Negative 02/17/2021 1118   KETONESUR NEGATIVE 03/31/2024 0626   PROTEINUR 100 (A) 03/31/2024 0626   NITRITE NEGATIVE 03/31/2024 0626   LEUKOCYTESUR LARGE (A) 03/31/2024 0626    Radiological Exams on Admission: DG OR UROLOGY CYSTO IMAGE ([facility] ONLY) Result Date: 03/31/2024 There is no interpretation for this exam.  This order is for images obtained during a surgical procedure.  Please See Surgeries Tab for more information regarding the procedure.   CT ABDOMEN PELVIS WO CONTRAST Result Date: 03/31/2024 CLINICAL DATA:  73 year old female with weakness, decreased p.o., abdominal pain. EXAM: CT ABDOMEN AND PELVIS WITHOUT CONTRAST TECHNIQUE: Multidetector CT imaging of the abdomen and pelvis was performed following the standard protocol without IV contrast. RADIATION DOSE  REDUCTION: This exam was performed according to the departmental dose-optimization program which includes automated exposure control, adjustment of the mA and/or kV according to patient size and/or use of iterative reconstruction technique. COMPARISON:  Noncontrast CT Abdomen and Pelvis 01/21/2021. FINDINGS: Lower chest: Calcified coronary artery atherosclerosis. Heart size remains normal. No pericardial effusion. Chronic tortuosity of the descending thoracic aorta. Lower lung volumes. Fairly symmetric lung base atelectasis. Chronic elevation of the right hemidiaphragm. No pleural effusion. Hepatobiliary: Mild gallbladder fundus calcification. Otherwise negative noncontrast liver and gallbladder. Pancreas: Negative. Spleen: Negative. Adrenals/Urinary Tract: Normal adrenal  glands. Negative noncontrast left kidney and left ureter. Right double-J ureteral stent was in place in 2022, now removed. There is moderate to severe right hydronephrosis and right hydroureter with pararenal and periureteral inflammatory stranding. The right ureter remains dilated to the pelvic inlet where obstructing 2 adjacent ureteral calculi are identified on coronal image 54. The larger is 4 mm. Distal to that the ureter is decompressed in the pelvis and to the bladder. Diminutive bladder. Punctate additional right lower pole nephrolithiasis. Stomach/Bowel: Fluid in nondilated sigmoid colon and the rectum. Mildly featureless appearance of the sigmoid. Decompressed upstream descending colon with diverticulosis but no active inflammation. Similar decompressed transverse colon with mild diverticulosis. Redundant hepatic flexure. Decompressed right colon. Retrocecal appendix appears to remain normal, contains gas and tracks to the inferior liver contour on coronal image 48. Decompressed terminal ileum. No dilated small bowel. Trace oral contrast or other dense enteric material in the stomach along with retained fluid. Decompressed duodenum. No  pneumoperitoneum. No free fluid identified. Vascular/Lymphatic: Aortoiliac calcified atherosclerosis. Chronic tortuosity of the aorta. Negative for abdominal aortic aneurysm. Vascular patency is not evaluated in the absence of IV contrast. No lymphadenopathy identified. Reproductive: Chronically absent. Other: No pelvis free fluid. Musculoskeletal: Chronic degenerative appearing lower lumbar and lumbosacral junction facet ankylosis. Advanced facet arthropathy at the adjacent segment. Chronic lower thoracic flowing endplate osteophytes with interbody ankylosis. No acute osseous abnormality identified. IMPRESSION: 1. Moderate to Severe acute obstructive uropathy on the right with Hydronephrosis and Hydroureter due to two adjacent ureteral calculiat the pelvic inlet, the larger 4 mm. Punctate additional right nephrolithiasis. 2. Fluid in the nondilated sigmoid colon and rectum raising the possibility of diarrhea. No bowel obstruction. Normal retrocecal appendix. 3.  Aortic Atherosclerosis (ICD10-I70.0). Electronically Signed   By: VEAR Hurst M.D.   On: 03/31/2024 08:00   DG Chest Port 1 View Result Date: 03/31/2024 CLINICAL DATA:  73 year old female with weakness, decreased p.o. EXAM: PORTABLE CHEST 1 VIEW COMPARISON:  Portable chest 12/27/2020. FINDINGS: Portable AP upright view at 0637 hours. Improved lung volumes. Mild elevation of the right hemidiaphragm is less pronounced, probably normal variant. Normal cardiac size and mediastinal contours. Visualized tracheal air column is within normal limits. Allowing for portable technique the lungs are clear. No pneumothorax or pleural effusion. Paucity of bowel gas in the upper abdomen. No acute osseous abnormality identified. IMPRESSION: No acute cardiopulmonary abnormality. Electronically Signed   By: VEAR Hurst M.D.   On: 03/31/2024 06:46    EKG: Independently reviewed. See above  Assessment/Plan  UTI with Sepsis c/b infected renal stone and hydronephrosis -admit  to progressive care  -continue on broad spectrum abx  - continue on goal directed ivfs  -trend lactic acidic last 4.8  -source control , plan for urgent stent  -f/u on urology rec   AKI -in setting of sepsis and UTI with infected stone and hydronephrosis - s/p stent  - continue with ivs - hold nephrotoxic medications  - baseline 1.6 now 2.6  -strict I/o   Atrial fibrillation with RVR - in setting of sepsis  -continue on ivfs  -continue on CCB  - resume oral rate control agent as able  -holding OAC for procedure  -amiodarone due to lower bp -f/u cardiology recs   Failure to Thrive  ? Due to Depression  - psych consult once stable  -nutritional consult   Hypokalemia -replete prn  -f/u labs   Mild hypocalcemia  -s/p calcium  repletion in ED   OSA -not on cpap  GERD -ppi   Hx of Essential tremor  - no active issue      DVT prophylaxis: scd Code Status: full/ as discussed per patient wishes in event of cardiac arrest  Family Communication: none at bedside Disposition Plan: patient  expected to be admitted greater than 2 midnights Consults called: full/ as discussed per patient wishes in event of cardiac arrest  Admission status: progressive care    Camila DELENA Ned MD [facility]   If 7PM-7AM, please contact night-coverage [email or web address] Password [email or web address]  03/31/2024, 9:07 AM

## 2024-03-31 NOTE — ED Provider Notes (Signed)
-----------------------------------------   7:07 AM on 03/31/2024 -----------------------------------------  Blood pressure 114/70, pulse 100, temperature 98.7 F (37.1 C), temperature source Oral, resp. rate (!) 32, height 5' 7 (1.702 m), weight 79.3 kg, SpO2 98%.  Assuming care from Dr. Robinette.  In short, Cynthia Lucas is a 73 y.o. female with a chief complaint of Depression .  Refer to the original H&P for additional details.  The current plan of care is to follow-up additional labs and CT abdomen/pelvis for sepsis and AF RVR.  ----------------------------------------- 8:58 AM on 03/31/2024 ----------------------------------------- Urinalysis concerning for UTI and patient meets sepsis criteria, has been started on broad-spectrum antibiotics with 30 cc/kg of IV fluids.  CT imaging concerning for right-sided kidney stones with associated hydronephrosis.  Case discussed with Dr. Alvaro of urology, who will plan to take patient for stent later today.  Patient has not had significant improvement in her heart rate on diltiazem  drip, but suspect this will improve with better source control of her sepsis.  Patient remains awake and alert on reassessment with stable BP, case discussed with hospitalist for admission.   Willo Dunnings, MD 03/31/24 [phone]

## 2024-03-31 NOTE — ED Notes (Signed)
 EDP notified of lactic, see new orders

## 2024-03-31 NOTE — Anesthesia Postprocedure Evaluation (Signed)
 Anesthesia Post Note  [name]  Procedure(s) Performed: CYSTOSCOPY, WITH RETROGRADE PYELOGRAM AND URETERAL STENT INSERTION (Right: Ureter)  Patient location during evaluation: PACU Anesthesia Type: General Level of consciousness: awake and alert Pain management: pain level controlled Vital Signs Assessment: post-procedure vital signs reviewed and stable Respiratory status: spontaneous breathing, nonlabored ventilation, respiratory function stable and patient connected to nasal cannula oxygen Cardiovascular status: blood pressure returned to baseline and stable Postop Assessment: no apparent nausea or vomiting Anesthetic complications: no   No notable events documented.   Last Vitals:  Vitals:   03/31/24 1045 03/31/24 1100  BP: 105/86 121/74  Pulse:  (!) 108  Resp: (!) 29 (!) 25  Temp: 37 C   SpO2: 93% 100%    Last Pain:  Vitals:   03/31/24 1045  TempSrc:   PainSc: 0-No pain                 Lendia LITTIE Mae

## 2024-03-31 NOTE — ED Notes (Signed)
 EDP notified of HR, Cardizem  at max dose. EDP OK keeping it at max dose form now as long as MAP >65

## 2024-03-31 NOTE — Sepsis Progress Note (Signed)
 Elink following code sepsis  1216-  message provider and included bedside RN, second lactic increased, asked if he wanted to ordered a 3rd lactic, MD responded, will order 3rd lactic

## 2024-03-31 NOTE — Progress Notes (Signed)
 PHARMACY - ANTICOAGULATION CONSULT NOTE  Pharmacy Consult for Heparin Indication: atrial fibrillation  Allergies  Allergen Reactions   Nickel Itching and Swelling   Other     Contact lense solution causes eye irritation    Patient Measurements: Height: 5' 7 (170.2 cm) Weight: 79.3 kg (174 lb 12.8 oz) IBW/kg (Calculated) : 61.6 HEPARIN DW (KG): 77.7  Vital Signs: Temp: 98.9 F (37.2 C) (06/29 1300) Temp Source: Oral (06/29 0624) BP: 122/77 (06/29 1400) Pulse Rate: 108 (06/29 1400)  Labs: Recent Labs    03/31/24 0629 03/31/24 0815 03/31/24 1147  HGB 13.0  --   --   HCT 39.7  --   --   PLT 161  --   --   CREATININE 2.64*  --  1.98*  TROPONINIHS 88* 50*  --     Estimated Creatinine Clearance: 27.4 mL/min (A) (by C-G formula based on SCr of 1.98 mg/dL (H)).   Medical History: Past Medical History:  Diagnosis Date   Atrial fibrillation (HCC)    Cough    CHRONIC   Diabetes mellitus without complication (HCC)    GERD (gastroesophageal reflux disease)    History of kidney stones    Hypertension    Sleep apnea    no cpap   Tremor    HEAD    Medications:  Scheduled:   atorvastatin   40 mg Oral QHS   heparin  4,000 Units Intravenous Once   insulin  aspart  0-9 Units Subcutaneous TID WC   metoprolol  tartrate  25 mg Oral QID   Infusions:   amiodarone 60 mg/hr (03/31/24 1240)   Followed by   amiodarone     [START ON 04/01/2024] ceFEPime  (MAXIPIME ) IV     heparin     potassium chloride  10 mEq (03/31/24 1518)    Assessment: 88 yoF with hx of paroxysmal atrial fibrillation (not on anticoagulation), diabetes, hypertension, hyperlipidemia, OSA not tolerant to CPAP presenting to the hospital due to abdominal pain and poor PO intake. EKG showed A-fib RVR. Pharmacy consulted for heparin dosing.   Goal of Therapy:  Heparin level 0.3-0.7 units/ml Monitor platelets by anticoagulation protocol: Yes   Plan:  Heparin bolus 4,000 units IV x1, followed by heparin  infusion at 1,100 units/hr.  Check ~8hr heparin level.  Monitor CBC and for signs/symptoms of bleeding. Follow-up plans to transition to oral anticoagulation.    Alan Hoe, PharmD 03/31/2024 3:49 PM

## 2024-04-01 ENCOUNTER — Encounter: Payer: Self-pay | Admitting: Urology

## 2024-04-01 DIAGNOSIS — I4891 Unspecified atrial fibrillation: Secondary | ICD-10-CM | POA: Diagnosis not present

## 2024-04-01 DIAGNOSIS — N390 Urinary tract infection, site not specified: Secondary | ICD-10-CM | POA: Diagnosis not present

## 2024-04-01 DIAGNOSIS — A4151 Sepsis due to Escherichia coli [E. coli]: Secondary | ICD-10-CM | POA: Diagnosis not present

## 2024-04-01 DIAGNOSIS — I480 Paroxysmal atrial fibrillation: Secondary | ICD-10-CM | POA: Diagnosis not present

## 2024-04-01 DIAGNOSIS — A419 Sepsis, unspecified organism: Secondary | ICD-10-CM | POA: Diagnosis not present

## 2024-04-01 DIAGNOSIS — N1832 Chronic kidney disease, stage 3b: Secondary | ICD-10-CM | POA: Insufficient documentation

## 2024-04-01 DIAGNOSIS — N10 Acute tubulo-interstitial nephritis: Secondary | ICD-10-CM | POA: Diagnosis not present

## 2024-04-01 DIAGNOSIS — E663 Overweight: Secondary | ICD-10-CM | POA: Insufficient documentation

## 2024-04-01 LAB — HEPARIN LEVEL (UNFRACTIONATED)
Heparin Unfractionated: 0.28 [IU]/mL — ABNORMAL LOW (ref 0.30–0.70)
Heparin Unfractionated: 0.29 [IU]/mL — ABNORMAL LOW (ref 0.30–0.70)
Heparin Unfractionated: 0.33 [IU]/mL (ref 0.30–0.70)

## 2024-04-01 LAB — BLOOD CULTURE ID PANEL (REFLEXED) - BCID2

## 2024-04-01 LAB — COMPREHENSIVE METABOLIC PANEL WITH GFR
ALT: 24 U/L (ref 0–44)
AST: 22 U/L (ref 15–41)
Albumin: 2.5 g/dL — ABNORMAL LOW (ref 3.5–5.0)
Alkaline Phosphatase: 72 U/L (ref 38–126)
Anion gap: 8 (ref 5–15)
BUN: 37 mg/dL — ABNORMAL HIGH (ref 8–23)
CO2: 21 mmol/L — ABNORMAL LOW (ref 22–32)
Calcium: 8 mg/dL — ABNORMAL LOW (ref 8.9–10.3)
Chloride: 106 mmol/L (ref 98–111)
Creatinine, Ser: 1.68 mg/dL — ABNORMAL HIGH (ref 0.44–1.00)
GFR, Estimated: 32 mL/min — ABNORMAL LOW (ref >60)
Glucose, Bld: 261 mg/dL — ABNORMAL HIGH (ref 70–99)
Potassium: 4.4 mmol/L (ref 3.5–5.1)
Sodium: 135 mmol/L (ref 135–145)
Total Bilirubin: 0.5 mg/dL (ref 0.0–1.2)
Total Protein: 5.8 g/dL — ABNORMAL LOW (ref 6.5–8.1)

## 2024-04-01 LAB — GLUCOSE, CAPILLARY
Glucose-Capillary: 250 mg/dL — ABNORMAL HIGH (ref 70–99)
Glucose-Capillary: 233 mg/dL — ABNORMAL HIGH (ref 70–99)
Glucose-Capillary: 296 mg/dL — ABNORMAL HIGH (ref 70–99)
Glucose-Capillary: 236 mg/dL — ABNORMAL HIGH (ref 70–99)
Glucose-Capillary: 233 mg/dL — ABNORMAL HIGH (ref 70–99)

## 2024-04-01 LAB — CBC
HCT: 33.5 % — ABNORMAL LOW (ref 36.0–46.0)
Hemoglobin: 10.9 g/dL — ABNORMAL LOW (ref 12.0–15.0)
MCH: 26.1 pg (ref 26.0–34.0)
MCHC: 32.5 g/dL (ref 30.0–36.0)
MCV: 80.1 fL (ref 80.0–100.0)
Platelets: 142 10*3/uL — ABNORMAL LOW (ref 150–400)
RBC: 4.18 MIL/uL (ref 3.87–5.11)
RDW: 16.2 % — ABNORMAL HIGH (ref 11.5–15.5)
WBC: 14.7 10*3/uL — ABNORMAL HIGH (ref 4.0–10.5)
nRBC: 0 % (ref 0.0–0.2)

## 2024-04-01 MED ORDER — SODIUM CHLORIDE 0.9 % IV SOLN
2.0000 g | Freq: Two times a day (BID) | INTRAVENOUS | Status: DC
  Administered 2024-04-01 – 2024-04-02 (×2): 2 g via INTRAVENOUS
  Filled 2024-04-01 (×5): qty 12.5

## 2024-04-01 MED ORDER — HEPARIN BOLUS VIA INFUSION
1200.0000 [IU] | Freq: Once | INTRAVENOUS | Status: AC
  Administered 2024-04-01: 1200 [IU] via INTRAVENOUS
  Filled 2024-04-01: qty 1200

## 2024-04-01 NOTE — Progress Notes (Signed)
 PHARMACY - PHYSICIAN COMMUNICATION CRITICAL VALUE ALERT - BLOOD CULTURE IDENTIFICATION (BCID)  Results for orders placed or performed during the hospital encounter of 03/31/24  Resp panel by RT-PCR (RSV, Flu A&B, Covid) Urine, Catheterized     Status: None   Collection Time: 03/31/24  6:26 AM   Specimen: Urine, Catheterized; Nasal Swab  Result Value Ref Range Status   SARS Coronavirus 2 by RT PCR NEGATIVE NEGATIVE Final    Comment: (NOTE) SARS-CoV-2 target nucleic acids are NOT DETECTED.  The SARS-CoV-2 RNA is generally detectable in upper respiratory specimens during the acute phase of infection. The lowest concentration of SARS-CoV-2 viral copies this assay can detect is 138 copies/mL. A negative result does not preclude SARS-Cov-2 infection and should not be used as the sole basis for treatment or other patient management decisions. A negative result may occur with  improper specimen collection/handling, submission of specimen other than nasopharyngeal swab, presence of viral mutation(s) within the areas targeted by this assay, and inadequate number of viral copies(<138 copies/mL). A negative result must be combined with clinical observations, patient history, and epidemiological information. The expected result is Negative.  Fact Sheet for Patients:  BloggerCourse.com  Fact Sheet for Healthcare Providers:  SeriousBroker.it  This test is no t yet approved or cleared by the [place]  FDA and  has been authorized for detection and/or diagnosis of SARS-CoV-2 by FDA under an Emergency Use Authorization (EUA). This EUA will remain  in effect (meaning this test can be used) for the duration of the COVID-19 declaration under Section 564(b)(1) of the Act, 21 U.S.C.section 360bbb-3(b)(1), unless the authorization is terminated  or revoked sooner.       Influenza A by PCR NEGATIVE NEGATIVE Final   Influenza B by PCR NEGATIVE  NEGATIVE Final    Comment: (NOTE) The Xpert Xpress SARS-CoV-2/FLU/RSV plus assay is intended as an aid in the diagnosis of influenza from Nasopharyngeal swab specimens and should not be used as a sole basis for treatment. Nasal washings and aspirates are unacceptable for Xpert Xpress SARS-CoV-2/FLU/RSV testing.  Fact Sheet for Patients: BloggerCourse.com  Fact Sheet for Healthcare Providers: SeriousBroker.it  This test is not yet approved or cleared by the [place]  FDA and has been authorized for detection and/or diagnosis of SARS-CoV-2 by FDA under an Emergency Use Authorization (EUA). This EUA will remain in effect (meaning this test can be used) for the duration of the COVID-19 declaration under Section 564(b)(1) of the Act, 21 U.S.C. section 360bbb-3(b)(1), unless the authorization is terminated or revoked.     Resp Syncytial Virus by PCR NEGATIVE NEGATIVE Final    Comment: (NOTE) Fact Sheet for Patients: BloggerCourse.com  Fact Sheet for Healthcare Providers: SeriousBroker.it  This test is not yet approved or cleared by the [place]  FDA and has been authorized for detection and/or diagnosis of SARS-CoV-2 by FDA under an Emergency Use Authorization (EUA). This EUA will remain in effect (meaning this test can be used) for the duration of the COVID-19 declaration under Section 564(b)(1) of the Act, 21 U.S.C. section 360bbb-3(b)(1), unless the authorization is terminated or revoked.  Performed at [facility], [address] [place], [place] [address]   Culture, blood (routine x 2)     Status: None (Preliminary result)   Collection Time: 03/31/24  6:29 AM   Specimen: BLOOD  Result Value Ref Range Status   Specimen Description BLOOD LEFT ARM  Final   Special Requests   Final    BOTTLES DRAWN AEROBIC AND ANAEROBIC  Blood Culture results may not be  optimal due to an inadequate volume of blood received in culture bottles   Culture  Setup Time   Final    GRAM NEGATIVE RODS AEROBIC BOTTLE ONLY Organism ID to follow CRITICAL RESULT CALLED TO, READ BACK BY AND VERIFIED WITH: RANKIN DILLS, PHARMD @0130  04/01/2024 COP Performed at [facility], [address] [place], [place] [address]    Culture GRAM NEGATIVE RODS  Final   Report Status PENDING  Incomplete  Blood Culture ID Panel (Reflexed)     Status: Abnormal   Collection Time: 03/31/24  6:29 AM  Result Value Ref Range Status   Enterococcus faecalis NOT DETECTED NOT DETECTED Final   Enterococcus Faecium NOT DETECTED NOT DETECTED Final   Listeria monocytogenes NOT DETECTED NOT DETECTED Final   Staphylococcus species NOT DETECTED NOT DETECTED Final   Staphylococcus aureus (BCID) NOT DETECTED NOT DETECTED Final   Staphylococcus epidermidis NOT DETECTED NOT DETECTED Final   Staphylococcus lugdunensis NOT DETECTED NOT DETECTED Final   Streptococcus species NOT DETECTED NOT DETECTED Final   Streptococcus agalactiae NOT DETECTED NOT DETECTED Final   Streptococcus pneumoniae NOT DETECTED NOT DETECTED Final   Streptococcus pyogenes NOT DETECTED NOT DETECTED Final   A.calcoaceticus-baumannii NOT DETECTED NOT DETECTED Final   Bacteroides fragilis NOT DETECTED NOT DETECTED Final   Enterobacterales DETECTED (A) NOT DETECTED Final    Comment: Enterobacterales represent a large order of gram negative bacteria, not a single organism. CRITICAL RESULT CALLED TO, READ BACK BY AND VERIFIED WITH: Mazell Aylesworth, PHARMD @0130  04/01/2024 COP    Enterobacter cloacae complex NOT DETECTED NOT DETECTED Final   Escherichia coli DETECTED (A) NOT DETECTED Final    Comment: CRITICAL RESULT CALLED TO, READ BACK BY AND VERIFIED WITH: Prajwal Fellner, PHARMD @0130  04/01/2024 COP    Klebsiella aerogenes NOT DETECTED NOT DETECTED Final   Klebsiella oxytoca NOT DETECTED NOT DETECTED Final   Klebsiella  pneumoniae NOT DETECTED NOT DETECTED Final   Proteus species NOT DETECTED NOT DETECTED Final   Salmonella species NOT DETECTED NOT DETECTED Final   Serratia marcescens NOT DETECTED NOT DETECTED Final   Haemophilus influenzae NOT DETECTED NOT DETECTED Final   Neisseria meningitidis NOT DETECTED NOT DETECTED Final   Pseudomonas aeruginosa NOT DETECTED NOT DETECTED Final   Stenotrophomonas maltophilia NOT DETECTED NOT DETECTED Final   Candida albicans NOT DETECTED NOT DETECTED Final   Candida auris NOT DETECTED NOT DETECTED Final   Candida glabrata NOT DETECTED NOT DETECTED Final   Candida krusei NOT DETECTED NOT DETECTED Final   Candida parapsilosis NOT DETECTED NOT DETECTED Final   Candida tropicalis NOT DETECTED NOT DETECTED Final   Cryptococcus neoformans/gattii NOT DETECTED NOT DETECTED Final   CTX-M ESBL NOT DETECTED NOT DETECTED Final   Carbapenem resistance IMP NOT DETECTED NOT DETECTED Final   Carbapenem resistance KPC NOT DETECTED NOT DETECTED Final   Carbapenem resistance NDM NOT DETECTED NOT DETECTED Final   Carbapenem resist OXA 48 LIKE NOT DETECTED NOT DETECTED Final   Carbapenem resistance VIM NOT DETECTED NOT DETECTED Final    Comment: Performed at [facility], [address] [place], [place] [address]    BCID Results: 1 (aerobic) of 4 bottles w/ E coli, no resistance.  Pt currently on Cefepime .  Name of provider contacted: DOROTHA Peaches, MD.   Changes to prescribed antibiotics required: No changes at this time,   RANKIN CANDIE DILLS, PharmD, [facility] 04/01/2024 3:07 AM

## 2024-04-01 NOTE — Progress Notes (Signed)
 Progress Note   [name] FMW:[id number] DOB: 11/13/1950 DOA: 03/31/2024     1 DOS: the patient was seen and examined on 04/01/2024   Brief hospital course: Cynthia Lucas is a 73 y.o. female with medical history significant of  Atrial fibrillation , DMII, GERD, renal stones, Hypertension , OSA not on CPAP, essential tremor , who presents BIB EMS due to failure to thrive at home after son death with family nothing patient appears to be depressed and will not complete her ADLS and has decrease appetite. Upon arrival, patient had tachycardia, tachypnea.  Significant leukocytosis.  Lactic acid level was 4.9. CT scan showed a right-sided kidney stone with hydronephrosis. Patient was seen by urology, ureteral stent was placed.  Blood cultures so far has grew E. coli, she is currently covered with cefepime .   Principal Problem:   Sepsis secondary to UTI [facility]) Active Problems:   Obstructive uropathy   Metabolic acidosis   Paroxysmal atrial fibrillation with RVR (HCC)   Uncontrolled type 2 diabetes mellitus with hyperglycemia, without long-term current use of insulin  (HCC)   Severe sepsis (HCC)   Acute pyelonephritis   Right kidney stone   CKD stage 3b, GFR 30-44 ml/min (HCC)   E. coli septicemia (HCC)   Overweight (BMI 25.0-29.9)   Assessment and Plan: Severe sepsis secondary to pyelonephritis E. coli septicemia secondary to pyelonephritis. Acute right sided pyelonephritis secondary to hydronephrosis. Right sided kidney stone with obstruction and hydronephrosis. Patient met severe sepsis criteria with tachycardia, tachypnea, leukocytosis, severe lactic acidosis.  This is a secondary to pyelonephritis. Blood cultures so far growing E. coli.  Urine culture still pending. Patient currently is hemodynamic stable, will continue cefepime .  Paroxysmal atrial fibrillation with RVR. Elevated troponin secondary to sepsis Patient developed RVR due to severe sepsis. Seen by  cardiology, currently on amiodarone drip and heparin.  Heart rate is better controlled.  Chronic kidney disease stage IIIb. Mild metabolic acidosis. Hypokalemia resolved. Acute kidney injury ruled out. Reviewed prior labs, patient had chronic kidney disease, no acute exacerbation. Patient has mild metabolic acidosis, will start sodium bicarb  Uncontrolled type 2 diabetes with hyperglycemia Continue sliding insulin  for now.  Overweight. Diet and excise.  Tremor, possibility of Parkinson disease. Patient has essential tremor, this can be followed with PCP and refer to neurology as outpatient. Patient also has significant weakness, obtain PT/OT     Subjective:  Patient feels tired, denies any short of breath or cough.  No fever or chills.  Physical Exam: Vitals:   04/01/24 0700 04/01/24 0800 04/01/24 0900 04/01/24 0947  BP: 97/79 98/77 119/82   Pulse: 86  (!) 56   Resp: 19 19 20 19   Temp:  97.9 F (36.6 C)    TempSrc:      SpO2: 100%  93%   Weight:      Height:       General exam: Appears calm and comfortable  Respiratory system: Clear to auscultation. Respiratory effort normal. Cardiovascular system: Irregular. No JVD, murmurs, rubs, gallops or clicks. No pedal edema. Gastrointestinal system: Abdomen is nondistended, soft and nontender. No organomegaly or masses felt. Normal bowel sounds heard. Central nervous system: Alert and oriented.  Tremor Extremities: Symmetric 5 x 5 power. Skin: No rashes, lesions or ulcers Psychiatry: Depressed mood.   Data Reviewed:  CT scan and lab results reviewed.  Family Communication: None  Disposition: Status is: Inpatient Remains inpatient appropriate because: Disease severity, IV treatment.     Time spent: 60 minutes  Author:  Murvin Mana, MD 04/01/2024 11:39 AM  For on call review [email or web address].

## 2024-04-01 NOTE — Inpatient Diabetes Management (Signed)
 Inpatient Diabetes Program Recommendations  AACE/ADA: New Consensus Statement on Inpatient Glycemic Control (2015)  Target Ranges:  Prepandial:   less than 140 mg/dL      Peak postprandial:   less than 180 mg/dL (1-2 hours)      Critically ill patients:  140 - 180 mg/dL    Latest Reference Range & Units 04/01/24 07:38 04/01/24 11:23  Glucose-Capillary 70 - 99 mg/dL 766 (H)  3 units Novolog   296 (H)  5 units Novolog    (H): Data is abnormally high    Home DM Meds: Jardiance  25 mg daily + Tradjenta 5mg  daily  Current Orders: Novolog  0-9 units TID    Of note, pt got 10 mg Decadron  X 1 dose yest at 10am for Surgery (underwent Cystoscopy/Stent placement)    MD- Note CBGs >200 today Poor PO intake so far May consider increasing the Novolog  SSI to the 0-15 unit Moderate scale Q4H    --Will follow patient during hospitalization--  Adina Rudolpho Arrow RN, MSN, CDCES Diabetes Coordinator Inpatient Glycemic Control Team Team Pager: [phone] (8a-5p)

## 2024-04-01 NOTE — Evaluation (Signed)
 Physical Therapy Evaluation Patient Details Name: Cynthia Lucas MRN: [id number] DOB: [birth year] Today's Date: 04/01/2024  History of Present Illness  Pt admitted for sepsis secondary to UTI with complaints of depression after losing her son, now with decreased mobility/weakness. Pt s/p cystoscopy with stent placed on 03/31/24. PMH includes Afib, DM, HTN, and tremors. Currently on amio drip, cleared to work with her via [employer] fundraiser.  Clinical Impression  Pt is a pleasant 73  year old female who was admitted for sepsis secondary to UTI. Pt performs bed mobility with supervision, transfers with CGA, and ambulation with CGA and +2 secondary to poor gait quality and balance concerns. Currently recommending to use RW for all mobility due to risk for falls and baseline tremors. Pt demonstrates deficits with B LE strength/mobility/balance. Would benefit from skilled PT to address above deficits and promote optimal return to PLOF. Pt will continue to receive skilled PT services while admitted and will defer to TOC/care team for updates regarding disposition planning.  Of note, received pt order while pt admitted to CCU. Pt then moved to [facility] where evaluation took place. Unfortunately, that particular unit not equipped to handle amio drip and pt returned back to CCU. Discussed with MD and cleared to continue treatment sessions for therapy.        If plan is discharge home, recommend the following: A little help with walking and/or transfers;Assist for transportation;Help with stairs or ramp for entrance   Can travel by private vehicle        Equipment Recommendations BSC/3in1  Recommendations for Other Services       Functional Status Assessment Patient has had a recent decline in their functional status and demonstrates the ability to make significant improvements in function in a reasonable and predictable amount of time.     Precautions / Restrictions Precautions Precautions: Fall Recall of  Precautions/Restrictions: Intact Restrictions Weight Bearing Restrictions Per Provider Order: No      Mobility  Bed Mobility Overal bed mobility: Needs Assistance Bed Mobility: Supine to Sit     Supine to sit: Supervision     General bed mobility comments: safe technique with ease of mobility    Transfers Overall transfer level: Needs assistance Equipment used: None Transfers: Sit to/from Stand Sit to Stand: Contact guard assist           General transfer comment: takes several attempts due to weakness/balance deficits. Once standing, slightly unsteady. Performed pre gait trial without device.    Ambulation/Gait Ambulation/Gait assistance: Contact guard assist, +2 safety/equipment Gait Distance (Feet): 60 Feet Assistive device: Rolling walker (2 wheels) Gait Pattern/deviations: Step-to pattern, Ataxic, Narrow base of support       General Gait Details: ambulated short distance without AD, with increased in balance deficits. Further mobility performed with RW noted with tandem stepping and 1 LOB requiring min assist for recovery due to foot cross over. Verbal cues given for increased BOS during ambulation.  Stairs            Wheelchair Mobility     Tilt Bed    Modified Rankin (Stroke Patients Only)       Balance Overall balance assessment: Needs assistance Sitting-balance support: Feet supported Sitting balance-Leahy Scale: Fair     Standing balance support: No upper extremity supported Standing balance-Leahy Scale: Poor                               Pertinent Vitals/Pain Pain Assessment Pain  Assessment: No/denies pain    Home Living Family/patient expects to be discharged to:: Private residence Living Arrangements: Other relatives (sister) Available Help at Discharge: Family;Available 24 hours/day Type of Home: House Home Access: Ramped entrance     Alternate Level Stairs-Number of Steps: 6 Home Layout: Multi-level Home  Equipment: Rolling Walker (2 wheels);Crutches      Prior Function Prior Level of Function : Independent/Modified Independent;History of Falls (last six months)             Mobility Comments: reports she mainly stays at home, however when the sister goes to the store, she will often go with her. Reports 1 recent fall ADLs Comments: indep     Extremity/Trunk Assessment   Upper Extremity Assessment Upper Extremity Assessment: Generalized weakness (constant tremors)    Lower Extremity Assessment Lower Extremity Assessment: Generalized weakness       Communication   Communication Communication: No apparent difficulties    Cognition Arousal: Alert Behavior During Therapy: WFL for tasks assessed/performed   PT - Cognitive impairments: No apparent impairments                       PT - Cognition Comments: A&O x 4, however appears somewhat limited in her responses in regards to safety and her baseline status. Following commands: Intact       Cueing Cueing Techniques: Verbal cues     General Comments General comments (skin integrity, edema, etc.): SpO2 91% on RA with activity, 96% at rest    Exercises     Assessment/Plan    PT Assessment Patient needs continued PT services  PT Problem List Decreased strength;Decreased activity tolerance;Decreased balance;Decreased mobility;Decreased knowledge of use of DME       PT Treatment Interventions DME instruction;Gait training;Therapeutic activities;Therapeutic exercise    PT Goals (Current goals can be found in the Care Plan section)  Acute Rehab PT Goals Patient Stated Goal: to go home PT Goal Formulation: With patient Time For Goal Achievement: 04/15/24 Potential to Achieve Goals: Good    Frequency Min 1X/week     Co-evaluation               AM-PAC PT 6 Clicks Mobility  Outcome Measure Help needed turning from your back to your side while in a flat bed without using bedrails?: A Little Help  needed moving from lying on your back to sitting on the side of a flat bed without using bedrails?: A Little Help needed moving to and from a bed to a chair (including a wheelchair)?: A Little Help needed standing up from a chair using your arms (e.g., wheelchair or bedside chair)?: A Little Help needed to walk in hospital room?: A Little Help needed climbing 3-5 steps with a railing? : A Little 6 Click Score: 18    End of Session   Activity Tolerance: Patient tolerated treatment well Patient left: in chair;with chair alarm set Nurse Communication: Mobility status PT Visit Diagnosis: Difficulty in walking, not elsewhere classified (R26.2);Unsteadiness on feet (R26.81);Ataxic gait (R26.0);Muscle weakness (generalized) (M62.81)    Time: 8968-8946 PT Time Calculation (min) (ACUTE ONLY): 22 min   Charges:   PT Evaluation $PT Eval Low Complexity: 1 Low   PT General Charges $$ ACUTE PT VISIT: 1 Visit         Corean Dade, PT, DPT, GCS [phone]   Lil Lepage 04/01/2024, 1:50 PM

## 2024-04-01 NOTE — Progress Notes (Signed)
 PHARMACY - ANTICOAGULATION CONSULT NOTE  Pharmacy Consult for Heparin Indication: atrial fibrillation  Allergies  Allergen Reactions   Nickel Itching and Swelling   Other     Contact lense solution causes eye irritation    Patient Measurements: Height: 5' 7 (170.2 cm) Weight: 79.3 kg (174 lb 12.8 oz) IBW/kg (Calculated) : 61.6 HEPARIN DW (KG): 77.7  Vital Signs: Temp: 97.7 F (36.5 C) (06/30 0106) Temp Source: Oral (06/30 0106) BP: 100/88 (06/30 0100) Pulse Rate: 36 (06/30 0100)  Labs: Recent Labs    03/31/24 0629 03/31/24 0815 03/31/24 1147 04/01/24 0058  HGB 13.0  --   --   --   HCT 39.7  --   --   --   PLT 161  --   --   --   HEPARINUNFRC  --   --   --  0.28*  CREATININE 2.64*  --  1.98*  --   TROPONINIHS 88* 50*  --   --     Estimated Creatinine Clearance: 27.4 mL/min (A) (by C-G formula based on SCr of 1.98 mg/dL (H)).   Medical History: Past Medical History:  Diagnosis Date   Atrial fibrillation (HCC)    Cough    CHRONIC   Diabetes mellitus without complication (HCC)    GERD (gastroesophageal reflux disease)    History of kidney stones    Hypertension    Sleep apnea    no cpap   Tremor    HEAD    Medications:  Scheduled:   atorvastatin   40 mg Oral QHS   Chlorhexidine  Gluconate Cloth  6 each Topical QHS   insulin  aspart  0-9 Units Subcutaneous TID WC   metoprolol  tartrate  25 mg Oral QID   Infusions:   amiodarone 30 mg/hr (04/01/24 0006)   ceFEPime  (MAXIPIME ) IV     heparin 1,100 Units/hr (04/01/24 0000)    Assessment: 75 yoF with hx of paroxysmal atrial fibrillation (not on anticoagulation), diabetes, hypertension, hyperlipidemia, OSA not tolerant to CPAP presenting to the hospital due to abdominal pain and poor PO intake. EKG showed A-fib RVR. Pharmacy consulted for heparin dosing.   Goal of Therapy:  Heparin level 0.3-0.7 units/ml Monitor platelets by anticoagulation protocol: Yes   Plan:  Heparin bolus 1200 units IV x  1 Increase heparin infusion to 1,250 units/hr.  Check ~8hr heparin level.  Monitor CBC and for signs/symptoms of bleeding. Follow-up plans to transition to oral anticoagulation.   Rankin CANDIE Dills, PharmD, MBA 04/01/2024 1:46 AM

## 2024-04-01 NOTE — Progress Notes (Signed)
 PHARMACY - ANTICOAGULATION CONSULT NOTE  Pharmacy Consult for Heparin Indication: atrial fibrillation  Allergies  Allergen Reactions   Nickel Itching and Swelling   Other     Contact lense solution causes eye irritation    Patient Measurements: Height: 5' 7 (170.2 cm) Weight: 79.3 kg (174 lb 12.8 oz) IBW/kg (Calculated) : 61.6 HEPARIN DW (KG): 77.7  Vital Signs: Temp: 98.6 F (37 C) (06/30 1600) Temp Source: Oral (06/30 1600) BP: 120/74 (06/30 2100) Pulse Rate: 102 (06/30 2100)  Labs: Recent Labs    03/31/24 0629 03/31/24 0815 03/31/24 1147 04/01/24 0058 04/01/24 0446 04/01/24 1131 04/01/24 2037  HGB 13.0  --   --   --  10.9*  --   --   HCT 39.7  --   --   --  33.5*  --   --   PLT 161  --   --   --  142*  --   --   HEPARINUNFRC  --   --   --  0.28*  --  0.29* 0.33  CREATININE 2.64*  --  1.98*  --  1.68*  --   --   TROPONINIHS 88* 50*  --   --   --   --   --     Estimated Creatinine Clearance: 32.3 mL/min (A) (by C-G formula based on SCr of 1.68 mg/dL (H)).   Medical History: Past Medical History:  Diagnosis Date   Atrial fibrillation (HCC)    Cough    CHRONIC   Diabetes mellitus without complication (HCC)    GERD (gastroesophageal reflux disease)    History of kidney stones    Hypertension    Sleep apnea    no cpap   Tremor    HEAD    Medications:  Scheduled:   atorvastatin   40 mg Oral QHS   Chlorhexidine  Gluconate Cloth  6 each Topical QHS   insulin  aspart  0-9 Units Subcutaneous TID WC   metoprolol  tartrate  25 mg Oral QID   Infusions:   amiodarone 30 mg/hr (04/01/24 1746)   ceFEPime  (MAXIPIME ) IV 2 g (04/01/24 1746)   heparin 1,400 Units/hr (04/01/24 1746)    Assessment: 42 yoF with hx of paroxysmal atrial fibrillation (not on anticoagulation), diabetes, hypertension, hyperlipidemia, OSA not tolerant to CPAP presenting to the hospital due to abdominal pain and poor PO intake. EKG showed A-fib RVR. Pharmacy consulted for heparin dosing.    Goal of Therapy:  Heparin level 0.3-0.7 units/ml Monitor platelets by anticoagulation protocol: Yes   6/30@1131 : HL 0.29, SUBtherapeutic@1250  units/hr 6/30@2037 : HL 0.33, therapeutic x1  Plan:  Heparin level this evening is subtherapeutic but at low end of goal range at 0.33 on 1400 units/hour Increase heparin infusion slightly to 1450 units/hr to keep heparin levels in goal range Check heparin level in 8 hours to confirm Monitor CBC and for signs/symptoms of bleeding. Follow-up plans to transition to oral anticoagulation.   Thank you for involving pharmacy in this patient's care.   Damien Napoleon, PharmD Clinical Pharmacist 04/01/2024 9:09 PM

## 2024-04-01 NOTE — Evaluation (Signed)
 Occupational Therapy Evaluation Patient Details Name: Cynthia Lucas MRN: [id number] DOB: [birth year] Today's Date: 04/01/2024   History of Present Illness   Pt admitted for sepsis secondary to UTI with complaints of depression after losing her son, now with decreased mobility/weakness. Pt s/p cystoscopy with stent placed on 03/31/24. PMH includes Afib, DM, HTN, and tremors. Currently on amio drip, cleared to work with her via Char[employer] fundraiser.    Clinical Impressions Cynthia Lucas was seen for OT evaluation this date. Prior to hospital admission, pt was IND limited community ambulator. Pt lives with sister in split level home. Pt currently requires SETUP tooth brushing in sitting, assist 2/2 tremor. CGA + RW for ADL t/f ~100 ft. SpO2 91% on RA with activity, 96% at rest. Pt would benefit from skilled OT to address noted impairments and functional limitations (see below for any additional details). Upon hospital discharge, recommend OT follow up.    If plan is discharge home, recommend the following:   Help with stairs or ramp for entrance     Functional Status Assessment   Patient has had a recent decline in their functional status and demonstrates the ability to make significant improvements in function in a reasonable and predictable amount of time.     Equipment Recommendations   BSC/3in1     Recommendations for Other Services         Precautions/Restrictions   Precautions Precautions: Fall Recall of Precautions/Restrictions: Intact Restrictions Weight Bearing Restrictions Per Provider Order: No     Mobility Bed Mobility Overal bed mobility: Needs Assistance Bed Mobility: Supine to Sit     Supine to sit: Supervision          Transfers Overall transfer level: Needs assistance Equipment used: None Transfers: Sit to/from Stand Sit to Stand: Contact guard assist                  Balance Overall balance assessment: Needs assistance Sitting-balance support: No upper  extremity supported, Feet supported Sitting balance-Leahy Scale: Fair     Standing balance support: No upper extremity supported, During functional activity Standing balance-Leahy Scale: Poor                             ADL either performed or assessed with clinical judgement   ADL Overall ADL's : Needs assistance/impaired                                       General ADL Comments: SETUP tooth brushing in sitting, assist 2/2 tremor. CGA + RW for ADL t/f ~100 ft. SpO2 91% on RA with activity, 96% at rest     Vision         Perception         Praxis         Pertinent Vitals/Pain Pain Assessment Pain Assessment: No/denies pain     Extremity/Trunk Assessment Upper Extremity Assessment Upper Extremity Assessment: Generalized weakness (constant tremors)   Lower Extremity Assessment Lower Extremity Assessment: Generalized weakness       Communication Communication Communication: No apparent difficulties   Cognition Arousal: Alert Behavior During Therapy: WFL for tasks assessed/performed Cognition: No apparent impairments                               Following commands: Intact  Cueing  General Comments   Cueing Techniques: Verbal cues  SpO2 91% on RA with activity, 96% at rest   Exercises     Shoulder Instructions      Home Living Family/patient expects to be discharged to:: Private residence Living Arrangements: Other relatives (sister) Available Help at Discharge: Family;Available 24 hours/day Type of Home: House Home Access: Ramped entrance     Home Layout: Multi-level Alternate Level Stairs-Number of Steps: 6 Alternate Level Stairs-Rails: Can reach both Bathroom Shower/Tub: Tub/shower unit         Home Equipment: Agricultural consultant (2 wheels);Crutches          Prior Functioning/Environment Prior Level of Function : Independent/Modified Independent;History of Falls (last six months)              Mobility Comments: reports she mainly stays at home, however when the sister goes to the store, she will often go with her. Reports 1 recent fall ADLs Comments: indep    OT Problem List: Decreased activity tolerance;Decreased strength;Decreased range of motion;Impaired balance (sitting and/or standing)   OT Treatment/Interventions: Self-care/ADL training;Therapeutic exercise;Energy conservation;DME and/or AE instruction;Therapeutic activities;Patient/family education      OT Goals(Current goals can be found in the care plan section)   Acute Rehab OT Goals Patient Stated Goal: to go home OT Goal Formulation: With patient Time For Goal Achievement: 04/15/24 Potential to Achieve Goals: Good ADL Goals Pt Will Perform Grooming: with set-up;with supervision;standing Pt Will Perform Lower Body Dressing: with modified independence;sit to/from stand Pt Will Transfer to Toilet: with modified independence;ambulating;regular height toilet   OT Frequency:  Min 2X/week    Co-evaluation              AM-PAC OT 6 Clicks Daily Activity     Outcome Measure Help from another person eating meals?: A Little Help from another person taking care of personal grooming?: None Help from another person toileting, which includes using toliet, bedpan, or urinal?: None Help from another person bathing (including washing, rinsing, drying)?: A Little Help from another person to put on and taking off regular upper body clothing?: None Help from another person to put on and taking off regular lower body clothing?: A Little 6 Click Score: 21   End of Session Equipment Utilized During Treatment: Rolling walker (2 wheels) Nurse Communication: Mobility status  Activity Tolerance: Patient tolerated treatment well Patient left: in chair;with call bell/phone within reach;with chair alarm set;with nursing/sitter in room  OT Visit Diagnosis: Other abnormalities of gait and mobility (R26.89);Muscle  weakness (generalized) (M62.81)                Time: 8968-8942 OT Time Calculation (min): 26 min Charges:  OT General Charges $OT Visit: 1 Visit OT Evaluation $OT Eval Moderate Complexity: 1 Mod OT Treatments $Self Care/Home Management : 8-22 mins  Cynthia Lucas, M.S. OTR/L  04/01/24, 12:18 PM  ascom [phone]

## 2024-04-01 NOTE — Progress Notes (Signed)
 Rounding Note   Patient Name: Cynthia Lucas Date of Encounter: 04/01/2024  [facility] Cardiologist: Deatrice Cage, MD   Subjective  She continues to be in atrial fibrillation with mild tachycardia in spite of amiodarone drip and oral metoprolol .  She denies chest pain or shortness of breath.  Scheduled Meds:  atorvastatin   40 mg Oral QHS   Chlorhexidine  Gluconate Cloth  6 each Topical QHS   insulin  aspart  0-9 Units Subcutaneous TID WC   metoprolol  tartrate  25 mg Oral QID   Continuous Infusions:  amiodarone 30 mg/hr (04/01/24 1226)   ceFEPime  (MAXIPIME ) IV     heparin 1,400 Units/hr (04/01/24 1226)   PRN Meds: albuterol, fentaNYL  (SUBLIMAZE ) injection, ondansetron  **OR** ondansetron  (ZOFRAN ) IV   Vital Signs  Vitals:   04/01/24 0800 04/01/24 0900 04/01/24 0947 04/01/24 1400  BP: 98/77 119/82  110/82  Pulse:  (!) 56  (!) 105  Resp: 19 20 19  (!) 23  Temp: 97.9 F (36.6 C)   98.2 F (36.8 C)  TempSrc:      SpO2:  93%  95%  Weight:      Height:        Intake/Output Summary (Last 24 hours) at 04/01/2024 1525 Last data filed at 04/01/2024 0600 Gross per 24 hour  Intake 1703.38 ml  Output 1200 ml  Net 503.38 ml      03/31/2024    6:15 AM 11/01/2022   10:18 AM 04/26/2022    3:13 PM  Last 3 Weights  Weight (lbs) 174 lb 12.8 oz 213 lb 4 oz 202 lb  Weight (kg) 79.289 kg 96.73 kg 91.627 kg      Telemetry Atrial fibrillation with ventricular rates ranging from 80 to 120 bpm.- Personally Reviewed  ECG   - Personally Reviewed  Physical Exam  GEN: No acute distress.   Neck: No JVD Cardiac: Irregularly irregular, no murmurs, rubs, or gallops.  Respiratory: Clear to auscultation bilaterally. GI: Soft, nontender, non-distended  MS: No edema; No deformity. Neuro:  Nonfocal  Psych: Normal affect   Labs High Sensitivity Troponin:   Recent Labs  Lab 03/31/24 0629 03/31/24 0815  TROPONINIHS 88* 50*     Chemistry Recent Labs  Lab 03/31/24 0629  03/31/24 1147 04/01/24 0446  NA 135 138 135  K 2.9* 3.2* 4.4  CL 100 106 106  CO2 22 21* 21*  GLUCOSE 359* 209* 261*  BUN 48* 38* 37*  CREATININE 2.64* 1.98* 1.68*  CALCIUM  8.3* 7.8* 8.0*  MG 2.0 1.9  --   PROT 6.7  --  5.8*  ALBUMIN 3.1*  --  2.5*  AST 60*  --  22  ALT 31  --  24  ALKPHOS 72  --  72  BILITOT 1.0  --  0.5  GFRNONAA 19* 26* 32*  ANIONGAP 13 11 8     Lipids No results for input(s): CHOL, TRIG, HDL, LABVLDL, LDLCALC, CHOLHDL in the last 168 hours.  Hematology Recent Labs  Lab 03/31/24 0629 04/01/24 0446  WBC 17.4* 14.7*  RBC 4.95 4.18  HGB 13.0 10.9*  HCT 39.7 33.5*  MCV 80.2 80.1  MCH 26.3 26.1  MCHC 32.7 32.5  RDW 15.9* 16.2*  PLT 161 142*   Thyroid No results for input(s): TSH, FREET4 in the last 168 hours.  BNPNo results for input(s): BNP, PROBNP in the last 168 hours.  DDimer No results for input(s): DDIMER in the last 168 hours.   Radiology  ECHOCARDIOGRAM COMPLETE Result Date: 03/31/2024  ECHOCARDIOGRAM REPORT   Patient Name:   Cynthia Lucas Date of Exam: 03/31/2024 Medical Rec #:  [id number]     Height:       67.0 in Accession #:    [id number]    Weight:       174.8 lb Date of Birth:  1951-06-18     BSA:          1.910 m Patient Age:    73 years      BP:           128/53 mmHg Patient Gender: F             HR:           113 bpm. Exam Location:  [facility] Procedure: 2D Echo, Color Doppler and Cardiac Doppler (Both Spectral and Color            Flow Doppler were utilized during procedure). Indications:     Elevated Troponin  History:         Patient has prior history of Echocardiogram examinations.                  Arrythmias:Atrial Fibrillation; Risk Factors:Diabetes and                  Hypertension.  Sonographer:     L. Thornton-Maynard Referring Phys:  [id number] CAMILA A THOMAS Diagnosing Phys: Redell Cave MD  Sonographer Comments: Suboptimal apical window. IMPRESSIONS  1. Left ventricular ejection fraction, by estimation, is 55  to 60%. The left ventricle has normal function. The left ventricle has no regional wall motion abnormalities. There is mild left ventricular hypertrophy. Left ventricular diastolic parameters are indeterminate.  2. Right ventricular systolic function is normal. The right ventricular size is normal. There is normal pulmonary artery systolic pressure.  3. The mitral valve is normal in structure. Mild mitral valve regurgitation.  4. The aortic valve is tricuspid. Aortic valve regurgitation is mild.  5. Aortic dilatation noted. There is mild dilatation of the ascending aorta, measuring 44 mm.  6. The inferior vena cava is normal in size with greater than 50% respiratory variability, suggesting right atrial pressure of 3 mmHg. FINDINGS  Left Ventricle: Left ventricular ejection fraction, by estimation, is 55 to 60%. The left ventricle has normal function. The left ventricle has no regional wall motion abnormalities. The left ventricular internal cavity size was normal in size. There is  mild left ventricular hypertrophy. Left ventricular diastolic parameters are indeterminate. Right Ventricle: The right ventricular size is normal. No increase in right ventricular wall thickness. Right ventricular systolic function is normal. There is normal pulmonary artery systolic pressure. The tricuspid regurgitant velocity is 2.68 m/s, and  with an assumed right atrial pressure of 3 mmHg, the estimated right ventricular systolic pressure is 31.7 mmHg. Left Atrium: Left atrial size was normal in size. Right Atrium: Right atrial size was normal in size. Pericardium: There is no evidence of pericardial effusion. Mitral Valve: The mitral valve is normal in structure. Mild mitral valve regurgitation. MV peak gradient, 3.9 mmHg. The mean mitral valve gradient is 2.0 mmHg. Tricuspid Valve: The tricuspid valve is normal in structure. Tricuspid valve regurgitation is mild. Aortic Valve: The aortic valve is tricuspid. Aortic valve  regurgitation is mild. Aortic regurgitation PHT measures 309 msec. Aortic valve mean gradient measures 5.3 mmHg. Aortic valve peak gradient measures 10.1 mmHg. Aortic valve area, by VTI measures 2.07 cm. Pulmonic Valve: The pulmonic valve was normal in structure. Pulmonic valve regurgitation is  mild. Aorta: Aortic dilatation noted. There is mild dilatation of the ascending aorta, measuring 44 mm. Venous: The inferior vena cava is normal in size with greater than 50% respiratory variability, suggesting right atrial pressure of 3 mmHg. IAS/Shunts: No atrial level shunt detected by color flow Doppler.  LEFT VENTRICLE PLAX 2D LVIDd:         3.80 cm     Diastology LVIDs:         3.30 cm     LV e' medial:    10.50 cm/s LV PW:         1.30 cm     LV E/e' medial:  8.0 LV IVS:        1.40 cm     LV e' lateral:   10.50 cm/s LVOT diam:     1.90 cm     LV E/e' lateral: 8.0 LV SV:         43 LV SV Index:   23 LVOT Area:     2.84 cm  LV Volumes (MOD) LV vol d, MOD A2C: 60.2 ml LV vol d, MOD A4C: 59.6 ml LV vol s, MOD A2C: 25.6 ml LV vol s, MOD A4C: 15.6 ml LV SV MOD A2C:     34.6 ml LV SV MOD A4C:     59.6 ml LV SV MOD BP:      41.0 ml RIGHT VENTRICLE             IVC RV Basal diam:  3.00 cm     IVC diam: 1.00 cm RV S prime:     11.85 cm/s TAPSE (M-mode): 1.4 cm LEFT ATRIUM             Index        RIGHT ATRIUM           Index LA diam:        3.70 cm 1.94 cm/m   RA Area:     14.50 cm LA Vol (A2C):   32.8 ml 17.17 ml/m  RA Volume:   34.00 ml  17.80 ml/m LA Vol (A4C):   52.3 ml 27.39 ml/m LA Biplane Vol: 42.5 ml 22.25 ml/m  AORTIC VALVE                     PULMONIC VALVE AV Area (Vmax):    2.04 cm      PV Vmax:          1.35 m/s AV Area (Vmean):   2.07 cm      PV Peak grad:     7.3 mmHg AV Area (VTI):     2.07 cm      PR End Diast Vel: 8.47 msec AV Vmax:           159.00 cm/s AV Vmean:          107.167 cm/s AV VTI:            0.210 m AV Peak Grad:      10.1 mmHg AV Mean Grad:      5.3 mmHg LVOT Vmax:         114.67 cm/s  LVOT Vmean:        78.100 cm/s LVOT VTI:          0.153 m LVOT/AV VTI ratio: 0.73 AI PHT:            309 msec  AORTA Ao Root diam: 3.10 cm Ao Asc diam:  4.40 cm MITRAL VALVE  TRICUSPID VALVE MV Area (PHT): 8.29 cm    TR Peak grad:   28.7 mmHg MV Area VTI:   4.59 cm    TR Vmax:        268.00 cm/s MV Peak grad:  3.9 mmHg MV Mean grad:  2.0 mmHg    SHUNTS MV Vmax:       0.99 m/s    Systemic VTI:  0.15 m MV Vmean:      63.0 cm/s   Systemic Diam: 1.90 cm MV Decel Time: 92 msec MV E velocity: 83.85 cm/s Redell Cave MD Electronically signed by Redell Cave MD Signature Date/Time: 03/31/2024/1:08:57 PM    Final    DG OR UROLOGY CYSTO IMAGE ([facility] ONLY) Result Date: 03/31/2024 There is no interpretation for this exam.  This order is for images obtained during a surgical procedure.  Please See Surgeries Tab for more information regarding the procedure.   CT ABDOMEN PELVIS WO CONTRAST Result Date: 03/31/2024 CLINICAL DATA:  73 year old female with weakness, decreased p.o., abdominal pain. EXAM: CT ABDOMEN AND PELVIS WITHOUT CONTRAST TECHNIQUE: Multidetector CT imaging of the abdomen and pelvis was performed following the standard protocol without IV contrast. RADIATION DOSE REDUCTION: This exam was performed according to the departmental dose-optimization program which includes automated exposure control, adjustment of the mA and/or kV according to patient size and/or use of iterative reconstruction technique. COMPARISON:  Noncontrast CT Abdomen and Pelvis 01/21/2021. FINDINGS: Lower chest: Calcified coronary artery atherosclerosis. Heart size remains normal. No pericardial effusion. Chronic tortuosity of the descending thoracic aorta. Lower lung volumes. Fairly symmetric lung base atelectasis. Chronic elevation of the right hemidiaphragm. No pleural effusion. Hepatobiliary: Mild gallbladder fundus calcification. Otherwise negative noncontrast liver and gallbladder. Pancreas: Negative. Spleen:  Negative. Adrenals/Urinary Tract: Normal adrenal glands. Negative noncontrast left kidney and left ureter. Right double-J ureteral stent was in place in 2022, now removed. There is moderate to severe right hydronephrosis and right hydroureter with pararenal and periureteral inflammatory stranding. The right ureter remains dilated to the pelvic inlet where obstructing 2 adjacent ureteral calculi are identified on coronal image 54. The larger is 4 mm. Distal to that the ureter is decompressed in the pelvis and to the bladder. Diminutive bladder. Punctate additional right lower pole nephrolithiasis. Stomach/Bowel: Fluid in nondilated sigmoid colon and the rectum. Mildly featureless appearance of the sigmoid. Decompressed upstream descending colon with diverticulosis but no active inflammation. Similar decompressed transverse colon with mild diverticulosis. Redundant hepatic flexure. Decompressed right colon. Retrocecal appendix appears to remain normal, contains gas and tracks to the inferior liver contour on coronal image 48. Decompressed terminal ileum. No dilated small bowel. Trace oral contrast or other dense enteric material in the stomach along with retained fluid. Decompressed duodenum. No pneumoperitoneum. No free fluid identified. Vascular/Lymphatic: Aortoiliac calcified atherosclerosis. Chronic tortuosity of the aorta. Negative for abdominal aortic aneurysm. Vascular patency is not evaluated in the absence of IV contrast. No lymphadenopathy identified. Reproductive: Chronically absent. Other: No pelvis free fluid. Musculoskeletal: Chronic degenerative appearing lower lumbar and lumbosacral junction facet ankylosis. Advanced facet arthropathy at the adjacent segment. Chronic lower thoracic flowing endplate osteophytes with interbody ankylosis. No acute osseous abnormality identified. IMPRESSION: 1. Moderate to Severe acute obstructive uropathy on the right with Hydronephrosis and Hydroureter due to two  adjacent ureteral calculiat the pelvic inlet, the larger 4 mm. Punctate additional right nephrolithiasis. 2. Fluid in the nondilated sigmoid colon and rectum raising the possibility of diarrhea. No bowel obstruction. Normal retrocecal appendix. 3.  Aortic Atherosclerosis (ICD10-I70.0). Electronically Signed  By: VEAR Hurst M.D.   On: 03/31/2024 08:00   DG Chest Port 1 View Result Date: 03/31/2024 CLINICAL DATA:  73 year old female with weakness, decreased p.o. EXAM: PORTABLE CHEST 1 VIEW COMPARISON:  Portable chest 12/27/2020. FINDINGS: Portable AP upright view at 0637 hours. Improved lung volumes. Mild elevation of the right hemidiaphragm is less pronounced, probably normal variant. Normal cardiac size and mediastinal contours. Visualized tracheal air column is within normal limits. Allowing for portable technique the lungs are clear. No pneumothorax or pleural effusion. Paucity of bowel gas in the upper abdomen. No acute osseous abnormality identified. IMPRESSION: No acute cardiopulmonary abnormality. Electronically Signed   By: VEAR Hurst M.D.   On: 03/31/2024 06:46    Cardiac Studies Echocardiogram done yesterday showed normal LV systolic function with mild mitral regurgitation  Patient Profile   73 y.o. female with history of paroxysmal atrial fibrillation, diabetes mellitus, essential hypertension and hyperlipidemia who presented with failure to thrive due to depression related to loss of her son.  She was found to have pyelonephritis and was noted to be in A-fib with RVR  Assessment & Plan  1.  Paroxysmal atrial fibrillation with RVR: History of paroxysmal atrial fibrillation and was most recently seen in our office in 2024 and she was in sinus rhythm at that time.  She reports not taking Eliquis  recently due to depression and lack of interest.  Recommend continuing heparin drip for now.  Continue rate control with oral metoprolol  and IV amiodarone.  If ventricular rate continues to be difficult to  control, I suspect that she will require a TEE guided cardioversion.  2.  Urinary tract infection with obstructive right renal stone status post ureteral stent placement.  Continue antibiotics.  3.  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  This is likely contributing to her medical issues.     For questions or updates, please contact [facility] Please consult [email or web address] for contact info under     Signed, Deatrice Cage, MD  04/01/2024, 3:25 PM

## 2024-04-01 NOTE — Plan of Care (Signed)
  Problem: Education: Goal: Ability to describe self-care measures that may prevent or decrease complications (Diabetes Survival Skills Education) will improve Outcome: Progressing   Problem: Clinical Measurements: Goal: Ability to maintain clinical measurements within normal limits will improve Outcome: Progressing Goal: Respiratory complications will improve Outcome: Progressing   Problem: Coping: Goal: Ability to adjust to condition or change in health will improve Outcome: Not Progressing   Problem: Activity: Goal: Risk for activity intolerance will decrease Outcome: Not Progressing

## 2024-04-01 NOTE — Hospital Course (Signed)
 Cynthia Lucas is a 73 y.o. female with medical history significant of  Atrial fibrillation , DMII, GERD, renal stones, Hypertension , OSA not on CPAP, essential tremor , who presents BIB EMS due to failure to thrive at home after son death with family nothing patient appears to be depressed and will not complete her ADLS and has decrease appetite. Upon arrival, patient had tachycardia, tachypnea.  Significant leukocytosis.  Lactic acid level was 4.9. CT scan showed a right-sided kidney stone with hydronephrosis. Patient was seen by urology, ureteral stent was placed.  Blood cultures so far has grew E. coli, she is currently covered with cefepime .

## 2024-04-01 NOTE — Progress Notes (Signed)
 PHARMACY - ANTICOAGULATION CONSULT NOTE  Pharmacy Consult for Heparin Indication: atrial fibrillation  Allergies  Allergen Reactions   Nickel Itching and Swelling   Other     Contact lense solution causes eye irritation    Patient Measurements: Height: 5' 7 (170.2 cm) Weight: 79.3 kg (174 lb 12.8 oz) IBW/kg (Calculated) : 61.6 HEPARIN DW (KG): 77.7  Vital Signs: Temp: 97.9 F (36.6 C) (06/30 0800) Temp Source: Oral (06/30 0106) BP: 119/82 (06/30 0900) Pulse Rate: 56 (06/30 0900)  Labs: Recent Labs    03/31/24 0629 03/31/24 0815 03/31/24 1147 04/01/24 0058 04/01/24 0446 04/01/24 1131  HGB 13.0  --   --   --  10.9*  --   HCT 39.7  --   --   --  33.5*  --   PLT 161  --   --   --  142*  --   HEPARINUNFRC  --   --   --  0.28*  --  0.29*  CREATININE 2.64*  --  1.98*  --  1.68*  --   TROPONINIHS 88* 50*  --   --   --   --     Estimated Creatinine Clearance: 32.3 mL/min (A) (by C-G formula based on SCr of 1.68 mg/dL (H)).   Medical History: Past Medical History:  Diagnosis Date   Atrial fibrillation (HCC)    Cough    CHRONIC   Diabetes mellitus without complication (HCC)    GERD (gastroesophageal reflux disease)    History of kidney stones    Hypertension    Sleep apnea    no cpap   Tremor    HEAD    Medications:  Scheduled:   atorvastatin   40 mg Oral QHS   Chlorhexidine  Gluconate Cloth  6 each Topical QHS   insulin  aspart  0-9 Units Subcutaneous TID WC   metoprolol  tartrate  25 mg Oral QID   Infusions:   amiodarone 30 mg/hr (04/01/24 0006)   ceFEPime  (MAXIPIME ) IV     heparin 1,250 Units/hr (04/01/24 0245)    Assessment: 27 yoF with hx of paroxysmal atrial fibrillation (not on anticoagulation), diabetes, hypertension, hyperlipidemia, OSA not tolerant to CPAP presenting to the hospital due to abdominal pain and poor PO intake. EKG showed A-fib RVR. Pharmacy consulted for heparin dosing.   Goal of Therapy:  Heparin level 0.3-0.7 units/ml Monitor  platelets by anticoagulation protocol: Yes   6/30@1131 : HL 0.29, SUBtherapeutic@1250  units/hr  Plan:  Heparin bolus 1200 units IV x 1 Increase heparin infusion to 1400 units/hr.  Check ~8hr heparin level.  Monitor CBC and for signs/symptoms of bleeding. Follow-up plans to transition to oral anticoagulation.   Tamarah Bhullar A Maggy Wyble, PharmD Clinical Pharmacist 04/01/2024 12:04 PM

## 2024-04-02 ENCOUNTER — Telehealth (HOSPITAL_COMMUNITY): Payer: Self-pay | Admitting: Pharmacy Technician

## 2024-04-02 ENCOUNTER — Other Ambulatory Visit (HOSPITAL_COMMUNITY): Payer: Self-pay

## 2024-04-02 DIAGNOSIS — A419 Sepsis, unspecified organism: Secondary | ICD-10-CM | POA: Diagnosis not present

## 2024-04-02 DIAGNOSIS — N201 Calculus of ureter: Secondary | ICD-10-CM | POA: Diagnosis not present

## 2024-04-02 DIAGNOSIS — N10 Acute tubulo-interstitial nephritis: Secondary | ICD-10-CM | POA: Diagnosis not present

## 2024-04-02 DIAGNOSIS — N390 Urinary tract infection, site not specified: Secondary | ICD-10-CM | POA: Diagnosis not present

## 2024-04-02 DIAGNOSIS — A4151 Sepsis due to Escherichia coli [E. coli]: Secondary | ICD-10-CM | POA: Diagnosis not present

## 2024-04-02 DIAGNOSIS — I4891 Unspecified atrial fibrillation: Secondary | ICD-10-CM | POA: Diagnosis not present

## 2024-04-02 DIAGNOSIS — R6520 Severe sepsis without septic shock: Secondary | ICD-10-CM

## 2024-04-02 LAB — BASIC METABOLIC PANEL WITH GFR
Anion gap: 7 (ref 5–15)
BUN: 44 mg/dL — ABNORMAL HIGH (ref 8–23)
CO2: 25 mmol/L (ref 22–32)
Calcium: 8.3 mg/dL — ABNORMAL LOW (ref 8.9–10.3)
Chloride: 105 mmol/L (ref 98–111)
Creatinine, Ser: 1.63 mg/dL — ABNORMAL HIGH (ref 0.44–1.00)
GFR, Estimated: 33 mL/min — ABNORMAL LOW (ref >60)
Glucose, Bld: 212 mg/dL — ABNORMAL HIGH (ref 70–99)
Potassium: 5 mmol/L (ref 3.5–5.1)
Sodium: 137 mmol/L (ref 135–145)

## 2024-04-02 LAB — MAGNESIUM: Magnesium: 2.9 mg/dL — ABNORMAL HIGH (ref 1.7–2.4)

## 2024-04-02 LAB — URINE CULTURE: Culture: >=100000 — AB

## 2024-04-02 LAB — GLUCOSE, CAPILLARY
Glucose-Capillary: 178 mg/dL — ABNORMAL HIGH (ref 70–99)
Glucose-Capillary: 196 mg/dL — ABNORMAL HIGH (ref 70–99)
Glucose-Capillary: 193 mg/dL — ABNORMAL HIGH (ref 70–99)
Glucose-Capillary: 179 mg/dL — ABNORMAL HIGH (ref 70–99)

## 2024-04-02 LAB — CBC
HCT: 34.5 % — ABNORMAL LOW (ref 36.0–46.0)
Hemoglobin: 11.3 g/dL — ABNORMAL LOW (ref 12.0–15.0)
MCH: 26.7 pg (ref 26.0–34.0)
MCHC: 32.8 g/dL (ref 30.0–36.0)
MCV: 81.4 fL (ref 80.0–100.0)
Platelets: 168 10*3/uL (ref 150–400)
RBC: 4.24 MIL/uL (ref 3.87–5.11)
RDW: 16.8 % — ABNORMAL HIGH (ref 11.5–15.5)
WBC: 13.4 10*3/uL — ABNORMAL HIGH (ref 4.0–10.5)
nRBC: 0 % (ref 0.0–0.2)

## 2024-04-02 LAB — HEPARIN LEVEL (UNFRACTIONATED): Heparin Unfractionated: 0.27 [IU]/mL — ABNORMAL LOW (ref 0.30–0.70)

## 2024-04-02 MED ORDER — HEPARIN BOLUS VIA INFUSION
1150.0000 [IU] | Freq: Once | INTRAVENOUS | Status: AC
  Administered 2024-04-02: 1150 [IU] via INTRAVENOUS
  Filled 2024-04-02: qty 1150

## 2024-04-02 MED ORDER — METOPROLOL TARTRATE 25 MG PO TABS
37.5000 mg | ORAL_TABLET | Freq: Four times a day (QID) | ORAL | Status: DC
  Administered 2024-04-02 – 2024-04-03 (×5): 37.5 mg via ORAL
  Filled 2024-04-02 (×5): qty 2

## 2024-04-02 MED ORDER — APIXABAN 5 MG PO TABS
5.0000 mg | ORAL_TABLET | Freq: Two times a day (BID) | ORAL | Status: DC
  Administered 2024-04-02 – 2024-04-06 (×9): 5 mg via ORAL
  Filled 2024-04-02 (×9): qty 1

## 2024-04-02 MED ORDER — INSULIN GLARGINE-YFGN 100 UNIT/ML ~~LOC~~ SOLN
10.0000 [IU] | Freq: Every day | SUBCUTANEOUS | Status: DC
  Administered 2024-04-02 – 2024-04-06 (×5): 10 [IU] via SUBCUTANEOUS
  Filled 2024-04-02 (×5): qty 0.1

## 2024-04-02 MED ORDER — SODIUM CHLORIDE 0.9 % IV SOLN
2.0000 g | INTRAVENOUS | Status: AC
  Administered 2024-04-02 – 2024-04-03 (×2): 2 g via INTRAVENOUS
  Filled 2024-04-02 (×2): qty 20

## 2024-04-02 NOTE — Progress Notes (Signed)
 PHARMACY - ANTICOAGULATION CONSULT NOTE  Pharmacy Consult for Heparin Indication: atrial fibrillation  Allergies  Allergen Reactions   Nickel Itching and Swelling   Other     Contact lense solution causes eye irritation    Patient Measurements: Height: 5' 7 (170.2 cm) Weight: 79.3 kg (174 lb 12.8 oz) IBW/kg (Calculated) : 61.6 HEPARIN DW (KG): 77.7  Vital Signs: Temp: 97.7 F (36.5 C) (07/01 0442) BP: 109/75 (07/01 0442) Pulse Rate: 107 (07/01 0442)  Labs: Recent Labs    03/31/24 0629 03/31/24 0815 03/31/24 1147 04/01/24 0058 04/01/24 0446 04/01/24 1131 04/01/24 2037 04/02/24 0410  HGB 13.0  --   --   --  10.9*  --   --  11.3*  HCT 39.7  --   --   --  33.5*  --   --  34.5*  PLT 161  --   --   --  142*  --   --  168  HEPARINUNFRC  --   --   --    < >  --  0.29* 0.33 0.27*  CREATININE 2.64*  --  1.98*  --  1.68*  --   --  1.63*  TROPONINIHS 88* 50*  --   --   --   --   --   --    < > = values in this interval not displayed.    Estimated Creatinine Clearance: 33.3 mL/min (A) (by C-G formula based on SCr of 1.63 mg/dL (H)).   Medical History: Past Medical History:  Diagnosis Date   Atrial fibrillation (HCC)    Cough    CHRONIC   Diabetes mellitus without complication (HCC)    GERD (gastroesophageal reflux disease)    History of kidney stones    Hypertension    Sleep apnea    no cpap   Tremor    HEAD    Medications:  Scheduled:   atorvastatin   40 mg Oral QHS   Chlorhexidine  Gluconate Cloth  6 each Topical QHS   heparin  1,150 Units Intravenous Once   insulin  aspart  0-9 Units Subcutaneous TID WC   metoprolol  tartrate  25 mg Oral QID   Infusions:   amiodarone 30 mg/hr (04/02/24 0028)   ceFEPime  (MAXIPIME ) IV 2 g (04/01/24 1746)   heparin 1,450 Units/hr (04/02/24 0353)    Assessment: 7 yoF with hx of paroxysmal atrial fibrillation (not on anticoagulation), diabetes, hypertension, hyperlipidemia, OSA not tolerant to CPAP presenting to the  hospital due to abdominal pain and poor PO intake. EKG showed A-fib RVR. Pharmacy consulted for heparin dosing.   Goal of Therapy:  Heparin level 0.3-0.7 units/ml Monitor platelets by anticoagulation protocol: Yes   6/30@1131 : HL 0.29, SUBtherapeutic@1250  units/hr 6/30@2037 : HL 0.33, therapeutic x1 7/01@0410 : HL 0.27, SUBtherapeutic   Plan:  7/01:  HL @ 0410 = 0.27, SUBtherapeutic  - will order heparin 1150 units IV X 1 bolus and increase drip rate 1600 units/hr - will recheck HL 8 hrs after rate change  Monitor CBC and for signs/symptoms of bleeding. Follow-up plans to transition to oral anticoagulation.   Thank you for involving pharmacy in this patient's care.   Adaline Trejos D Clinical Pharmacist 04/02/2024 5:10 AM

## 2024-04-02 NOTE — Progress Notes (Signed)
 Rounding Note   Patient Name: Cynthia Lucas Date of Encounter: 04/02/2024  [facility] Cardiologist: Deatrice Cage, MD   Subjective Patient denies chest pain, palpitations, and shortness of breath. She remains in atrial fibrillation with rates better controlled today.   Scheduled Meds:  apixaban   5 mg Oral BID   atorvastatin   40 mg Oral QHS   Chlorhexidine  Gluconate Cloth  6 each Topical QHS   insulin  aspart  0-9 Units Subcutaneous TID WC   insulin  glargine-yfgn  10 Units Subcutaneous Daily   metoprolol  tartrate  37.5 mg Oral Q6H   Continuous Infusions:  ceFEPime  (MAXIPIME ) IV 2 g (04/02/24 0631)   PRN Meds: albuterol, fentaNYL  (SUBLIMAZE ) injection, ondansetron  **OR** ondansetron  (ZOFRAN ) IV   Vital Signs  Vitals:   04/01/24 2100 04/02/24 0040 04/02/24 0442 04/02/24 0802  BP: 120/74 109/75 109/75 116/83  Pulse: (!) 102 65 (!) 107 99  Resp: 18 20 20    Temp:  97.8 F (36.6 C) 97.7 F (36.5 C) 97.9 F (36.6 C)  TempSrc:      SpO2:  96% 98% 97%  Weight:      Height:        Intake/Output Summary (Last 24 hours) at 04/02/2024 1009 Last data filed at 04/02/2024 0631 Gross per 24 hour  Intake 922.66 ml  Output 1000 ml  Net -77.34 ml      03/31/2024    6:15 AM 11/01/2022   10:18 AM 04/26/2022    3:13 PM  Last 3 Weights  Weight (lbs) 174 lb 12.8 oz 213 lb 4 oz 202 lb  Weight (kg) 79.289 kg 96.73 kg 91.627 kg      Telemetry Atrial fibrillation with rates 80-100 bpm - Personally Reviewed  Physical Exam  GEN: No acute distress.   Neck: No JVD Cardiac: IRIR, no murmurs, rubs, or gallops.  Respiratory: Clear to auscultation bilaterally. GI: Soft, nontender, non-distended  MS: No edema; No deformity. Neuro:  Nonfocal  Psych: Normal affect   Labs High Sensitivity Troponin:   Recent Labs  Lab 03/31/24 0629 03/31/24 0815  TROPONINIHS 88* 50*     Chemistry Recent Labs  Lab 03/31/24 0629 03/31/24 1147 04/01/24 0446 04/02/24 0410  NA 135 138 135  137  K 2.9* 3.2* 4.4 5.0  CL 100 106 106 105  CO2 22 21* 21* 25  GLUCOSE 359* 209* 261* 212*  BUN 48* 38* 37* 44*  CREATININE 2.64* 1.98* 1.68* 1.63*  CALCIUM  8.3* 7.8* 8.0* 8.3*  MG 2.0 1.9  --  2.9*  PROT 6.7  --  5.8*  --   ALBUMIN 3.1*  --  2.5*  --   AST 60*  --  22  --   ALT 31  --  24  --   ALKPHOS 72  --  72  --   BILITOT 1.0  --  0.5  --   GFRNONAA 19* 26* 32* 33*  ANIONGAP 13 11 8 7     Lipids No results for input(s): CHOL, TRIG, HDL, LABVLDL, LDLCALC, CHOLHDL in the last 168 hours.  Hematology Recent Labs  Lab 03/31/24 0629 04/01/24 0446 04/02/24 0410  WBC 17.4* 14.7* 13.4*  RBC 4.95 4.18 4.24  HGB 13.0 10.9* 11.3*  HCT 39.7 33.5* 34.5*  MCV 80.2 80.1 81.4  MCH 26.3 26.1 26.7  MCHC 32.7 32.5 32.8  RDW 15.9* 16.2* 16.8*  PLT 161 142* 168   Thyroid No results for input(s): TSH, FREET4 in the last 168 hours.  BNPNo results for input(s): BNP,  PROBNP in the last 168 hours.  DDimer No results for input(s): DDIMER in the last 168 hours.   Cardiac Studies  03/31/2024 Echo complete 1. Left ventricular ejection fraction, by estimation, is 55 to 60%. The left ventricle has normal function. The left ventricle has no regional wall motion abnormalities. There is mild left ventricular hypertrophy. Left ventricular diastolic parameters  are indeterminate.   2. Right ventricular systolic function is normal. The right ventricular size is normal. There is normal pulmonary artery systolic pressure.   3. The mitral valve is normal in structure. Mild mitral valve  regurgitation.   4. The aortic valve is tricuspid. Aortic valve regurgitation is mild.   5. Aortic dilatation noted. There is mild dilatation of the ascending aorta, measuring 44 mm.   6. The inferior vena cava is normal in size with greater than 50% respiratory variability, suggesting right atrial pressure of 3 mmHg.   Patient Profile   73 y.o. female with history of paroxysmal atrial  fibrillation, diabetes mellitus, essential hypertension and hyperlipidemia who presented with failure to thrive due to depression related to loss of her son.  She was found to have pyelonephritis and was noted to be in A-fib with RVR  Assessment & Plan   Paroxysmal atrial fibrillation with RVR - Longstanding history of paroxysmal atrial fibrillation not compliant with Eliquis  prior to admission - Remains in atrial fibrillation with rates better controlled today 80-100 bpm - Discontinue IV amiodarone - Increase metoprolol  tartrate to 37.5 mg every 6 hours  - If rates remain difficult to control, may need to consider TEE/DCCV - Transitioned from IV heparin to PTA Eliquis  5 mg twice daily yesterday  UTI with obstructive right renal stone Sepsis secondary to pyelonephritis - S/p ureteral stent placement - Ongoing management per IM  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

## 2024-04-02 NOTE — Telephone Encounter (Signed)

## 2024-04-02 NOTE — Plan of Care (Signed)
  Problem: Coping: Goal: Ability to adjust to condition or change in health will improve Outcome: Progressing   Problem: Fluid Volume: Goal: Ability to maintain a balanced intake and output will improve Outcome: Progressing   Problem: Metabolic: Goal: Ability to maintain appropriate glucose levels will improve Outcome: Progressing   Problem: Nutritional: Goal: Maintenance of adequate nutrition will improve Outcome: Progressing   Problem: Skin Integrity: Goal: Risk for impaired skin integrity will decrease Outcome: Progressing   Problem: Education: Goal: Knowledge of General Education information will improve Description: Including pain rating scale, medication(s)/side effects and non-pharmacologic comfort measures Outcome: Progressing   Problem: Health Behavior/Discharge Planning: Goal: Ability to manage health-related needs will improve Outcome: Progressing   Problem: Activity: Goal: Risk for activity intolerance will decrease Outcome: Progressing   Problem: Nutrition: Goal: Adequate nutrition will be maintained Outcome: Progressing   Problem: Pain Managment: Goal: General experience of comfort will improve and/or be controlled Outcome: Progressing   Problem: Safety: Goal: Ability to remain free from injury will improve Outcome: Progressing   Problem: Skin Integrity: Goal: Risk for impaired skin integrity will decrease Outcome: Progressing

## 2024-04-02 NOTE — Care Management Important Message (Signed)
 Important Message  Patient Details  Name: Cynthia Lucas MRN: [id number] Date of Birth: April 02, 1951   Important Message Given:  Yes - Medicare IM     Rojelio [name] 04/02/2024, 12:38 PM

## 2024-04-02 NOTE — Progress Notes (Signed)
 PT Cancellation Note  Patient Details Name: Cynthia Lucas MRN: [id number] DOB: May 25, 1951   Cancelled Treatment:     Pt received in bed with c/o 8/10 L lower abd and chest pain which she states comes and goes. BP 120/84, SpO2 95% on RA, HR on Dynamap 83-130, HR on cardiac monitor 85-117. Nsg/MD notified. Pt states she is unable to tolerate PT session. Also states she was scheduled to have L eye removed tomorrow due to chronic pain, which will now be rescheduled. Continue PT next available date/time per POC.    Darice JAYSON Bohr 04/02/2024, 1:04 PM

## 2024-04-02 NOTE — Progress Notes (Signed)
 Urology Inpatient Progress Note  Subjective: No acute events overnight. She is afebrile, VSS. WBC count down, 13.4.  Creatinine, 1.63.  Urine culture and blood cultures growing pansensitive E. coli, on antibiotics as below. Foley catheter in place draining clear, yellow urine. She reports facial pain today but denies abdominal pain.  Anti-infectives: Anti-infectives (From admission, onward)    Start     Dose/Rate Route Frequency Ordered Stop   04/01/24 1700  ceFEPIme  (MAXIPIME ) 2 g in sodium chloride  0.9 % 100 mL IVPB        2 g 200 mL/hr over 30 Minutes Intravenous Every 12 hours 04/01/24 0940     04/01/24 0800  ceFEPIme  (MAXIPIME ) 2 g in sodium chloride  0.9 % 100 mL IVPB  Status:  Discontinued        2 g 200 mL/hr over 30 Minutes Intravenous Every 24 hours 03/31/24 1241 04/01/24 0940   03/31/24 0715  ceFEPIme  (MAXIPIME ) 2 g in sodium chloride  0.9 % 100 mL IVPB        2 g 200 mL/hr over 30 Minutes Intravenous  Once 03/31/24 0714 03/31/24 0809   03/31/24 0715  metroNIDAZOLE (FLAGYL) IVPB 500 mg        500 mg 100 mL/hr over 60 Minutes Intravenous  Once 03/31/24 0714 03/31/24 0841   03/31/24 0715  vancomycin (VANCOCIN) IVPB 1000 mg/200 mL premix        1,000 mg 200 mL/hr over 60 Minutes Intravenous  Once 03/31/24 0714 03/31/24 9092       Current Facility-Administered Medications  Medication Dose Route Frequency Provider Last Rate Last Admin   albuterol (PROVENTIL) (2.5 MG/3ML) 0.083% nebulizer solution 2.5 mg  2.5 mg Nebulization Q2H PRN Debby Camila LABOR, MD       apixaban  (ELIQUIS ) tablet 5 mg  5 mg Oral BID End, Lonni, MD   5 mg at 04/02/24 0935   atorvastatin  (LIPITOR) tablet 40 mg  40 mg Oral QHS Thomas, Sara-Maiz A, MD   40 mg at 04/01/24 2145   ceFEPIme  (MAXIPIME ) 2 g in sodium chloride  0.9 % 100 mL IVPB  2 g Intravenous Q12H Nazari, Walid A, RPH 200 mL/hr at 04/02/24 0631 2 g at 04/02/24 0631   Chlorhexidine  Gluconate Cloth 2 % PADS 6 each  6 each Topical QHS  Debby Camila LABOR, MD   6 each at 04/01/24 2146   fentaNYL  (SUBLIMAZE ) injection 12.5 mcg  12.5 mcg Intravenous Q2H PRN Debby Camila A, MD   12.5 mcg at 04/02/24 0044   insulin  aspart (novoLOG ) injection 0-9 Units  0-9 Units Subcutaneous TID WC Debby Camila LABOR, MD   3 Units at 04/02/24 0913   insulin  glargine-yfgn (SEMGLEE ) injection 10 Units  10 Units Subcutaneous Daily Zhang, Dekui, MD   10 Units at 04/02/24 0913   metoprolol  tartrate (LOPRESSOR ) tablet 37.5 mg  37.5 mg Oral Q6H End, Christopher, MD   37.5 mg at 04/02/24 9083   ondansetron  (ZOFRAN ) tablet 4 mg  4 mg Oral Q6H PRN Debby Camila LABOR, MD       Or   ondansetron  (ZOFRAN ) injection 4 mg  4 mg Intravenous Q6H PRN Debby Camila LABOR, MD         Objective: Vital signs in last 24 hours: Temp:  [97.7 F (36.5 C)-98.6 F (37 C)] 97.9 F (36.6 C) (07/01 0802) Pulse Rate:  [65-107] 99 (07/01 0802) Resp:  [14-25] 20 (07/01 0442) BP: (106-120)/(63-83) 116/83 (07/01 0802) SpO2:  [92 %-98 %] 97 % (07/01 0802)  Intake/Output from  previous day: 06/30 0701 - 07/01 0700 In: 922.7 [I.V.:822.7; IV Piggyback:100] Out: 1000 [Urine:1000] Intake/Output this shift: No intake/output data recorded.  Physical Exam Vitals and nursing note reviewed.  Constitutional:      General: She is not in acute distress.    Appearance: She is not ill-appearing, toxic-appearing or diaphoretic.  HENT:     Head: Normocephalic and atraumatic.  Pulmonary:     Effort: Pulmonary effort is normal. No respiratory distress.   Skin:    General: Skin is warm and dry.   Neurological:     Mental Status: She is alert and oriented to person, place, and time.   Psychiatric:        Mood and Affect: Mood normal.        Behavior: Behavior normal.    Lab Results:  Recent Labs    04/01/24 0446 04/02/24 0410  WBC 14.7* 13.4*  HGB 10.9* 11.3*  HCT 33.5* 34.5*  PLT 142* 168   BMET Recent Labs    04/01/24 0446 04/02/24 0410  NA 135 137  K  4.4 5.0  CL 106 105  CO2 21* 25  GLUCOSE 261* 212*  BUN 37* 44*  CREATININE 1.68* 1.63*  CALCIUM  8.0* 8.3*   Studies/Results: ECHOCARDIOGRAM COMPLETE Result Date: 03/31/2024    ECHOCARDIOGRAM REPORT   Patient Name:   Cynthia Lucas Date of Exam: 03/31/2024 Medical Rec #:  [id number]     Height:       67.0 in Accession #:    [id number]    Weight:       174.8 lb Date of Birth:  Mar 28, 1951     BSA:          1.910 m Patient Age:    73 years      BP:           128/53 mmHg Patient Gender: F             HR:           113 bpm. Exam Location:  [facility] Procedure: 2D Echo, Color Doppler and Cardiac Doppler (Both Spectral and Color            Flow Doppler were utilized during procedure). Indications:     Elevated Troponin  History:         Patient has prior history of Echocardiogram examinations.                  Arrythmias:Atrial Fibrillation; Risk Factors:Diabetes and                  Hypertension.  Sonographer:     L. Thornton-Maynard Referring Phys:  [id number] CAMILA A THOMAS Diagnosing Phys: Redell Cave MD  Sonographer Comments: Suboptimal apical window. IMPRESSIONS  1. Left ventricular ejection fraction, by estimation, is 55 to 60%. The left ventricle has normal function. The left ventricle has no regional wall motion abnormalities. There is mild left ventricular hypertrophy. Left ventricular diastolic parameters are indeterminate.  2. Right ventricular systolic function is normal. The right ventricular size is normal. There is normal pulmonary artery systolic pressure.  3. The mitral valve is normal in structure. Mild mitral valve regurgitation.  4. The aortic valve is tricuspid. Aortic valve regurgitation is mild.  5. Aortic dilatation noted. There is mild dilatation of the ascending aorta, measuring 44 mm.  6. The inferior vena cava is normal in size with greater than 50% respiratory variability, suggesting right atrial pressure of 3 mmHg. FINDINGS  Left Ventricle: Left ventricular  ejection fraction, by  estimation, is 55 to 60%. The left ventricle has normal function. The left ventricle has no regional wall motion abnormalities. The left ventricular internal cavity size was normal in size. There is  mild left ventricular hypertrophy. Left ventricular diastolic parameters are indeterminate. Right Ventricle: The right ventricular size is normal. No increase in right ventricular wall thickness. Right ventricular systolic function is normal. There is normal pulmonary artery systolic pressure. The tricuspid regurgitant velocity is 2.68 m/s, and  with an assumed right atrial pressure of 3 mmHg, the estimated right ventricular systolic pressure is 31.7 mmHg. Left Atrium: Left atrial size was normal in size. Right Atrium: Right atrial size was normal in size. Pericardium: There is no evidence of pericardial effusion. Mitral Valve: The mitral valve is normal in structure. Mild mitral valve regurgitation. MV peak gradient, 3.9 mmHg. The mean mitral valve gradient is 2.0 mmHg. Tricuspid Valve: The tricuspid valve is normal in structure. Tricuspid valve regurgitation is mild. Aortic Valve: The aortic valve is tricuspid. Aortic valve regurgitation is mild. Aortic regurgitation PHT measures 309 msec. Aortic valve mean gradient measures 5.3 mmHg. Aortic valve peak gradient measures 10.1 mmHg. Aortic valve area, by VTI measures 2.07 cm. Pulmonic Valve: The pulmonic valve was normal in structure. Pulmonic valve regurgitation is mild. Aorta: Aortic dilatation noted. There is mild dilatation of the ascending aorta, measuring 44 mm. Venous: The inferior vena cava is normal in size with greater than 50% respiratory variability, suggesting right atrial pressure of 3 mmHg. IAS/Shunts: No atrial level shunt detected by color flow Doppler.  LEFT VENTRICLE PLAX 2D LVIDd:         3.80 cm     Diastology LVIDs:         3.30 cm     LV e' medial:    10.50 cm/s LV PW:         1.30 cm     LV E/e' medial:  8.0 LV IVS:        1.40 cm     LV e'  lateral:   10.50 cm/s LVOT diam:     1.90 cm     LV E/e' lateral: 8.0 LV SV:         43 LV SV Index:   23 LVOT Area:     2.84 cm  LV Volumes (MOD) LV vol d, MOD A2C: 60.2 ml LV vol d, MOD A4C: 59.6 ml LV vol s, MOD A2C: 25.6 ml LV vol s, MOD A4C: 15.6 ml LV SV MOD A2C:     34.6 ml LV SV MOD A4C:     59.6 ml LV SV MOD BP:      41.0 ml RIGHT VENTRICLE             IVC RV Basal diam:  3.00 cm     IVC diam: 1.00 cm RV S prime:     11.85 cm/s TAPSE (M-mode): 1.4 cm LEFT ATRIUM             Index        RIGHT ATRIUM           Index LA diam:        3.70 cm 1.94 cm/m   RA Area:     14.50 cm LA Vol (A2C):   32.8 ml 17.17 ml/m  RA Volume:   34.00 ml  17.80 ml/m LA Vol (A4C):   52.3 ml 27.39 ml/m LA Biplane Vol: 42.5 ml 22.25 ml/m  AORTIC VALVE  PULMONIC VALVE AV Area (Vmax):    2.04 cm      PV Vmax:          1.35 m/s AV Area (Vmean):   2.07 cm      PV Peak grad:     7.3 mmHg AV Area (VTI):     2.07 cm      PR End Diast Vel: 8.47 msec AV Vmax:           159.00 cm/s AV Vmean:          107.167 cm/s AV VTI:            0.210 m AV Peak Grad:      10.1 mmHg AV Mean Grad:      5.3 mmHg LVOT Vmax:         114.67 cm/s LVOT Vmean:        78.100 cm/s LVOT VTI:          0.153 m LVOT/AV VTI ratio: 0.73 AI PHT:            309 msec  AORTA Ao Root diam: 3.10 cm Ao Asc diam:  4.40 cm MITRAL VALVE               TRICUSPID VALVE MV Area (PHT): 8.29 cm    TR Peak grad:   28.7 mmHg MV Area VTI:   4.59 cm    TR Vmax:        268.00 cm/s MV Peak grad:  3.9 mmHg MV Mean grad:  2.0 mmHg    SHUNTS MV Vmax:       0.99 m/s    Systemic VTI:  0.15 m MV Vmean:      63.0 cm/s   Systemic Diam: 1.90 cm MV Decel Time: 92 msec MV E velocity: 83.85 cm/s Redell Cave MD Electronically signed by Redell Cave MD Signature Date/Time: 03/31/2024/1:08:57 PM    Final    Assessment & Plan: 73 y.o. female with PMH CKD, diabetes, A-fib, and essential tremor now s/p right ureteral stent placement with Dr. Alvaro for management of an  obstructing right ureteral stone with sepsis due to UTI.  She is clinically improving on culture appropriate antibiotics and tolerating her stent well.  We discussed common stent symptoms including dysuria, abdominal/flank pain, and gross hematuria.  We discussed that Dr. Alvaro will contact her next week to arrange outpatient ureteroscopy.  She expressed understanding.  Okay to DC Foley catheter, as she has been afebrile for greater than 24 hours.  Cynthia Rainey, PA-C 04/02/2024

## 2024-04-02 NOTE — Progress Notes (Signed)
 Progress Note   [name] FMW:[id number] DOB: Nov 03, 1950 DOA: 03/31/2024     2 DOS: the patient was seen and examined on 04/02/2024   Brief hospital course: Cynthia Lucas is a 73 y.o. female with medical history significant of  Atrial fibrillation , DMII, GERD, renal stones, Hypertension , OSA not on CPAP, essential tremor , who presents BIB EMS due to failure to thrive at home after son death with family nothing patient appears to be depressed and will not complete her ADLS and has decrease appetite. Upon arrival, patient had tachycardia, tachypnea.  Significant leukocytosis.  Lactic acid level was 4.9. CT scan showed a right-sided kidney stone with hydronephrosis. Patient was seen by urology, ureteral stent was placed.  Blood cultures so far has grew E. coli, she is currently covered with cefepime .   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 coli septicemia (HCC)   Overweight (BMI 25.0-29.9)   Assessment and Plan: Severe sepsis secondary to pyelonephritis E. coli septicemia secondary to pyelonephritis. Acute right sided pyelonephritis secondary to hydronephrosis. Right sided kidney stone with obstruction and hydronephrosis. Patient met severe sepsis criteria with tachycardia, tachypnea, leukocytosis, severe lactic acidosis.  This is a secondary to pyelonephritis. Blood cultures so far growing E. coli.  Urine culture also positive for E. coli.  Final susceptibilities still pending.  Will continue cefepime  for now. Foley catheter will be removed by urology today.   Paroxysmal atrial fibrillation with RVR. Elevated troponin  secondary to sepsis Patient developed RVR due to severe sepsis. Seen by cardiology, patient is treated with amiodarone and heparin drip. Changed to Eliquis  yesterday.   Chronic kidney disease stage IIIb. Mild metabolic acidosis. Hypokalemia resolved. Acute kidney injury ruled out. Reviewed prior labs, patient had chronic kidney disease, no acute exacerbation. Metabolic acidosis resolved today.   Uncontrolled type 2 diabetes with hyperglycemia Glucose running high, added insulin  glargine.   Overweight. Diet and excise.   Tremor, possibility of Parkinson disease. Patient has essential tremor, this can be followed with PCP and refer to neurology as outpatient. Patient also has significant weakness, PT/OT recommend home health.       Subjective:  Patient feels better, still has a poor appetite.  No shortness of breath.  Physical Exam: Vitals:   04/01/24 2100 04/02/24 0040 04/02/24 0442 04/02/24 0802  BP: 120/74 109/75 109/75 116/83  Pulse: (!) 102 65 (!) 107 99  Resp: 18 20 20    Temp:  97.8 F (36.6 C) 97.7 F (36.5 C) 97.9 F (36.6 C)  TempSrc:      SpO2:  96% 98% 97%  Weight:      Height:       General exam: Appears calm and comfortable  Respiratory system: Clear to auscultation. Respiratory effort normal. Cardiovascular system: Irregular. No JVD, murmurs, rubs, gallops or clicks. No pedal edema. Gastrointestinal system: Abdomen is nondistended, soft and nontender. No organomegaly or masses felt. Normal bowel sounds heard. Central nervous system: Alert and oriented. No focal neurological deficits. Extremities: Symmetric 5 x 5 power. Skin: No rashes, lesions or ulcers Psychiatry: Judgement and insight appear normal. Mood & affect appropriate.    Data  Reviewed:  Lab results reviewed.  Family Communication: None  Disposition: Status is: Inpatient Remains inpatient appropriate because: Severity of disease, IV treatment.     Time spent: 35  minutes  Author: Murvin Mana, MD 04/02/2024 11:06 AM  For on call review [email or web address].

## 2024-04-02 NOTE — Plan of Care (Signed)
   Problem: Education: Goal: Ability to describe self-care measures that may prevent or decrease complications (Diabetes Survival Skills Education) will improve Outcome: Progressing

## 2024-04-02 NOTE — Progress Notes (Signed)
 Occupational Therapy Treatment Patient Details Name: Cynthia Lucas MRN: [id number] DOB: [birth year] Today's Date: 04/02/2024   History of present illness Pt admitted for sepsis secondary to UTI with complaints of depression after losing her son, now with decreased mobility/weakness. Pt s/p cystoscopy with stent placed on 03/31/24. PMH includes Afib, DM, HTN, and tremors. Currently on amio drip, cleared to work with her via [employer] fundraiser.   OT comments  Pt seen for OT tx. Pt agreeable to session, noting desire to attempt to have BM. Pt required increased effort/time to complete bed mobility 2/2 tremors but no direct assist required. Fair static sitting balance, requiring MIN A to correct for posterior LOB upon initial STS EOB with VC for weight shift and hand placement on RW. CGA with RW to ambulate short in-room distance to BSC (~10') and noted with improved STS transfer from [facility] x2 with additional VC for hand placement. Required UE support on RW while completing pericare in standing. Pt educated in AE for self feeding to minimize impact of tremors. Pt verbalized understanding and expressed interest in learning more. Pt progressing and continues to benefit from further OT services upon discharge.       If plan is discharge home, recommend the following:  Help with stairs or ramp for entrance   Equipment Recommendations  BSC/3in1       Precautions / Restrictions Precautions Precautions: Fall Recall of Precautions/Restrictions: Intact Restrictions Weight Bearing Restrictions Per Provider Order: No     Mobility Bed Mobility Overal bed mobility: Needs Assistance Bed Mobility: Supine to Sit, Sit to Supine     Supine to sit: Supervision Sit to supine: Supervision   General bed mobility comments: incr time 2/2 tremors    Transfers Overall transfer level: Needs assistance Equipment used: Rolling walker (2 wheels) Transfers: Sit to/from Stand Sit to Stand: Contact guard assist, Min assist       General transfer comment: initial posterior slight LOB requiring MIN A To prevent sitting back down on the bed     Balance Overall balance assessment: Needs assistance Sitting-balance support: No upper extremity supported, Feet supported Sitting balance-Leahy Scale: Fair   Postural control: Posterior lean Standing balance support: Bilateral upper extremity supported, Reliant on assistive device for balance, During functional activity, Single extremity supported Standing balance-Leahy Scale: Poor Standing balance comment: initial LOB posteriorly upon STS from EOB requiring MIN A To correct, but then CGA from [facility] x2 with VC for hand placement to improve stability       ADL either performed or assessed with clinical judgement   ADL Overall ADL's : Needs assistance/impaired        Toilet Transfer: Ambulation;BSC/3in1;Rolling walker (2 wheels);Contact guard assist;Minimal assistance Toilet Transfer Details (indicate cue type and reason): slightly unsteady, VC for hand placement to prevent LOB Toileting- Clothing Manipulation and Hygiene: Sit to/from stand;Contact guard assist;Set up       Functional mobility during ADLs: Contact guard assist;Minimal assistance;Rolling walker (2 wheels)       Communication Communication Communication: No apparent difficulties   Cognition Arousal: Alert Behavior During Therapy: WFL for tasks assessed/performed Cognition: No apparent impairments       Following commands: Intact        Cueing   Cueing Techniques: Verbal cues  Exercises Other Exercises Other Exercises: Pt educated in AE for self feeding to minimize impact of tremors    Pertinent Vitals/ Pain       Pain Assessment Pain Assessment: No/denies pain   Frequency  Min 2X/week  Progress Toward Goals  OT Goals(current goals can now be found in the care plan section)  Progress towards OT goals: Progressing toward goals  Acute Rehab OT Goals Patient Stated Goal: go  home OT Goal Formulation: With patient Time For Goal Achievement: 04/15/24 Potential to Achieve Goals: Good      AM-PAC OT 6 Clicks Daily Activity     Outcome Measure   Help from another person eating meals?: A Little Help from another person taking care of personal grooming?: None Help from another person toileting, which includes using toliet, bedpan, or urinal?: A Little Help from another person bathing (including washing, rinsing, drying)?: A Little Help from another person to put on and taking off regular upper body clothing?: None Help from another person to put on and taking off regular lower body clothing?: A Little 6 Click Score: 20    End of Session Equipment Utilized During Treatment: Rolling walker (2 wheels)  OT Visit Diagnosis: Other abnormalities of gait and mobility (R26.89);Muscle weakness (generalized) (M62.81)   Activity Tolerance Patient tolerated treatment well   Patient Left in bed;with call bell/phone within reach;with bed alarm set   Nurse Communication Mobility status;Other (comment) (urinated a small amount in the [facility] but has foley; wants L wrist IV removed)        Time: 8945-8880 OT Time Calculation (min): 25 min  Charges: OT General Charges $OT Visit: 1 Visit OT Treatments $Self Care/Home Management : 23-37 mins  Warren SAUNDERS., MPH, MS, OTR/L ascom [phone] 04/02/24, 11:24 AM

## 2024-04-03 DIAGNOSIS — N1832 Chronic kidney disease, stage 3b: Secondary | ICD-10-CM | POA: Diagnosis not present

## 2024-04-03 DIAGNOSIS — N390 Urinary tract infection, site not specified: Secondary | ICD-10-CM | POA: Diagnosis not present

## 2024-04-03 DIAGNOSIS — I4891 Unspecified atrial fibrillation: Secondary | ICD-10-CM | POA: Diagnosis not present

## 2024-04-03 DIAGNOSIS — N10 Acute tubulo-interstitial nephritis: Secondary | ICD-10-CM | POA: Diagnosis not present

## 2024-04-03 DIAGNOSIS — A419 Sepsis, unspecified organism: Secondary | ICD-10-CM | POA: Diagnosis not present

## 2024-04-03 LAB — BASIC METABOLIC PANEL WITH GFR
Anion gap: 7 (ref 5–15)
BUN: 35 mg/dL — ABNORMAL HIGH (ref 8–23)
CO2: 24 mmol/L (ref 22–32)
Calcium: 8.1 mg/dL — ABNORMAL LOW (ref 8.9–10.3)
Chloride: 107 mmol/L (ref 98–111)
Creatinine, Ser: 1.47 mg/dL — ABNORMAL HIGH (ref 0.44–1.00)
GFR, Estimated: 37 mL/min — ABNORMAL LOW (ref >60)
Glucose, Bld: 140 mg/dL — ABNORMAL HIGH (ref 70–99)
Potassium: 3.7 mmol/L (ref 3.5–5.1)
Sodium: 138 mmol/L (ref 135–145)

## 2024-04-03 LAB — CULTURE, BLOOD (ROUTINE X 2)

## 2024-04-03 LAB — GLUCOSE, CAPILLARY
Glucose-Capillary: 132 mg/dL — ABNORMAL HIGH (ref 70–99)
Glucose-Capillary: 217 mg/dL — ABNORMAL HIGH (ref 70–99)
Glucose-Capillary: 184 mg/dL — ABNORMAL HIGH (ref 70–99)
Glucose-Capillary: 196 mg/dL — ABNORMAL HIGH (ref 70–99)

## 2024-04-03 MED ORDER — METOPROLOL TARTRATE 25 MG PO TABS
12.5000 mg | ORAL_TABLET | Freq: Once | ORAL | Status: AC
  Administered 2024-04-03: 12.5 mg via ORAL
  Filled 2024-04-03: qty 1

## 2024-04-03 MED ORDER — AMOXICILLIN 500 MG PO CAPS
1000.0000 mg | ORAL_CAPSULE | Freq: Three times a day (TID) | ORAL | Status: DC
  Administered 2024-04-04 – 2024-04-06 (×7): 1000 mg via ORAL
  Filled 2024-04-03 (×8): qty 2

## 2024-04-03 MED ORDER — BUTALBITAL-APAP-CAFFEINE 50-325-40 MG PO TABS
1.0000 | ORAL_TABLET | Freq: Four times a day (QID) | ORAL | Status: DC | PRN
  Administered 2024-04-03 – 2024-04-06 (×5): 1 via ORAL
  Filled 2024-04-03 (×5): qty 1

## 2024-04-03 MED ORDER — POLYETHYLENE GLYCOL 3350 17 G PO PACK
17.0000 g | PACK | Freq: Once | ORAL | Status: AC
  Administered 2024-04-03: 17 g via ORAL
  Filled 2024-04-03: qty 1

## 2024-04-03 MED ORDER — METOPROLOL TARTRATE 50 MG PO TABS
50.0000 mg | ORAL_TABLET | Freq: Two times a day (BID) | ORAL | Status: DC
  Administered 2024-04-03 – 2024-04-06 (×6): 50 mg via ORAL
  Filled 2024-04-03 (×6): qty 1

## 2024-04-03 NOTE — Progress Notes (Signed)
 Progress Note   [name] FMW:[id number] DOB: [birth year] DOA: 03/31/2024     3 DOS: the patient was seen and examined on 04/03/2024   Brief hospital course: Cynthia Lucas is a 73 y.o. female with medical history significant of  Atrial fibrillation , DMII, GERD, renal stones, Hypertension , OSA not on CPAP, essential tremor , who presents BIB EMS due to failure to thrive at home after son death with family nothing patient appears to be depressed and will not complete her ADLS and has decrease appetite. Upon arrival, patient had tachycardia, tachypnea.  Significant leukocytosis.  Lactic acid level was 4.9. CT scan showed a right-sided kidney stone with hydronephrosis. Patient was seen by urology, ureteral stent was placed.  Blood cultures so far has grew E. coli, she is currently covered with cefepime .  7/2: Fioricet for migraine/unilateral headache with eye pain.  Changed IV antibiotic/Rocephin  to oral amoxicillin  Principal Problem:   Sepsis secondary to UTI [facility]) Active Problems:   Obstructive uropathy   Metabolic acidosis   Atrial fibrillation with rapid ventricular response (HCC)   Uncontrolled type 2 diabetes mellitus with hyperglycemia, without long-term current use of insulin  (HCC)   Severe sepsis (HCC)   Acute pyelonephritis   Right kidney stone   CKD stage 3b, GFR 30-44 ml/min (HCC)   E. coli septicemia (HCC)   Overweight (BMI 25.0-29.9)   Assessment and Plan: Severe sepsis secondary to pyelonephritis E. coli septicemia secondary to pyelonephritis. Acute right sided pyelonephritis secondary to hydronephrosis. Right sided kidney stone with obstruction and hydronephrosis. Patient met severe sepsis criteria with tachycardia, tachypnea, leukocytosis, severe lactic acidosis.  This is a secondary to pyelonephritis. Blood cultures so far growing E. coli.  Urine culture also positive for E. coli.  She is on day 4 IV antibiotics for E coli bacteremia s/p ureteral stent.  the E coli is pan-sensitive -will switch her to oral amoxicllin 1gm TID to complete course (expect 10-14 days total with stent)  Discontinue Foley catheter per urology    Paroxysmal atrial fibrillation with RVR. Elevated troponin secondary to sepsis Patient developed RVR due to severe sepsis. Seen by cardiology, patient is treated with amiodarone and heparin drip. Heparin changed to oral Eliquis    Chronic kidney disease stage IIIb. Mild metabolic acidosis. Hypokalemia resolved. Acute kidney injury ruled out. Reviewed prior labs, patient had chronic kidney disease, no acute exacerbation. Metabolic acidosis resolved   Uncontrolled type 2 diabetes with hyperglycemia Glucose running high, added insulin  glargine.   Overweight. Diet and excise.   Tremor, possibility of Parkinson disease. Patient has essential tremor, this can be followed with PCP and refer to neurology as outpatient. Patient also has significant weakness, PT/OT recommend home health.  Migraine Fioricet as needed     Subjective:  Patient is having left eye pain/headache and not feeling too well.  She reports this is likely due to sleeping disturbances.  Physical Exam: Vitals:   04/03/24 0420 04/03/24 0759 04/03/24 1154 04/03/24 1629  BP: 108/61 (!) 117/59 122/63 115/75  Pulse: 70 66 67 66  Resp: 20 18 18    Temp: 98.2 F (36.8 C) 98.7 F (37.1 C) 98.7 F (37.1 C) 97.8 F (36.6 C)  TempSrc:  Oral Oral   SpO2: 97% 95% 98% 99%  Weight:      Height:       General exam: Appears calm and comfortable  Respiratory system: Clear to auscultation. Respiratory effort normal. Cardiovascular system: Irregular. No JVD, murmurs, rubs, gallops or clicks. No pedal edema.  Gastrointestinal system: Abdomen is soft, benign.  Foley indwelling Central nervous system: Alert and oriented. No focal neurological deficits. Extremities: Symmetric 5 x 5 power. Skin: No rashes, lesions or ulcers Psychiatry: Judgement and insight  appear normal. Mood & affect appropriate.    Data Reviewed:  Lab results reviewed.  Family Communication: None  Disposition: Status is: Inpatient Remains inpatient appropriate because: Severity of disease, IV treatment.     Time spent: 35 minutes  Author: Cresencio Fairly, MD 04/03/2024 7:08 PM  For on call review [email or web address].

## 2024-04-03 NOTE — Plan of Care (Signed)

## 2024-04-03 NOTE — Plan of Care (Signed)
   Problem: Education: Goal: Ability to describe self-care measures that may prevent or decrease complications (Diabetes Survival Skills Education) will improve Outcome: Progressing

## 2024-04-03 NOTE — Progress Notes (Signed)
 Rounding Note   Patient Name: Cynthia Lucas Date of Encounter: 04/03/2024  [facility] Cardiologist: Deatrice Cage, MD   Subjective No acute events overnight, telemetry showing sinus rhythm.  No bleeding issues with Eliquis .  Scheduled Meds:  [START ON 04/04/2024] amoxicillin  1,000 mg Oral Q8H   apixaban   5 mg Oral BID   atorvastatin   40 mg Oral QHS   Chlorhexidine  Gluconate Cloth  6 each Topical QHS   insulin  aspart  0-9 Units Subcutaneous TID WC   insulin  glargine-yfgn  10 Units Subcutaneous Daily   metoprolol  tartrate  12.5 mg Oral Once   metoprolol  tartrate  50 mg Oral BID   polyethylene glycol  17 g Oral Once   Continuous Infusions:  cefTRIAXone  (ROCEPHIN )  IV 2 g (04/02/24 1746)   PRN Meds: albuterol, fentaNYL  (SUBLIMAZE ) injection, ondansetron  **OR** ondansetron  (ZOFRAN ) IV   Vital Signs  Vitals:   04/02/24 2252 04/03/24 0420 04/03/24 0759 04/03/24 1154  BP: 122/88 108/61 (!) 117/59 122/63  Pulse: (!) 112 70 66 67  Resp: 18 20 18 18   Temp: 98.3 F (36.8 C) 98.2 F (36.8 C) 98.7 F (37.1 C) 98.7 F (37.1 C)  TempSrc:   Oral Oral  SpO2: 97% 97% 95% 98%  Weight:      Height:        Intake/Output Summary (Last 24 hours) at 04/03/2024 1205 Last data filed at 04/03/2024 1019 Gross per 24 hour  Intake 240 ml  Output 1700 ml  Net -1460 ml      03/31/2024    6:15 AM 11/01/2022   10:18 AM 04/26/2022    3:13 PM  Last 3 Weights  Weight (lbs) 174 lb 12.8 oz 213 lb 4 oz 202 lb  Weight (kg) 79.289 kg 96.73 kg 91.627 kg      Telemetry Sinus rhythm, heart rate 64- Personally Reviewed  ECG   - Personally Reviewed  Physical Exam  GEN: No acute distress.   Neck: No JVD Cardiac: RRR, no murmurs, rubs, or gallops.  Respiratory: Clear to auscultation bilaterally. GI: Soft, nontender, non-distended  MS: No edema; No deformity. Neuro:  Nonfocal  Psych: Normal affect   Labs High Sensitivity Troponin:   Recent Labs  Lab 03/31/24 0629 03/31/24 0815   TROPONINIHS 88* 50*     Chemistry Recent Labs  Lab 03/31/24 0629 03/31/24 1147 04/01/24 0446 04/02/24 0410 04/03/24 0347  NA 135 138 135 137 138  K 2.9* 3.2* 4.4 5.0 3.7  CL 100 106 106 105 107  CO2 22 21* 21* 25 24  GLUCOSE 359* 209* 261* 212* 140*  BUN 48* 38* 37* 44* 35*  CREATININE 2.64* 1.98* 1.68* 1.63* 1.47*  CALCIUM  8.3* 7.8* 8.0* 8.3* 8.1*  MG 2.0 1.9  --  2.9*  --   PROT 6.7  --  5.8*  --   --   ALBUMIN 3.1*  --  2.5*  --   --   AST 60*  --  22  --   --   ALT 31  --  24  --   --   ALKPHOS 72  --  72  --   --   BILITOT 1.0  --  0.5  --   --   GFRNONAA 19* 26* 32* 33* 37*  ANIONGAP 13 11 8 7 7     Lipids No results for input(s): CHOL, TRIG, HDL, LABVLDL, LDLCALC, CHOLHDL in the last 168 hours.  Hematology Recent Labs  Lab 03/31/24 [id number] 04/01/24 0446 04/02/24 0410  WBC 17.4* 14.7* 13.4*  RBC 4.95 4.18 4.24  HGB 13.0 10.9* 11.3*  HCT 39.7 33.5* 34.5*  MCV 80.2 80.1 81.4  MCH 26.3 26.1 26.7  MCHC 32.7 32.5 32.8  RDW 15.9* 16.2* 16.8*  PLT 161 142* 168   Thyroid No results for input(s): TSH, FREET4 in the last 168 hours.  BNPNo results for input(s): BNP, PROBNP in the last 168 hours.  DDimer No results for input(s): DDIMER in the last 168 hours.   Radiology  No results found.  Cardiac Studies Echo 03/31/2024 EF 55 to 60%  Patient Profile   73 y.o. female with history of paroxysmal atrial fibrillation, hypertension, hyperlipidemia, CKD presenting with abdominal pain, diagnosed with UTI sepsis due to right ureteral stone s/p ureteral stent placement.  Being seen for A-fib RVR.  Assessment & Plan   1.  A-fib RVR - Driven by UTI induced sepsis. - Currently in sinus rhythm. - Consolidate metoprolol , to Lopressor  50 mg twice daily. - Continue Eliquis  5 mg twice daily. - Echo with normal EF.  2. UTI induced sepsis, right ureteral stone - S/p ureteral stent - Antibiotics per IM, urology following.   Patient can be discharged  from a cardiac perspective on current medications when okay from a sepsis standpoint.  No additional cardiac testing or intervention planned.  Close follow-up with primary cardiologist as outpatient.  Signed, Redell Cave, MD  04/03/2024, 12:05 PM

## 2024-04-03 NOTE — Progress Notes (Signed)
 PT Cancellation Note  Patient Details Name: Cynthia Lucas MRN: [id number] DOB: [birth year]   Cancelled Treatment:     PT attempt. Pt was side lying in bed upon arrival. She endorses severe 9/10 pain even at rest.  I need something for pain. Author did let RN know that pt is requesting pain medications. Author will return at a later time this date. Did discuss with pt's sister via phone that she will be unable to assist pt at DC due to having her own personal health/upcoming surgery. Most likely with be updating DC recs to STR once pt is able to fully participate in session.     Rankin KATHEE Essex 04/03/2024, 12:34 PM

## 2024-04-03 NOTE — Progress Notes (Signed)
 Physical Therapy Treatment Patient Details Name: Cynthia Lucas MRN: [id number] DOB: 01/18/1951 Today's Date: 04/03/2024   History of Present Illness Pt admitted for sepsis secondary to UTI with complaints of depression after losing her son, now with decreased mobility/weakness. Pt s/p cystoscopy with stent placed on 03/31/24. PMH includes Afib, DM, HTN, and tremors. Currently on amio drip, cleared to work with her via [employer] fundraiser.    PT Comments  Pt was long sitting in bed upon arrival. She is alert but does have some cognition deficits come to light during session. Pt at one minute endorses severe 10/10 pain that a few seconds later endorses being 0/10. Pt is inconsistent in her PLOF that dino confirmed with sister (pt lives with) Per lengthy discussion with pt/pt's sister, DC recs changed to reflect pt's change from PLOF. Pt does not usually use AD at baseline. Currently have several LOB in a matter of just 8 ft with HHA +1 to prevent falling. Improved with use of RW however pt remains at high risk of falls. Overall, pt vitals tolerated session well but HR was irregular several times during gait training. Due to pt being so far from her baseline abilities and not having available assistance at DC, DC recs changed to STR. She will benefit from the continued skilled PT to maximize her independence and safety with all ADLs while assisting pt to PLOF.    If plan is discharge home, recommend the following: A lot of help with walking and/or transfers;A little help with bathing/dressing/bathroom;Assistance with cooking/housework;Assistance with feeding;Direct supervision/assist for medications management;Direct supervision/assist for financial management;Assist for transportation;Help with stairs or ramp for entrance;Supervision due to cognitive status     Equipment Recommendations  Other (comment) (Defer to next level of care)       Precautions / Restrictions Precautions Precautions: Fall Recall of  Precautions/Restrictions: Intact Restrictions Weight Bearing Restrictions Per Provider Order: No     Mobility  Bed Mobility Overal bed mobility: Needs Assistance Bed Mobility: Supine to Sit, Sit to Supine  Supine to sit: Min assist Sit to supine: Min assist   Transfers Overall transfer level: Needs assistance Equipment used: Rolling walker (2 wheels), None Transfers: Sit to/from Stand Sit to Stand: Min assist, Contact guard assist  General transfer comment: Min assist without AD, CGA for with RW. pt very inconsistent in presentation throughout session.    Ambulation/Gait Ambulation/Gait assistance: +2 safety/equipment, Min assist Gait Distance (Feet): 60 Feet Assistive device: Rolling walker (2 wheels), None Gait Pattern/deviations: Step-to pattern, Ataxic, Narrow base of support Gait velocity: decreased  General Gait Details: pt was able to ambulate with RW with CGA-min however with attempt to ambulate without AD + HHA +1, pt unable with several LOB with intervention to prevent falling   Balance Overall balance assessment: Needs assistance Sitting-balance support: Bilateral upper extremity supported, Feet supported Sitting balance-Leahy Scale: Fair     Standing balance support: Bilateral upper extremity supported, Reliant on assistive device for balance, During functional activity, Single extremity supported Standing balance-Leahy Scale: Poor Standing balance comment: pt is at high risk of falls     Communication Communication Communication: No apparent difficulties  Cognition Arousal: Alert Behavior During Therapy: WFL for tasks assessed/performed   PT - Cognitive impairments: No family/caregiver present to determine baseline    PT - Cognition Comments: pt is alert and cooperative but does have some cogntion deficits come to light throughout session. She endorses pain one minute then no pain another. Following commands: Intact      Cueing Cueing  Techniques: Verbal  cues     General Comments General comments (skin integrity, edema, etc.): confirmed with pt's sister/roomate PLOF. pt is not at her baseline and will not have available assistance at DC due to pt's sister having her own personal medical/surgery. DC recs updated to reflect pt's post acute needs. She will benefit form the increased skilled PT that HHPT can not provide. STR is best DC disposition for safe DC.      Pertinent Vitals/Pain Pain Assessment Pain Assessment: No/denies pain     PT Goals (current goals can now be found in the care plan section) Acute Rehab PT Goals Patient Stated Goal: To go home when able Progress towards PT goals: Not progressing toward goals - comment    Frequency    Min 1X/week       AM-PAC PT 6 Clicks Mobility   Outcome Measure  Help needed turning from your back to your side while in a flat bed without using bedrails?: A Little Help needed moving from lying on your back to sitting on the side of a flat bed without using bedrails?: A Lot Help needed moving to and from a bed to a chair (including a wheelchair)?: A Lot Help needed standing up from a chair using your arms (e.g., wheelchair or bedside chair)?: A Lot Help needed to walk in hospital room?: A Lot Help needed climbing 3-5 steps with a railing? : A Lot 6 Click Score: 13    End of Session   Activity Tolerance: Patient limited by fatigue;Patient limited by pain Patient left: in bed;with call bell/phone within reach;with bed alarm set;with nursing/sitter in room Nurse Communication: Mobility status PT Visit Diagnosis: Difficulty in walking, not elsewhere classified (R26.2);Unsteadiness on feet (R26.81);Ataxic gait (R26.0);Muscle weakness (generalized) (M62.81)     Time: 8577-8555 PT Time Calculation (min) (ACUTE ONLY): 22 min  Charges:    $Gait Training: 8-22 mins PT General Charges $$ ACUTE PT VISIT: 1 Visit                     Rankin Essex PTA 04/03/24, 4:15 PM

## 2024-04-03 NOTE — Progress Notes (Signed)
 OT Cancellation Note  Patient Details Name: Cynthia Lucas MRN: [id number] DOB: 1951-04-08   Cancelled Treatment:    Reason Eval/Treat Not Completed: Pain limiting ability to participate;Patient declined, no reason specified. Chart reviewed, on arrival pt reports 10/10 headache despite recently receiving pain medicine. Defers any therapy at this time, will re-attempt as available.   Elston Slot, M.S. OTR/L  04/03/24, 11:15 AM  ascom [phone]

## 2024-04-04 DIAGNOSIS — I4891 Unspecified atrial fibrillation: Secondary | ICD-10-CM | POA: Diagnosis not present

## 2024-04-04 DIAGNOSIS — N10 Acute tubulo-interstitial nephritis: Secondary | ICD-10-CM | POA: Diagnosis not present

## 2024-04-04 DIAGNOSIS — A419 Sepsis, unspecified organism: Secondary | ICD-10-CM | POA: Diagnosis not present

## 2024-04-04 DIAGNOSIS — N1832 Chronic kidney disease, stage 3b: Secondary | ICD-10-CM | POA: Diagnosis not present

## 2024-04-04 LAB — BASIC METABOLIC PANEL WITH GFR
Anion gap: 11 (ref 5–15)
BUN: 25 mg/dL — ABNORMAL HIGH (ref 8–23)
CO2: 26 mmol/L (ref 22–32)
Calcium: 8.5 mg/dL — ABNORMAL LOW (ref 8.9–10.3)
Chloride: 104 mmol/L (ref 98–111)
Creatinine, Ser: 1.2 mg/dL — ABNORMAL HIGH (ref 0.44–1.00)
GFR, Estimated: 48 mL/min — ABNORMAL LOW (ref >60)
Glucose, Bld: 158 mg/dL — ABNORMAL HIGH (ref 70–99)
Potassium: 4.2 mmol/L (ref 3.5–5.1)
Sodium: 141 mmol/L (ref 135–145)

## 2024-04-04 LAB — CBC
HCT: 32.4 % — ABNORMAL LOW (ref 36.0–46.0)
Hemoglobin: 10.6 g/dL — ABNORMAL LOW (ref 12.0–15.0)
MCH: 26.2 pg (ref 26.0–34.0)
MCHC: 32.7 g/dL (ref 30.0–36.0)
MCV: 80.2 fL (ref 80.0–100.0)
Platelets: 254 10*3/uL (ref 150–400)
RBC: 4.04 MIL/uL (ref 3.87–5.11)
RDW: 16.1 % — ABNORMAL HIGH (ref 11.5–15.5)
WBC: 8.4 10*3/uL (ref 4.0–10.5)
nRBC: 0.2 % (ref 0.0–0.2)

## 2024-04-04 LAB — GLUCOSE, CAPILLARY
Glucose-Capillary: 144 mg/dL — ABNORMAL HIGH (ref 70–99)
Glucose-Capillary: 154 mg/dL — ABNORMAL HIGH (ref 70–99)
Glucose-Capillary: 154 mg/dL — ABNORMAL HIGH (ref 70–99)
Glucose-Capillary: 161 mg/dL — ABNORMAL HIGH (ref 70–99)

## 2024-04-04 MED ORDER — BUTALBITAL-APAP-CAFFEINE 50-325-40 MG PO TABS
1.0000 | ORAL_TABLET | Freq: Once | ORAL | Status: AC
  Administered 2024-04-04: 1 via ORAL
  Filled 2024-04-04: qty 1

## 2024-04-04 NOTE — Progress Notes (Signed)
 Occupational Therapy Treatment Patient Details Name: Shauniece Lucas MRN: [id number] DOB: 01-25-1951 Today's Date: 04/04/2024   History of present illness Pt admitted for sepsis secondary to UTI with complaints of depression after losing her son, now with decreased mobility/weakness. Pt s/p cystoscopy with stent placed on 03/31/24. PMH includes Afib, DM, HTN, and tremors. Currently on amio drip, cleared to work with her via [employer] fundraiser.   OT comments  Cynthia Lucas seen for OT treatment on this date. Upon arrival to room pt in bed, agreeable to tx. Pt requires MIN A for bed mobility; CGA + RW for standing grooming at sink; CGA for toileting. Pt ambulated with CGA + RW, noted ataxic gait and one minor LOB which pt self-corrected. Pt reports severe head pain, deferred further mobility after toileting, however, was motivated to brush her teeth; completed EOB with SETUP assistance.    Pt making good progress toward goals, will continue to follow POC. Due to changes in caregiver support and continued need for assistance, d/c rec updated.        If plan is discharge home, recommend the following:  Assistance with cooking/housework;Help with stairs or ramp for entrance;Assist for transportation;A lot of help with bathing/dressing/bathroom;A lot of help with walking and/or transfers   Equipment Recommendations  Other (comment) (defer to next venue of care)    Recommendations for Other Services      Precautions / Restrictions Precautions Precautions: Fall Recall of Precautions/Restrictions: Intact Restrictions Weight Bearing Restrictions Per Provider Order: No       Mobility Bed Mobility Overal bed mobility: Needs Assistance Bed Mobility: Supine to Sit, Sit to Supine     Supine to sit: Min assist (+ HHA) Sit to supine: Min assist        Transfers Overall transfer level: Needs assistance Equipment used: Rolling walker (2 wheels) Transfers: Sit to/from Stand Sit to Stand: Contact guard assist                  Balance Overall balance assessment: Needs assistance Sitting-balance support: Feet supported, No upper extremity supported Sitting balance-Leahy Scale: Good     Standing balance support: Single extremity supported, During functional activity Standing balance-Leahy Scale: Poor                             ADL either performed or assessed with clinical judgement   ADL Overall ADL's : Needs assistance/impaired                                       General ADL Comments: SETUP for brushing teeth seated EOB, CGA for standing grooming at sink, CGA for toileting    Extremity/Trunk Assessment Upper Extremity Assessment Upper Extremity Assessment: Overall WFL for tasks assessed   Lower Extremity Assessment Lower Extremity Assessment: Generalized weakness        Vision       Perception     Praxis     Communication Communication Communication: No apparent difficulties   Cognition Arousal: Alert Behavior During Therapy: WFL for tasks assessed/performed Cognition: No apparent impairments                               Following commands: Intact        Cueing   Cueing Techniques: Verbal cues, Tactile cues  Exercises  Shoulder Instructions       General Comments HR 74 w/ activity    Pertinent Vitals/ Pain       Pain Assessment Pain Assessment: 0-10 Pain Score: 10-Worst pain ever Pain Location: head Pain Descriptors / Indicators: Grimacing, Headache, Moaning Pain Intervention(s): Limited activity within patient's tolerance, Monitored during session, Repositioned, Patient requesting pain meds-RN notified  Home Living                                          Prior Functioning/Environment              Frequency  Min 2X/week        Progress Toward Goals  OT Goals(current goals can now be found in the care plan section)  Progress towards OT goals: Progressing toward  goals  Acute Rehab OT Goals Patient Stated Goal: to go home OT Goal Formulation: With patient Time For Goal Achievement: 04/18/24 Potential to Achieve Goals: Good ADL Goals Pt Will Perform Grooming: with set-up;with supervision;standing Pt Will Perform Lower Body Dressing: with modified independence;sit to/from stand Pt Will Transfer to Toilet: with modified independence;ambulating;regular height toilet  Plan      Co-evaluation                 AM-PAC OT 6 Clicks Daily Activity     Outcome Measure   Help from another person eating meals?: A Little Help from another person taking care of personal grooming?: A Little Help from another person toileting, which includes using toliet, bedpan, or urinal?: A Little Help from another person bathing (including washing, rinsing, drying)?: A Lot Help from another person to put on and taking off regular upper body clothing?: A Little Help from another person to put on and taking off regular lower body clothing?: A Little 6 Click Score: 17    End of Session Equipment Utilized During Treatment: Gait belt;Rolling walker (2 wheels)  OT Visit Diagnosis: Other abnormalities of gait and mobility (R26.89);Muscle weakness (generalized) (M62.81)   Activity Tolerance Patient tolerated treatment well;Patient limited by pain   Patient Left in bed;with call bell/phone within reach;with bed alarm set   Nurse Communication          Time: 8678-8653 OT Time Calculation (min): 25 min  Charges: OT General Charges $OT Visit: 1 Visit OT Treatments $Self Care/Home Management : 23-37 mins  Cynthia Lucas, Student OT   [employer] 04/04/2024, 3:36 PM

## 2024-04-04 NOTE — Progress Notes (Signed)
 Progress Note   [name] FMW:[id number] DOB: 09/26/1951 DOA: 03/31/2024     4 DOS: the patient was seen and examined on 04/04/2024   Brief hospital course: Cynthia Lucas is a 73 y.o. female with medical history significant of  Atrial fibrillation , DMII, GERD, renal stones, Hypertension , OSA not on CPAP, essential tremor , who presents BIB EMS due to failure to thrive at home after son death with family nothing patient appears to be depressed and will not complete her ADLS and has decrease appetite. Upon arrival, patient had tachycardia, tachypnea.  Significant leukocytosis.  Lactic acid level was 4.9. CT scan showed a right-sided kidney stone with hydronephrosis. Patient was seen by urology, ureteral stent was placed.  Blood cultures so far has grew E. coli, she is currently covered with cefepime .  7/2: Fioricet for migraine/unilateral headache with eye pain.  Changed IV antibiotic/Rocephin  to oral amoxicillin 7/3: waiting for SNF  Principal Problem:   Sepsis secondary to UTI [facility]) Active Problems:   Obstructive uropathy   Metabolic acidosis   Atrial fibrillation with rapid ventricular response (HCC)   Uncontrolled type 2 diabetes mellitus with hyperglycemia, without long-term current use of insulin  (HCC)   Severe sepsis (HCC)   Acute pyelonephritis   Right kidney stone   CKD stage 3b, GFR 30-44 ml/min (HCC)   E. coli septicemia (HCC)   Overweight (BMI 25.0-29.9)   Assessment and Plan: Severe sepsis secondary to pyelonephritis E. coli septicemia secondary to pyelonephritis. Acute right sided pyelonephritis secondary to hydronephrosis. Right sided kidney stone with obstruction and hydronephrosis. Patient met severe sepsis criteria with tachycardia, tachypnea, leukocytosis, severe lactic acidosis.  This is a secondary to pyelonephritis. Blood cultures so far growing E. coli.  Urine culture also positive for E. coli.  She is on day 4 IV antibiotics for E coli  bacteremia s/p ureteral stent. the E coli is pan-sensitive -will switch her to oral amoxicllin 1gm TID to complete course (expect 10-14 days total with stent)  Discontinued Foley catheter per urology on 7/2   Paroxysmal atrial fibrillation with RVR. Elevated troponin secondary to sepsis Patient developed RVR due to severe sepsis. Seen by cardiology, patient was initially treated with amiodarone and heparin drip. Now transtioned to oral Eliquis  and metoprolol    Chronic kidney disease stage IIIb. Mild metabolic acidosis. Hypokalemia resolved. Acute kidney injury ruled out. Reviewed prior labs, patient had chronic kidney disease, no acute exacerbation. Metabolic acidosis resolved   Uncontrolled type 2 diabetes with hyperglycemia continue insulin  glargine.   Overweight. Diet and excise.   Tremor, possibility of Parkinson disease. Patient has essential tremor, this can be followed with PCP and refer to neurology as outpatient. Patient also has significant weakness, PT/OT recommend SNF - TOC aware and working on placement  Migraine Fioricet as needed     Subjective:   Still has some intermittent Migraine   Physical Exam: Vitals:   04/04/24 0740 04/04/24 1134 04/04/24 1247 04/04/24 1502  BP: 137/72 129/60  (!) 121/56  Pulse: 68 63  68  Resp: 16 16 16 18   Temp: 98.5 F (36.9 C) 98.4 F (36.9 C)  98.6 F (37 C)  TempSrc:      SpO2: 93% 97%  97%  Weight:      Height:       General exam: Appears calm and comfortable  Respiratory system: Clear to auscultation. Respiratory effort normal. Cardiovascular system: Irregular. No JVD, murmurs, rubs, gallops or clicks. No pedal edema. Gastrointestinal system: Abdomen is soft, benign.  Foley indwelling Central nervous system: Alert and oriented. No focal neurological deficits. Extremities: Symmetric 5 x 5 power. Skin: No rashes, lesions or ulcers Psychiatry: Judgement and insight appear normal. Mood & affect appropriate.     Data Reviewed:  Lab results reviewed.  Family Communication: None  Disposition: Status is: Inpatient Remains inpatient appropriate because: medically stable. Waiting for SNF     Time spent: 35 minutes  Author: Cresencio Fairly, MD 04/04/2024 5:07 PM  For on call review [email or web address].

## 2024-04-04 NOTE — NC FL2 (Signed)
   MEDICAID FL2 LEVEL OF CARE FORM     IDENTIFICATION  Patient Name: Cynthia Lucas Birthdate: 01-31-1951 Sex: female Admission Date (Current Location): 03/31/2024  [facility] and [place] Number:  Chiropodist and Address:  [facility], [address] [place], [place] [address]      Provider Number: [id number]  Attending Physician Name and Address:  Maree Hue, MD  Relative Name and Phone Number:       Current Level of Care: Hospital Recommended Level of Care: Skilled Nursing Facility Prior Approval Number:    Date Approved/Denied:   PASRR Number: [id number]  Discharge Plan: SNF    Current Diagnoses: Patient Active Problem List   Diagnosis Date Noted   CKD stage 3b, 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   Obstructive uropathy 01/07/2019    Orientation RESPIRATION BLADDER Height & Weight     Self, Time, Situation, Place  Normal Incontinent (Ureteral drain/stent) Weight: 189 lb 13.1 oz (86.1 kg) Height:  5' 7 (170.2 cm)  BEHAVIORAL SYMPTOMS/MOOD NEUROLOGICAL BOWEL NUTRITION STATUS   (None)  (None) Continent Diet (Heart healthy/carb modified)  AMBULATORY STATUS COMMUNICATION OF NEEDS Skin   Limited Assist Verbally Normal                       Personal Care Assistance Level of Assistance  Bathing, Dressing,  Feeding Bathing Assistance: Limited assistance Feeding assistance: Limited assistance Dressing Assistance: Limited assistance     Functional Limitations Info  Sight, Hearing, Speech Sight Info: Adequate Hearing Info: Adequate Speech Info: Adequate    SPECIAL CARE FACTORS FREQUENCY  PT (By licensed PT), OT (By licensed OT)     PT Frequency: 5 x week OT Frequency: 5 x week            Contractures Contractures Info: Not present    Additional Factors Info  Code Status, Allergies Code Status Info: Full code Allergies Info: Nickel, Contact lense solution           Current Medications (04/04/2024):  This is the current hospital active medication list Current Facility-Administered Medications  Medication Dose Route Frequency Provider Last Rate Last Admin   albuterol (PROVENTIL) (2.5 MG/3ML) 0.083% nebulizer solution 2.5 mg  2.5 mg Nebulization Q2H PRN Debby Camila LABOR, MD       amoxicillin (AMOXIL) capsule 1,000 mg  1,000 mg Oral Q8H Shah, Vipul, MD   1,000 mg at 04/04/24 1328   apixaban  (ELIQUIS ) tablet 5 mg  5 mg Oral BID End, Christopher, MD   5 mg at 04/04/24 0901   atorvastatin  (LIPITOR) tablet 40 mg  40 mg Oral QHS Thomas, Sara-Maiz A, MD   40 mg at 04/03/24 2017   butalbital-acetaminophen -caffeine (FIORICET) 50-325-40 MG per tablet 1 tablet  1 tablet Oral Q6H PRN Shah, Vipul, MD   1 tablet at 04/04/24 1202   butalbital-acetaminophen -caffeine (FIORICET) 50-325-40 MG per tablet 1 tablet  1 tablet Oral Once Maree Hue, MD       Chlorhexidine  Gluconate Cloth 2 % PADS 6 each  6 each Topical QHS Debby Camila LABOR, MD   6 each at 04/03/24 2018   insulin  aspart (novoLOG ) injection 0-9 Units  0-9 Units Subcutaneous TID WC Debby Camila LABOR, MD   2 Units at 04/04/24 1223   insulin  glargine-yfgn (SEMGLEE ) injection 10 Units  10 Units Subcutaneous Daily Zhang, Dekui, MD   10 Units at 04/04/24 9097   metoprolol  tartrate (LOPRESSOR ) tablet 50 mg  50 mg Oral BID Carey, Mariah L,  PA-C   50 mg at 04/04/24 0901   ondansetron  (ZOFRAN ) tablet 4 mg  4 mg Oral Q6H PRN Debby Camila LABOR, MD       Or   ondansetron  (ZOFRAN ) injection 4 mg  4 mg Intravenous Q6H PRN Thomas, Sara-Maiz A, MD         Discharge Medications: Please see discharge summary for a list of discharge medications.  Relevant Imaging Results:  Relevant Lab Results:   Additional Information SS#: [id number]  Lauraine JAYSON Carpen, LCSW

## 2024-04-04 NOTE — Plan of Care (Signed)
  Problem: Education: Goal: Ability to describe self-care measures that may prevent or decrease complications (Diabetes Survival Skills Education) will improve Outcome: Progressing Goal: Individualized Educational Video(s) Outcome: Progressing   Problem: Coping: Goal: Ability to adjust to condition or change in health will improve Outcome: Progressing   Problem: Fluid Volume: Goal: Ability to maintain a balanced intake and output will improve Outcome: Progressing   Problem: Metabolic: Goal: Ability to maintain appropriate glucose levels will improve Outcome: Progressing   Problem: Health Behavior/Discharge Planning: Goal: Ability to identify and utilize available resources and services will improve Outcome: Progressing Goal: Ability to manage health-related needs will improve Outcome: Progressing   Problem: Nutritional: Goal: Maintenance of adequate nutrition will improve Outcome: Progressing Goal: Progress toward achieving an optimal weight will improve Outcome: Progressing   Problem: Skin Integrity: Goal: Risk for impaired skin integrity will decrease Outcome: Progressing   Problem: Tissue Perfusion: Goal: Adequacy of tissue perfusion will improve Outcome: Progressing   Problem: Education: Goal: Knowledge of General Education information will improve Description: Including pain rating scale, medication(s)/side effects and non-pharmacologic comfort measures Outcome: Progressing   Problem: Health Behavior/Discharge Planning: Goal: Ability to manage health-related needs will improve Outcome: Progressing   Problem: Clinical Measurements: Goal: Ability to maintain clinical measurements within normal limits will improve Outcome: Progressing Goal: Will remain free from infection Outcome: Progressing Goal: Diagnostic test results will improve Outcome: Progressing Goal: Respiratory complications will improve Outcome: Progressing Goal: Cardiovascular complication will  be avoided Outcome: Progressing   Problem: Activity: Goal: Risk for activity intolerance will decrease Outcome: Progressing   Problem: Coping: Goal: Level of anxiety will decrease Outcome: Progressing   Problem: Nutrition: Goal: Adequate nutrition will be maintained Outcome: Progressing   Problem: Elimination: Goal: Will not experience complications related to bowel motility Outcome: Progressing Goal: Will not experience complications related to urinary retention Outcome: Progressing   Problem: Pain Managment: Goal: General experience of comfort will improve and/or be controlled Outcome: Progressing   Problem: Safety: Goal: Ability to remain free from injury will improve Outcome: Progressing   Problem: Skin Integrity: Goal: Risk for impaired skin integrity will decrease Outcome: Progressing

## 2024-04-04 NOTE — TOC Initial Note (Signed)
 Transition of Care [facility]) - Initial/Assessment Note    Patient Details  Name: Cynthia Lucas MRN: [id number] Date of Birth: 23-Feb-1951  Transition of Care [facility]) CM/SW Contact:    Lauraine JAYSON Carpen, LCSW Phone Number: 04/04/2024, 1:48 PM  Clinical Narrative:  CSW met with patient. No family at bedside. CSW introduced role and explained that PT recommendations would be discussed. Patient is agreeable to SNF placement. No facility preference. No further concerns. CSW will continue to follow patient for support and facilitate discharge to SNF once medically stable.               Expected Discharge Plan: Skilled Nursing Facility Barriers to Discharge: Continued Medical Work up   Patient Goals and CMS Choice            Expected Discharge Plan and Services     Post Acute Care Choice: Skilled Nursing Facility Living arrangements for the past 2 months: Single Family Home                                      Prior Living Arrangements/Services Living arrangements for the past 2 months: Single Family Home Lives with:: Siblings Patient language and need for interpreter reviewed:: Yes Do you feel safe going back to the place where you live?: Yes      Need for Family Participation in Patient Care: Yes (Comment) Care giver support system in place?: Yes (comment)   Criminal Activity/Legal Involvement Pertinent to Current Situation/Hospitalization: No - Comment as needed  Activities of Daily Living   ADL Screening (condition at time of admission) Is the patient deaf or have difficulty hearing?: No Does the patient have difficulty seeing, even when wearing glasses/contacts?: No Does the patient have difficulty concentrating, remembering, or making decisions?: No  Permission Sought/Granted Permission sought to share information with : Facility Industrial/product designer granted to share information with : Yes, Verbal Permission Granted     Permission granted to share info w  AGENCY: SNF's        Emotional Assessment Appearance:: Appears stated age Attitude/Demeanor/Rapport: Engaged Affect (typically observed): Accepting, Appropriate, Calm Orientation: : Oriented to Self, Oriented to Place, Oriented to  Time, Oriented to Situation Alcohol / Substance Use: Not Applicable Psych Involvement: No (comment)  Admission diagnosis:  Kidney stone [N20.0] Generalized abdominal pain [R10.84] Atrial fibrillation with rapid ventricular response (HCC) [I48.91] Generalized weakness [R53.1] Sepsis secondary to UTI (HCC) [A41.9, N39.0] Acute pyelonephritis [N10] Sepsis, due to unspecified organism, unspecified whether acute organ dysfunction present [facility]) [A41.9] Patient Active Problem List   Diagnosis Date Noted   CKD stage 3b, 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   Obstructive uropathy 01/07/2019   PCP:  Sampson Ethridge LABOR, MD Pharmacy:   [id number] - [place] - [address] [place]  [address] Phone: [phone] Fax: [phone]     Social Drivers of Health (SDOH) Social History: SDOH Screenings   Food Insecurity: Patient Declined (03/31/2024)  Housing: Patient Declined (03/31/2024)  Recent Concern: Housing - High Risk (02/14/2024)   Received from [facility]  Transportation Needs: Patient Declined (03/31/2024)  Utilities: Patient Declined (03/31/2024)  Recent Concern: Utilities - At Risk (02/14/2024)   Received from [facility]  Financial Resource Strain: Low Risk  (02/14/2024)   Received from [facility]  Social Connections: Patient Declined (03/31/2024)  Tobacco Use: Low Risk  (03/31/2024)   SDOH Interventions: Food Insecurity Interventions: Intervention Not Indicated Housing Interventions: Intervention Not Indicated Transportation Interventions: Intervention Not Indicated Utilities Interventions: Intervention Not Indicated Social Connections Interventions: Intervention Not Indicated   Readmission Risk Interventions     No data to display

## 2024-04-05 DIAGNOSIS — A419 Sepsis, unspecified organism: Secondary | ICD-10-CM | POA: Diagnosis not present

## 2024-04-05 DIAGNOSIS — N10 Acute tubulo-interstitial nephritis: Secondary | ICD-10-CM | POA: Diagnosis not present

## 2024-04-05 DIAGNOSIS — I4891 Unspecified atrial fibrillation: Secondary | ICD-10-CM | POA: Diagnosis not present

## 2024-04-05 DIAGNOSIS — N1832 Chronic kidney disease, stage 3b: Secondary | ICD-10-CM | POA: Diagnosis not present

## 2024-04-05 LAB — GLUCOSE, CAPILLARY
Glucose-Capillary: 154 mg/dL — ABNORMAL HIGH (ref 70–99)
Glucose-Capillary: 169 mg/dL — ABNORMAL HIGH (ref 70–99)
Glucose-Capillary: 132 mg/dL — ABNORMAL HIGH (ref 70–99)
Glucose-Capillary: 216 mg/dL — ABNORMAL HIGH (ref 70–99)

## 2024-04-05 MED ORDER — IBUPROFEN 400 MG PO TABS
400.0000 mg | ORAL_TABLET | Freq: Once | ORAL | Status: AC
  Administered 2024-04-05: 400 mg via ORAL
  Filled 2024-04-05: qty 1

## 2024-04-05 NOTE — Progress Notes (Signed)
 Progress Note   [name] FMW:[id number] DOB: Oct 03, 1951 DOA: 03/31/2024     5 DOS: the patient was seen and examined on 04/05/2024   Brief hospital course: Tasneem Cormier is a 73 y.o. female with medical history significant of  Atrial fibrillation , DMII, GERD, renal stones, Hypertension , OSA not on CPAP, essential tremor , who presents BIB EMS due to failure to thrive at home after son death with family nothing patient appears to be depressed and will not complete her ADLS and has decrease appetite. Upon arrival, patient had tachycardia, tachypnea.  Significant leukocytosis.  Lactic acid level was 4.9. CT scan showed a right-sided kidney stone with hydronephrosis. Patient was seen by urology, ureteral stent was placed.  Blood cultures so far has grew E. coli, she is currently covered with cefepime .  7/2: Fioricet  for migraine/unilateral headache with eye pain.  Changed IV antibiotic/Rocephin  to oral amoxicillin  7/3: waiting for SNF 7/4: [facility] approved. SNF possibly to take her tomorrow  Principal Problem:   Sepsis secondary to UTI [facility]) Active Problems:   Obstructive uropathy   Metabolic acidosis   Atrial fibrillation with rapid ventricular response (HCC)   Uncontrolled type 2 diabetes mellitus with hyperglycemia, without long-term current use of insulin  (HCC)   Severe sepsis (HCC)   Acute pyelonephritis   Right kidney stone   CKD stage 3b, GFR 30-44 ml/min (HCC)   E. coli septicemia (HCC)   Overweight (BMI 25.0-29.9)   Assessment and Plan: Severe sepsis secondary to pyelonephritis E. coli septicemia secondary to pyelonephritis. Acute right sided pyelonephritis secondary to hydronephrosis. Right sided kidney stone with obstruction and hydronephrosis. Patient met severe sepsis criteria with tachycardia, tachypnea, leukocytosis, severe lactic acidosis.  This is a secondary to pyelonephritis. Blood cultures so far growing E. coli.  Urine culture also positive  for E. coli.  She is on day 4 IV antibiotics for E coli bacteremia s/p ureteral stent. the E coli is pan-sensitive -will switch her to oral amoxicllin 1gm TID to complete course (expect 10-14 days total with stent)  Discontinued Foley catheter per urology on 7/2   Paroxysmal atrial fibrillation with RVR. Elevated troponin secondary to sepsis Patient developed RVR due to severe sepsis. Seen by cardiology, patient was initially treated with amiodarone  and heparin  drip. Now transtioned to oral Eliquis  and metoprolol    Chronic kidney disease stage IIIb. Mild metabolic acidosis. Hypokalemia resolved. Acute kidney injury ruled out. Reviewed prior labs, patient had chronic kidney disease, no acute exacerbation. Metabolic acidosis resolved   Uncontrolled type 2 diabetes with hyperglycemia continue insulin  glargine.   Overweight. Diet and excise.   Tremor, possibility of Parkinson disease. Patient has essential tremor, this can be followed with PCP and refer to neurology as outpatient. Patient also has significant weakness, PT/OT recommend SNF -now has [facility].  Possible discharge to [facility] tomorrow  Migraine Fioricet  as needed     Subjective:   Feeling much better  Physical Exam: Vitals:   04/04/24 2322 04/05/24 0349 04/05/24 0755 04/05/24 1158  BP: 136/77 138/75 (!) 131/95 135/65  Pulse: 70 65 69 66  Resp: 19 17 16 16   Temp: 98.6 F (37 C) 98.7 F (37.1 C) 98.7 F (37.1 C) 97.9 F (36.6 C)  TempSrc: Oral Oral    SpO2: 100% 99% 97% 98%  Weight:      Height:       General exam: Appears calm and comfortable  Respiratory system: Clear to auscultation. Respiratory effort normal. Cardiovascular system: Irregular. No JVD, murmurs,  rubs, gallops or clicks. No pedal edema. Gastrointestinal system: Abdomen is soft, benign.   Central nervous system: Alert and oriented. No focal neurological deficits. Extremities: Symmetric 5 x 5 power. Skin: No rashes, lesions or  ulcers Psychiatry: Judgement and insight appear normal. Mood & affect appropriate.    Data Reviewed:  Lab results reviewed.  Family Communication: None  Disposition: Status is: Inpatient Remains inpatient appropriate because: medically stable. Waiting for SNF     Time spent: 35 minutes  Author: Cresencio Fairly, MD 04/05/2024 1:54 PM  For on call review [email or web address].

## 2024-04-05 NOTE — Progress Notes (Signed)
 Occupational Therapy Treatment Patient Details Name: Cynthia Lucas MRN: [id number] DOB: Sep 26, 1951 Today's Date: 04/05/2024   History of present illness Pt admitted for sepsis secondary to UTI with complaints of depression after losing her son, now with decreased mobility/weakness. Pt s/p cystoscopy with stent placed on 03/31/24. PMH includes Afib, DM, HTN, and tremors. Currently on amio drip, cleared to work with her via [employer] fund[employer].   OT comments  Pt seen for OT tx. Pt agreeable, completes bed mobility without direct assist. CGA-MIN A for ADL transfers with RW and ambulates to the bathroom for toileting. Pt completed pericare in sitting, supv for transfer with use of grab bar, and then ambulated back to bed. Pt endorsing 10/10 perceived rate of exertion with toileting. Pt educated in activity pacing and home/routines modifications to ensure decreased energy expenditure on essential activities and minimize falls risk. Pt verbalized understanding and continues to benefit from skilled OT services.       If plan is discharge home, recommend the following:  Assistance with cooking/housework;Help with stairs or ramp for entrance;Assist for transportation;A lot of help with bathing/dressing/bathroom;A lot of help with walking and/or transfers   Equipment Recommendations  Other (comment) (defer)    Recommendations for Other Services      Precautions / Restrictions Precautions Precautions: Fall Recall of Precautions/Restrictions: Intact Restrictions Weight Bearing Restrictions Per Provider Order: No       Mobility Bed Mobility Overal bed mobility: Needs Assistance Bed Mobility: Supine to Sit, Sit to Supine     Supine to sit: Supervision Sit to supine: Supervision   General bed mobility comments: incr time/effort 2/2 tremors but no direct assist required    Transfers Overall transfer level: Needs assistance Equipment used: Rolling walker (2 wheels) Transfers: Sit to/from Stand Sit to Stand:  Contact guard assist                 Balance Overall balance assessment: Needs assistance Sitting-balance support: Feet supported, No upper extremity supported Sitting balance-Leahy Scale: Good     Standing balance support: Bilateral upper extremity supported, Reliant on assistive device for balance, During functional activity Standing balance-Leahy Scale: Fair                             ADL either performed or assessed with clinical judgement   ADL Overall ADL's : Needs assistance/impaired     Grooming: Sitting;Set up;Wash/dry hands               Lower Body Dressing: Sitting/lateral leans;Moderate assistance Lower Body Dressing Details (indicate cue type and reason): don socks EOB Toilet Transfer: Contact guard assist;Rolling walker (2 wheels);Regular Toilet;Ambulation;Minimal assistance   Toileting- Clothing Manipulation and Hygiene: Modified independent;Sitting/lateral lean       Functional mobility during ADLs: Contact guard assist;Minimal assistance;Rolling walker (2 wheels)      Extremity/Trunk Assessment              Vision       Perception     Praxis     Communication Communication Communication: No apparent difficulties   Cognition Arousal: Alert Behavior During Therapy: WFL for tasks assessed/performed Cognition: No apparent impairments                               Following commands: Intact        Cueing   Cueing Techniques: Verbal cues  Exercises  Shoulder Instructions       General Comments      Pertinent Vitals/ Pain       Pain Assessment Pain Assessment: No/denies pain  Home Living                                          Prior Functioning/Environment              Frequency  Min 2X/week        Progress Toward Goals  OT Goals(current goals can now be found in the care plan section)  Progress towards OT goals: Progressing toward goals  Acute Rehab OT  Goals Patient Stated Goal: go home OT Goal Formulation: With patient Time For Goal Achievement: 04/18/24 Potential to Achieve Goals: Good  Plan      Co-evaluation                 AM-PAC OT 6 Clicks Daily Activity     Outcome Measure   Help from another person eating meals?: None Help from another person taking care of personal grooming?: A Little Help from another person toileting, which includes using toliet, bedpan, or urinal?: A Little Help from another person bathing (including washing, rinsing, drying)?: A Lot Help from another person to put on and taking off regular upper body clothing?: A Little Help from another person to put on and taking off regular lower body clothing?: A Lot 6 Click Score: 17    End of Session Equipment Utilized During Treatment: Gait belt;Rolling walker (2 wheels)  OT Visit Diagnosis: Other abnormalities of gait and mobility (R26.89);Muscle weakness (generalized) (M62.81)   Activity Tolerance Patient tolerated treatment well   Patient Left in bed;with call bell/phone within reach;with bed alarm set   Nurse Communication          Time: 1200-1210 OT Time Calculation (min): 10 min  Charges: OT General Charges $OT Visit: 1 Visit OT Treatments $Self Care/Home Management : 8-22 mins  Warren SAUNDERS., MPH, MS, OTR/L ascom [phone] 04/05/24, 1:27 PM

## 2024-04-05 NOTE — TOC Progression Note (Addendum)
 Transition of Care [facility]) - Progression Note    Patient Details  Name: Cynthia Lucas MRN: [id number] Date of Birth: 1951-06-13  Transition of Care [facility]) CM/SW Contact  Lauraine JAYSON Carpen, LCSW Phone Number: 04/05/2024, 10:35 AM  Clinical Narrative:  Reviewed SNF bed offers and star ratings with patient. She has chosen [facility]. Left message for admissions coordinator to notify and see if bed will be ready once auth obtained.   11:17 am: [facility] cannot accept today but should be able to accept over the weekend. MD and RN are aware. CSW started insurance authorization.  12:58 pm: [name] is approved: [id number]. Valid 7/5-7/8. Left message for [facility] admissions coordinator to notify.  Expected Discharge Plan: Skilled Nursing Facility Barriers to Discharge: Continued Medical Work up  Expected Discharge Plan and Services     Post Acute Care Choice: Skilled Nursing Facility Living arrangements for the past 2 months: Single Family Home                                       Social Determinants of Health (SDOH) Interventions SDOH Screenings   Food Insecurity: Patient Declined (03/31/2024)  Housing: Patient Declined (03/31/2024)  Recent Concern: Housing - High Risk (02/14/2024)   Received from [facility]  Transportation Needs: Patient Declined (03/31/2024)  Utilities: Patient Declined (03/31/2024)  Recent Concern: Utilities - At Risk (02/14/2024)   Received from [facility]  Financial Resource Strain: Low Risk  (02/14/2024)   Received from [facility]  Social Connections: Patient Declined (03/31/2024)  Tobacco Use: Low Risk  (03/31/2024)    Readmission Risk Interventions     No data to display

## 2024-04-05 NOTE — Plan of Care (Signed)

## 2024-04-06 DIAGNOSIS — N1832 Chronic kidney disease, stage 3b: Secondary | ICD-10-CM | POA: Diagnosis not present

## 2024-04-06 DIAGNOSIS — I4891 Unspecified atrial fibrillation: Secondary | ICD-10-CM | POA: Diagnosis not present

## 2024-04-06 DIAGNOSIS — N10 Acute tubulo-interstitial nephritis: Secondary | ICD-10-CM | POA: Diagnosis not present

## 2024-04-06 DIAGNOSIS — A419 Sepsis, unspecified organism: Secondary | ICD-10-CM | POA: Diagnosis not present

## 2024-04-06 LAB — GLUCOSE, CAPILLARY
Glucose-Capillary: 140 mg/dL — ABNORMAL HIGH (ref 70–99)
Glucose-Capillary: 174 mg/dL — ABNORMAL HIGH (ref 70–99)

## 2024-04-06 MED ORDER — METOPROLOL TARTRATE 50 MG PO TABS
50.0000 mg | ORAL_TABLET | Freq: Two times a day (BID) | ORAL | Status: DC
Start: 2024-04-06 — End: 2024-05-22

## 2024-04-06 MED ORDER — AMOXICILLIN 500 MG PO CAPS: 1000.0000 mg | ORAL_CAPSULE | Freq: Three times a day (TID) | ORAL | Status: AC

## 2024-04-06 NOTE — Discharge Summary (Signed)
 Physician Discharge Summary   [name] MRN: [id number] DOB: 1950-11-01  Admit date:     03/31/2024  Discharge date: 04/06/24  Discharge Physician: Cresencio Fairly   PCP: Sampson Ethridge LABOR, MD   Recommendations at discharge:    F/up with outpt providers as requested  Discharge Diagnoses: Principal Problem:   Sepsis secondary to UTI [facility]) Active Problems:   Obstructive uropathy   Metabolic acidosis   Atrial fibrillation with rapid ventricular response (HCC)   Uncontrolled type 2 diabetes mellitus with hyperglycemia, without long-term current use of insulin  (HCC)   Severe sepsis (HCC)   Acute pyelonephritis   Right kidney stone   CKD stage 3b, GFR 30-44 ml/min (HCC)   E. coli septicemia (HCC)   Overweight (BMI 25.0-29.9)  Hospital Course: Assessment and Plan:  Cynthia Lucas is a 73 y.o. female with medical history significant of  Atrial fibrillation , DMII, GERD, renal stones, Hypertension , OSA not on CPAP, essential tremor , who presents BIB EMS due to failure to thrive at home after son death with family nothing patient appears to be depressed and will not complete her ADLS and has decrease appetite. Upon arrival, patient had tachycardia, tachypnea.  Significant leukocytosis.  Lactic acid level was 4.9. CT scan showed a right-sided kidney stone with hydronephrosis. Patient was seen by urology, ureteral stent was placed.  Blood cultures so far has grew E. coli, she is currently covered with cefepime .   7/2: Fioricet  for migraine/unilateral headache with eye pain.  Changed IV antibiotic/Rocephin  to oral amoxicillin  7/3: waiting for SNF 7/4: [employer]. SNF possibly to take her tomorrow   Severe sepsis secondary to pyelonephritis E. coli septicemia secondary to pyelonephritis. Acute right sided pyelonephritis secondary to hydronephrosis. Right sided kidney stone with obstruction and hydronephrosis. Patient met severe sepsis criteria with  tachycardia, tachypnea, leukocytosis, severe lactic acidosis.  This is a secondary to pyelonephritis. Blood cultures so far growing E. coli.  Urine culture also positive for E. coli.  She is treated for E coli bacteremia s/p ureteral stent. the E coli is pan-sensitive - so switched her to oral amoxicllin 1gm TID to complete course (expect 10-14 days total with stent)  Discontinued Foley catheter per urology on 7/2 Outpt f/up with Urology - Dr. Alvaro will contact her next week to arrange outpatient ureteroscopy.    Paroxysmal atrial fibrillation with RVR. Elevated troponin secondary to sepsis Patient developed RVR due to severe sepsis. Seen by cardiology, patient was initially treated with amiodarone  and heparin  drip. Now transtioned to oral Eliquis  and metoprolol    Chronic kidney disease stage IIIb. Mild metabolic acidosis. Hypokalemia resolved. Metabolic acidosis resolved   Uncontrolled type 2 diabetes with hyperglycemia Overweight. Diet and excise.   Tremor, possibility of Parkinson disease. Patient has essential tremor, this can be followed with PCP and refer to neurology as outpatient. Patient also has significant weakness, PT/OT recommend SNF - going to [facility]           Consultants: Urology Procedures performed: Cystoscopy with right retrograde pyelogram interpretation & Right ureteral stent placement on 6/29 by Dr Alvaro Disposition: Skilled nursing facility Diet recommendation:  Discharge Diet Orders (From admission, onward)     Start     Ordered   04/06/24 0000  Diet - low sodium heart healthy        04/06/24 0948           Carb modified diet DISCHARGE MEDICATION: Allergies as of 04/06/2024       Reactions  Nickel Itching, Swelling   Other    Contact lense solution causes eye irritation        Medication List     STOP taking these medications    amoxicillin -clavulanate 875-125 MG tablet Commonly known as: AUGMENTIN   atropine 1 % ophthalmic  solution   brimonidine -timolol  0.2-0.5 % ophthalmic solution Commonly known as: COMBIGAN    ciprofloxacin  0.3 % ophthalmic solution Commonly known as: CILOXAN    Jardiance  25 MG Tabs tablet Generic drug: empagliflozin    Lumigan 0.01 % Soln Generic drug: bimatoprost   metoprolol  succinate 25 MG 24 hr tablet Commonly known as: TOPROL -XL   tamsulosin  0.4 MG Caps capsule Commonly known as: FLOMAX    terbinafine 250 MG tablet Commonly known as: LAMISIL       TAKE these medications    amoxicillin  500 MG capsule Commonly known as: AMOXIL  Take 2 capsules (1,000 mg total) by mouth every 8 (eight) hours for 8 days.   apixaban  5 MG Tabs tablet Commonly known as: ELIQUIS  Take 1 tablet (5 mg total) by mouth 2 (two) times daily. Blood thinner.   atorvastatin  40 MG tablet Commonly known as: LIPITOR Take 1 tablet (40 mg total) by mouth at bedtime. Overdue follow visit.  PLEASE CALL OFFICE TO SCHEDULE APPOINTMENT PRIOR TO NEXT REFILL   erythromycin ophthalmic ointment Place 1 Application into the left eye 3 (three) times daily.   hydrochlorothiazide  12.5 MG tablet Commonly known as: HYDRODIURIL  Take 12.5 mg by mouth daily. What changed: Another medication with the same name was removed. Continue taking this medication, and follow the directions you see here.   metoprolol  tartrate 50 MG tablet Commonly known as: LOPRESSOR  Take 1 tablet (50 mg total) by mouth 2 (two) times daily.   prednisoLONE acetate 1 % ophthalmic suspension Commonly known as: PRED FORTE Place 1 drop into the left eye in the morning, at noon, in the evening, and at bedtime.   Tradjenta 5 MG Tabs tablet Generic drug: linagliptin Take 5 mg by mouth daily.        Contact information for after-discharge care     Destination     [facility] .   Service: Skilled Nursing Contact information: [address] [place]  [phone]                     Discharge Exam: Cynthia Lucas   03/31/24 0615 04/04/24 0500  Weight: 79.3 kg 86.1 kg   General exam: Appears calm and comfortable  Respiratory system: Clear to auscultation. Respiratory effort normal. Cardiovascular system: Irregular. No murmurs Gastrointestinal system: Abdomen is soft, benign.   Central nervous system: Alert and oriented. No focal neurological deficits. Extremities: Symmetric 5 x 5 power. Skin: No rashes, lesions or ulcers Psychiatry: Judgement and insight appear normal. Mood & affect appropriate.   Condition at discharge: fair  The results of significant diagnostics from this hospitalization (including imaging, microbiology, ancillary and laboratory) are listed below for reference.   Imaging Studies: ECHOCARDIOGRAM COMPLETE Result Date: 03/31/2024    ECHOCARDIOGRAM REPORT   Patient Name:   Cynthia Lucas Date of Exam: 03/31/2024 Medical Rec #:  [id number]     Height:       67.0 in Accession #:    [id number]    Weight:       174.8 lb Date of Birth:  Jul 13, 1951     BSA:          1.910 m Patient Age:    73 years  BP:           128/53 mmHg Patient Gender: F             HR:           113 bpm. Exam Location:  [facility] Procedure: 2D Echo, Color Doppler and Cardiac Doppler (Both Spectral and Color            Flow Doppler were utilized during procedure). Indications:     Elevated Troponin  History:         Patient has prior history of Echocardiogram examinations.                  Arrythmias:Atrial Fibrillation; Risk Factors:Diabetes and                  Hypertension.  Sonographer:     L. Thornton-Maynard Referring Phys:  [id number] CAMILA A THOMAS Diagnosing Phys: Redell Cave MD  Sonographer Comments: Suboptimal apical window. IMPRESSIONS  1. Left ventricular ejection fraction, by estimation, is 55 to 60%. The left ventricle has normal function. The left ventricle has no regional wall motion abnormalities. There is mild left ventricular hypertrophy. Left ventricular diastolic  parameters are indeterminate.  2. Right ventricular systolic function is normal. The right ventricular size is normal. There is normal pulmonary artery systolic pressure.  3. The mitral valve is normal in structure. Mild mitral valve regurgitation.  4. The aortic valve is tricuspid. Aortic valve regurgitation is mild.  5. Aortic dilatation noted. There is mild dilatation of the ascending aorta, measuring 44 mm.  6. The inferior vena cava is normal in size with greater than 50% respiratory variability, suggesting right atrial pressure of 3 mmHg. FINDINGS  Left Ventricle: Left ventricular ejection fraction, by estimation, is 55 to 60%. The left ventricle has normal function. The left ventricle has no regional wall motion abnormalities. The left ventricular internal cavity size was normal in size. There is  mild left ventricular hypertrophy. Left ventricular diastolic parameters are indeterminate. Right Ventricle: The right ventricular size is normal. No increase in right ventricular wall thickness. Right ventricular systolic function is normal. There is normal pulmonary artery systolic pressure. The tricuspid regurgitant velocity is 2.68 m/s, and  with an assumed right atrial pressure of 3 mmHg, the estimated right ventricular systolic pressure is 31.7 mmHg. Left Atrium: Left atrial size was normal in size. Right Atrium: Right atrial size was normal in size. Pericardium: There is no evidence of pericardial effusion. Mitral Valve: The mitral valve is normal in structure. Mild mitral valve regurgitation. MV peak gradient, 3.9 mmHg. The mean mitral valve gradient is 2.0 mmHg. Tricuspid Valve: The tricuspid valve is normal in structure. Tricuspid valve regurgitation is mild. Aortic Valve: The aortic valve is tricuspid. Aortic valve regurgitation is mild. Aortic regurgitation PHT measures 309 msec. Aortic valve mean gradient measures 5.3 mmHg. Aortic valve peak gradient measures 10.1 mmHg. Aortic valve area, by VTI  measures 2.07 cm. Pulmonic Valve: The pulmonic valve was normal in structure. Pulmonic valve regurgitation is mild. Aorta: Aortic dilatation noted. There is mild dilatation of the ascending aorta, measuring 44 mm. Venous: The inferior vena cava is normal in size with greater than 50% respiratory variability, suggesting right atrial pressure of 3 mmHg. IAS/Shunts: No atrial level shunt detected by color flow Doppler.  LEFT VENTRICLE PLAX 2D LVIDd:         3.80 cm     Diastology LVIDs:         3.30 cm  LV e' medial:    10.50 cm/s LV PW:         1.30 cm     LV E/e' medial:  8.0 LV IVS:        1.40 cm     LV e' lateral:   10.50 cm/s LVOT diam:     1.90 cm     LV E/e' lateral: 8.0 LV SV:         43 LV SV Index:   23 LVOT Area:     2.84 cm  LV Volumes (MOD) LV vol d, MOD A2C: 60.2 ml LV vol d, MOD A4C: 59.6 ml LV vol s, MOD A2C: 25.6 ml LV vol s, MOD A4C: 15.6 ml LV SV MOD A2C:     34.6 ml LV SV MOD A4C:     59.6 ml LV SV MOD BP:      41.0 ml RIGHT VENTRICLE             IVC RV Basal diam:  3.00 cm     IVC diam: 1.00 cm RV S prime:     11.85 cm/s TAPSE (M-mode): 1.4 cm LEFT ATRIUM             Index        RIGHT ATRIUM           Index LA diam:        3.70 cm 1.94 cm/m   RA Area:     14.50 cm LA Vol (A2C):   32.8 ml 17.17 ml/m  RA Volume:   34.00 ml  17.80 ml/m LA Vol (A4C):   52.3 ml 27.39 ml/m LA Biplane Vol: 42.5 ml 22.25 ml/m  AORTIC VALVE                     PULMONIC VALVE AV Area (Vmax):    2.04 cm      PV Vmax:          1.35 m/s AV Area (Vmean):   2.07 cm      PV Peak grad:     7.3 mmHg AV Area (VTI):     2.07 cm      PR End Diast Vel: 8.47 msec AV Vmax:           159.00 cm/s AV Vmean:          107.167 cm/s AV VTI:            0.210 m AV Peak Grad:      10.1 mmHg AV Mean Grad:      5.3 mmHg LVOT Vmax:         114.67 cm/s LVOT Vmean:        78.100 cm/s LVOT VTI:          0.153 m LVOT/AV VTI ratio: 0.73 AI PHT:            309 msec  AORTA Ao Root diam: 3.10 cm Ao Asc diam:  4.40 cm MITRAL VALVE                TRICUSPID VALVE MV Area (PHT): 8.29 cm    TR Peak grad:   28.7 mmHg MV Area VTI:   4.59 cm    TR Vmax:        268.00 cm/s MV Peak grad:  3.9 mmHg MV Mean grad:  2.0 mmHg    SHUNTS MV Vmax:       0.99 m/s    Systemic VTI:  0.15  m MV Vmean:      63.0 cm/s   Systemic Diam: 1.90 cm MV Decel Time: 92 msec MV E velocity: 83.85 cm/s Redell Cave MD Electronically signed by Redell Cave MD Signature Date/Time: 03/31/2024/1:08:57 PM    Final    DG OR UROLOGY CYSTO IMAGE ([facility] ONLY) Result Date: 03/31/2024 There is no interpretation for this exam.  This order is for images obtained during a surgical procedure.  Please See Surgeries Tab for more information regarding the procedure.   CT ABDOMEN PELVIS WO CONTRAST Result Date: 03/31/2024 CLINICAL DATA:  73 year old female with weakness, decreased p.o., abdominal pain. EXAM: CT ABDOMEN AND PELVIS WITHOUT CONTRAST TECHNIQUE: Multidetector CT imaging of the abdomen and pelvis was performed following the standard protocol without IV contrast. RADIATION DOSE REDUCTION: This exam was performed according to the departmental dose-optimization program which includes automated exposure control, adjustment of the mA and/or kV according to patient size and/or use of iterative reconstruction technique. COMPARISON:  Noncontrast CT Abdomen and Pelvis 01/21/2021. FINDINGS: Lower chest: Calcified coronary artery atherosclerosis. Heart size remains normal. No pericardial effusion. Chronic tortuosity of the descending thoracic aorta. Lower lung volumes. Fairly symmetric lung base atelectasis. Chronic elevation of the right hemidiaphragm. No pleural effusion. Hepatobiliary: Mild gallbladder fundus calcification. Otherwise negative noncontrast liver and gallbladder. Pancreas: Negative. Spleen: Negative. Adrenals/Urinary Tract: Normal adrenal glands. Negative noncontrast left kidney and left ureter. Right double-J ureteral stent was in place in 2022, now removed. There is moderate  to severe right hydronephrosis and right hydroureter with pararenal and periureteral inflammatory stranding. The right ureter remains dilated to the pelvic inlet where obstructing 2 adjacent ureteral calculi are identified on coronal image 54. The larger is 4 mm. Distal to that the ureter is decompressed in the pelvis and to the bladder. Diminutive bladder. Punctate additional right lower pole nephrolithiasis. Stomach/Bowel: Fluid in nondilated sigmoid colon and the rectum. Mildly featureless appearance of the sigmoid. Decompressed upstream descending colon with diverticulosis but no active inflammation. Similar decompressed transverse colon with mild diverticulosis. Redundant hepatic flexure. Decompressed right colon. Retrocecal appendix appears to remain normal, contains gas and tracks to the inferior liver contour on coronal image 48. Decompressed terminal ileum. No dilated small bowel. Trace oral contrast or other dense enteric material in the stomach along with retained fluid. Decompressed duodenum. No pneumoperitoneum. No free fluid identified. Vascular/Lymphatic: Aortoiliac calcified atherosclerosis. Chronic tortuosity of the aorta. Negative for abdominal aortic aneurysm. Vascular patency is not evaluated in the absence of IV contrast. No lymphadenopathy identified. Reproductive: Chronically absent. Other: No pelvis free fluid. Musculoskeletal: Chronic degenerative appearing lower lumbar and lumbosacral junction facet ankylosis. Advanced facet arthropathy at the adjacent segment. Chronic lower thoracic flowing endplate osteophytes with interbody ankylosis. No acute osseous abnormality identified. IMPRESSION: 1. Moderate to Severe acute obstructive uropathy on the right with Hydronephrosis and Hydroureter due to two adjacent ureteral calculiat the pelvic inlet, the larger 4 mm. Punctate additional right nephrolithiasis. 2. Fluid in the nondilated sigmoid colon and rectum raising the possibility of diarrhea. No  bowel obstruction. Normal retrocecal appendix. 3.  Aortic Atherosclerosis (ICD10-I70.0). Electronically Signed   By: VEAR Hurst M.D.   On: 03/31/2024 08:00   DG Chest Port 1 View Result Date: 03/31/2024 CLINICAL DATA:  73 year old female with weakness, decreased p.o. EXAM: PORTABLE CHEST 1 VIEW COMPARISON:  Portable chest 12/27/2020. FINDINGS: Portable AP upright view at 0637 hours. Improved lung volumes. Mild elevation of the right hemidiaphragm is less pronounced, probably normal variant. Normal cardiac size and mediastinal contours. Visualized  tracheal air column is within normal limits. Allowing for portable technique the lungs are clear. No pneumothorax or pleural effusion. Paucity of bowel gas in the upper abdomen. No acute osseous abnormality identified. IMPRESSION: No acute cardiopulmonary abnormality. Electronically Signed   By: VEAR Hurst M.D.   On: 03/31/2024 06:46    Microbiology: Results for orders placed or performed during the hospital encounter of 03/31/24  Culture, blood (routine x 2)     Status: Abnormal   Collection Time: 03/31/24  6:26 AM   Specimen: BLOOD  Result Value Ref Range Status   Specimen Description   Final    BLOOD RIGHT ARM Performed at [facility], [address] [place], [place] [address]    Special Requests   Final    BOTTLES DRAWN AEROBIC AND ANAEROBIC Blood Culture results may not be optimal due to an inadequate volume of blood received in culture bottles Performed at [facility], [address], [place], [place] [address]    Culture  Setup Time   Final    GRAM NEGATIVE RODS IN BOTH AEROBIC AND ANAEROBIC BOTTLES CRITICAL VALUE NOTED.  VALUE IS CONSISTENT WITH PREVIOUSLY REPORTED AND CALLED VALUE. Performed at [facility], [address] [place], [place] [address]    Culture (A)  Final    ESCHERICHIA COLI SUSCEPTIBILITIES PERFORMED ON PREVIOUS CULTURE WITHIN THE LAST 5 DAYS. Performed at [facility], [address], [place], [place] [address]    Report Status 04/03/2024 FINAL  Final  Resp panel by RT-PCR (RSV, Flu A&B, Covid) Urine, Catheterized     Status: None   Collection Time: 03/31/24  6:26 AM   Specimen: Urine, Catheterized; Nasal Swab  Result Value Ref Range Status   SARS Coronavirus 2 by RT PCR NEGATIVE NEGATIVE Final    Comment: (NOTE) SARS-CoV-2 target nucleic acids are NOT DETECTED.  The SARS-CoV-2 RNA is generally detectable in upper respiratory specimens during the acute phase of infection. The lowest concentration of SARS-CoV-2 viral copies this assay can detect is 138 copies/mL. A negative result does not preclude SARS-Cov-2 infection and should not be used as the sole basis for treatment or other patient management decisions. A negative result may occur with  improper specimen collection/handling, submission of specimen other than nasopharyngeal swab, presence of viral mutation(s) within the areas targeted by this assay, and inadequate number of viral copies(<138 copies/mL). A negative result must be combined with clinical observations, patient history, and epidemiological information. The expected result is Negative.  Fact Sheet for Patients:  BloggerCourse.com  Fact Sheet for Healthcare Providers:  SeriousBroker.it  This test is no t yet approved or cleared by the [place]  FDA and  has been authorized for detection and/or diagnosis of SARS-CoV-2 by FDA under an Emergency Use Authorization (EUA). This EUA will remain  in effect (meaning this test can be used) for the duration of the COVID-19 declaration under Section 564(b)(1) of the Act, 21 U.S.C.section 360bbb-3(b)(1), unless the authorization is terminated  or revoked sooner.       Influenza A by PCR NEGATIVE NEGATIVE Final   Influenza B by PCR NEGATIVE NEGATIVE Final    Comment: (NOTE) The Xpert Xpress SARS-CoV-2/FLU/RSV plus assay is intended as an aid in the  diagnosis of influenza from Nasopharyngeal swab specimens and should not be used as a sole basis for treatment. Nasal washings and aspirates are unacceptable for Xpert Xpress SARS-CoV-2/FLU/RSV testing.  Fact Sheet for Patients: BloggerCourse.com  Fact Sheet for Healthcare Providers:  SeriousBroker.it  This test is not yet approved or cleared by the [place]  FDA and has been authorized for detection and/or diagnosis of SARS-CoV-2 by FDA under an Emergency Use Authorization (EUA). This EUA will remain in effect (meaning this test can be used) for the duration of the COVID-19 declaration under Section 564(b)(1) of the Act, 21 U.S.C. section 360bbb-3(b)(1), unless the authorization is terminated or revoked.     Resp Syncytial Virus by PCR NEGATIVE NEGATIVE Final    Comment: (NOTE) Fact Sheet for Patients: BloggerCourse.com  Fact Sheet for Healthcare Providers: SeriousBroker.it  This test is not yet approved or cleared by the [place]  FDA and has been authorized for detection and/or diagnosis of SARS-CoV-2 by FDA under an Emergency Use Authorization (EUA). This EUA will remain in effect (meaning this test can be used) for the duration of the COVID-19 declaration under Section 564(b)(1) of the Act, 21 U.S.C. section 360bbb-3(b)(1), unless the authorization is terminated or revoked.  Performed at [facility], [address] [place], [place] [address]   Urine Culture     Status: Abnormal   Collection Time: 03/31/24  6:26 AM   Specimen: Urine, Random  Result Value Ref Range Status   Specimen Description   Final    URINE, RANDOM Performed at [facility], [address] [place], [place] [address]    Special Requests   Final    NONE Reflexed from [id number] Performed at [facility], [address] [place], [place] [address]    Culture  >=100,000 COLONIES/mL ESCHERICHIA COLI (A)  Final   Report Status 04/02/2024 FINAL  Final   Organism ID, Bacteria ESCHERICHIA COLI (A)  Final      Susceptibility   Escherichia coli - MIC*    AMPICILLIN <=2 SENSITIVE Sensitive     CEFAZOLIN  <=4 SENSITIVE Sensitive     CEFEPIME  <=0.12 SENSITIVE Sensitive     CEFTRIAXONE  <=0.25 SENSITIVE Sensitive     CIPROFLOXACIN  <=0.25 SENSITIVE Sensitive     GENTAMICIN 2 SENSITIVE Sensitive     IMIPENEM 1 SENSITIVE Sensitive     NITROFURANTOIN  <=16 SENSITIVE Sensitive     TRIMETH/SULFA <=20 SENSITIVE Sensitive     AMPICILLIN/SULBACTAM <=2 SENSITIVE Sensitive     PIP/TAZO <=4 SENSITIVE Sensitive ug/mL    * >=100,000 COLONIES/mL ESCHERICHIA COLI  Culture, blood (routine x 2)     Status: Abnormal   Collection Time: 03/31/24  6:29 AM   Specimen: BLOOD  Result Value Ref Range Status   Specimen Description   Final    BLOOD LEFT ARM Performed at [facility], [address] [place], [place] [address]    Special Requests   Final    BOTTLES DRAWN AEROBIC AND ANAEROBIC Blood Culture results may not be optimal due to an inadequate volume of blood received in culture bottles Performed at [facility], [address] [place], [place] [address]    Culture  Setup Time   Final    GRAM NEGATIVE RODS IN BOTH AEROBIC AND ANAEROBIC BOTTLES CRITICAL RESULT CALLED TO, READ BACK BY AND VERIFIED WITH: NATHAN BELUE, PHARMD @0130  04/01/2024 COP Performed at [facility], [address] [place], [place] [address]    Culture ESCHERICHIA COLI (A)  Final   Report Status 04/03/2024 FINAL  Final   Organism ID, Bacteria ESCHERICHIA COLI  Final   Organism ID, Bacteria ESCHERICHIA COLI  Final      Susceptibility   Escherichia coli - KIRBY BAUER*  CEFAZOLIN  SENSITIVE Sensitive    Escherichia coli - MIC*    AMPICILLIN <=2 SENSITIVE Sensitive     CEFEPIME  <=0.12 SENSITIVE Sensitive     CEFTAZIDIME <=1 SENSITIVE Sensitive     CEFTRIAXONE  <=0.25  SENSITIVE Sensitive     CIPROFLOXACIN  <=0.25 SENSITIVE Sensitive     GENTAMICIN 2 SENSITIVE Sensitive     IMIPENEM 1 SENSITIVE Sensitive     TRIMETH/SULFA <=20 SENSITIVE Sensitive     AMPICILLIN/SULBACTAM <=2 SENSITIVE Sensitive     PIP/TAZO <=4 SENSITIVE Sensitive ug/mL    * ESCHERICHIA COLI    ESCHERICHIA COLI  Blood Culture ID Panel (Reflexed)     Status: Abnormal   Collection Time: 03/31/24  6:29 AM  Result Value Ref Range Status   Enterococcus faecalis NOT DETECTED NOT DETECTED Final   Enterococcus Faecium NOT DETECTED NOT DETECTED Final   Listeria monocytogenes NOT DETECTED NOT DETECTED Final   Staphylococcus species NOT DETECTED NOT DETECTED Final   Staphylococcus aureus (BCID) NOT DETECTED NOT DETECTED Final   Staphylococcus epidermidis NOT DETECTED NOT DETECTED Final   Staphylococcus lugdunensis NOT DETECTED NOT DETECTED Final   Streptococcus species NOT DETECTED NOT DETECTED Final   Streptococcus agalactiae NOT DETECTED NOT DETECTED Final   Streptococcus pneumoniae NOT DETECTED NOT DETECTED Final   Streptococcus pyogenes NOT DETECTED NOT DETECTED Final   A.calcoaceticus-baumannii NOT DETECTED NOT DETECTED Final   Bacteroides fragilis NOT DETECTED NOT DETECTED Final   Enterobacterales DETECTED (A) NOT DETECTED Final    Comment: Enterobacterales represent a large order of gram negative bacteria, not a single organism. CRITICAL RESULT CALLED TO, READ BACK BY AND VERIFIED WITH: NATHAN BELUE, PHARMD @0130  04/01/2024 COP    Enterobacter cloacae complex NOT DETECTED NOT DETECTED Final   Escherichia coli DETECTED (A) NOT DETECTED Final    Comment: CRITICAL RESULT CALLED TO, READ BACK BY AND VERIFIED WITH: NATHAN BELUE, PHARMD @0130  04/01/2024 COP    Klebsiella aerogenes NOT DETECTED NOT DETECTED Final   Klebsiella oxytoca NOT DETECTED NOT DETECTED Final   Klebsiella pneumoniae NOT DETECTED NOT DETECTED Final   Proteus species NOT DETECTED NOT DETECTED Final   Salmonella  species NOT DETECTED NOT DETECTED Final   Serratia marcescens NOT DETECTED NOT DETECTED Final   Haemophilus influenzae NOT DETECTED NOT DETECTED Final   Neisseria meningitidis NOT DETECTED NOT DETECTED Final   Pseudomonas aeruginosa NOT DETECTED NOT DETECTED Final   Stenotrophomonas maltophilia NOT DETECTED NOT DETECTED Final   Candida albicans NOT DETECTED NOT DETECTED Final   Candida auris NOT DETECTED NOT DETECTED Final   Candida glabrata NOT DETECTED NOT DETECTED Final   Candida krusei NOT DETECTED NOT DETECTED Final   Candida parapsilosis NOT DETECTED NOT DETECTED Final   Candida tropicalis NOT DETECTED NOT DETECTED Final   Cryptococcus neoformans/gattii NOT DETECTED NOT DETECTED Final   CTX-M ESBL NOT DETECTED NOT DETECTED Final   Carbapenem resistance IMP NOT DETECTED NOT DETECTED Final   Carbapenem resistance KPC NOT DETECTED NOT DETECTED Final   Carbapenem resistance NDM NOT DETECTED NOT DETECTED Final   Carbapenem resist OXA 48 LIKE NOT DETECTED NOT DETECTED Final   Carbapenem resistance VIM NOT DETECTED NOT DETECTED Final    Comment: Performed at [facility], [address] [place], [place] [address]    Labs: CBC: Recent Labs  Lab 03/31/24 0629 04/01/24 0446 04/02/24 0410 04/04/24 0528  WBC 17.4* 14.7* 13.4* 8.4  NEUTROABS 15.8*  --   --   --   HGB 13.0 10.9* 11.3* 10.6*  HCT 39.7 33.5* 34.5* 32.4*  MCV 80.2 80.1 81.4 80.2  PLT 161 142* 168 254   Basic Metabolic Panel: Recent Labs  Lab 03/31/24 0629 03/31/24 1147 04/01/24 0446 04/02/24 0410 04/03/24 0347 04/04/24 0528  NA 135 138 135 137 138 141  K 2.9* 3.2* 4.4 5.0 3.7 4.2  CL 100 106 106 105 107 104  CO2 22 21* 21* 25 24 26   GLUCOSE 359* 209* 261* 212* 140* 158*  BUN 48* 38* 37* 44* 35* 25*  CREATININE 2.64* 1.98* 1.68* 1.63* 1.47* 1.20*  CALCIUM  8.3* 7.8* 8.0* 8.3* 8.1* 8.5*  MG 2.0 1.9  --  2.9*  --   --    Liver Function Tests: Recent Labs  Lab 03/31/24 0629 04/01/24 0446  AST  60* 22  ALT 31 24  ALKPHOS 72 72  BILITOT 1.0 0.5  PROT 6.7 5.8*  ALBUMIN 3.1* 2.5*   CBG: Recent Labs  Lab 04/05/24 0753 04/05/24 1159 04/05/24 1614 04/05/24 2126 04/06/24 0727  GLUCAP 154* 169* 132* 216* 140*    Discharge time spent: greater than 30 minutes.  Signed: Cresencio Fairly, MD [facility] 04/06/2024

## 2024-04-06 NOTE — Plan of Care (Signed)
  Problem: Education: Goal: Ability to describe self-care measures that may prevent or decrease complications (Diabetes Survival Skills Education) will improve Outcome: Progressing   Problem: Skin Integrity: Goal: Risk for impaired skin integrity will decrease Outcome: Progressing   Problem: Pain Managment: Goal: General experience of comfort will improve and/or be controlled Outcome: Progressing   Problem: Safety: Goal: Ability to remain free from injury will improve Outcome: Progressing

## 2024-04-06 NOTE — TOC Progression Note (Signed)
 Transition of Care [facility]) - Progression Note    Patient Details  Name: Cynthia Lucas MRN: [id number] Date of Birth: Apr 18, 1951  [facility] CM/SW Contact  Lorraine LILLETTE Fenton, LCSW Phone Number: 04/06/2024, 10:06 AM  Clinical Narrative:    CSW contacted Massie at [facility] regarding potential DC.  [place] agreed they can accept pt today at 1PM.   to MD.  Once pt ready for DC, CSW added to transport list for a arrival to SNF at , after 1 PM.  Med necessity printed to floor.  No other TOC needs.    Expected Discharge Plan: Skilled Nursing Facility Barriers to Discharge: No Barriers Identified  Expected Discharge Plan and Services     Post Acute Care Choice: Skilled Nursing Facility Living arrangements for the past 2 months: Single Family Home Expected Discharge Date: 04/06/24                                     Social Determinants of Health (SDOH) Interventions SDOH Screenings   Food Insecurity: Patient Declined (03/31/2024)  Housing: Patient Declined (03/31/2024)  Recent Concern: Housing - High Risk (02/14/2024)   Received from [facility]  Transportation Needs: Patient Declined (03/31/2024)  Utilities: Patient Declined (03/31/2024)  Recent Concern: Utilities - At Risk (02/14/2024)   Received from [facility]  Financial Resource Strain: Low Risk  (02/14/2024)   Received from [facility]  Social Connections: Patient Declined (03/31/2024)  Tobacco Use: Low Risk  (03/31/2024)    Readmission Risk Interventions     No data to display

## 2024-04-24 NOTE — Progress Notes (Deleted)
 Cardiology Office Note    Date:  04/24/2024   ID:  Cynthia Lucas, DOB 1951/08/11, MRN [id number]  PCP:  Sampson Ethridge LABOR, MD  Cardiologist:  Deatrice Cage, MD  Electrophysiologist:  None   Chief Complaint: ***  History of Present Illness:   Cynthia Lucas is a 73 y.o. female with history of DM2, PAF, type 2 diabetes, hypertension, hyperlipidemia, tremor, sleep apnea not on CPAP secondary to intolerance, essential tremor, and GERD who presents for hospital follow-up.    Patient was admitted 12/2020 for left flank pain and coffee-ground emesis.  She was found to be in atrial fibrillation with RVR on presentation which was a new diagnosis.  Echo showed EF 55 to 60%, mild to moderate MR.  She converted to sinus rhythm during her hospital admission with diltiazem .  She was started on Eliquis  prior to hospital discharge.  Troponin was mildly elevated during hospitalization and she was subsequently scheduled for outpatient Lexiscan  MPI 03/2021 which showed no evidence of ischemia or infarct with minimal coronary artery calcification on CT attenuated corrected images.   Patient presented to [facility] ED 03/31/2024 via EMS due to failure to thrive at home after the death of her son.  She had not been compliant with her medications due to depression.  In the ED, she endorsed abdominal pain prompting workup with abdominal CT which revealed a right renal stone.  UA was consistent with UTI.  Lab work revealed leukocytosis and lactate of 4.8.  She was also found to be in atrial fibrillation with RVR, heart rate 176 bpm.  She was given IV Cardizem  push and subsequently started on amiodarone  due to low blood pressure.  Metoprolol  was uptitrated and amiodarone  was discontinued.  Patient converted to sinus rhythm prior to discharge.  She was restarted on Eliquis  5 mg twice daily.  ***  Labs independently reviewed: 04/04/2024-NA 141, K4.2, BUN 25, creatinine 1.2, Hgb 10.6, HCT 33.4  Objective   Past Medical  History:  Diagnosis Date   Atrial fibrillation (HCC)    Cough    CHRONIC   Diabetes mellitus without complication (HCC)    GERD (gastroesophageal reflux disease)    History of kidney stones    Hypertension    Sleep apnea    no cpap   Tremor    HEAD    Current Medications: No outpatient medications have been marked as taking for the 04/25/24 encounter (Appointment) with Abigail Bernardino HERO, PA-C.    Allergies:   Nickel and Other   Social History   Socioeconomic History   Marital status: Divorced    Spouse name: Not on file   Number of children: Not on file   Years of education: Not on file   Highest education level: Not on file  Occupational History   Not on file  Tobacco Use   Smoking status: Never   Smokeless tobacco: Never  Vaping Use   Vaping status: Never Used  Substance and Sexual Activity   Alcohol use: Yes    Comment: OCCAS   Drug use: Never   Sexual activity: Yes    Birth control/protection: None  Other Topics Concern   Not on file  Social History Narrative   Not on file   Social Drivers of Health   Financial Resource Strain: Low Risk  (02/14/2024)   Received from [facility]   Overall Financial Resource Strain (CARDIA)    Difficulty of Paying Living Expenses: Not very hard  Food Insecurity: Patient Declined (03/31/2024)  Hunger Vital Sign    Worried About Running Out of Food in the Last Year: Patient declined    Ran Out of Food in the Last Year: Patient declined  Transportation Needs: Patient Declined (03/31/2024)   PRAPARE - Administrator, Civil Service (Medical): Patient declined    Lack of Transportation (Non-Medical): Patient declined  Physical Activity: Not on file  Stress: Not on file  Social Connections: Patient Declined (03/31/2024)   Social Connection and Isolation Panel    Frequency of Communication with Friends and Family: Patient declined    Frequency of Social Gatherings with Friends and Family: Patient  declined    Attends Religious Services: Patient declined    Database administrator or Organizations: Patient declined    Attends Banker Meetings: Patient declined    Marital Status: Patient declined     Family History:  The patient's family history includes Heart disease in her mother. There is no history of Breast cancer.  ROS:   12-point review of systems is negative unless otherwise noted in the HPI.  EKGs/Other Studies Reviewed:    Studies reviewed were summarized above. The additional studies were reviewed today:  03/31/2024 Echo complete 1. Left ventricular ejection fraction, by estimation, is 55 to 60%. The  left ventricle has normal function. The left ventricle has no regional  wall motion abnormalities. There is mild left ventricular hypertrophy.  Left ventricular diastolic parameters  are indeterminate.   2. Right ventricular systolic function is normal. The right ventricular  size is normal. There is normal pulmonary artery systolic pressure.   3. The mitral valve is normal in structure. Mild mitral valve  regurgitation.   4. The aortic valve is tricuspid. Aortic valve regurgitation is mild.   5. Aortic dilatation noted. There is mild dilatation of the ascending  aorta, measuring 44 mm.   6. The inferior vena cava is normal in size with greater than 50%  respiratory variability, suggesting right atrial pressure of 3 mmHg.   EKG:  EKG personally reviewed by me today    PHYSICAL EXAM:    VS:  There were no vitals taken for this visit.  BMI: There is no height or weight on file to calculate BMI.  Physical Exam  Wt Readings from Last 3 Encounters:  04/04/24 189 lb 13.1 oz (86.1 kg)  11/01/22 213 lb 4 oz (96.7 kg)  04/26/22 202 lb (91.6 kg)        ASSESSMENT & PLAN:   Paroxysmal atrial fibrillation   Hypertension   Coronary artery calcification   Hyperlipidemia    {Are you ordering a CV Procedure (e.g. stress test, cath, DCCV, TEE,  etc)?   Press F2        :789639268}   Disposition: F/u with Dr. Darron or an APP in ***.   Medication Adjustments/Labs and Tests Ordered: Current medicines are reviewed at length with the patient today.  Concerns regarding medicines are outlined above. Medication changes, Labs and Tests ordered today are summarized above and listed in the Patient Instructions accessible in Encounters.   Bonney Lesley Maffucci, PA-C 04/24/2024 3:48 PM     [facility] [place] [address] [place], [place] [address] [phone]

## 2024-04-25 ENCOUNTER — Other Ambulatory Visit: Payer: Self-pay | Admitting: Urology

## 2024-04-25 ENCOUNTER — Telehealth: Payer: Self-pay | Admitting: Cardiovascular Disease

## 2024-04-25 ENCOUNTER — Ambulatory Visit: Admitting: Physician Assistant

## 2024-04-25 NOTE — Telephone Encounter (Signed)
   Pre-operative Risk Assessment    Patient Name: Cynthia Lucas  DOB: [birth year] MRN: [id number]   Date of last office visit: 11/01/22 Date of next office visit: 05/02/24   Request for Surgical Clearance    Procedure:  Ureteroscopy, Right Retrograde Pyelogram and  Stent Exchange  Date of Surgery:  Clearance 05/24/24                                Surgeon:  Dr. Ricardo Likens Surgeon's Group or Practice Name:  [facility] Phone number:  [phone]  Fax number:  [phone]   Type of Clearance Requested:   - Medical  - Pharmacy:  Hold Apixaban  (Eliquis )     Type of Anesthesia:  General    Additional requests/questions:  Caller [facility]y) stated they are deferring to there cardiologist to how long patient needs to hold her Eliquis .    Signed, Herma KATHEE Blush   04/25/2024, 10:48 AM

## 2024-04-29 NOTE — Telephone Encounter (Signed)
 Patient with diagnosis of atrial fibrillation on Eliquis  for anticoagulation.    Procedure:  Ureteroscopy, Right Retrograde Pyelogram and  Stent Exchange   Date of Surgery:  Clearance 05/24/24     CHA2DS2-VASc Score = 4   This indicates a 4.8% annual risk of stroke. The patient's score is based upon: CHF History: 0 HTN History: 1 Diabetes History: 1 Stroke History: 0 Vascular Disease History: 0 Age Score: 1 Gender Score: 1  CrCl 57 Platelet count 254  Patient has not had an Afib/aflutter ablation within the last 3 months or DCCV within the last 30 days  Per office protocol, patient can hold Eliquis  for 2 days prior to procedure.   Patient will not need bridging with Lovenox  (enoxaparin ) around procedure.  **This guidance is not considered finalized until pre-operative APP has relayed final recommendations.**

## 2024-05-02 ENCOUNTER — Ambulatory Visit: Attending: Cardiology | Admitting: Cardiology

## 2024-05-02 NOTE — Progress Notes (Deleted)
 Cardiology Office Note    Date:  05/02/2024   ID:  Cortnie Ringel, DOB Nov 09, 1950, MRN [id number]  PCP:  Sampson Ethridge LABOR, MD  Cardiologist:  Deatrice Cage, MD  Electrophysiologist:  None   Chief Complaint: ***  History of Present Illness:   Cynthia Lucas is a 73 y.o. female with history of DM2, PAF, type 2 diabetes, hypertension, hyperlipidemia, tremor, sleep apnea not on CPAP secondary to intolerance, essential tremor, and GERD who presents for hospital follow-up.    Patient was admitted 12/2020 for left flank pain and coffee-ground emesis.  She was found to be in atrial fibrillation with RVR on presentation which was a new diagnosis.  Echo showed EF 55 to 60%, mild to moderate MR.  She converted to sinus rhythm during her hospital admission with diltiazem .  She was started on Eliquis  prior to hospital discharge.  Troponin was mildly elevated during hospitalization and she was subsequently scheduled for outpatient Lexiscan  MPI 03/2021 which showed no evidence of ischemia or infarct with minimal coronary artery calcification on CT attenuated corrected images.   Patient presented to [facility] ED 03/31/2024 via EMS due to failure to thrive at home after the death of her son.  She had not been compliant with her medications due to depression.  In the ED, she endorsed abdominal pain prompting workup with abdominal CT which revealed a right renal stone.  UA was consistent with UTI.  Lab work revealed leukocytosis and lactate of 4.8.  She was also found to be in atrial fibrillation with RVR, heart rate 176 bpm.  She was given IV Cardizem  push and subsequently started on amiodarone  due to low blood pressure.  Metoprolol  was uptitrated and amiodarone  was discontinued.  Patient converted to sinus rhythm prior to discharge.  She was restarted on Eliquis  5 mg twice daily.  ***  Labs independently reviewed: 04/04/2024-NA 141, K4.2, BUN 25, creatinine 1.2, Hgb 10.6, HCT 33.4  Objective   Past Medical  History:  Diagnosis Date   Atrial fibrillation (HCC)    Cough    CHRONIC   Diabetes mellitus without complication (HCC)    GERD (gastroesophageal reflux disease)    History of kidney stones    Hypertension    Sleep apnea    no cpap   Tremor    HEAD    Current Medications: No outpatient medications have been marked as taking for the 05/02/24 encounter (Appointment) with Gerard Frederick, NP.    Allergies:   Nickel and Other   Social History   Socioeconomic History   Marital status: Divorced    Spouse name: Not on file   Number of children: Not on file   Years of education: Not on file   Highest education level: Not on file  Occupational History   Not on file  Tobacco Use   Smoking status: Never   Smokeless tobacco: Never  Vaping Use   Vaping status: Never Used  Substance and Sexual Activity   Alcohol use: Yes    Comment: OCCAS   Drug use: Never   Sexual activity: Yes    Birth control/protection: None  Other Topics Concern   Not on file  Social History Narrative   Not on file   Social Drivers of Health   Financial Resource Strain: Low Risk  (02/14/2024)   Received from [facility]   Overall Financial Resource Strain (CARDIA)    Difficulty of Paying Living Expenses: Not very hard  Food Insecurity: Patient Declined (03/31/2024)  Hunger Vital Sign    Worried About Running Out of Food in the Last Year: Patient declined    Ran Out of Food in the Last Year: Patient declined  Transportation Needs: Patient Declined (03/31/2024)   PRAPARE - Administrator, Civil Service (Medical): Patient declined    Lack of Transportation (Non-Medical): Patient declined  Physical Activity: Not on file  Stress: Not on file  Social Connections: Patient Declined (03/31/2024)   Social Connection and Isolation Panel    Frequency of Communication with Friends and Family: Patient declined    Frequency of Social Gatherings with Friends and Family: Patient  declined    Attends Religious Services: Patient declined    Database administrator or Organizations: Patient declined    Attends Banker Meetings: Patient declined    Marital Status: Patient declined     Family History:  The patient's family history includes Heart disease in her mother. There is no history of Breast cancer.  ROS:   12-point review of systems is negative unless otherwise noted in the HPI.  EKGs/Other Studies Reviewed:    Studies reviewed were summarized above. The additional studies were reviewed today:  03/31/2024 Echo complete 1. Left ventricular ejection fraction, by estimation, is 55 to 60%. The  left ventricle has normal function. The left ventricle has no regional  wall motion abnormalities. There is mild left ventricular hypertrophy.  Left ventricular diastolic parameters  are indeterminate.   2. Right ventricular systolic function is normal. The right ventricular  size is normal. There is normal pulmonary artery systolic pressure.   3. The mitral valve is normal in structure. Mild mitral valve  regurgitation.   4. The aortic valve is tricuspid. Aortic valve regurgitation is mild.   5. Aortic dilatation noted. There is mild dilatation of the ascending  aorta, measuring 44 mm.   6. The inferior vena cava is normal in size with greater than 50%  respiratory variability, suggesting right atrial pressure of 3 mmHg.   EKG:  EKG personally reviewed by me today    PHYSICAL EXAM:    VS:  There were no vitals taken for this visit.  BMI: There is no height or weight on file to calculate BMI.  Physical Exam  Wt Readings from Last 3 Encounters:  04/04/24 189 lb 13.1 oz (86.1 kg)  11/01/22 213 lb 4 oz (96.7 kg)  04/26/22 202 lb (91.6 kg)        ASSESSMENT & PLAN:   Paroxysmal atrial fibrillation   Hypertension   Coronary artery calcification   Hyperlipidemia  Preprocedural cardiovascular examination {Click Here to Calculate RCRI       :789639253}  { Click Here to Calculate DASI      :789639253} {Select to add RCRI Risk (<1%=LOW; >/=1%=HIGH) (Optional):21036017}  {Select if HIGH (RCRI >/=1%) Risk (Optional):21036030} Recommendations: {2014 ACC/AHA Perioperative Guidelines  :21036001} Antiplatelet and/or Anticoagulation Recommendations: {Antiplatelet Recommendations                  :21036016} {Anticoagulation Recommendations           :78963980}    {Are you ordering a CV Procedure (e.g. stress test, cath, DCCV, TEE, etc)?   Press F2        :789639268}   Disposition: F/u with Dr. Darron or an APP in ***.   Medication Adjustments/Labs and Tests Ordered: Current medicines are reviewed at length with the patient today.  Concerns regarding medicines are outlined above. Medication changes, Labs and  Tests ordered today are summarized above and listed in the Patient Instructions accessible in Encounters.   SignedTylene Lunch, NP 05/02/2024 12:50 PM     [facility] [place] [address] [place], [place] [address] [phone]

## 2024-05-06 ENCOUNTER — Telehealth: Payer: Self-pay

## 2024-05-06 NOTE — Telephone Encounter (Addendum)
 Pt scheduled for preop clearance on 05/16/24.

## 2024-05-06 NOTE — Telephone Encounter (Signed)
 Office calling to f/u on clearance.

## 2024-05-07 ENCOUNTER — Ambulatory Visit: Admitting: Pulmonary Disease

## 2024-05-10 NOTE — Patient Instructions (Addendum)
 SURGICAL WAITING ROOM VISITATION Patients having surgery or a procedure may have no more than 2 support people in the waiting area - these visitors may rotate in the visitor waiting room.   Due to an increase in RSV and influenza rates and associated hospitalizations, children ages 58 and under may not visit patients in [facility] hospitals. If the patient needs to stay at the hospital during part of their recovery, the visitor guidelines for inpatient rooms apply.  PRE-OP VISITATION  Pre-op nurse will coordinate an appropriate time for 1 support person to accompany the patient in pre-op.  This support person may not rotate.  This visitor will be contacted when the time is appropriate for the visitor to come back in the pre-op area.  Please refer to the [facility] website for the visitor guidelines for Inpatients (after your surgery is over and you are in a regular room).  You are not required to quarantine at this time prior to your surgery. However, you must do this: Hand Hygiene often Do NOT share personal items Notify your provider if you are in close contact with someone who has COVID or you develop fever 100.4 or greater, new onset of sneezing, cough, sore throat, shortness of breath or body aches.  If you test positive for Covid or have been in contact with anyone that has tested positive in the last 10 days please notify you surgeon.    Your procedure is scheduled on:  05/24/24  Report to [facility] Main Entrance: [place] entrance where the [employer] is available.   Report to admitting at: 1:00 PM  Call this number if you have any questions or problems the morning of surgery [phone]  FOLLOW ANY ADDITIONAL PRE OP INSTRUCTIONS YOU RECEIVED FROM YOUR SURGEON'S OFFICE!!!  Do not eat food after Midnight the night prior to your surgery/procedure.  After Midnight you may have the following liquids until : 12:00 PM DAY OF SURGERY  Clear Liquid Diet Water  Black  Coffee (sugar ok, NO MILK/CREAM OR CREAMERS)  Tea (sugar ok, NO MILK/CREAM OR CREAMERS) regular and decaf                             Plain Jell-O  with no fruit (NO RED)                                           Fruit ices (not with fruit pulp, NO RED)                                     Popsicles (NO RED)                                                                  Juice: NO CITRUS JUICES: only apple, WHITE grape, WHITE cranberry Sports drinks like Gatorade or Powerade (NO RED)   Oral Hygiene is also important to reduce your risk of infection.        Remember - BRUSH YOUR TEETH THE MORNING OF SURGERY WITH YOUR REGULAR  TOOTHPASTE  Do NOT smoke after Midnight the night before surgery.  STOP TAKING all Vitamins, Herbs and supplements 1 week before your surgery.   Take ONLY these medicines the morning of surgery with A SIP OF WATER : metoprolol .Use eye drops as usual.  HOLD jardiance  after: 05/20/24. DO NOT take tradjenta the day of surgery. These are anesthesia recommendations for holding your anticoagulants.  Please contact your prescribing physician to confirm IF it is safe to hold your anticoagulants for this length of time.   Eliquis  Apixaban    72 hours   Xarelto Rivaroxaban   72 hours  Plavix Clopidogrel   120 hours  Pletal Cilostazol   120 hours  HOLD Eliquis  after: 05/20/24  If You have been diagnosed with Sleep Apnea - Bring CPAP mask and tubing day of surgery. We will provide you with a CPAP machine on the day of your surgery.                   You may not have any metal on your body including hair pins, jewelry, and body piercing  Do not wear make-up, lotions, powders, perfumes / cologne, or deodorant  Do not wear nail polish including gel and S&S, artificial / acrylic nails, or any other type of covering on natural nails including finger and toenails. If you have artificial nails, gel coating, etc., that needs to be removed by a nail salon, Please have this removed prior  to surgery. Not doing so may mean that your surgery could be cancelled or delayed if the Surgeon or anesthesia staff feels like they are unable to monitor you safely.   Do not shave 48 hours prior to surgery to avoid nicks in your skin which may contribute to postoperative infections.   Contacts, Hearing Aids, dentures or bridgework may not be worn into surgery. DENTURES WILL BE REMOVED PRIOR TO SURGERY PLEASE DO NOT APPLY Poly grip OR ADHESIVES!!!  You may bring a small overnight bag with you on the day of surgery, only pack items that are not valuable. [facility] IS NOT RESPONSIBLE   FOR VALUABLES THAT ARE LOST OR STOLEN.   Patients discharged on the day of surgery will not be allowed to drive home.  Someone NEEDS to stay with you for the first 24 hours after anesthesia.  Do not bring your home medications to the hospital. The Pharmacy will dispense medications listed on your medication list to you during your admission in the Hospital.  Special Instructions: Bring a copy of your healthcare power of attorney and living will documents the day of surgery, if you wish to have them scanned into your [facility] Records- EPIC  Please read over the following fact sheets you were given: IF YOU HAVE QUESTIONS ABOUT YOUR PRE-OP INSTRUCTIONS, PLEASE CALL [phone]   [facility] - Preparing for Surgery Before surgery, you can play an important role.  Because skin is not sterile, your skin needs to be as free of germs as possible.  You can reduce the number of germs on your skin by washing with CHG (chlorahexidine gluconate) soap before surgery.  CHG is an antiseptic cleaner which kills germs and bonds with the skin to continue killing germs even after washing. Please DO NOT use if you have an allergy to CHG or antibacterial soaps.  If your skin becomes reddened/irritated stop using the CHG and inform your nurse when you arrive at Short Stay. Do not shave (including legs and underarms) for at  least 48 hours  prior to the first CHG shower.  You may shave your face/neck.  Please follow these instructions carefully:  1.  Shower with CHG Soap the night before surgery and the  morning of surgery.  2.  If you choose to wash your hair, wash your hair first as usual with your normal  shampoo.  3.  After you shampoo, rinse your hair and body thoroughly to remove the shampoo.                             4.  Use CHG as you would any other liquid soap.  You can apply chg directly to the skin and wash.  Gently with a scrungie or clean washcloth.  5.  Apply the CHG Soap to your body ONLY FROM THE NECK DOWN.   Do not use on face/ open                           Wound or open sores. Avoid contact with eyes, ears mouth and genitals (private parts).                       Wash face,  Genitals (private parts) with your normal soap.             6.  Wash thoroughly, paying special attention to the area where your  surgery  will be performed.  7.  Thoroughly rinse your body with warm water  from the neck down.  8.  DO NOT shower/wash with your normal soap after using and rinsing off the CHG Soap.            9.  Pat yourself dry with a clean towel.            10.  Wear clean pajamas.            11.  Place clean sheets on your bed the night of your first shower and do not  sleep with pets.  ON THE DAY OF SURGERY : Do not apply any lotions/deodorants the morning of surgery.  Please wear clean clothes to the hospital/surgery center.     FAILURE TO FOLLOW THESE INSTRUCTIONS MAY RESULT IN THE CANCELLATION OF YOUR SURGERY  PATIENT SIGNATURE_________________________________  NURSE SIGNATURE__________________________________  ________________________________________________________________________

## 2024-05-14 ENCOUNTER — Other Ambulatory Visit: Payer: Self-pay

## 2024-05-14 ENCOUNTER — Encounter (HOSPITAL_COMMUNITY): Payer: Self-pay

## 2024-05-14 ENCOUNTER — Encounter (HOSPITAL_COMMUNITY)
Admission: RE | Admit: 2024-05-14 | Discharge: 2024-05-14 | Disposition: A | Discharge status: Home / Self Care | Source: Ambulatory Visit | Attending: Urology | Admitting: Urology

## 2024-05-14 VITALS — Ht 67.0 in | Wt 173.0 lb

## 2024-05-14 DIAGNOSIS — E1165 Type 2 diabetes mellitus with hyperglycemia: Secondary | ICD-10-CM

## 2024-05-14 DIAGNOSIS — I4891 Unspecified atrial fibrillation: Secondary | ICD-10-CM

## 2024-05-14 DIAGNOSIS — Z01818 Encounter for other preprocedural examination: Secondary | ICD-10-CM

## 2024-05-14 HISTORY — DX: Cardiac arrhythmia, unspecified: I49.9

## 2024-05-14 NOTE — Progress Notes (Signed)
 Interview was done over the phone,pt. Did not come for PST appointment,she got confused and went to the cardiologist office in [place](she has an appointment there on Thursday 05/16/24). Instructions were given and e-mail to the pt.

## 2024-05-14 NOTE — Progress Notes (Addendum)
 For Anesthesia: PCP - Entzminger, Ethridge LABOR, MD  Cardiologist - Darron Deatrice LABOR, MD : ARNETTA: 04/01/24. Appointment on: 05/16/24  Bowel Prep reminder:  Chest x-ray - 03/31/24 EKG -04/02/24  Stress Test -  ECHO - 03/31/24 Cardiac Cath -  Pacemaker/ICD device last checked: Pacemaker orders received: Device Rep notified:  Spinal Cord Stimulator:N/A  Sleep Study - Yes CPAP - NO  Fasting Blood Sugar - N/A Checks Blood Sugar _____ times a day Date and result of last Hgb A1c-6.3: 03/31/24  Last dose of GLP1 agonist- N/A GLP1 instructions:   Last dose of SGLT-2 inhibitors- Jardiance  SGLT-2 instructions: To hold after: 05/20/24  Blood Thinner Instructions:Eliquis : Will be hold after: 05/20/24 Aspirin Instructions: Last Dose:  Activity level: Can go up a flight of stairs and activities of daily living without stopping and without chest pain and/or shortness of breath   Able to exercise without chest pain and/or shortness of breath  Anesthesia review: Hx: HTN,DIA,Afib,OSA(NO CPAP)  Patient denies shortness of breath, fever, cough and chest pain at PAT appointment   Patient verbalized understanding of instructions that were reviewed over the telephone.

## 2024-05-14 NOTE — Progress Notes (Signed)
 Pt. Refused blood products.

## 2024-05-16 ENCOUNTER — Ambulatory Visit: Attending: Medical | Admitting: Medical

## 2024-05-16 NOTE — Progress Notes (Deleted)
  Cardiology Office Note   Date:  05/16/2024  ID:  Cynthia Lucas, DOB 12-18-1950, MRN [id number] PCP: Buren Rock HERO, MD  [facility] Providers Cardiologist:  Deatrice Cage, MD { Click to update primary MD,subspecialty MD or APP then REFRESH:1}    History of Present Illness Cynthia Lucas is a 73 y.o. female with a hx of minimal coronary artery calcification on CT imaging, PAF, DM2, HTN, HLD, tremor, sleep apnea not on CPAP secondary to intolerance, essential tremor, and GERD who presents for follow-up of PAF.   She was admitted in 12/2020 with left flank pain and coffee-ground emesis.  She was found to be in A. fib with RVR upon presentation, which was a new diagnosis.  In addition, she was noted to have ARF with a creatinine of 6.5 as well as a gradual drop in her hemoglobin to 11.5.  She was rate controlled with diltiazem .  High-sensitivity troponin was mildly elevated and felt to be related to supply demand ischemia.  Echo showed a normal LV systolic function with an EF of 55 to 60%, no regional wall motion abnormalities, mild LVH, normal RV systolic function and ventricular cavity size, normal PASP, mild to moderate mitral regurgitation, moderately dilated left atrium, and a mildly dilated ascending aorta.  She converted to sinus rhythm during her hospital admission.  EGD, to further evaluate her drop in hemoglobin, showed no significant abnormalities.  She was started on Eliquis  prior to hospital discharge.  Her acute renal failure was felt to be due to obstructive nephropathy with subsequent improvement following ureteral stent placement.  Subsequent Lexiscan  MPI in 03/2021 showed no evidence of ischemia or infarct with minimal coronary artery calcification on CT attenuated corrected images.  She was last seen in the office on 04/08/2021 and was without symptoms of angina or decompensation.   The patient was admitted June 2025 for Afib RVR ,sepsis 2/2 UTI, right ureteral stone s/p ureteral  stent. Patient was not compliant with Eliquis  prior to admission. Patient was started on IV amiodarone  for rate control with improvement.  Rates improved to 80 to 100 bpm and IV amiodarone  was discontinued.  Metoprolol  was increased.  Patient subsequently converted to normal sinus rhythm.  ROS: ***  Studies Reviewed      *** Risk Assessment/Calculations {Does this patient have ATRIAL FIBRILLATION?:[id number]} No BP recorded.  {Refresh Note OR Click here to enter BP  :1}***       Physical Exam VS:  There were no vitals taken for this visit.       Wt Readings from Last 3 Encounters:  05/14/24 173 lb (78.5 kg)  04/04/24 189 lb 13.1 oz (86.1 kg)  11/01/22 213 lb 4 oz (96.7 kg)    GEN: Well nourished, well developed in no acute distress NECK: No JVD; No carotid bruits CARDIAC: ***RRR, no murmurs, rubs, gallops RESPIRATORY:  Clear to auscultation without rales, wheezing or rhonchi  ABDOMEN: Soft, non-tender, non-distended EXTREMITIES:  No edema; No deformity   ASSESSMENT AND PLAN ***    {Are you ordering a CV Procedure (e.g. stress test, cath, DCCV, TEE, etc)?   Press F2        :[id number]}  Dispo: ***  Signed, Verle Brillhart VEAR Fishman, PA-C

## 2024-05-21 NOTE — Progress Notes (Unsigned)
 Cardiology Clinic Note   Date: 05/22/2024 ID: Davia Smyre, DOB [birth year], MRN [id number]  Primary Cardiologist:  Deatrice Cage, MD  Chief Complaint   Cynthia Lucas is a 73 y.o. female who presents to the clinic today for preoperative evaluation.   Patient Profile   Cynthia Lucas is followed by Dr. Cage for the history outlined below.      Past medical history significant for: PAF. Onset March 2022. Ascending thoracic aortic aneurysm. Echo 03/31/2024: EF 55 to 60%.  No RWMA.  Mild LVH.  Indeterminate diastolic parameters.  Normal RV size/function.  Normal PA pressure RVSP 31.7 mmHg.  Mild MR/AI.  Mild dilatation of ascending aorta 44 mm. Hypertension. Hyperlipidemia. T2DM. GI bleed. 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   In summary, patient was admitted in March 2022 with left flank pain and coffee-ground emesis.  She was found to be in new onset A-fib with RVR.  In addition she was noted to be in acute renal failure with a creatinine of 6.5 as well as gradual drop in her hemoglobin to she was rate controlled with diltiazem .  Troponin was mildly elevated and felt to be related to supply demand ischemia.  Echo showed normal LV function with a EF of 55 to 60%, no RWMA, mild LVH, normal RV size/function, normal PA pressure, mild to moderate MR, moderate LAE, and mildly dilated ascending aorta.  She converted to sinus rhythm during hospital admission.  EGD to further evaluate drop in hemoglobin showed no significant abnormalities.  She was started on Eliquis  for stroke prophylaxis.  Acute renal failure was felt to be due to obstructive nephropathy with subsequent improvement following ureteral stent placement.  Lexiscan  in June 2022 showed no evidence of ischemia or infarct with minimal coronary artery calcification on CT attenuated corrected images.  She was last seen in follow-up in July 2022 and was without symptoms of angina or decompensation.  Patient  presented to the ED on 03/31/2024 with abdominal pain.  Patient lost her son in January.  It was reported that patient was in a severe depression with poor appetite and sleeping a lot.  Sister reported patient was essentially bedbound secondary to grief only going from bed to restroom.  Patient endorsed abdominal pain with no associated symptoms.  Patient was found to be in A-fib with RVR HR 176 bpm.  Initial labs: WBC 17.4, hemoglobin 13, hematocrit 39.7, sodium 135, potassium 2.9, creatinine 2.64, BUN 48, AST 60, ALT 31, respiratory panel negative, UTI grossly positive.  Patient was admitted for sepsis.  Chest x-ray was negative for acute cardiopulmonary abnormalities.  CT abdomen pelvis demonstrated moderate to severe acute obstructive uropathy on the right with hydronephrosis and hydroureter due to 2 adjacent ureteral calculi at the pelvic inlet > 4 mm, punctate additional right nephrolithiasis.  Patient reported nonadherence with Eliquis  prior to admission.  She was started on IV amiodarone  for rate control with improvement.  Urology was consulted and patient underwent cystoscopy with ureteral stent insertion on the right.  Postsurgery cardiology was consulted.  Patient reported improvement in abdominal pain.  Telemetry showed A-fib with 130 bpm.  Toprol  was stopped and patient was started on Lopressor  25 mg every 6 hours.  Amiodarone  drip was continued and patient was started on heparin .  Rates improved to 80 to 100 bpm.  Amiodarone  infusion was discontinued and metoprolol  was increased to 37.5 mg every 6 hours.  Patient converted to sinus rhythm.  Metoprolol  was consolidated to 50 mg twice daily.  She was transition from heparin  to Eliquis .  Patient was discharged to SNF on 04/06/2024.     History of Present Illness    Today, patient is here alone. She reports she has done well since getting out of the hospital. Patient denies shortness of breath, dyspnea on exertion, lower extremity edema, orthopnea or  PND. No chest pain, pressure, or tightness. No palpitations. She has no cardiac awareness of arrhythmia. She spent 21 days in the SNF before being discharged home. She has a baseline tremor of upper body that is being worked up for possible Parkinsons. She lives independently. She is pending ureteroscopy with stent exchange on 8/22. She is active walking and performing light to moderate household activities.      ROS: All other systems reviewed and are otherwise negative except as noted in History of Present Illness.  EKGs/Labs Reviewed    EKG Interpretation Date/Time:  Wednesday May 22 2024 13:30:50 EDT Ventricular Rate:  62 PR Interval:  126 QRS Duration:  70 QT Interval:  426 QTC Calculation: 432 R Axis:   47  Text Interpretation: Normal sinus rhythm Normal ECG When compared with ECG of 31-Mar-2024 06:32, PREVIOUS ECG IS PRESENT Confirmed by Loistine Sober [phone]) on 05/22/2024 1:33:36 PM   04/01/2024: ALT 24; AST 22 04/04/2024: BUN 25; Creatinine, Ser 1.20; Potassium 4.2; Sodium 141   04/04/2024: Hemoglobin 10.6; WBC 8.4    Risk Assessment/Calculations     CHA2DS2-VASc Score = 4   This indicates a 4.8% annual risk of stroke. The patient's score is based upon: CHF History: 0 HTN History: 1 Diabetes History: 1 Stroke History: 0 Vascular Disease History: 0 Age Score: 1 Gender Score: 1             Physical Exam    VS:  BP 120/70 (BP Location: Left Arm, Patient Position: Sitting, Cuff Size: Normal)   Pulse 62   Ht 5' 7 (1.702 m)   Wt 178 lb 2 oz (80.8 kg)   SpO2 93%   BMI 27.90 kg/m  , BMI Body mass index is 27.9 kg/m.  GEN: Well nourished, well developed, in no acute distress. Left eye patch in place.  Neck: No JVD or carotid bruits. Cardiac:  RRR.  No murmur. No rubs or gallops.   Respiratory:  Respirations regular and unlabored. Clear to auscultation without rales, wheezing or rhonchi. GI: Soft, nontender, nondistended. Extremities: Radials/DP/PT 2+ and  equal bilaterally. No clubbing or cyanosis. No edema.  Skin: Warm and dry, no rash. Neuro: Strength intact. Tremor of upper body.   Assessment & Plan   PAF Onset March 2022 in the setting of acute renal failure secondary to obstructive nephropathy.  Recent hospital admission in late June 2025 and found to be in A-fib with RVR in the setting of sepsis secondary to obstructive uropathy.  A-fib treated with IV amiodarone  with subsequent conversion to normal sinus rhythm.  Denies spontaneous bleeding concerns.  Patient denies cardiac awareness of afib. Discussed outpatient monitoring to determine afib burden. Patient was taking both metoprolol  tartrate and metoprolol  succinate. Patient would like to defer any further testing at this time. EKG demonstrates NSR.  - Stop metoprolol  tartrate.  - Continue Toprol  and Eliquis .  Ascending thoracic aortic aneurysm Echo June 2025 demonstrated normal LV/RV function, normal PA pressure, mild dilatation of ascending aorta 44 mm (prior echo March 2022 ascending aorta measured 41 mm).  Patient denies chest pain, back pain, lightheadedness, dizziness, presyncope or syncope.  - Continue yearly surveillance.  Hypertension BP  today 120/70. No dizziness or headaches.  - Continue losartan and Toprol .   Preoperative cardiovascular risk assessment  Ureteroscopy, right retrograde pyelogram and stent exchange.  According to the RCRI, patient has a 0.4% risk of MACE. Patient reports activity equivalent to 4.0 METS (walks and performs light to moderate household activities).  -Based on ACC/AHA guidelines, Mollee Neer would be at acceptable risk for the planned procedure without further cardiovascular testing.  -Per Pharm D, patient has not had an Afib/aflutter ablation within the last 3 months or DCCV within the last 30 days. Patient may hold Eliquis  for 2 days prior to procedure.  Patient will not need bridging with Lovenox  around procedure.    Disposition: Return in 3  month or sooner as needed.          Signed, Barnie HERO. Shivaun Bilello, DNP, NP-C

## 2024-05-22 ENCOUNTER — Encounter: Payer: Self-pay | Admitting: Student

## 2024-05-22 ENCOUNTER — Ambulatory Visit: Attending: Student | Admitting: Student

## 2024-05-22 VITALS — BP 120/70 | HR 62 | Ht 67.0 in | Wt 178.1 lb

## 2024-05-22 DIAGNOSIS — Z01818 Encounter for other preprocedural examination: Secondary | ICD-10-CM | POA: Diagnosis not present

## 2024-05-22 DIAGNOSIS — I10 Essential (primary) hypertension: Secondary | ICD-10-CM | POA: Diagnosis not present

## 2024-05-22 DIAGNOSIS — I7121 Aneurysm of the ascending aorta, without rupture: Secondary | ICD-10-CM | POA: Diagnosis not present

## 2024-05-22 DIAGNOSIS — I480 Paroxysmal atrial fibrillation: Secondary | ICD-10-CM

## 2024-05-22 NOTE — Patient Instructions (Signed)
 Medication Instructions:  Your physician recommends the following medication changes.  STOP TAKING: Metoprolol  Tartrate    *If you need a refill on your cardiac medications before your next appointment, please call your pharmacy*  Lab Work: None ordered at this time  If you have labs (blood work) drawn today and your tests are completely normal, you will receive your results only by: MyChart Message (if you have MyChart) OR A paper copy in the mail If you have any lab test that is abnormal or we need to change your treatment, we will call you to review the results.  Testing/Procedures: None ordered at this time   Follow-Up: At [facility], you and your health needs are our priority.  As part of our continuing mission to provide you with exceptional heart care, our providers are all part of one team.  This team includes your primary Cardiologist (physician) and Advanced Practice Providers or APPs (Physician Assistants and Nurse Practitioners) who all work together to provide you with the care you need, when you need it.  Your next appointment:   3 month(s)  Provider:   Deatrice Cage, MD or Barnie Hila, NP    We recommend signing up for the patient portal called MyChart.  Sign up information is provided on this After Visit Summary.  MyChart is used to connect with patients for Virtual Visits (Telemedicine).  Patients are able to view lab/test results, encounter notes, upcoming appointments, etc.  Non-urgent messages can be sent to your provider as well.   To learn more about what you can do with MyChart, go to [email or web address].

## 2024-05-23 NOTE — Progress Notes (Signed)
 Anesthesia Chart Review   Case: [id number] Date/Time: 05/24/24 1500   Procedures:      CYSTOSCOPY/URETEROSCOPY/HOLMIUM LASER/STENT PLACEMENT (Right) - STENT EXCHANGE     CYSTOSCOPY, WITH RETROGRADE PYELOGRAM (Right)   Anesthesia type: General   Diagnosis: Ureteral stone [N20.1]   Pre-op diagnosis: RIGHT URETERAL STONE   Location: [facility] 03 / [facility]   Surgeons: Alvaro Ricardo KATHEE Mickey., MD       DISCUSSION:73 y.o. never smoker with h/o HTN, sleep apnea, atrial fibrillation onset 2022, DM II, right ureteral stone scheduled for above procedure 05/24/24 with Dr. Ricardo Alvaro.   Ascending thoracic aortic aneurysm, Echo June 2025 demonstrated normal LV/RV function, normal PA pressure, mild dilatation of ascending aorta 44 mm (prior echo March 2022 ascending aorta measured 41 mm). Yearly imaging recommended.   Pt with admission 6/29-04/06/24 due to sepsis secondary to UTI. Found to be in a-fib with RVR during admission, converted to sinus ryhtm with amiodarone . Discharged on metoprolol  and Eliquis .  Pt was discharged to SNF.    Pt had follow up with cardiology 05/22/24. Per OV note, ccording to the RCRI, patient has a 0.4% risk of MACE. Patient reports activity equivalent to 4.0 METS (walks and performs light to moderate household activities).  -Based on ACC/AHA guidelines, Estefani Bateson would be at acceptable risk for the planned procedure without further cardiovascular testing.  -Per Pharm D, patient has not had an Afib/aflutter ablation within the last 3 months or DCCV within the last 30 days. Patient may hold Eliquis  for 2 days prior to procedure.  Patient will not need bridging with Lovenox  around procedure.   VS: Ht 5' 7 (1.702 m)   Wt 78.5 kg   BMI 27.10 kg/m   PROVIDERS: Buren Rock HERO, MD is PCP   Primary Cardiologist:  Deatrice Cage, MD   LABS: Labs DOS, pt not seen in PAT  (all labs ordered are listed, but only abnormal results are displayed)  Labs Reviewed - No data to  display   IMAGES:   EKG:   CV:  Past Medical History:  Diagnosis Date   Atrial fibrillation (HCC)    Cough    CHRONIC   Diabetes mellitus without complication (HCC)    Dysrhythmia    GERD (gastroesophageal reflux disease)    History of kidney stones    Hypertension    Sleep apnea    no cpap   Tremor    HEAD    Past Surgical History:  Procedure Laterality Date   ABDOMINAL HYSTERECTOMY     CATARACT EXTRACTION W/PHACO Left 03/07/2018   Procedure: CATARACT EXTRACTION PHACO AND INTRAOCULAR LENS PLACEMENT (IOC);  Surgeon: Jaye Fallow, MD;  Location: [facility];  Service: Ophthalmology;  Laterality: Left;  Lot # [id number] H US : 04:19.8 AP%: 19.9 CDE: 51.70   CATARACT EXTRACTION W/PHACO Right 07/07/2020   Procedure: CATARACT EXTRACTION PHACO AND INTRAOCULAR LENS PLACEMENT (IOC) RIGHT DIABETIC 7.75 00:42.8;  Surgeon: Jaye Fallow, MD;  Location: [facility];  Service: Ophthalmology;  Laterality: Right;  diabetic - oral meds   CESAREAN SECTION     COLONOSCOPY     CYSTOSCOPY W/ RETROGRADES Right 01/07/2019   Procedure: CYSTOSCOPY WITH RETROGRADE PYELOGRAM;  Surgeon: Twylla Glendia BROCKS, MD;  Location: [facility];  Service: Urology;  Laterality: Right;   CYSTOSCOPY W/ URETERAL STENT PLACEMENT Right 12/28/2020   Procedure: CYSTOSCOPY WITH RETROGRADE PYELOGRAM/URETERAL STENT PLACEMENT;  Surgeon: Watt Rush, MD;  Location: [facility];  Service: Urology;  Laterality: Right;   CYSTOSCOPY W/ URETERAL STENT  PLACEMENT Right 03/31/2024   Procedure: CYSTOSCOPY, WITH RETROGRADE PYELOGRAM AND URETERAL STENT INSERTION;  Surgeon: Alvaro Ricardo KATHEE Mickey., MD;  Location: [facility];  Service: Urology;  Laterality: Right;   CYSTOSCOPY WITH STENT PLACEMENT Right 01/07/2019   Procedure: CYSTOSCOPY WITH STENT PLACEMENT;  Surgeon: Twylla Glendia BROCKS, MD;  Location: [facility];  Service: Urology;  Laterality: Right;   CYSTOSCOPY WITH URETEROSCOPY, STONE BASKETRY AND STENT PLACEMENT Right 01/29/2019    Procedure: CYSTOSCOPY/URETEROSCOPY/HOLMIUM LASER/STENT EXCHANGE;  Surgeon: Twylla Glendia BROCKS, MD;  Location: [facility];  Service: Urology;  Laterality: Right;   CYSTOSCOPY/URETEROSCOPY/HOLMIUM LASER/STENT PLACEMENT Right 02/05/2021   Procedure: CYSTOSCOPY/URETEROSCOPY/HOLMIUM LASER/STENT EXCHANGE;  Surgeon: Francisca Redell BROCKS, MD;  Location: [facility];  Service: Urology;  Laterality: Right;   ESOPHAGOGASTRODUODENOSCOPY N/A 01/02/2021   Procedure: ESOPHAGOGASTRODUODENOSCOPY (EGD);  Surgeon: Toledo, Ladell POUR, MD;  Location: [facility];  Service: Gastroenterology;  Laterality: N/A;   JOINT REPLACEMENT Left    knee    MEDICATIONS:  apixaban  (ELIQUIS ) 5 MG TABS tablet   atorvastatin  (LIPITOR) 40 MG tablet   erythromycin ophthalmic ointment   hydrochlorothiazide  (HYDRODIURIL ) 12.5 MG tablet   JARDIANCE  10 MG TABS tablet   losartan (COZAAR) 25 MG tablet   metoprolol  succinate (TOPROL -XL) 100 MG 24 hr tablet   oxyCODONE  (OXY IR/ROXICODONE ) 5 MG immediate release tablet   TRADJENTA 5 MG TABS tablet   No current facility-administered medications for this encounter.      Harlene Hoots Ward, PA-C [facility] [phone]

## 2024-05-23 NOTE — Anesthesia Preprocedure Evaluation (Addendum)
 Anesthesia Evaluation  Patient identified by MRN, date of birth, ID band Patient awake    Reviewed: Allergy & Precautions, NPO status , Patient's Chart, lab work & pertinent test results  Airway Mallampati: II  TM Distance: >3 FB Neck ROM: Full    Dental no notable dental hx. (+) Teeth Intact, Dental Advisory Given   Pulmonary sleep apnea    Pulmonary exam normal breath sounds clear to auscultation       Cardiovascular hypertension, (-) angina (-) Past MI Normal cardiovascular exam+ dysrhythmias Atrial Fibrillation  Rhythm:Regular Rate:Normal     Neuro/Psych    GI/Hepatic   Endo/Other  diabetes    Renal/GU Renal diseaseLab Results      Component                Value               Date                                K                        4.2                 04/04/2024                CO2                      26                  04/04/2024                BUN                      25 (H)              04/04/2024                CREATININE               1.20 (H)            04/04/2024                GFRNONAA                 48 (L)              04/04/2024                CALCIUM                   8.5 (L)             04/04/2024                PHOS                     3.7                 12/31/2020                ALBUMIN                  2.5 (L)             04/01/2024                GLUCOSE  158 (H)             04/04/2024                Musculoskeletal   Abdominal   Peds  Hematology  (+) REFUSES BLOOD PRODUCTSLab Results      Component                Value               Date                      WBC                      8.4                 04/04/2024                HGB                      10.6 (L)            04/04/2024                HCT                      32.4 (L)            04/04/2024                MCV                      80.2                04/04/2024                PLT                      254                  04/04/2024              Anesthesia Other Findings All: nickel  Reproductive/Obstetrics                              Anesthesia Physical Anesthesia Plan  ASA: 2  Anesthesia Plan: General   Post-op Pain Management: Precedex  and Ofirmev  IV (intra-op)*   Induction: Intravenous  PONV Risk Score and Plan: 3 and Treatment may vary due to age or medical condition and Ondansetron   Airway Management Planned: Oral ETT  Additional Equipment: None  Intra-op Plan:   Post-operative Plan: Extubation in OR  Informed Consent: I have reviewed the patients History and Physical, chart, labs and discussed the procedure including the risks, benefits and alternatives for the proposed anesthesia with the patient or authorized representative who has indicated his/her understanding and acceptance.     Dental advisory given  Plan Discussed with: CRNA and Surgeon  Anesthesia Plan Comments: (See PAT note 05/14/24)         Anesthesia Quick Evaluation

## 2024-05-24 ENCOUNTER — Ambulatory Visit (HOSPITAL_COMMUNITY): Payer: Self-pay | Admitting: Medical

## 2024-05-24 ENCOUNTER — Encounter (HOSPITAL_COMMUNITY)
Admission: RE | Disposition: A | Discharge status: Home / Self Care | Payer: Self-pay | Source: Ambulatory Visit | Attending: Urology

## 2024-05-24 ENCOUNTER — Ambulatory Visit (HOSPITAL_COMMUNITY)

## 2024-05-24 ENCOUNTER — Ambulatory Visit (HOSPITAL_COMMUNITY): Admitting: Anesthesiology

## 2024-05-24 ENCOUNTER — Encounter (HOSPITAL_COMMUNITY): Payer: Self-pay | Admitting: Urology

## 2024-05-24 ENCOUNTER — Ambulatory Visit (HOSPITAL_COMMUNITY)
Admission: RE | Admit: 2024-05-24 | Discharge: 2024-05-24 | Disposition: A | Discharge status: Home / Self Care | Source: Ambulatory Visit | Attending: Urology | Admitting: Urology

## 2024-05-24 DIAGNOSIS — G4730 Sleep apnea, unspecified: Secondary | ICD-10-CM | POA: Insufficient documentation

## 2024-05-24 DIAGNOSIS — N201 Calculus of ureter: Secondary | ICD-10-CM

## 2024-05-24 DIAGNOSIS — I4891 Unspecified atrial fibrillation: Secondary | ICD-10-CM | POA: Diagnosis not present

## 2024-05-24 DIAGNOSIS — E119 Type 2 diabetes mellitus without complications: Secondary | ICD-10-CM | POA: Diagnosis not present

## 2024-05-24 DIAGNOSIS — E1165 Type 2 diabetes mellitus with hyperglycemia: Secondary | ICD-10-CM

## 2024-05-24 DIAGNOSIS — Z01818 Encounter for other preprocedural examination: Secondary | ICD-10-CM

## 2024-05-24 HISTORY — PX: CYSTOSCOPY W/ RETROGRADES: SHX1426

## 2024-05-24 HISTORY — PX: CYSTOSCOPY/URETEROSCOPY/HOLMIUM LASER/STENT PLACEMENT: SHX6546

## 2024-05-24 LAB — CBC
HCT: 37.9 % (ref 36.0–46.0)
Hemoglobin: 11.4 g/dL — ABNORMAL LOW (ref 12.0–15.0)
MCH: 25.5 pg — ABNORMAL LOW (ref 26.0–34.0)
MCHC: 30.1 g/dL (ref 30.0–36.0)
MCV: 84.8 fL (ref 80.0–100.0)
Platelets: 365 K/uL (ref 150–400)
RBC: 4.47 MIL/uL (ref 3.87–5.11)
RDW: 14.6 % (ref 11.5–15.5)
WBC: 6.3 K/uL (ref 4.0–10.5)
nRBC: 0 % (ref 0.0–0.2)

## 2024-05-24 LAB — BASIC METABOLIC PANEL WITH GFR
Anion gap: 10 (ref 5–15)
BUN: 20 mg/dL (ref 8–23)
CO2: 23 mmol/L (ref 22–32)
Calcium: 9.1 mg/dL (ref 8.9–10.3)
Chloride: 108 mmol/L (ref 98–111)
Creatinine, Ser: 1.58 mg/dL — ABNORMAL HIGH (ref 0.44–1.00)
GFR, Estimated: 34 mL/min — ABNORMAL LOW (ref >60)
Glucose, Bld: 116 mg/dL — ABNORMAL HIGH (ref 70–99)
Potassium: 3.6 mmol/L (ref 3.5–5.1)
Sodium: 141 mmol/L (ref 135–145)

## 2024-05-24 LAB — NO BLOOD PRODUCTS

## 2024-05-24 LAB — GLUCOSE, CAPILLARY: Glucose-Capillary: 111 mg/dL — ABNORMAL HIGH (ref 70–99)

## 2024-05-24 SURGERY — CYSTOSCOPY/URETEROSCOPY/HOLMIUM LASER/STENT PLACEMENT
Anesthesia: General | Laterality: Right

## 2024-05-24 MED ORDER — CHLORHEXIDINE GLUCONATE 0.12 % MT SOLN
15.0000 mL | Freq: Once | OROMUCOSAL | Status: AC
  Administered 2024-05-24: 15 mL via OROMUCOSAL

## 2024-05-24 MED ORDER — ONDANSETRON HCL 4 MG/2ML IJ SOLN: 4.0000 mg | Freq: Once | INTRAMUSCULAR | Status: DC | PRN

## 2024-05-24 MED ORDER — OXYCODONE HCL 5 MG/5ML PO SOLN: 5.0000 mg | Freq: Once | ORAL | Status: DC | PRN

## 2024-05-24 MED ORDER — ONDANSETRON HCL 4 MG/2ML IJ SOLN
INTRAMUSCULAR | Status: DC | PRN
  Administered 2024-05-24: 4 mg via INTRAVENOUS

## 2024-05-24 MED ORDER — ORAL CARE MOUTH RINSE: 15.0000 mL | Freq: Once | OROMUCOSAL | Status: AC

## 2024-05-24 MED ORDER — GENTAMICIN SULFATE 40 MG/ML IJ SOLN
5.0000 mg/kg | INTRAVENOUS | Status: AC
  Administered 2024-05-24: 400 mg via INTRAVENOUS
  Filled 2024-05-24: qty 10

## 2024-05-24 MED ORDER — ACETAMINOPHEN 10 MG/ML IV SOLN: 1000.0000 mg | Freq: Once | INTRAVENOUS | Status: DC | PRN

## 2024-05-24 MED ORDER — LACTATED RINGERS IV SOLN: INTRAVENOUS | Status: DC

## 2024-05-24 MED ORDER — DEXMEDETOMIDINE HCL IN NACL 80 MCG/20ML IV SOLN
INTRAVENOUS | Status: AC
  Filled 2024-05-24: qty 20

## 2024-05-24 MED ORDER — SUGAMMADEX SODIUM 200 MG/2ML IV SOLN
INTRAVENOUS | Status: DC | PRN
Start: 2024-05-24 — End: 2024-05-24
  Administered 2024-05-24: 200 mg via INTRAVENOUS

## 2024-05-24 MED ORDER — FENTANYL CITRATE (PF) 100 MCG/2ML IJ SOLN
INTRAMUSCULAR | Status: AC
  Filled 2024-05-24: qty 2

## 2024-05-24 MED ORDER — ONDANSETRON HCL 4 MG/2ML IJ SOLN
INTRAMUSCULAR | Status: AC
  Filled 2024-05-24: qty 2

## 2024-05-24 MED ORDER — FENTANYL CITRATE (PF) 100 MCG/2ML IJ SOLN
INTRAMUSCULAR | Status: DC | PRN
  Administered 2024-05-24: 100 ug via INTRAVENOUS
  Administered 2024-05-24: 50 ug via INTRAVENOUS

## 2024-05-24 MED ORDER — SODIUM CHLORIDE 0.9 % IR SOLN
Status: DC | PRN
  Administered 2024-05-24: 3000 mL via INTRAVESICAL

## 2024-05-24 MED ORDER — HYDROMORPHONE HCL 1 MG/ML IJ SOLN: 0.2500 mg | INTRAMUSCULAR | Status: DC | PRN

## 2024-05-24 MED ORDER — PHENYLEPHRINE 80 MCG/ML (10ML) SYRINGE FOR IV PUSH (FOR BLOOD PRESSURE SUPPORT)
PREFILLED_SYRINGE | INTRAVENOUS | Status: AC
  Filled 2024-05-24: qty 10

## 2024-05-24 MED ORDER — ROCURONIUM BROMIDE 10 MG/ML (PF) SYRINGE
PREFILLED_SYRINGE | INTRAVENOUS | Status: AC
  Filled 2024-05-24: qty 10

## 2024-05-24 MED ORDER — LIDOCAINE HCL (PF) 2 % IJ SOLN
INTRAMUSCULAR | Status: AC
  Filled 2024-05-24: qty 5

## 2024-05-24 MED ORDER — OXYCODONE HCL 5 MG PO TABS: 5.0000 mg | ORAL_TABLET | Freq: Four times a day (QID) | ORAL | 0 refills | Status: AC | PRN

## 2024-05-24 MED ORDER — OXYCODONE HCL 5 MG PO TABS: 5.0000 mg | ORAL_TABLET | Freq: Once | ORAL | Status: DC | PRN

## 2024-05-24 MED ORDER — PROPOFOL 10 MG/ML IV BOLUS
INTRAVENOUS | Status: DC | PRN
  Administered 2024-05-24: 150 mg via INTRAVENOUS
  Administered 2024-05-24: 50 mg via INTRAVENOUS

## 2024-05-24 MED ORDER — DEXAMETHASONE SODIUM PHOSPHATE 10 MG/ML IJ SOLN
INTRAMUSCULAR | Status: AC
  Filled 2024-05-24: qty 1

## 2024-05-24 MED ORDER — ROCURONIUM BROMIDE 10 MG/ML (PF) SYRINGE
PREFILLED_SYRINGE | INTRAVENOUS | Status: DC | PRN
  Administered 2024-05-24: 50 mg via INTRAVENOUS

## 2024-05-24 MED ORDER — SUGAMMADEX SODIUM 200 MG/2ML IV SOLN
INTRAVENOUS | Status: AC
  Filled 2024-05-24: qty 2

## 2024-05-24 MED ORDER — IOHEXOL 300 MG/ML  SOLN
INTRAMUSCULAR | Status: DC | PRN
  Administered 2024-05-24: 10 mL

## 2024-05-24 MED ORDER — ACETAMINOPHEN 10 MG/ML IV SOLN
1000.0000 mg | Freq: Once | INTRAVENOUS | Status: DC | PRN
Start: 2024-05-24 — End: 2024-05-24

## 2024-05-24 MED ORDER — FENTANYL CITRATE PF 50 MCG/ML IJ SOSY: 25.0000 ug | PREFILLED_SYRINGE | INTRAMUSCULAR | Status: DC | PRN

## 2024-05-24 MED ORDER — LACTATED RINGERS IV SOLN: INTRAVENOUS | Status: DC | PRN

## 2024-05-24 MED ORDER — PROPOFOL 10 MG/ML IV BOLUS
INTRAVENOUS | Status: AC
  Filled 2024-05-24: qty 20

## 2024-05-24 MED ORDER — DEXAMETHASONE SODIUM PHOSPHATE 10 MG/ML IJ SOLN
INTRAMUSCULAR | Status: DC | PRN
  Administered 2024-05-24: 8 mg via INTRAVENOUS

## 2024-05-24 MED ORDER — LIDOCAINE HCL (PF) 2 % IJ SOLN
INTRAMUSCULAR | Status: DC | PRN
  Administered 2024-05-24: 100 mg via INTRADERMAL

## 2024-05-24 SURGICAL SUPPLY — 18 items
BAG URO CATCHER STRL LF (MISCELLANEOUS) ×1 IMPLANT
BASKET LASER NITINOL 1.9FR (BASKET) IMPLANT
CATH URETL OPEN END 6FR 70 (CATHETERS) ×1 IMPLANT
CLOTH BEACON ORANGE TIMEOUT ST (SAFETY) ×1 IMPLANT
GLOVE SURG LX STRL 7.5 STRW (GLOVE) ×1 IMPLANT
GOWN STRL REUS W/ TWL XL LVL3 (GOWN DISPOSABLE) ×1 IMPLANT
GUIDEWIRE ANG ZIPWIRE 038X150 (WIRE) ×1 IMPLANT
GUIDEWIRE STR DUAL SENSOR (WIRE) ×1 IMPLANT
KIT TURNOVER KIT A (KITS) ×1 IMPLANT
MANIFOLD NEPTUNE II (INSTRUMENTS) ×1 IMPLANT
NS IRRIG 1000ML POUR BTL (IV SOLUTION) IMPLANT
PACK CYSTO (CUSTOM PROCEDURE TRAY) ×1 IMPLANT
SHEATH NAVIGATOR HD 11/13X28 (SHEATH) IMPLANT
SHEATH NAVIGATOR HD 11/13X36 (SHEATH) IMPLANT
TRACTIP FLEXIVA PULS ID 200XHI (Laser) IMPLANT
TUBE PU 8FR 16IN ENFIT (TUBING) ×1 IMPLANT
TUBING CONNECTING 10 (TUBING) ×1 IMPLANT
TUBING UROLOGY SET (TUBING) ×1 IMPLANT

## 2024-05-24 NOTE — Anesthesia Postprocedure Evaluation (Signed)
 Anesthesia Post Note  [name]  Procedure(s) Performed: CYSTOSCOPY/DIAGNOSTIC URETEROSCOPY/ STENT REMOVAL (Right) CYSTOSCOPY, WITH RIGHT RETROGRADE PYELOGRAM (Right)     Patient location during evaluation: PACU Anesthesia Type: General Level of consciousness: awake and alert Pain management: pain level controlled Vital Signs Assessment: post-procedure vital signs reviewed and stable Respiratory status: spontaneous breathing, nonlabored ventilation and respiratory function stable Cardiovascular status: blood pressure returned to baseline and stable Postop Assessment: no apparent nausea or vomiting Anesthetic complications: no   No notable events documented.                 Danaiya Steadman

## 2024-05-24 NOTE — Transfer of Care (Signed)
 Immediate Anesthesia Transfer of Care Note  [name]  Procedure(s) Performed: CYSTOSCOPY/DIAGNOSTIC URETEROSCOPY/ STENT REMOVAL (Right) CYSTOSCOPY, WITH RIGHT RETROGRADE PYELOGRAM (Right)  Patient Location: PACU  Anesthesia Type:General  Level of Consciousness: sedated and responds to stimulation  Airway & Oxygen Therapy: Patient Spontanous Breathing and Patient connected to face mask oxygen  Post-op Assessment: Report given to RN and Post -op Vital signs reviewed and unstable, Anesthesiologist notified  Post vital signs: Reviewed and stable  Last Vitals:  Vitals Value Taken Time  BP 152/77 05/24/24 15:54  Temp    Pulse 68 05/24/24 15:58  Resp 11 05/24/24 15:58  SpO2 100 % 05/24/24 15:58  Vitals shown include unfiled device data.  Last Pain:  Vitals:   05/24/24 1313  TempSrc: Oral  PainSc: 0-No pain         Complications: No notable events documented.

## 2024-05-24 NOTE — Brief Op Note (Signed)
 05/24/2024  3:32 PM  PATIENT:  Cynthia Lucas  73 y.o. female  PRE-OPERATIVE DIAGNOSIS:  RIGHT URETERAL STONE  POST-OPERATIVE DIAGNOSIS:  RIGHT URETERAL STONE  PROCEDURE:  Procedure(s) with comments: CYSTOSCOPY/DIAGNOSTIC URETEROSCOPY/ STENT REMOVAL (Right) - STENT EXCHANGE CYSTOSCOPY, WITH RIGHT RETROGRADE PYELOGRAM (Right)  SURGEON:  Surgeons and Role:    * Manny, Ricardo KATHEE Raddle., MD - Primary  PHYSICIAN ASSISTANT:   ASSISTANTS: none   ANESTHESIA:   general  EBL:  minimal   BLOOD ADMINISTERED:none  DRAINS: none   LOCAL MEDICATIONS USED:  NONE  SPECIMEN:  No Specimen  DISPOSITION OF SPECIMEN:  N/A  COUNTS:  YES  TOURNIQUET:  * No tourniquets in log *  DICTATION: .Other Dictation: Dictation Number [id number]  PLAN OF CARE: Discharge to home after PACU  PATIENT DISPOSITION:  PACU - hemodynamically stable.   Delay start of Pharmacological VTE agent (>24hrs) due to surgical blood loss or risk of bleeding: yes

## 2024-05-24 NOTE — H&P (Signed)
 Cynthia Lucas is an 73 y.o. female.    Chief Complaint: Pre-Op RIGHT Ureteroscopic Stone Manipulation  HPI:   1 - RIGHT Ureteral Stone - s/p emergent Rt JJ stent 03/2024 for 4mm Rt distal 1/3 stone x2 on ER CT on eval sepssis. Moderate hydro, no additional foci of stones.   2 - Urosepsis - pan-sensitive e. coli bacteremia / urosepsis 6/29 treated with stent and ABX.   3 - Stage 3 Renal Insufficiency - Cr 2's at baseline. Ho DM.   PMH sig for AFib (follows cards), DM2, severe tremor/ uses walker. Lives with family at baseline. Alyse Jansky also very involved. SABRA Her PCP is Ethridge Matsu MD.   Today Dandra is seen to proced with RIGHT ureteroscopic stone manipulation. ON CX-specific keflex pre-op to minimize colonization. No interval fevers.   Past Medical History:  Diagnosis Date   Atrial fibrillation (HCC)    Cough    CHRONIC   Diabetes mellitus without complication (HCC)    Dysrhythmia    GERD (gastroesophageal reflux disease)    History of kidney stones    Hypertension    Sleep apnea    no cpap   Tremor    HEAD    Past Surgical History:  Procedure Laterality Date   ABDOMINAL HYSTERECTOMY     CATARACT EXTRACTION W/PHACO Left 03/07/2018   Procedure: CATARACT EXTRACTION PHACO AND INTRAOCULAR LENS PLACEMENT (IOC);  Surgeon: Jaye Fallow, MD;  Location: [facility];  Service: Ophthalmology;  Laterality: Left;  Lot # [id number] H US : 04:19.8 AP%: 19.9 CDE: 51.70   CATARACT EXTRACTION W/PHACO Right 07/07/2020   Procedure: CATARACT EXTRACTION PHACO AND INTRAOCULAR LENS PLACEMENT (IOC) RIGHT DIABETIC 7.75 00:42.8;  Surgeon: Jaye Fallow, MD;  Location: [facility];  Service: Ophthalmology;  Laterality: Right;  diabetic - oral meds   CESAREAN SECTION     COLONOSCOPY     CYSTOSCOPY W/ RETROGRADES Right 01/07/2019   Procedure: CYSTOSCOPY WITH RETROGRADE PYELOGRAM;  Surgeon: Twylla Glendia BROCKS, MD;  Location: [facility];  Service: Urology;  Laterality: Right;    CYSTOSCOPY W/ URETERAL STENT PLACEMENT Right 12/28/2020   Procedure: CYSTOSCOPY WITH RETROGRADE PYELOGRAM/URETERAL STENT PLACEMENT;  Surgeon: Watt Rush, MD;  Location: [facility];  Service: Urology;  Laterality: Right;   CYSTOSCOPY W/ URETERAL STENT PLACEMENT Right 03/31/2024   Procedure: CYSTOSCOPY, WITH RETROGRADE PYELOGRAM AND URETERAL STENT INSERTION;  Surgeon: Alvaro Ricardo KATHEE Mickey., MD;  Location: [facility];  Service: Urology;  Laterality: Right;   CYSTOSCOPY WITH STENT PLACEMENT Right 01/07/2019   Procedure: CYSTOSCOPY WITH STENT PLACEMENT;  Surgeon: Twylla Glendia BROCKS, MD;  Location: [facility];  Service: Urology;  Laterality: Right;   CYSTOSCOPY WITH URETEROSCOPY, STONE BASKETRY AND STENT PLACEMENT Right 01/29/2019   Procedure: CYSTOSCOPY/URETEROSCOPY/HOLMIUM LASER/STENT EXCHANGE;  Surgeon: Twylla Glendia BROCKS, MD;  Location: [facility];  Service: Urology;  Laterality: Right;   CYSTOSCOPY/URETEROSCOPY/HOLMIUM LASER/STENT PLACEMENT Right 02/05/2021   Procedure: CYSTOSCOPY/URETEROSCOPY/HOLMIUM LASER/STENT EXCHANGE;  Surgeon: Francisca Redell BROCKS, MD;  Location: [facility];  Service: Urology;  Laterality: Right;   ESOPHAGOGASTRODUODENOSCOPY N/A 01/02/2021   Procedure: ESOPHAGOGASTRODUODENOSCOPY (EGD);  Surgeon: Toledo, Ladell POUR, MD;  Location: [facility];  Service: Gastroenterology;  Laterality: N/A;   JOINT REPLACEMENT Left    knee    Family History  Problem Relation Age of Onset   Heart disease Mother    Breast cancer Neg Hx    Social History:  reports that she has never smoked. She has never used smokeless tobacco. She reports current alcohol use. She reports that she does  not use drugs.  Allergies:  Allergies  Allergen Reactions   Nickel Itching and Swelling   Other     Contact lense solution causes eye irritation    No medications prior to admission.    No results found for this or any previous visit (from the past 48 hours). No results found.  Review of Systems  Constitutional:   Negative for chills and fever.  All other systems reviewed and are negative.   There were no vitals taken for this visit. Physical Exam Vitals reviewed.  HENT:     Head: Normocephalic.  Eyes:     Pupils: Pupils are equal, round, and reactive to light.  Cardiovascular:     Rate and Rhythm: Normal rate.  Pulmonary:     Effort: Pulmonary effort is normal.  Abdominal:     General: Abdomen is flat.  Genitourinary:    Comments: No CVAT at present Musculoskeletal:        General: Normal range of motion.     Cervical back: Normal range of motion.  Skin:    General: Skin is warm.  Neurological:     General: No focal deficit present.     Mental Status: She is alert.  Psychiatric:        Mood and Affect: Mood normal.      Assessment/Plan  Proceed as planned with RIGHT ureteroscopic stone manipulation. Risks, benefits, alternatives, expected peri-op course discussed previously and reiterated today. Her baseline neurologic comorbidity and chronic bacteruria increases risks substantially.   Ricardo KATHEE Alvaro Mickey., MD 05/24/2024, 10:38 AM

## 2024-05-24 NOTE — Discharge Instructions (Addendum)
 1 - You may have urinary urgency (bladder spasms) and bloody urine on / off for about 2 days. This is normal.  2 - Call MD or go to ER for fever >102, severe pain / nausea / vomiting not relieved by medications, or acute change in medical status

## 2024-05-24 NOTE — Anesthesia Procedure Notes (Signed)
 Procedure Name: Intubation Date/Time: 05/24/2024 3:06 PM  Performed by: Obadiah Reyes BROCKS, CRNAPre-anesthesia Checklist: Patient identified, Emergency Drugs available, Suction available and Patient being monitored Patient Re-evaluated:Patient Re-evaluated prior to induction Oxygen Delivery Method: Circle System Utilized Preoxygenation: Pre-oxygenation with 100% oxygen Induction Type: IV induction Ventilation: Mask ventilation without difficulty Laryngoscope Size: Miller and 2 Grade View: Grade I Tube type: Oral Number of attempts: 1 Airway Equipment and Method: Stylet and Oral airway Placement Confirmation: ETT inserted through vocal cords under direct vision, positive ETCO2 and breath sounds checked- equal and bilateral Secured at: 20 cm Tube secured with: Tape Dental Injury: Teeth and Oropharynx as per pre-operative assessment

## 2024-05-25 ENCOUNTER — Encounter (HOSPITAL_COMMUNITY): Payer: Self-pay | Admitting: Urology

## 2024-05-25 NOTE — Op Note (Unsigned)
 Cynthia Lucas, MORAND MEDICAL RECORD NO: [id number] ACCOUNT NO: [id number] DATE OF BIRTH: May 21, 1951 FACILITY: [facility] LOCATION: [facility] PHYSICIAN: Ricardo Likens, MD  Operative Report   DATE OF PROCEDURE: 05/24/2024  PREOPERATIVE DIAGNOSIS:  History of right ureteral stone and urosepsis.   PROCEDURES PERFORMED: 1.  Cystoscopy with right retrograde pyelogram with interpretation. 2.  Right diagnostic ureteroscopy. 3.  Right ureteral stent removal.  ESTIMATED BLOOD LOSS:  Nil.  COMPLICATIONS:  None.  SPECIMENS:  None.  FINDINGS: 1.  Interval passage of right distal ureteral stones. 2.  Unremarkable kidney and right ureter. 3.  Successful removal of right ureteral stent.  INDICATIONS:  The patient is a pleasant but somewhat comorbid 73 year old lady with a history of severe tremor.  She was found on workup of flank pain and fevers to have essentially urosepsis in June 2025.  She was temporized at that time with a ureteral  stent, admitted for IV antibiotics and had a prolonged course requiring inpatient rehab afterwards after her severe sepsis, which was fortunately due to only pansensitive E. coli.  She presented to the office.  We discussed more definitive management of  her stones.  We agreed upon ureteroscopy with the goal of stone free. The patient presents for this today.  Informed consent was obtained and placed in the medical record. She has been on preoperative antibiotics according to culture.   DESCRIPTION OF PROCEDURE:  The patient being Cynthia Lucas identified and the procedure being right ureteroscopic stone manipulation was confirmed.  Procedure timeout was performed.  Intravenous antibiotics were administered.  General endotracheal  anesthesia was induced.  The patient was placed into a low lithotomy position.  Sterile field was created, prepped and draped the patient's vagina, introitus and proximal thigh using iodine .  Cystourethroscopy performed using 21-French rigid  cystoscope  with offset lens.  Inspection of the urinary bladder revealed no diverticuli, calcifications, or papillary lesions.  The urine was somewhat proteinaceous.  The right distal end of the stent was grasped, brought to the level of the urethral meatus,  exchanged for an open-ended catheter via a zip wire, and right retrograde pyelogram was obtained.   Right retrograde pyelogram demonstrated a single right ureter and single system right kidney.  No obvious filling defects or narrowing noted.  A ZIPwire was once again advanced at the level of the kidney and set aside as a safety wire.  An 8-French  feeding tube was placed in the urinary bladder for pressure release.  A semi-rigid ureteroscopy was performed of the distal seventh-eighths of the right ureter alongside as separate sensor working wire.  No mucosal abnormalities were seen or stones were  seen whatsoever.  The ureter was quite capacious having been pre-stented. This was felt to likely represent either interval stone passage versus retrograde positioning at the time of her prior stent and as the goal today was to verify stone-free, I  exchanged the semirigid ureteroscope for a short length ureteral access sheath via a sensor working wire and flexible digital ureteroscopy was performed in the proximal ureter and systemic inspection of the right kidney including all calyces x 3.  No  evidence of intraluminal stones was seen whatsoever.  This verified interval stone passage. The access sheath was very carefully removed under continuing vision.  No significant mucosal abnormalities were found.  It was not felt that interval stenting or  additional stenting would be warranted.  The procedure was terminated.  The patient tolerated the procedure well.  No immediate periprocedural complications.  The  patient was taken to the post-anesthesia care unit in stable condition with plan for  discharge home.   MUK D: 05/24/2024 3:37:27 pm T: 05/25/2024  12:55:00 am  JOB: [id number]/ [id number]

## 2024-06-14 ENCOUNTER — Other Ambulatory Visit: Payer: Self-pay | Admitting: Family Medicine

## 2024-06-14 DIAGNOSIS — Z1231 Encounter for screening mammogram for malignant neoplasm of breast: Secondary | ICD-10-CM

## 2024-07-08 ENCOUNTER — Encounter

## 2024-08-21 NOTE — Progress Notes (Deleted)
 Cardiology Clinic Note   Date: 08/21/2024 ID: Cynthia Lucas, DOB 1951/01/31, MRN [id number]  Primary Cardiologist:  Deatrice Cage, MD  Chief Complaint   Cynthia Lucas is a 73 y.o. female who presents to the clinic today for ***  Patient Profile   Cynthia Lucas is followed by *** for the history outlined below.       Past medical history significant for: PAF. Onset March 2022. Ascending thoracic aortic aneurysm. Echo 03/31/2024: EF 55 to 60%.  No RWMA.  Mild LVH.  Indeterminate diastolic parameters.  Normal RV size/function.  Normal PA pressure RVSP 31.7 mmHg.  Mild MR/AI.  Mild dilatation of ascending aorta 44 mm. Hypertension. Hyperlipidemia. Lipid panel 04/26/2022: Direct LDL 156, HDL 44, TG 273, total 248. T2DM. GI bleed. 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   In summary, patient was admitted in March 2022 with left flank pain and coffee-ground emesis.  She was found to be in new onset A-fib with RVR.  In addition she was noted to be in acute renal failure with a creatinine of 6.5 as well as gradual drop in her hemoglobin to she was rate controlled with diltiazem .  Troponin was mildly elevated and felt to be related to supply demand ischemia.  Echo showed normal LV function with a EF of 55 to 60%, no RWMA, mild LVH, normal RV size/function, normal PA pressure, mild to moderate MR, moderate LAE, and mildly dilated ascending aorta.  She converted to sinus rhythm during hospital admission.  EGD to further evaluate drop in hemoglobin showed no significant abnormalities.  She was started on Eliquis  for stroke prophylaxis.  Acute renal failure was felt to be due to obstructive nephropathy with subsequent improvement following ureteral stent placement.  Lexiscan  in June 2022 showed no evidence of ischemia or infarct with minimal coronary artery calcification on CT attenuated corrected images.  She was last seen in follow-up in July 2022 and was without symptoms of  angina or decompensation.  Patient presented to the ED on 03/31/2024 with abdominal pain.  Patient lost her son in January.  It was reported that patient was in a severe depression with poor appetite and sleeping a lot.  Sister reported patient was essentially bedbound secondary to grief only going from bed to restroom.  Patient endorsed abdominal pain with no associated symptoms.  Patient was found to be in A-fib with RVR HR 176 bpm.  Initial labs: WBC 17.4, hemoglobin 13, hematocrit 39.7, sodium 135, potassium 2.9, creatinine 2.64, BUN 48, AST 60, ALT 31, respiratory panel negative, UTI grossly positive.  Patient was admitted for sepsis.  Chest x-ray was negative for acute cardiopulmonary abnormalities.  CT abdomen pelvis demonstrated moderate to severe acute obstructive uropathy on the right with hydronephrosis and hydroureter due to 2 adjacent ureteral calculi at the pelvic inlet > 4 mm, punctate additional right nephrolithiasis.  Patient reported nonadherence with Eliquis  prior to admission.  She was started on IV amiodarone  for rate control with improvement.  Urology was consulted and patient underwent cystoscopy with ureteral stent insertion on the right.  Postsurgery cardiology was consulted.  Patient reported improvement in abdominal pain.  Telemetry showed A-fib with 130 bpm.  Toprol  was stopped and patient was started on Lopressor  25 mg every 6 hours.  Amiodarone  drip was continued and patient was started on heparin .  Rates improved to 80 to 100 bpm.  Amiodarone  infusion was discontinued and metoprolol  was increased to 37.5 mg every 6 hours.  Patient converted to sinus rhythm.  Metoprolol  was consolidated to 50 mg twice daily.  She was transitioned from heparin  to Eliquis .  Patient was discharged to SNF on 04/06/2024.   Patient was last seen in the office by me on 05/22/2024 for pre-op evaluation prior to ureteroscopy. She was back at home after 21 days in inpatient rehab. She was pending workup for  Parkinsons given her baseline tremor. She was active performing light to moderate household activities. She was found to be an acceptable risk for surgery.      History of Present Illness    Today, patient ***  PAF Onset March 2022 in the setting of acute renal failure secondary to obstructive nephropathy.  Hospital admission in late June 2025 and found to be in A-fib with RVR in the setting of sepsis secondary to obstructive uropathy.  A-fib treated with IV amiodarone  with subsequent conversion to normal sinus rhythm.  Denies spontaneous bleeding concerns.  Patient *** - Continue Toprol  and Eliquis .   Ascending thoracic aortic aneurysm Echo June 2025 demonstrated normal LV/RV function, normal PA pressure, mild dilatation of ascending aorta 44 mm (prior echo March 2022 ascending aorta measured 41 mm).  Patient denies chest pain, back pain, lightheadedness, dizziness, presyncope or syncope. *** - Continue yearly surveillance.   Hypertension BP today ***. No dizziness or headaches.  - Continue losartan , hydrochlorothiazide  and Toprol .  Hyperlipidemia Direct LDL 150 07 April 2022, not at goal. - Continue atorvastatin . - Lipid panel***  ROS: All other systems reviewed and are otherwise negative except as noted in History of Present Illness.  EKGs/Labs Reviewed        04/01/2024: ALT 24; AST 22 05/24/2024: BUN 20; Creatinine, Ser 1.58; Potassium 3.6; Sodium 141   05/24/2024: Hemoglobin 11.4; WBC 6.3   No results found for requested labs within last 365 days.   No results found for requested labs within last 365 days.  ***  Risk Assessment/Calculations    {Does this patient have ATRIAL FIBRILLATION?:[id number]} No BP recorded.  {Refresh Note OR Click here to enter BP  :1}***        Physical Exam    VS:  There were no vitals taken for this visit. , BMI There is no height or weight on file to calculate BMI.  GEN: Well nourished, well developed, in no acute distress. Neck: No JVD  or carotid bruits. Cardiac: *** RRR. *** No murmur. No rubs or gallops.   Respiratory:  Respirations regular and unlabored. Clear to auscultation without rales, wheezing or rhonchi. GI: Soft, nontender, nondistended. Extremities: Radials/DP/PT 2+ and equal bilaterally. No clubbing or cyanosis. No edema ***  Skin: Warm and dry, no rash. Neuro: Strength intact.  Assessment & Plan   ***  Disposition: ***     {Are you ordering a CV Procedure (e.g. stress test, cath, DCCV, TEE, etc)?   Press F2        :[id number]}   Signed, Barnie HERO. Jemmie Rhinehart, DNP, NP-C

## 2024-08-26 ENCOUNTER — Ambulatory Visit: Attending: Student | Admitting: Student

## 2024-09-04 ENCOUNTER — Emergency Department

## 2024-09-04 ENCOUNTER — Other Ambulatory Visit: Payer: Self-pay

## 2024-09-04 ENCOUNTER — Observation Stay
Admission: EM | Admit: 2024-09-04 | Discharge: 2024-09-07 | DRG: 308 | Disposition: A | Attending: Internal Medicine | Admitting: Internal Medicine

## 2024-09-04 DIAGNOSIS — N1832 Chronic kidney disease, stage 3b: Secondary | ICD-10-CM | POA: Diagnosis present

## 2024-09-04 DIAGNOSIS — I10 Essential (primary) hypertension: Secondary | ICD-10-CM | POA: Insufficient documentation

## 2024-09-04 DIAGNOSIS — Z87442 Personal history of urinary calculi: Secondary | ICD-10-CM | POA: Insufficient documentation

## 2024-09-04 DIAGNOSIS — I509 Heart failure, unspecified: Secondary | ICD-10-CM

## 2024-09-04 DIAGNOSIS — I4891 Unspecified atrial fibrillation: Secondary | ICD-10-CM | POA: Diagnosis present

## 2024-09-04 DIAGNOSIS — R079 Chest pain, unspecified: Secondary | ICD-10-CM

## 2024-09-04 DIAGNOSIS — E119 Type 2 diabetes mellitus without complications: Secondary | ICD-10-CM

## 2024-09-04 DIAGNOSIS — Z91148 Patient's other noncompliance with medication regimen for other reason: Secondary | ICD-10-CM

## 2024-09-04 DIAGNOSIS — R0789 Other chest pain: Principal | ICD-10-CM

## 2024-09-04 LAB — TROPONIN T, HIGH SENSITIVITY
Troponin T High Sensitivity: <15 ng/L (ref 0–19)
Troponin T High Sensitivity: <15 ng/L (ref 0–19)

## 2024-09-04 LAB — BASIC METABOLIC PANEL WITH GFR
Anion gap: 11 (ref 5–15)
BUN: 27 mg/dL — ABNORMAL HIGH (ref 8–23)
CO2: 23 mmol/L (ref 22–32)
Calcium: 8.8 mg/dL — ABNORMAL LOW (ref 8.9–10.3)
Chloride: 108 mmol/L (ref 98–111)
Creatinine, Ser: 1.53 mg/dL — ABNORMAL HIGH (ref 0.44–1.00)
GFR, Estimated: 36 mL/min — ABNORMAL LOW (ref >60)
Glucose, Bld: 110 mg/dL — ABNORMAL HIGH (ref 70–99)
Potassium: 4 mmol/L (ref 3.5–5.1)
Sodium: 142 mmol/L (ref 135–145)

## 2024-09-04 LAB — MAGNESIUM: Magnesium: 2.1 mg/dL (ref 1.7–2.4)

## 2024-09-04 LAB — CBC
HCT: 34.4 % — ABNORMAL LOW (ref 36.0–46.0)
Hemoglobin: 11 g/dL — ABNORMAL LOW (ref 12.0–15.0)
MCH: 26.3 pg (ref 26.0–34.0)
MCHC: 32 g/dL (ref 30.0–36.0)
MCV: 82.1 fL (ref 80.0–100.0)
Platelets: 293 K/uL (ref 150–400)
RBC: 4.19 MIL/uL (ref 3.87–5.11)
RDW: 16.5 % — ABNORMAL HIGH (ref 11.5–15.5)
WBC: 8.3 K/uL (ref 4.0–10.5)
nRBC: 0 % (ref 0.0–0.2)

## 2024-09-04 LAB — PRO BRAIN NATRIURETIC PEPTIDE: Pro Brain Natriuretic Peptide: 3437 pg/mL — ABNORMAL HIGH (ref <300.0)

## 2024-09-04 MED ORDER — APIXABAN 5 MG PO TABS
5.0000 mg | ORAL_TABLET | Freq: Two times a day (BID) | ORAL | Status: DC
  Administered 2024-09-04 – 2024-09-07 (×6): 5 mg via ORAL
  Filled 2024-09-04 (×6): qty 1

## 2024-09-04 MED ORDER — ATORVASTATIN CALCIUM 20 MG PO TABS
40.0000 mg | ORAL_TABLET | Freq: Every day | ORAL | Status: DC
  Administered 2024-09-04 – 2024-09-06 (×3): 40 mg via ORAL
  Filled 2024-09-04 (×3): qty 2

## 2024-09-04 MED ORDER — LOSARTAN POTASSIUM 50 MG PO TABS
25.0000 mg | ORAL_TABLET | Freq: Every day | ORAL | Status: DC
  Administered 2024-09-05 – 2024-09-06 (×2): 25 mg via ORAL
  Filled 2024-09-04 (×3): qty 1

## 2024-09-04 MED ORDER — NITROGLYCERIN 0.4 MG SL SUBL: 0.4000 mg | SUBLINGUAL_TABLET | SUBLINGUAL | Status: DC | PRN

## 2024-09-04 MED ORDER — METOPROLOL TARTRATE 5 MG/5ML IV SOLN
5.0000 mg | Freq: Once | INTRAVENOUS | Status: AC
  Administered 2024-09-04: 5 mg via INTRAVENOUS
  Filled 2024-09-04: qty 5

## 2024-09-04 MED ORDER — IOHEXOL 350 MG/ML SOLN
60.0000 mL | Freq: Once | INTRAVENOUS | Status: AC | PRN
  Administered 2024-09-04: 60 mL via INTRAVENOUS

## 2024-09-04 MED ORDER — ONDANSETRON HCL 4 MG/2ML IJ SOLN: 4.0000 mg | Freq: Four times a day (QID) | INTRAMUSCULAR | Status: DC | PRN

## 2024-09-04 MED ORDER — FUROSEMIDE 10 MG/ML IJ SOLN
20.0000 mg | Freq: Two times a day (BID) | INTRAMUSCULAR | Status: DC
  Administered 2024-09-05 – 2024-09-07 (×3): 20 mg via INTRAVENOUS
  Filled 2024-09-04 (×3): qty 2
  Filled 2024-09-04: qty 4
  Filled 2024-09-04: qty 2

## 2024-09-04 MED ORDER — METOPROLOL SUCCINATE ER 50 MG PO TB24
100.0000 mg | ORAL_TABLET | Freq: Every day | ORAL | Status: DC
  Administered 2024-09-05 – 2024-09-06 (×2): 100 mg via ORAL
  Filled 2024-09-04: qty 1
  Filled 2024-09-04: qty 2
  Filled 2024-09-04: qty 1

## 2024-09-04 MED ORDER — METOPROLOL TARTRATE 25 MG PO TABS
25.0000 mg | ORAL_TABLET | Freq: Once | ORAL | Status: AC
  Administered 2024-09-04: 25 mg via ORAL
  Filled 2024-09-04: qty 1

## 2024-09-04 MED ORDER — FUROSEMIDE 10 MG/ML IJ SOLN
40.0000 mg | Freq: Once | INTRAMUSCULAR | Status: AC
  Administered 2024-09-04: 40 mg via INTRAVENOUS
  Filled 2024-09-04: qty 4

## 2024-09-04 MED ORDER — ACETAMINOPHEN 325 MG PO TABS
650.0000 mg | ORAL_TABLET | ORAL | Status: DC | PRN
  Administered 2024-09-05: 650 mg via ORAL
  Filled 2024-09-04: qty 2

## 2024-09-04 NOTE — Assessment & Plan Note (Signed)
 History of ureteral stent 03/2024 History of E. coli pyelonephritis secondary to obstructing ureteral stone No acute issues suspected

## 2024-09-04 NOTE — H&P (Signed)
 History and Physical    [name] FMW:[id number] DOB: 04-20-1951 DOA: 09/04/2024 DOS: the patient was seen and examined on 09/04/2024 PCP: Buren Rock HERO, MD  Patient coming from: Home  Chief Complaint:  Chief Complaint  Patient presents with   Chest Pain    HPI: Cynthia Lucas is a 73 y.o. female with medical history significant for A-fib, tremor, DM, HTN, OSA not on CPAP, CKD 3B, history of E. coli pyelonephritis/sepsis secondary to obstructing ureteral stone s/p stent (03/2024), being admitted with A-fib with RVR with possible new onset CHF. She presented with a 1 week history of intermittent chest pain.  She describes sharp shooting pains in her left chest that initially happen at night while she was lying down waking her from sleep, but is now happening intermittently throughout the day.  She has associated fatigue with the episodes .  Denies associated palpitations, nausea vomiting or diaphoresis.  On the day of arrival she noted swelling of her feet, but denies pain.  She denies cough, fever or chills.  Of note, she is intermittently compliant with her metoprolol , and states she has not taken her Eliquis  since her stent exchange in August. With EMS heart rate was 80-160.  They administered aspirin 324 en route. In the ED, afebrile, heart rate in the 120s, essentially unchanged after repeat doses of metoprolol .  Blood pressure remained stable in the 130s over 80s. Labs notable for normal CBC with baseline anemia (hemoglobin 11), troponin<15 and proBNP 3437.  Potassium and magnesium  normal, creatinine at baseline at 1.53 EKG showed A-fib at 114 Bilateral lower extremity venous Doppler negative for DVT, did show a right sided Baker's cyst CTA PE protocol negative for PE or other acute intrathoracic process.  Chest x-ray did show some vascular congestion and cardiomegaly  Patient was treated with metoprolol  25 mg p.o. x 2 and IV metoprolol  5 mg x 2 as well as with Lasix  40 mg IV x  1 Admission requested     Past Medical History:  Diagnosis Date   Atrial fibrillation (HCC)    Cough    CHRONIC   Diabetes mellitus without complication (HCC)    Dysrhythmia    GERD (gastroesophageal reflux disease)    History of kidney stones    Hypertension    Sleep apnea    no cpap   Tremor    HEAD   Past Surgical History:  Procedure Laterality Date   ABDOMINAL HYSTERECTOMY     CATARACT EXTRACTION W/PHACO Left 03/07/2018   Procedure: CATARACT EXTRACTION PHACO AND INTRAOCULAR LENS PLACEMENT (IOC);  Surgeon: Jaye Fallow, MD;  Location: [facility];  Service: Ophthalmology;  Laterality: Left;  Lot # [id number] H US : 04:19.8 AP%: 19.9 CDE: 51.70   CATARACT EXTRACTION W/PHACO Right 07/07/2020   Procedure: CATARACT EXTRACTION PHACO AND INTRAOCULAR LENS PLACEMENT (IOC) RIGHT DIABETIC 7.75 00:42.8;  Surgeon: Jaye Fallow, MD;  Location: [facility];  Service: Ophthalmology;  Laterality: Right;  diabetic - oral meds   CESAREAN SECTION     COLONOSCOPY     CYSTOSCOPY W/ RETROGRADES Right 01/07/2019   Procedure: CYSTOSCOPY WITH RETROGRADE PYELOGRAM;  Surgeon: Twylla Glendia BROCKS, MD;  Location: [facility];  Service: Urology;  Laterality: Right;   CYSTOSCOPY W/ RETROGRADES Right 05/24/2024   Procedure: CYSTOSCOPY, WITH RIGHT RETROGRADE PYELOGRAM;  Surgeon: Alvaro Ricardo KATHEE Mickey., MD;  Location: [facility];  Service: Urology;  Laterality: Right;   CYSTOSCOPY W/ URETERAL STENT PLACEMENT Right 12/28/2020   Procedure: CYSTOSCOPY WITH RETROGRADE PYELOGRAM/URETERAL STENT PLACEMENT;  Surgeon: Watt Rush, MD;  Location: [facility];  Service: Urology;  Laterality: Right;   CYSTOSCOPY W/ URETERAL STENT PLACEMENT Right 03/31/2024   Procedure: CYSTOSCOPY, WITH RETROGRADE PYELOGRAM AND URETERAL STENT INSERTION;  Surgeon: Alvaro Ricardo KATHEE Mickey., MD;  Location: [facility];  Service: Urology;  Laterality: Right;   CYSTOSCOPY WITH STENT PLACEMENT Right 01/07/2019   Procedure: CYSTOSCOPY WITH STENT  PLACEMENT;  Surgeon: Twylla Glendia BROCKS, MD;  Location: [facility];  Service: Urology;  Laterality: Right;   CYSTOSCOPY WITH URETEROSCOPY, STONE BASKETRY AND STENT PLACEMENT Right 01/29/2019   Procedure: CYSTOSCOPY/URETEROSCOPY/HOLMIUM LASER/STENT EXCHANGE;  Surgeon: Twylla Glendia BROCKS, MD;  Location: [facility];  Service: Urology;  Laterality: Right;   CYSTOSCOPY/URETEROSCOPY/HOLMIUM LASER/STENT PLACEMENT Right 02/05/2021   Procedure: CYSTOSCOPY/URETEROSCOPY/HOLMIUM LASER/STENT EXCHANGE;  Surgeon: Francisca Redell BROCKS, MD;  Location: [facility];  Service: Urology;  Laterality: Right;   CYSTOSCOPY/URETEROSCOPY/HOLMIUM LASER/STENT PLACEMENT Right 05/24/2024   Procedure: CYSTOSCOPY/DIAGNOSTIC URETEROSCOPY/ STENT REMOVAL;  Surgeon: Alvaro Ricardo KATHEE Mickey., MD;  Location: [facility];  Service: Urology;  Laterality: Right;  CYSTOSCOPY/DIAGNOSTIC URETEROSCOPY/STENT REMOVAL   ESOPHAGOGASTRODUODENOSCOPY N/A 01/02/2021   Procedure: ESOPHAGOGASTRODUODENOSCOPY (EGD);  Surgeon: Toledo, Ladell POUR, MD;  Location: [facility];  Service: Gastroenterology;  Laterality: N/A;   JOINT REPLACEMENT Left    knee   Social History:  reports that she has never smoked. She has never used smokeless tobacco. She reports current alcohol use. She reports that she does not use drugs.  Allergies  Allergen Reactions   Nickel Itching and Swelling   Other     Contact lense solution causes eye irritation    Family History  Problem Relation Age of Onset   Heart disease Mother    Breast cancer Neg Hx     Prior to Admission medications   Medication Sig Start Date End Date Taking? Authorizing Provider  apixaban  (ELIQUIS ) 5 MG TABS tablet Take 1 tablet (5 mg total) by mouth 2 (two) times daily. Blood thinner. 11/01/22 05/22/24  Furth, Cadence H, PA-C  atorvastatin  (LIPITOR) 40 MG tablet Take 1 tablet (40 mg total) by mouth at bedtime. Overdue follow visit.  PLEASE CALL OFFICE TO SCHEDULE APPOINTMENT PRIOR TO NEXT REFILL 08/25/23   Darron Deatrice LABOR, MD  erythromycin ophthalmic ointment Place 1 Application into the left eye 3 (three) times daily. 03/25/24   [provider]  hydrochlorothiazide  (HYDRODIURIL ) 12.5 MG tablet Take 12.5 mg by mouth daily. 12/02/23   [provider]  JARDIANCE  10 MG TABS tablet Take 10 mg by mouth daily.    [provider]  losartan (COZAAR) 25 MG tablet Take 25 mg by mouth daily. 05/21/24   [provider]  metoprolol  succinate (TOPROL -XL) 100 MG 24 hr tablet Take 100 mg by mouth daily.    [provider]  oxyCODONE  (OXY IR/ROXICODONE ) 5 MG immediate release tablet Take 1 tablet (5 mg total) by mouth every 6 (six) hours as needed for moderate pain (pain score 4-6) or severe pain (pain score 7-10) (post-operatively). 05/24/24   Alvaro Ricardo KATHEE Mickey., MD  TRADJENTA 5 MG TABS tablet Take 5 mg by mouth daily. 01/22/24   [provider]    Physical Exam: Vitals:   09/04/24 1923 09/04/24 1948 09/04/24 1950 09/04/24 2030  BP:  (!) 114/92 (!) 114/92 136/81  Pulse:  (!) 122 (!) 122 (!) 110  Resp:  (!) 22  16  Temp: 98.1 F (36.7 C)     TempSrc: Oral     SpO2:  95%  97%  Weight:  Height:       Physical Exam Vitals and nursing note reviewed.  Constitutional:      General: She is not in acute distress. HENT:     Head: Normocephalic and atraumatic.  Cardiovascular:     Rate and Rhythm: Tachycardia present. Rhythm irregular.     Heart sounds: Normal heart sounds.  Pulmonary:     Effort: Pulmonary effort is normal.     Breath sounds: Normal breath sounds.  Abdominal:     Palpations: Abdomen is soft.     Tenderness: There is no abdominal tenderness.  Musculoskeletal:     Right lower leg: No edema.     Left lower leg: No edema.  Neurological:     Mental Status: Mental status is at baseline.     Labs on Admission: I have personally reviewed following labs and imaging studies  CBC: Recent Labs  Lab 09/04/24 1451  WBC 8.3  HGB 11.0*  HCT 34.4*   MCV 82.1  PLT 293   Basic Metabolic Panel: Recent Labs  Lab 09/04/24 1451 09/04/24 1700  NA 142  --   K 4.0  --   CL 108  --   CO2 23  --   GLUCOSE 110*  --   BUN 27*  --   CREATININE 1.53*  --   CALCIUM  8.8*  --   MG  --  2.1   GFR: Estimated Creatinine Clearance: 38.4 mL/min (A) (by C-G formula based on SCr of 1.53 mg/dL (H)). Liver Function Tests: No results for input(s): AST, ALT, ALKPHOS, BILITOT, PROT, ALBUMIN in the last 168 hours. No results for input(s): LIPASE, AMYLASE in the last 168 hours. No results for input(s): AMMONIA in the last 168 hours. Coagulation Profile: No results for input(s): INR, PROTIME in the last 168 hours. Cardiac Enzymes: No results for input(s): CKTOTAL, CKMB, CKMBINDEX, TROPONINI in the last 168 hours. BNP (last 3 results) Recent Labs    09/04/24 1700  PROBNP 3,437.0*   HbA1C: No results for input(s): HGBA1C in the last 72 hours. CBG: No results for input(s): GLUCAP in the last 168 hours. Lipid Profile: No results for input(s): CHOL, HDL, LDLCALC, TRIG, CHOLHDL, LDLDIRECT in the last 72 hours. Thyroid Function Tests: No results for input(s): TSH, T4TOTAL, FREET4, T3FREE, THYROIDAB in the last 72 hours. Anemia Panel: No results for input(s): VITAMINB12, FOLATE, FERRITIN, TIBC, IRON , RETICCTPCT in the last 72 hours. Urine analysis:    Component Value Date/Time   COLORURINE AMBER (A) 03/31/2024 0626   APPEARANCEUR TURBID (A) 03/31/2024 0626   APPEARANCEUR Cloudy (A) 02/17/2021 1118   LABSPEC 1.024 03/31/2024 0626   PHURINE 5.0 03/31/2024 0626   GLUCOSEU >=500 (A) 03/31/2024 0626   HGBUR LARGE (A) 03/31/2024 0626   BILIRUBINUR NEGATIVE 03/31/2024 0626   BILIRUBINUR Negative 02/17/2021 1118   KETONESUR NEGATIVE 03/31/2024 0626   PROTEINUR 100 (A) 03/31/2024 0626   NITRITE NEGATIVE 03/31/2024 0626   LEUKOCYTESUR LARGE (A) 03/31/2024 0626    Radiological Exams  on Admission: CT Angio Chest PE W and/or Wo Contrast Result Date: 09/04/2024 CLINICAL DATA:  Intermittent chest pain x1 week EXAM: CT ANGIOGRAPHY CHEST WITH CONTRAST TECHNIQUE: Multidetector CT imaging of the chest was performed using the standard protocol during bolus administration of intravenous contrast. Multiplanar CT image reconstructions and MIPs were obtained to evaluate the vascular anatomy. RADIATION DOSE REDUCTION: This exam was performed according to the departmental dose-optimization program which includes automated exposure control, adjustment of the mA and/or kV according to patient size  and/or use of iterative reconstruction technique. CONTRAST:  60mL OMNIPAQUE  IOHEXOL  350 MG/ML SOLN COMPARISON:  None Available. FINDINGS: Cardiovascular: Satisfactory opacification of the pulmonary arteries to the segmental level. No evidence of pulmonary embolism. Normal heart size with moderate severity coronary artery calcification. No pericardial effusion. Mediastinum/Nodes: No enlarged mediastinal, hilar, or axillary lymph nodes. Thyroid gland, trachea, and esophagus demonstrate no significant findings. Lungs/Pleura: Mild areas of scarring and/or atelectasis are seen within the posterior left upper lobe and posteromedial right lung base. No acute infiltrate, pleural effusion or pneumothorax is identified. Upper Abdomen: There is moderate severity elevation of the right hemidiaphragm. Posterior and lateral gallbladder wall calcification is present. No acute intraabdominal abnormality is seen. Musculoskeletal: No chest wall abnormality. No acute or significant osseous findings. Review of the MIP images confirms the above findings. IMPRESSION: 1. No evidence of pulmonary embolism or other acute intrathoracic process. 2. Moderate severity coronary artery calcification. 3. Moderate severity elevation of the right hemidiaphragm. 4. Mild gallbladder wall calcification. Correlation with nonemergent right upper quadrant  ultrasound is recommended. Electronically Signed   By: Suzen Dials M.D.   On: 09/04/2024 19:16   US  Venous Img Lower Bilateral Result Date: 09/04/2024 CLINICAL DATA:  Bilateral lower extremity swelling EXAM: BILATERAL LOWER EXTREMITY VENOUS DOPPLER ULTRASOUND TECHNIQUE: Gray-scale sonography with graded compression, as well as color Doppler and duplex ultrasound were performed to evaluate the lower extremity deep venous systems from the level of the common femoral vein and including the common femoral, femoral, profunda femoral, popliteal and calf veins including the posterior tibial, peroneal and gastrocnemius veins when visible. The superficial great saphenous vein was also interrogated. Spectral Doppler was utilized to evaluate flow at rest and with distal augmentation maneuvers in the common femoral, femoral and popliteal veins. COMPARISON:  None Available. FINDINGS: RIGHT LOWER EXTREMITY Common Femoral Vein: No evidence of thrombus. Normal compressibility, respiratory phasicity and response to augmentation. Saphenofemoral Junction: No evidence of thrombus. Normal compressibility and flow on color Doppler imaging. Profunda Femoral Vein: No evidence of thrombus. Normal compressibility and flow on color Doppler imaging. Femoral Vein: No evidence of thrombus. Normal compressibility, respiratory phasicity and response to augmentation. Popliteal Vein: No evidence of thrombus. Normal compressibility, respiratory phasicity and response to augmentation. Calf Veins: No evidence of thrombus. Normal compressibility and flow on color Doppler imaging. Superficial Great Saphenous Vein: No evidence of thrombus. Normal compressibility. Venous Reflux:  None. Other Findings: Small fluid collection in the popliteal fossa measuring 4.8 x 1.1 x 3.0 cm consistent with a Baker's cyst. LEFT LOWER EXTREMITY Common Femoral Vein: No evidence of thrombus. Normal compressibility, respiratory phasicity and response to augmentation.  Saphenofemoral Junction: No evidence of thrombus. Normal compressibility and flow on color Doppler imaging. Profunda Femoral Vein: No evidence of thrombus. Normal compressibility and flow on color Doppler imaging. Femoral Vein: No evidence of thrombus. Normal compressibility, respiratory phasicity and response to augmentation. Popliteal Vein: No evidence of thrombus. Normal compressibility, respiratory phasicity and response to augmentation. Calf Veins: No evidence of thrombus. Normal compressibility and flow on color Doppler imaging. Superficial Great Saphenous Vein: No evidence of thrombus. Normal compressibility. Venous Reflux:  None. Other Findings:  None. IMPRESSION: No evidence of deep venous thrombosis in either lower extremity. Small right-sided Baker's cyst. Electronically Signed   By: Wilkie Lent M.D.   On: 09/04/2024 16:58   DG Chest 2 View Result Date: 09/04/2024 CLINICAL DATA:  Chest pain, atrial fibrillation EXAM: CHEST - 2 VIEW COMPARISON:  03/31/2024 FINDINGS: Similar cardiac enlargement with central vascular congestion  and bibasilar atelectasis. Slightly lower lung volumes with elevation of the right hemidiaphragm as before. No definite focal pneumonia, large effusion, or pneumothorax. Trachea midline. Degenerative changes of the spine. IMPRESSION: Cardiomegaly with vascular congestion and bibasilar atelectasis. Electronically Signed   By: CHRISTELLA.  Shick M.D.   On: 09/04/2024 15:46   Data Reviewed for HPI: Relevant notes from primary care and specialist visits, past discharge summaries as available in EHR, including Care Everywhere. Prior diagnostic testing as pertinent to current admission diagnoses Updated medications and problem lists for reconciliation ED course, including vitals, labs, imaging, treatment and response to treatment Triage notes, nursing and pharmacy notes and ED provider's notes Notable results as noted above in HPI      Assessment and Plan: * Atrial  fibrillation with rapid ventricular response (HCC) Heart rate in the 160s with EMS, in the 120s upon arrival Little improvement in rate with intermittent IV metoprolol  Given adherence to meds and heart rate at 110 on admission, will wait to see if home oral dose kicks in, otherwise might consider starting diltiazem  infusion Apixaban  resumed-compliance with apixaban  for primary stroke prevention emphasized  Chest pain History of low risk nuclear stress test 2022 Presents with 1 week of intermittent chest pain, mostly atypical  troponin<15 and EKG nonacute CTA chest negative for acute PE Monitor for improvement in chest pain with rate control Continue atorvastatin , apixaban , metoprolol  and losartan  Follow echocardiogram to evaluate for wall motion abnormality   Possible new onset CHF (congestive heart failure) (HCC) patient with proBNP 3437 and chest x-ray showing vascular congestion (CTA chest nonacute) Has fatigue/?shortness of breath and lower extremity edema (lower extremity venous Doppler and CTA chest negative for VTE) Echocardiogram from June 2025 showing EF 55 to 60% and indeterminate diastolic parameters Will get updated echo Daily weights with intake and output monitoring   Diabetes mellitus, type II (HCC) Sliding scale insulin  coverage  Hypertension BP controlled Continue home metoprolol  and losartan.  Will hold hydrochlorothiazide   History of kidney stones History of ureteral stent 03/2024 History of E. coli pyelonephritis secondary to obstructing ureteral stone No acute issues suspected  CKD stage 3b, GFR 30-44 ml/min (HCC) Renal function at baseline     DVT prophylaxis: apixaban   Consults: none  Advance Care Planning:   Code Status: Prior   Family Communication: none  Disposition Plan: Back to previous home environment  Severity of Illness: The appropriate patient status for this patient is OBSERVATION. Observation status is judged to be reasonable and  necessary in order to provide the required intensity of service to ensure the patient's safety. The patient's presenting symptoms, physical exam findings, and initial radiographic and laboratory data in the context of their medical condition is felt to place them at decreased risk for further clinical deterioration. Furthermore, it is anticipated that the patient will be medically stable for discharge from the hospital within 2 midnights of admission.   Author: Delayne LULLA Solian, MD 09/04/2024 10:17 PM  For on call review [email or web address].

## 2024-09-04 NOTE — Assessment & Plan Note (Signed)
 Sliding scale insulin  coverage

## 2024-09-04 NOTE — Assessment & Plan Note (Signed)
 -  Renal function  at baseline

## 2024-09-04 NOTE — Assessment & Plan Note (Signed)
 Heart rate in the 160s with EMS, in the 120s upon arrival Little improvement in rate with intermittent IV metoprolol  Given adherence to meds and heart rate at 110 on admission, will wait to see if home oral dose kicks in, otherwise might consider starting diltiazem  infusion Apixaban  resumed-compliance with apixaban  for primary stroke prevention emphasized

## 2024-09-04 NOTE — ED Notes (Signed)
 Answered call light, pt advised needs to use the bathroom, assisted to the toilet, advised to pull the call light if assistance is needed

## 2024-09-04 NOTE — ED Notes (Signed)
 CCMD called to place pt on monitor

## 2024-09-04 NOTE — ED Provider Notes (Signed)
 [facility] Provider Note    Event Date/Time   First MD Initiated Contact with Patient 09/04/24 1500     (approximate)   History   Chest Pain   HPI  Murdis Flitton is a 73 y.o. female who presents to the ED for evaluation of Chest Pain   Review of cardiology clinic visit from August. History of paroxysmal A-fib on Eliquis  and metoprolol , DM, HTN, obstructive uropathy.   Patient presents to the ED for evaluation of episodic chest pain over the past 10 days.  She reports noncompliance with Eliquis  for multiple months since ureteral stent exchange.  Reports occasionally taking her metoprolol .  Reports an episode of right calf pain and cramping atraumatically couple days ago   Physical Exam   Triage Vital Signs: ED Triage Vitals  Encounter Vitals Group     BP 09/04/24 1445 (!) 132/99     Girls Systolic BP Percentile --      Girls Diastolic BP Percentile --      Boys Systolic BP Percentile --      Boys Diastolic BP Percentile --      Pulse Rate 09/04/24 1443 (!) 125     Resp 09/04/24 1443 20     Temp 09/04/24 1443 97.8 F (36.6 C)     Temp Source 09/04/24 1443 Oral     SpO2 09/04/24 1443 97 %     Weight 09/04/24 1444 205 lb 4 oz (93.1 kg)     Height 09/04/24 1444 5' 7 (1.702 m)     Head Circumference --      Peak Flow --      Pain Score 09/04/24 1444 0     Pain Loc --      Pain Education --      Exclude from Growth Chart --     Most recent vital signs: Vitals:   09/04/24 1950 09/04/24 2030  BP: (!) 114/92 136/81  Pulse: (!) 122 (!) 110  Resp:  16  Temp:    SpO2:  97%    General: Awake, no distress.  CV:  Good peripheral perfusion.  Rapid A-fib normal Resp:  Normal effort.  Abd:  No distention.  Soft and nontender MSK:  No deformity noted.  Trace pitting edema to bilateral lower extremities Neuro:  No focal deficits appreciated. Other:     ED Results / Procedures / Treatments   Labs (all labs ordered are listed, but only  abnormal results are displayed) Labs Reviewed  BASIC METABOLIC PANEL WITH GFR - Abnormal; Notable for the following components:      Result Value   Glucose, Bld 110 (*)    BUN 27 (*)    Creatinine, Ser 1.53 (*)    Calcium  8.8 (*)    GFR, Estimated 36 (*)    All other components within normal limits  CBC - Abnormal; Notable for the following components:   Hemoglobin 11.0 (*)    HCT 34.4 (*)    RDW 16.5 (*)    All other components within normal limits  PRO BRAIN NATRIURETIC PEPTIDE - Abnormal; Notable for the following components:   Pro Brain Natriuretic Peptide 3,437.0 (*)    All other components within normal limits  MAGNESIUM   TROPONIN T, HIGH SENSITIVITY  TROPONIN T, HIGH SENSITIVITY    EKG Rapid A-fib with a rate of 114 bpm.  Normal axis and intervals.  No STEMI.  RADIOLOGY 2 view CXR interpreted by me with cardiomegaly and pulmonary vascular congestion CTA  chest without PE Ultrasounds of the legs without DVT  Official radiology report(s): CT Angio Chest PE W and/or Wo Contrast Result Date: 09/04/2024 CLINICAL DATA:  Intermittent chest pain x1 week EXAM: CT ANGIOGRAPHY CHEST WITH CONTRAST TECHNIQUE: Multidetector CT imaging of the chest was performed using the standard protocol during bolus administration of intravenous contrast. Multiplanar CT image reconstructions and MIPs were obtained to evaluate the vascular anatomy. RADIATION DOSE REDUCTION: This exam was performed according to the departmental dose-optimization program which includes automated exposure control, adjustment of the mA and/or kV according to patient size and/or use of iterative reconstruction technique. CONTRAST:  60mL OMNIPAQUE  IOHEXOL  350 MG/ML SOLN COMPARISON:  None Available. FINDINGS: Cardiovascular: Satisfactory opacification of the pulmonary arteries to the segmental level. No evidence of pulmonary embolism. Normal heart size with moderate severity coronary artery calcification. No pericardial effusion.  Mediastinum/Nodes: No enlarged mediastinal, hilar, or axillary lymph nodes. Thyroid gland, trachea, and esophagus demonstrate no significant findings. Lungs/Pleura: Mild areas of scarring and/or atelectasis are seen within the posterior left upper lobe and posteromedial right lung base. No acute infiltrate, pleural effusion or pneumothorax is identified. Upper Abdomen: There is moderate severity elevation of the right hemidiaphragm. Posterior and lateral gallbladder wall calcification is present. No acute intraabdominal abnormality is seen. Musculoskeletal: No chest wall abnormality. No acute or significant osseous findings. Review of the MIP images confirms the above findings. IMPRESSION: 1. No evidence of pulmonary embolism or other acute intrathoracic process. 2. Moderate severity coronary artery calcification. 3. Moderate severity elevation of the right hemidiaphragm. 4. Mild gallbladder wall calcification. Correlation with nonemergent right upper quadrant ultrasound is recommended. Electronically Signed   By: Suzen Dials M.D.   On: 09/04/2024 19:16   US  Venous Img Lower Bilateral Result Date: 09/04/2024 CLINICAL DATA:  Bilateral lower extremity swelling EXAM: BILATERAL LOWER EXTREMITY VENOUS DOPPLER ULTRASOUND TECHNIQUE: Gray-scale sonography with graded compression, as well as color Doppler and duplex ultrasound were performed to evaluate the lower extremity deep venous systems from the level of the common femoral vein and including the common femoral, femoral, profunda femoral, popliteal and calf veins including the posterior tibial, peroneal and gastrocnemius veins when visible. The superficial great saphenous vein was also interrogated. Spectral Doppler was utilized to evaluate flow at rest and with distal augmentation maneuvers in the common femoral, femoral and popliteal veins. COMPARISON:  None Available. FINDINGS: RIGHT LOWER EXTREMITY Common Femoral Vein: No evidence of thrombus. Normal  compressibility, respiratory phasicity and response to augmentation. Saphenofemoral Junction: No evidence of thrombus. Normal compressibility and flow on color Doppler imaging. Profunda Femoral Vein: No evidence of thrombus. Normal compressibility and flow on color Doppler imaging. Femoral Vein: No evidence of thrombus. Normal compressibility, respiratory phasicity and response to augmentation. Popliteal Vein: No evidence of thrombus. Normal compressibility, respiratory phasicity and response to augmentation. Calf Veins: No evidence of thrombus. Normal compressibility and flow on color Doppler imaging. Superficial Great Saphenous Vein: No evidence of thrombus. Normal compressibility. Venous Reflux:  None. Other Findings: Small fluid collection in the popliteal fossa measuring 4.8 x 1.1 x 3.0 cm consistent with a Baker's cyst. LEFT LOWER EXTREMITY Common Femoral Vein: No evidence of thrombus. Normal compressibility, respiratory phasicity and response to augmentation. Saphenofemoral Junction: No evidence of thrombus. Normal compressibility and flow on color Doppler imaging. Profunda Femoral Vein: No evidence of thrombus. Normal compressibility and flow on color Doppler imaging. Femoral Vein: No evidence of thrombus. Normal compressibility, respiratory phasicity and response to augmentation. Popliteal Vein: No evidence of thrombus. Normal compressibility, respiratory  phasicity and response to augmentation. Calf Veins: No evidence of thrombus. Normal compressibility and flow on color Doppler imaging. Superficial Great Saphenous Vein: No evidence of thrombus. Normal compressibility. Venous Reflux:  None. Other Findings:  None. IMPRESSION: No evidence of deep venous thrombosis in either lower extremity. Small right-sided Baker's cyst. Electronically Signed   By: Wilkie Lent M.D.   On: 09/04/2024 16:58   DG Chest 2 View Result Date: 09/04/2024 CLINICAL DATA:  Chest pain, atrial fibrillation EXAM: CHEST - 2 VIEW  COMPARISON:  03/31/2024 FINDINGS: Similar cardiac enlargement with central vascular congestion and bibasilar atelectasis. Slightly lower lung volumes with elevation of the right hemidiaphragm as before. No definite focal pneumonia, large effusion, or pneumothorax. Trachea midline. Degenerative changes of the spine. IMPRESSION: Cardiomegaly with vascular congestion and bibasilar atelectasis. Electronically Signed   By: CHRISTELLA.  Shick M.D.   On: 09/04/2024 15:46    PROCEDURES and INTERVENTIONS:  .Critical Care  Performed by: Claudene Rover, MD Authorized by: Claudene Rover, MD   Critical care provider statement:    Critical care time (minutes):  30   Critical care time was exclusive of:  Separately billable procedures and treating other patients   Critical care was necessary to treat or prevent imminent or life-threatening deterioration of the following conditions:  Cardiac failure and circulatory failure   Critical care was time spent personally by me on the following activities:  Development of treatment plan with patient or surrogate, discussions with consultants, evaluation of patient's response to treatment, examination of patient, ordering and review of laboratory studies, ordering and review of radiographic studies, ordering and performing treatments and interventions, pulse oximetry, re-evaluation of patient's condition and review of old charts .1-3 Lead EKG Interpretation  Performed by: Claudene Rover, MD Authorized by: Claudene Rover, MD     Interpretation: abnormal     ECG rate:  141   ECG rate assessment: tachycardic     Rhythm: atrial fibrillation     Ectopy: none     Conduction: normal     Medications  metoprolol  tartrate (LOPRESSOR ) injection 5 mg (5 mg Intravenous Given 09/04/24 1531)  metoprolol  tartrate (LOPRESSOR ) tablet 25 mg (25 mg Oral Given 09/04/24 1531)  iohexol  (OMNIPAQUE ) 350 MG/ML injection 60 mL (60 mLs Intravenous Contrast Given 09/04/24 1746)  furosemide  (LASIX ) injection  40 mg (40 mg Intravenous Given 09/04/24 1950)  metoprolol  tartrate (LOPRESSOR ) injection 5 mg (5 mg Intravenous Given 09/04/24 1950)  metoprolol  tartrate (LOPRESSOR ) tablet 25 mg (25 mg Oral Given 09/04/24 1950)     IMPRESSION / MDM / ASSESSMENT AND PLAN / ED COURSE  I reviewed the triage vital signs and the nursing notes.  Differential diagnosis includes, but is not limited to, ACS, PTX, PNA, muscle strain/spasm, PE, dissection, anxiety, pleural effusion  {Patient presents with symptoms of an acute illness or injury that is potentially life-threatening.  Patient presents to the ED with chest discomfort in the setting of medication noncompliance and rapid A-fib.  Some trace pitting edema and appears somewhat volume overloaded with congested x-ray and persistent A-fib.  Hemodynamically stable.  Troponins are low, elevated BNP without comparison.  Renal dysfunction near baseline.  CBC without acute features.  Provide oral and IV metoprolol  twice, IV Lasix  and she remains in rapid A-fib.  Considering her medication noncompliance I suspect she has been A-fib for significant mount of time and likely has significant pulmonary congestion and needs additional diuresis before she could convert.  Will consult medicine for admission.  Clinical Course as of 09/04/24  2129  Wed Sep 04, 2024  1946 Reassessed, feeling okay, playing puzzle game on her phone.  Discussed plan of care.  Remains in rapid A-fib with heart rates in the 100-110s. [DS]    Clinical Course User Index [DS] Claudene Rover, MD     FINAL CLINICAL IMPRESSION(S) / ED DIAGNOSES   Final diagnoses:  Other chest pain  Atrial fibrillation with RVR (HCC)  Noncompliance with medication regimen     Rx / DC Orders   ED Discharge Orders     None        Note:  This document was prepared using Dragon voice recognition software and may include unintentional dictation errors.   Claudene Rover, MD 09/04/24 2129

## 2024-09-04 NOTE — Assessment & Plan Note (Addendum)
 History of low risk nuclear stress test 2022 Presents with 1 week of intermittent chest pain, suspecting related to rapid A-fib Troponin<15 and EKG nonacute CTA chest negative for acute PE Monitor for improvement in chest pain with rate control Continue atorvastatin , apixaban , metoprolol  and losartan  Follow echocardiogram to evaluate for wall motion abnormality

## 2024-09-04 NOTE — Assessment & Plan Note (Signed)
 BP controlled Continue home metoprolol  and losartan.  Will hold hydrochlorothiazide 

## 2024-09-04 NOTE — ED Triage Notes (Signed)
 Pt to ED via [facility] from home for intermittent chest pain x1 week. EMS report uncontrolled afib from 80-160 bpm. Pt has hx of afib. Pt has been compliant with metoprolol . Pt denies chest pain ans Sob. Pt reports she was supposed to be taking anticoagulation but has not taken it in months. EMS gave 324 ASA.  EMS vitals SPO2 100% RA  BP 137/84 CBG 161

## 2024-09-04 NOTE — Assessment & Plan Note (Addendum)
 patient with proBNP 3437 and chest x-ray showing vascular congestion (CTA chest nonacute) Has shortness of breath and lower extremity edema (lower extremity venous Doppler and CTA chest negative for VTE) Echocardiogram from June 2025 showing EF 55 to 60% and indeterminate diastolic parameters Will get updated echo Daily weights with intake and output monitoring

## 2024-09-05 ENCOUNTER — Observation Stay: Admit: 2024-09-05 | Discharge: 2024-09-05 | Disposition: A | Attending: Internal Medicine

## 2024-09-05 DIAGNOSIS — I5031 Acute diastolic (congestive) heart failure: Secondary | ICD-10-CM | POA: Diagnosis not present

## 2024-09-05 DIAGNOSIS — I4891 Unspecified atrial fibrillation: Secondary | ICD-10-CM | POA: Diagnosis present

## 2024-09-05 LAB — ECHOCARDIOGRAM COMPLETE
AR max vel: 1.8 cm2
AV Area VTI: 2.07 cm2
AV Area mean vel: 1.85 cm2
AV Mean grad: 3 mmHg
AV Peak grad: 4.5 mmHg
Ao pk vel: 1.06 m/s
Area-P 1/2: 5.13 cm2
Calc EF: 48.3 %
Height: 67 in
MV M vel: 4.54 m/s
MV Peak grad: 82.4 mmHg
MV VTI: 3.29 cm2
P 1/2 time: 476 ms
S' Lateral: 4 cm
Single Plane A2C EF: 53.2 %
Single Plane A4C EF: 48.9 %
Weight: 3283.97 [oz_av]

## 2024-09-05 LAB — BASIC METABOLIC PANEL WITH GFR
Anion gap: 12 (ref 5–15)
BUN: 28 mg/dL — ABNORMAL HIGH (ref 8–23)
CO2: 25 mmol/L (ref 22–32)
Calcium: 8.9 mg/dL (ref 8.9–10.3)
Chloride: 106 mmol/L (ref 98–111)
Creatinine, Ser: 1.5 mg/dL — ABNORMAL HIGH (ref 0.44–1.00)
GFR, Estimated: 36 mL/min — ABNORMAL LOW (ref >60)
Glucose, Bld: 102 mg/dL — ABNORMAL HIGH (ref 70–99)
Potassium: 3.4 mmol/L — ABNORMAL LOW (ref 3.5–5.1)
Sodium: 143 mmol/L (ref 135–145)

## 2024-09-05 MED ORDER — POTASSIUM CHLORIDE CRYS ER 20 MEQ PO TBCR
40.0000 meq | EXTENDED_RELEASE_TABLET | Freq: Once | ORAL | Status: AC
  Administered 2024-09-05: 40 meq via ORAL
  Filled 2024-09-05: qty 2

## 2024-09-05 NOTE — Progress Notes (Signed)
 Progress Note   [name] FMW:[id number] DOB: 06/19/1951 DOA: 09/04/2024     0 DOS: the patient was seen and examined on 09/05/2024   Brief hospital course: From HPI Cynthia Lucas is a 73 y.o. female with medical history significant for A-fib, tremor, DM, HTN, OSA not on CPAP, CKD 3B, history of E. coli pyelonephritis/sepsis secondary to obstructing ureteral stone s/p stent (03/2024), being admitted with A-fib with RVR with possible new onset CHF. She presented with a 1 week history of intermittent chest pain.  She describes sharp shooting pains in her left chest that initially happen at night while she was lying down waking her from sleep, but is now happening intermittently throughout the day.  She has associated fatigue with the episodes .  Denies associated palpitations, nausea vomiting or diaphoresis.  On the day of arrival she noted swelling of her feet, but denies pain.  She denies cough, fever or chills.  Of note, she is intermittently compliant with her metoprolol , and states she has not taken her Eliquis  since her stent exchange in August. With EMS heart rate was 80-160.  They administered aspirin 324 en route. In the ED, afebrile, heart rate in the 120s, essentially unchanged after repeat doses of metoprolol .  Blood pressure remained stable in the 130s over 80s. Labs notable for normal CBC with baseline anemia (hemoglobin 11), troponin<15 and proBNP 3437.  Potassium and magnesium  normal, creatinine at baseline at 1.53 EKG showed A-fib at 114 Bilateral lower extremity venous Doppler negative for DVT, did show a right sided Baker's cyst CTA PE protocol negative for PE or other acute intrathoracic process.  Chest x-ray did show some vascular congestion and cardiomegaly   Patient was treated with metoprolol  25 mg p.o. x 2 and IV metoprolol  5 mg x 2 as well as with Lasix  40 mg IV x 1 Admission requested       Assessment and Plan:  Atrial fibrillation with rapid ventricular  response (HCC) Heart rate in the 160s with EMS, in the 120s upon arrival Little improvement in rate with intermittent IV metoprolol  Given adherence to meds and heart rate at 110 on admission, will wait to see if home oral dose kicks in, otherwise might consider starting diltiazem  infusion Apixaban  resumed-compliance with apixaban  for primary stroke prevention emphasized   Chest pain-resolved History of low risk nuclear stress test 2022 Presents with 1 week of intermittent chest pain, mostly atypical  troponin<15 and EKG nonacute CTA chest negative for acute PE Monitor for improvement in chest pain with rate control Continue atorvastatin , apixaban , metoprolol  and losartan  Follow echocardiogram to evaluate for wall motion abnormality     Possible new onset CHF (congestive heart failure) (HCC) patient with proBNP 3437 and chest x-ray showing vascular congestion (CTA chest nonacute) Has fatigue/?shortness of breath and lower extremity edema (lower extremity venous Doppler and CTA chest negative for VTE) Echocardiogram from June 2025 showing EF 55 to 60% and indeterminate diastolic parameters Follow-up on echocardiogram Continue to monitor Daily weights with intake and output      Diabetes mellitus, type II (HCC) Sliding scale insulin  coverage   Hypertension BP controlled Continue home metoprolol  and losartan.  Will hold hydrochlorothiazide    History of kidney stones History of ureteral stent 03/2024 History of E. coli pyelonephritis secondary to obstructing ureteral stone No acute issues suspected   CKD stage 3b, GFR 30-44 ml/min (HCC) Renal function at baseline   DVT prophylaxis: apixaban    Consults: none   Advance Care Planning:  Code Status: Prior    Family Communication: none   Subjective:  Patient seen and examined at bedside this morning Admits to some improvement overnight Denies nausea vomiting or chest pain  Physical Exam:  Vitals and nursing note  reviewed.  Constitutional:      General: She is not in acute distress. HENT:     Head: Normocephalic and atraumatic.  Cardiovascular:     Rate and Rhythm: Tachycardia present. Rhythm irregular.     Heart sounds: Normal heart sounds.  Pulmonary:     Effort: Pulmonary effort is normal.     Breath sounds: Normal breath sounds.  Abdominal:     Palpations: Abdomen is soft.     Tenderness: There is no abdominal tenderness.  Musculoskeletal:     Right lower leg: No edema.     Left lower leg: No edema.  Neurological:     Mental Status: Mental status is at baseline.  Vitals:   09/05/24 0848 09/05/24 0945 09/05/24 1000 09/05/24 1344  BP:  111/81 114/79 103/77  Pulse:  (!) 129 94 83  Resp:  (!) 27 17   Temp: 98.1 F (36.7 C)     TempSrc: Oral     SpO2:  100% 100% 97%  Weight:      Height:        Data Reviewed:  Chest x-ray reviewed showing cardiomegaly and some vascular congestion     Latest Ref Rng & Units 09/05/2024    5:11 AM 09/04/2024    2:51 PM 05/24/2024    1:16 PM  BMP  Glucose 70 - 99 mg/dL 897  889  883   BUN 8 - 23 mg/dL 28  27  20    Creatinine 0.44 - 1.00 mg/dL 8.49  8.46  8.41   Sodium 135 - 145 mmol/L 143  142  141   Potassium 3.5 - 5.1 mmol/L 3.4  4.0  3.6   Chloride 98 - 111 mmol/L 106  108  108   CO2 22 - 32 mmol/L 25  23  23    Calcium  8.9 - 10.3 mg/dL 8.9  8.8  9.1      Author: Drue ONEIDA Potter, MD 09/05/2024 4:32 PM  For on call review [email or web address].

## 2024-09-05 NOTE — Plan of Care (Signed)
  Problem: Clinical Measurements: Goal: Ability to maintain clinical measurements within normal limits will improve Outcome: Progressing   Problem: Clinical Measurements: Goal: Cardiovascular complication will be avoided Outcome: Progressing   Problem: Activity: Goal: Risk for activity intolerance will decrease Outcome: Progressing   Problem: Pain Managment: Goal: General experience of comfort will improve and/or be controlled Outcome: Progressing   Problem: Safety: Goal: Ability to remain free from injury will improve Outcome: Progressing

## 2024-09-05 NOTE — Plan of Care (Signed)
  Problem: Activity: Goal: Ability to tolerate increased activity will improve Outcome: Progressing   Problem: Cardiac: Goal: Ability to achieve and maintain adequate cardiovascular perfusion will improve Outcome: Progressing   Problem: Health Behavior/Discharge Planning: Goal: Ability to safely manage health-related needs after discharge will improve Outcome: Progressing   

## 2024-09-05 NOTE — ED Notes (Signed)
 NURSE ASHLEY RN INFORMED OF BED ASSIGNED

## 2024-09-06 ENCOUNTER — Encounter: Payer: Self-pay | Admitting: Internal Medicine

## 2024-09-06 DIAGNOSIS — I4891 Unspecified atrial fibrillation: Secondary | ICD-10-CM | POA: Diagnosis not present

## 2024-09-06 LAB — CBC WITH DIFFERENTIAL/PLATELET
Abs Immature Granulocytes: 0.02 K/uL (ref 0.00–0.07)
Basophils Absolute: 0 K/uL (ref 0.0–0.1)
Basophils Relative: 1 %
Eosinophils Absolute: 0.2 K/uL (ref 0.0–0.5)
Eosinophils Relative: 3 %
HCT: 36 % (ref 36.0–46.0)
Hemoglobin: 11.3 g/dL — ABNORMAL LOW (ref 12.0–15.0)
Immature Granulocytes: 0 %
Lymphocytes Relative: 40 %
Lymphs Abs: 2.9 K/uL (ref 0.7–4.0)
MCH: 25.9 pg — ABNORMAL LOW (ref 26.0–34.0)
MCHC: 31.4 g/dL (ref 30.0–36.0)
MCV: 82.6 fL (ref 80.0–100.0)
Monocytes Absolute: 0.6 K/uL (ref 0.1–1.0)
Monocytes Relative: 8 %
Neutro Abs: 3.6 K/uL (ref 1.7–7.7)
Neutrophils Relative %: 48 %
Platelets: 295 K/uL (ref 150–400)
RBC: 4.36 MIL/uL (ref 3.87–5.11)
RDW: 16 % — ABNORMAL HIGH (ref 11.5–15.5)
WBC: 7.3 K/uL (ref 4.0–10.5)
nRBC: 0 % (ref 0.0–0.2)

## 2024-09-06 LAB — BASIC METABOLIC PANEL WITH GFR
Anion gap: 9 (ref 5–15)
BUN: 33 mg/dL — ABNORMAL HIGH (ref 8–23)
CO2: 28 mmol/L (ref 22–32)
Calcium: 9.2 mg/dL (ref 8.9–10.3)
Chloride: 106 mmol/L (ref 98–111)
Creatinine, Ser: 1.71 mg/dL — ABNORMAL HIGH (ref 0.44–1.00)
GFR, Estimated: 31 mL/min — ABNORMAL LOW (ref >60)
Glucose, Bld: 104 mg/dL — ABNORMAL HIGH (ref 70–99)
Potassium: 4 mmol/L (ref 3.5–5.1)
Sodium: 143 mmol/L (ref 135–145)

## 2024-09-06 LAB — LIPOPROTEIN A (LPA): Lipoprotein (a): 71.7 nmol/L — ABNORMAL HIGH (ref <75.0)

## 2024-09-06 MED ORDER — POLYETHYLENE GLYCOL 3350 17 G PO PACK
17.0000 g | PACK | Freq: Every day | ORAL | Status: DC
  Administered 2024-09-06: 17 g via ORAL
  Filled 2024-09-06 (×2): qty 1

## 2024-09-06 NOTE — TOC CM/SW Note (Signed)
 Transition of Care [facility]) - Inpatient Brief Assessment   Patient Details  Name: Cynthia Lucas MRN: [id number] Date of Birth: Aug 01, 1951  Transition of Care [facility]) CM/SW Contact:    Lauraine JAYSON Carpen, LCSW Phone Number: 09/06/2024, 3:39 PM   Clinical Narrative: CSW reviewed chart. No TOC needs identified so far. CSW will continue to follow progress. Please place [facility] consult if any needs arise.  Transition of Care Asessment: Insurance and Status: Insurance coverage has been reviewed Patient has primary care physician: Yes Home environment has been reviewed: Single family home Prior level of function:: Not documented Prior/Current Home Services: No current home services Social Drivers of Health Review: SDOH reviewed no interventions necessary Readmission risk has been reviewed: Yes Transition of care needs: no transition of care needs at this time

## 2024-09-06 NOTE — Progress Notes (Signed)
 Progress Note   [name] FMW:[id number] DOB: 07-02-1951 DOA: 09/04/2024     1 DOS: the patient was seen and examined on 09/06/2024     Brief hospital course: From HPI Johnasia Liese is a 73 y.o. female with medical history significant for A-fib, tremor, DM, HTN, OSA not on CPAP, CKD 3B, history of E. coli pyelonephritis/sepsis secondary to obstructing ureteral stone s/p stent (03/2024), being admitted with A-fib with RVR with possible new onset CHF. She presented with a 1 week history of intermittent chest pain.  She describes sharp shooting pains in her left chest that initially happen at night while she was lying down waking her from sleep, but is now happening intermittently throughout the day.  She has associated fatigue with the episodes .  Denies associated palpitations, nausea vomiting or diaphoresis.  On the day of arrival she noted swelling of her feet, but denies pain.  She denies cough, fever or chills.  Of note, she is intermittently compliant with her metoprolol , and states she has not taken her Eliquis  since her stent exchange in August. With EMS heart rate was 80-160.  They administered aspirin 324 en route. In the ED, afebrile, heart rate in the 120s, essentially unchanged after repeat doses of metoprolol .  Blood pressure remained stable in the 130s over 80s. Labs notable for normal CBC with baseline anemia (hemoglobin 11), troponin<15 and proBNP 3437.  Potassium and magnesium  normal, creatinine at baseline at 1.53 EKG showed A-fib at 114 Bilateral lower extremity venous Doppler negative for DVT, did show a right sided Baker's cyst CTA PE protocol negative for PE or other acute intrathoracic process.  Chest x-ray did show some vascular congestion and cardiomegaly   Patient was treated with metoprolol  25 mg p.o. x 2 and IV metoprolol  5 mg x 2 as well as with Lasix  40 mg IV x 1 Admission requested        Assessment and Plan:   Atrial fibrillation with rapid  ventricular response (HCC) Heart rate in the 160s with EMS, in the 120s upon arrival Little improvement in rate with intermittent IV metoprolol  Given adherence to meds and heart rate at 110 on admission, will wait to see if home oral dose kicks in, otherwise might consider starting diltiazem  infusion Apixaban  resumed-compliance with apixaban  for primary stroke prevention emphasized   Chest pain-resolved History of low risk nuclear stress test 2022 Presents with 1 week of intermittent chest pain, mostly atypical  troponin<15 and EKG nonacute CTA chest negative for acute PE Monitor for improvement in chest pain with rate control Continue atorvastatin , apixaban , metoprolol  and losartan   Follow echocardiogram to evaluate for wall motion abnormality     HFrEF (congestive heart failure) (HCC) patient with proBNP 3437 and chest x-ray showing vascular congestion (CTA chest nonacute) Has fatigue/?shortness of breath and lower extremity edema (lower extremity venous Doppler and CTA chest negative for VTE) Echocardiogram from June 2025 showing EF 55 to 60% and indeterminate diastolic parameters Continue to monitor Daily weights with intake and output  Echocardiogram showed EF 40 to 45%   Diabetes mellitus, type II (HCC) Sliding scale insulin  coverage   Hypertension BP controlled Continue home metoprolol  and losartan .  Will hold hydrochlorothiazide    History of kidney stones History of ureteral stent 03/2024 History of E. 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  Constitutional:      General: She is not in acute distress. HENT:     Head: Normocephalic and atraumatic.  Cardiovascular:     Rate and Rhythm: Tachycardia present. Rhythm irregular.     Heart sounds: Normal heart sounds.  Pulmonary:     Effort: Pulmonary effort is normal.     Breath sounds: Normal breath sounds.  Abdominal:     Palpations: Abdomen is soft.     Tenderness: There is no abdominal tenderness.  Musculoskeletal:     Right lower leg: No edema.     Left lower leg: No edema.  Neurological:     Mental Status: Mental status is at baseline.    Vitals:   09/06/24 0511 09/06/24 0748 09/06/24 1145 09/06/24 1646  BP:  109/77 107/79 95/65  Pulse:  86 (!) 110 (!) 101  Resp:      Temp:  97.9 F (36.6 C) (!) 97.2 F (36.2 C) 97.8 F (36.6 C)  TempSrc:    Oral  SpO2:  95% 93% 98%  Weight: 82.9 kg     Height:        Data Reviewed:    Latest Ref Rng & Units 09/06/2024    4:09 AM 09/04/2024    2:51 PM 05/24/2024    1:16 PM  CBC  WBC 4.0 - 10.5 K/uL 7.3  8.3  6.3   Hemoglobin 12.0 - 15.0 g/dL 88.6  88.9  88.5   Hematocrit 36.0 - 46.0 % 36.0  34.4  37.9   Platelets 150 - 400 K/uL 295  293  365        Latest Ref Rng & Units 09/06/2024    4:09 AM 09/05/2024    5:11 AM 09/04/2024    2:51 PM  BMP  Glucose 70 - 99 mg/dL 895  897  889   BUN 8 - 23 mg/dL 33  28  27   Creatinine 0.44 - 1.00 mg/dL 8.28  8.49  8.46   Sodium 135 - 145 mmol/L 143  143  142   Potassium 3.5 - 5.1 mmol/L 4.0  3.4  4.0   Chloride 98 - 111 mmol/L 106  106  108   CO2 22 - 32 mmol/L 28  25  23    Calcium  8.9 - 10.3 mg/dL 9.2  8.9  8.8      Author: Drue ONEIDA Potter, MD 09/06/2024 5:01 PM  For on call review [email or web address].

## 2024-09-06 NOTE — Plan of Care (Signed)
   Problem: Activity: Goal: Ability to tolerate increased activity will improve Outcome: Progressing   Problem: Cardiac: Goal: Ability to achieve and maintain adequate cardiovascular perfusion will improve Outcome: Progressing

## 2024-09-06 NOTE — Progress Notes (Signed)
 Heart Failure Nurse Navigator Progress Note  PCP: [place] HERO, MD PCP-Cardiologist: Deatrice Cage, MD Admission Diagnosis: Atrial fibrillation with RVR [facility]) Noncompliance with medication regimen Admitted from: Home via EMS  Presentation:   Cynthia Lucas is a 73 y.o. female. She presented with intermittent chest over the past 10 days. EMS reported uncontrolled atrial fibrillation 90-160 bpm.  Patient does have a history of a-fib and has occasionally been taking metoprolol  but not Eliquis  in months since ureteral stent exchange. Also a history of DM, HTN, and obstructive uropathy. Pro [id number]. HS-Troponin <15. Chest x-ray- Cardiomegaly with vascular congestion and bibasilar atelectasis. CT of chest No PE , Moderate severity of coronary artery calcification. Moderate severity elevation of right hemidiaphragm, mild gallbladder wall calcification.    ECHO/ LVEF: 40-45%  Clinical Course:  Past Medical History:  Diagnosis Date   Atrial fibrillation (HCC)    Cough    CHRONIC   Diabetes mellitus without complication (HCC)    Dysrhythmia    GERD (gastroesophageal reflux disease)    History of kidney stones    Hypertension    Sleep apnea    no cpap   Tremor    HEAD     Social History   Socioeconomic History   Marital status: Divorced    Spouse name: Not on file   Number of children: 2   Years of education: Not on file   Highest education level: 12th grade  Occupational History   Not on file  Tobacco Use   Smoking status: Never   Smokeless tobacco: Never  Vaping Use   Vaping status: Never Used  Substance and Sexual Activity   Alcohol use: Yes    Comment: OCCAS   Drug use: Never   Sexual activity: Yes    Birth control/protection: None  Other Topics Concern   Not on file  Social History Narrative   Not on file   Social Drivers of Health   Financial Resource Strain: Low Risk  (09/06/2024)   Overall Financial Resource Strain (CARDIA)    Difficulty of Paying Living  Expenses: Not very hard  Food Insecurity: No Food Insecurity (09/06/2024)   Hunger Vital Sign    Worried About Running Out of Food in the Last Year: Never true    Ran Out of Food in the Last Year: Never true  Transportation Needs: Patient Declined (03/31/2024)   PRAPARE - Administrator, Civil Service (Medical): Patient declined    Lack of Transportation (Non-Medical): Patient declined  Physical Activity: Not on file  Stress: Not on file  Social Connections: Patient Declined (03/31/2024)   Social Connection and Isolation Panel    Frequency of Communication with Friends and Family: Patient declined    Frequency of Social Gatherings with Friends and Family: Patient declined    Attends Religious Services: Patient declined    Database Administrator or Organizations: Patient declined    Attends Banker Meetings: Patient declined    Marital Status: Patient declined   Education Assessment and Provision:  Detailed education and instructions provided on heart failure disease management including the following:  Signs and symptoms of Heart Failure When to call the physician Importance of daily weights Low sodium diet Fluid restriction Medication management Anticipated future follow-up appointments  Patient education given on each of the above topics.  Patient acknowledges understanding via teach back method and acceptance of all instructions.  Education Materials:  Living Better With Heart Failure Booklet, HF zone tool, & Daily Weight Tracker  Tool.  Patient has scale at home: No-HF Navigator to provide her with a scale and pill box. Patient has pill box at home: No    High Risk Criteria for Readmission and/or Poor Patient Outcomes: Heart failure hospital admissions (last 6 months): 1  No Show rate: 19% Difficult social situation: No immediate family in the area. Misses her son that passed away 1 year ago. Other 2 sons live in [place] . Brother lives in [place] .  Patient lives in a home with landlord Mikle). Her friend from church Martene) drives her to appointments.  Demonstrates medication adherence: No Primary Language: English Literacy level: Reading, Writing & Comprehension  Barriers of Care:   Daily Weights Diet & Fluid Restrictions Medication Compliance  Considerations/Referrals:  Referral made to Heart Failure Pharmacist Stewardship: N/A Referral made to Heart Failure CSW/NCM TOC: No Referral made to Heart & Vascular TOC clinic: Yes. [facility] 09/24/24 @ 9:30 AM  Items for Follow-up on DC/TOC: Daily Weights Diet & Fluid Restrictions Medication Compliance Continued Heart Failure Education  Charmaine Pines, RN, BSN [facility] Heart Failure Navigator Secure Chat Only

## 2024-09-07 DIAGNOSIS — I4891 Unspecified atrial fibrillation: Secondary | ICD-10-CM | POA: Diagnosis not present

## 2024-09-07 LAB — CBC WITH DIFFERENTIAL/PLATELET
Abs Immature Granulocytes: 0.02 K/uL (ref 0.00–0.07)
Basophils Absolute: 0 K/uL (ref 0.0–0.1)
Basophils Relative: 1 %
Eosinophils Absolute: 0.2 K/uL (ref 0.0–0.5)
Eosinophils Relative: 3 %
HCT: 37.7 % (ref 36.0–46.0)
Hemoglobin: 11.8 g/dL — ABNORMAL LOW (ref 12.0–15.0)
Immature Granulocytes: 0 %
Lymphocytes Relative: 37 %
Lymphs Abs: 2.7 K/uL (ref 0.7–4.0)
MCH: 25.4 pg — ABNORMAL LOW (ref 26.0–34.0)
MCHC: 31.3 g/dL (ref 30.0–36.0)
MCV: 81.3 fL (ref 80.0–100.0)
Monocytes Absolute: 0.5 K/uL (ref 0.1–1.0)
Monocytes Relative: 7 %
Neutro Abs: 3.8 K/uL (ref 1.7–7.7)
Neutrophils Relative %: 52 %
Platelets: 315 K/uL (ref 150–400)
RBC: 4.64 MIL/uL (ref 3.87–5.11)
RDW: 15.9 % — ABNORMAL HIGH (ref 11.5–15.5)
WBC: 7.2 K/uL (ref 4.0–10.5)
nRBC: 0 % (ref 0.0–0.2)

## 2024-09-07 LAB — BASIC METABOLIC PANEL WITH GFR
Anion gap: 10 (ref 5–15)
BUN: 34 mg/dL — ABNORMAL HIGH (ref 8–23)
CO2: 26 mmol/L (ref 22–32)
Calcium: 9 mg/dL (ref 8.9–10.3)
Chloride: 108 mmol/L (ref 98–111)
Creatinine, Ser: 1.38 mg/dL — ABNORMAL HIGH (ref 0.44–1.00)
GFR, Estimated: 40 mL/min — ABNORMAL LOW (ref >60)
Glucose, Bld: 109 mg/dL — ABNORMAL HIGH (ref 70–99)
Potassium: 4 mmol/L (ref 3.5–5.1)
Sodium: 145 mmol/L (ref 135–145)

## 2024-09-07 NOTE — Plan of Care (Signed)

## 2024-09-07 NOTE — Discharge Summary (Signed)
 Physician Discharge Summary   [name] MRN: [id number] DOB: 10-Feb-1951  Admit date:     09/04/2024  Discharge date: 09/07/24  Discharge Physician: Drue ONEIDA Potter   PCP: [place] HERO, MD   Recommendations at discharge:  Follow-up with PCP  Discharge Diagnoses: Principal Problem:   Atrial fibrillation with rapid ventricular response (HCC) Active Problems:   Chest pain   Possible new onset CHF (congestive heart failure) (HCC)   CKD stage 3b, GFR 30-44 ml/min (HCC)   History of kidney stones   Hypertension   Diabetes mellitus, type II (HCC)   Atrial fibrillation with RVR (HCC)  Resolved Problems:   * No resolved hospital problems. [facility] Course: From HPI Cynthia Lucas is a 73 y.o. female with medical history significant for A-fib, tremor, DM, HTN, OSA not on CPAP, CKD 3B, history of E. coli pyelonephritis/sepsis secondary to obstructing ureteral stone s/p stent (03/2024), being admitted with A-fib with RVR with possible new onset CHF. She presented with a 1 week history of intermittent chest pain.  She describes sharp shooting pains in her left chest that initially happen at night while she was lying down waking her from sleep, but is now happening intermittently throughout the day.  She has associated fatigue with the episodes .  Denies associated palpitations, nausea vomiting or diaphoresis.  On the day of arrival she noted swelling of her feet, but denies pain.  She denies cough, fever or chills.  Of note, she is intermittently compliant with her metoprolol , and states she has not taken her Eliquis  since her stent exchange in August. With EMS heart rate was 80-160.  They administered aspirin 324 en route. In the ED, afebrile, heart rate in the 120s, essentially unchanged after repeat doses of metoprolol .  Blood pressure remained stable in the 130s over 80s. Labs notable for normal CBC with baseline anemia (hemoglobin 11), troponin<15 and proBNP 3437.  Potassium and  magnesium  normal, creatinine at baseline at 1.53 EKG showed A-fib at 114 Bilateral lower extremity venous Doppler negative for DVT, did show a right sided Baker's cyst CTA PE protocol negative for PE or other acute intrathoracic process.  Chest x-ray did show some vascular congestion and cardiomegaly   Patient was treated with metoprolol  25 mg p.o. x 2 and IV metoprolol  5 mg x 2 as well as with Lasix  40 mg IV x 1 Admission requested      Detailed hospital course as noted below:  Assessment and Plan:   Atrial fibrillation with rapid ventricular response (HCC) Patient received some IV fluid as well as IV metoprolol  with improvement in heart rates Continue Eliquis    Chest pain-resolved History of low risk nuclear stress test 2022 Presents with 1 week of intermittent chest pain, mostly atypical  troponin<15 and EKG nonacute CTA chest negative for acute PE Monitor for improvement in chest pain with rate control Continue atorvastatin , apixaban , metoprolol  and losartan   Echo reviewed showing EF 40 to 45% which is slightly decreased from previous EF of 55 to 60% Patient encouraged to follow-up with cardiology as an outpatient.  She tells me she has a cardiologist as an outpatient, I see, she last followed up with a cardiologist in August 2025 and have encouraged her to follow-up.   HFrEF (congestive heart failure) (HCC) patient with proBNP 3437 and chest x-ray showing vascular congestion (CTA chest nonacute) Has fatigue/?shortness of breath and lower extremity edema (lower extremity venous Doppler and CTA chest negative for VTE) Echocardiogram from June 2025  showing EF 55 to 60% and indeterminate diastolic parameters Continue to monitor Daily Lucas with intake and output  Echocardiogram showed EF 40 to 45%   Diabetes mellitus, type II (HCC) Sliding scale insulin  coverage   Hypertension Continue antihypertensives as blood pressure allows   History of kidney stones History of  ureteral stent 03/2024 History of E. coli pyelonephritis secondary to obstructing ureteral stone No acute issues suspected   CKD stage 3b, GFR 30-44 ml/min (HCC) Renal function at baseline  Consultants: None Procedures performed: None Disposition: Home Diet recommendation:  Cardiac diet DISCHARGE MEDICATION: Allergies as of 09/07/2024       Reactions   Nickel Itching, Swelling   Other    Contact lense solution causes eye irritation        Medication List     STOP taking these medications    Tradjenta 5 MG Tabs tablet Generic drug: linagliptin       TAKE these medications    apixaban  5 MG Tabs tablet Commonly known as: ELIQUIS  Take 1 tablet (5 mg total) by mouth 2 (two) times daily. Blood thinner.   atorvastatin  40 MG tablet Commonly known as: LIPITOR Take 1 tablet (40 mg total) by mouth at bedtime. Overdue follow visit.  PLEASE CALL OFFICE TO SCHEDULE APPOINTMENT PRIOR TO NEXT REFILL   erythromycin ophthalmic ointment Place 1 Application into the left eye 3 (three) times daily.   hydrochlorothiazide  12.5 MG tablet Commonly known as: HYDRODIURIL  Take 12.5 mg by mouth daily.   Jardiance  10 MG Tabs tablet Generic drug: empagliflozin  Take 10 mg by mouth daily.   losartan  25 MG tablet Commonly known as: COZAAR  Take 25 mg by mouth daily.   metoprolol  succinate 100 MG 24 hr tablet Commonly known as: TOPROL -XL Take 100 mg by mouth daily.   oxyCODONE  5 MG immediate release tablet Commonly known as: Oxy IR/ROXICODONE  Take 1 tablet (5 mg total) by mouth every 6 (six) hours as needed for moderate pain (pain score 4-6) or severe pain (pain score 7-10) (post-operatively).        Follow-up Information     [facility]. Go on 09/24/2024.   Specialty: Cardiology Why: Hospital Follow-Up 09/24/24 @ 9:30 AM Please bring all medications to follow-up appointment [facility], [address] Floor Free Valet  Parking @ the door Contact information: [address] [place]  [address] [phone]               Discharge Exam: Cynthia Lucas   09/04/24 1444 09/06/24 0511 09/07/24 0546  Weight: 93.1 kg 82.9 kg 83.6 kg     General: She is not in acute distress. HENT:     Head: Normocephalic and atraumatic.  Cardiovascular:     Rate and Rhythm: Irregularly irregular    Heart sounds: Normal heart sounds.  Pulmonary:     Effort: Pulmonary effort is normal.     Breath sounds: Normal breath sounds.  Abdominal:     Palpations: Abdomen is soft.     Tenderness: There is no abdominal tenderness.  Musculoskeletal:     Right lower leg: No edema.     Left lower leg: No edema.  Neurological:     Mental Status: Mental status is at baseline  Condition at discharge: good  The results of significant diagnostics from this hospitalization (including imaging, microbiology, ancillary and laboratory) are listed below for reference.   Imaging Studies: ECHOCARDIOGRAM COMPLETE Result Date: 09/05/2024    ECHOCARDIOGRAM REPORT  Patient Name:   AVRY MONTELEONE Date of Exam: 09/05/2024 Medical Rec #:  [id number]     Height:       67.0 in Accession #:    [id number]    Weight:       205.2 lb Date of Birth:  09/14/1951     BSA:          2.045 m Patient Age:    73 years      BP:           129/70 mmHg Patient Gender: F             HR:           92 bpm. Exam Location:  [facility] Procedure: 2D Echo, Cardiac Doppler and Color Doppler (Both Spectral and Color            Flow Doppler were utilized during procedure). Indications:     CHF-Acute Diastolic I50.31  History:         Patient has prior history of Echocardiogram examinations, most                  recent 03/31/2024. CHF.  Sonographer:     Rosina Dunk Referring Phys:  [id number] DELAYNE LULLA SOLIAN Diagnosing Phys: Evalene Lunger MD IMPRESSIONS  1. Left ventricular ejection fraction, by estimation, is 40 to 45%. The left ventricle has mildly  decreased function. The left ventricle demonstrates global hypokinesis. There is mild left ventricular hypertrophy. Left ventricular diastolic parameters are indeterminate.  2. Right ventricular systolic function is moderately reduced. The right ventricular size is normal. There is normal pulmonary artery systolic pressure. The estimated right ventricular systolic pressure is 22.5 mmHg.  3. The mitral valve is normal in structure. Moderate mitral valve regurgitation. No evidence of mitral stenosis.  4. Tricuspid valve regurgitation is moderate.  5. The aortic valve is tricuspid. Aortic valve regurgitation is moderate. No aortic stenosis is present.  6. The inferior vena cava is normal in size with greater than 50% respiratory variability, suggesting right atrial pressure of 3 mmHg  7. Rhythm is atrial fibrillation FINDINGS  Left Ventricle: Left ventricular ejection fraction, by estimation, is 40 to 45%. The left ventricle has mildly decreased function. The left ventricle demonstrates global hypokinesis. Strain was performed and the global longitudinal strain is indeterminate. The left ventricular internal cavity size was normal in size. There is mild left ventricular hypertrophy. Left ventricular diastolic parameters are indeterminate. Right Ventricle: The right ventricular size is normal. No increase in right ventricular wall thickness. Right ventricular systolic function is moderately reduced. There is normal pulmonary artery systolic pressure. The tricuspid regurgitant velocity is 2.09 m/s, and with an assumed right atrial pressure of 5 mmHg, the estimated right ventricular systolic pressure is 22.5 mmHg. Left Atrium: Left atrial size was normal in size. Right Atrium: Right atrial size was normal in size. Pericardium: There is no evidence of pericardial effusion. Mitral Valve: The mitral valve is normal in structure. Moderate mitral valve regurgitation. No evidence of mitral valve stenosis. MV peak gradient, 4.2  mmHg. The mean mitral valve gradient is 2.0 mmHg. Tricuspid Valve: The tricuspid valve is normal in structure. Tricuspid valve regurgitation is moderate . No evidence of tricuspid stenosis. Aortic Valve: The aortic valve is tricuspid. Aortic valve regurgitation is moderate. Aortic regurgitation PHT measures 476 msec. No aortic stenosis is present. Aortic valve mean gradient measures 3.0 mmHg. Aortic valve peak gradient measures 4.5 mmHg. Aortic valve area, by VTI measures 2.07 cm. Pulmonic Valve:  The pulmonic valve was normal in structure. Pulmonic valve regurgitation is mild. No evidence of pulmonic stenosis. Aorta: The aortic root is normal in size and structure. Venous: The inferior vena cava is normal in size with greater than 50% respiratory variability, suggesting right atrial pressure of 3 mmHg. IAS/Shunts: No atrial level shunt detected by color flow Doppler. Additional Comments: 3D was performed not requiring image post processing on an independent workstation and was indeterminate.  LEFT VENTRICLE PLAX 2D LVIDd:         4.30 cm     Diastology LVIDs:         4.00 cm     LV e' medial:    5.26 cm/s LV PW:         1.30 cm     LV E/e' medial:  19.2 LV IVS:        1.10 cm     LV e' lateral:   7.97 cm/s LVOT diam:     2.00 cm     LV E/e' lateral: 12.7 LV SV:         35 LV SV Index:   17 LVOT Area:     3.14 cm LV IVRT:       88 msec  LV Volumes (MOD) LV vol d, MOD A2C: 62.2 ml LV vol d, MOD A4C: 54.2 ml LV vol s, MOD A2C: 29.1 ml LV vol s, MOD A4C: 27.7 ml LV SV MOD A2C:     33.1 ml LV SV MOD A4C:     54.2 ml LV SV MOD BP:      28.6 ml RIGHT VENTRICLE RV Basal diam:  4.40 cm RV Mid diam:    4.40 cm RV S prime:     8.59 cm/s TAPSE (M-mode): 1.2 cm LEFT ATRIUM             Index        RIGHT ATRIUM           Index LA diam:        3.50 cm 1.71 cm/m   RA Area:     18.20 cm LA Vol (A2C):   62.3 ml 30.47 ml/m  RA Volume:   55.80 ml  27.29 ml/m LA Vol (A4C):   67.5 ml 33.01 ml/m LA Biplane Vol: 64.9 ml 31.74 ml/m   AORTIC VALVE                    PULMONIC VALVE AV Area (Vmax):    1.80 cm     PV Vmax:          0.96 m/s AV Area (Vmean):   1.85 cm     PV Vmean:         68.800 cm/s AV Area (VTI):     2.07 cm     PV VTI:           0.183 m AV Vmax:           106.00 cm/s  PV Peak grad:     3.6 mmHg AV Vmean:          78.600 cm/s  PV Mean grad:     2.0 mmHg AV VTI:            0.167 m      PR End Diast Vel: 11.02 msec AV Peak Grad:      4.5 mmHg     RVOT Peak grad:   2 mmHg AV Mean Grad:  3.0 mmHg LVOT Vmax:         60.70 cm/s LVOT Vmean:        46.400 cm/s LVOT VTI:          0.110 m LVOT/AV VTI ratio: 0.66 AI PHT:            476 msec  AORTA Ao Root diam: 3.20 cm Ao Asc diam:  4.00 cm MITRAL VALVE                TRICUSPID VALVE MV Area (PHT): 5.13 cm     TR Peak grad:   17.5 mmHg MV Area VTI:   3.29 cm     TR Mean grad:   13.0 mmHg MV Peak grad:  4.2 mmHg     TR Vmax:        209.00 cm/s MV Mean grad:  2.0 mmHg     TR Vmean:       175.0 cm/s MV Vmax:       1.02 m/s MV Vmean:      69.9 cm/s    SHUNTS MV Decel Time: 148 msec     Systemic VTI:  0.11 m MR Peak grad: 82.4 mmHg     Systemic Diam: 2.00 cm MR Mean grad: 55.0 mmHg     Pulmonic VTI:  0.097 m MR Vmax:      454.00 cm/s MR Vmean:     357.0 cm/s MV E velocity: 101.00 cm/s Evalene Lunger MD Electronically signed by Evalene Lunger MD Signature Date/Time: 09/05/2024/1:29:25 PM    Final    CT Angio Chest PE W and/or Wo Contrast Result Date: 09/04/2024 CLINICAL DATA:  Intermittent chest pain x1 week EXAM: CT ANGIOGRAPHY CHEST WITH CONTRAST TECHNIQUE: Multidetector CT imaging of the chest was performed using the standard protocol during bolus administration of intravenous contrast. Multiplanar CT image reconstructions and MIPs were obtained to evaluate the vascular anatomy. RADIATION DOSE REDUCTION: This exam was performed according to the departmental dose-optimization program which includes automated exposure control, adjustment of the mA and/or kV according to patient size  and/or use of iterative reconstruction technique. CONTRAST:  60mL OMNIPAQUE  IOHEXOL  350 MG/ML SOLN COMPARISON:  None Available. FINDINGS: Cardiovascular: Satisfactory opacification of the pulmonary arteries to the segmental level. No evidence of pulmonary embolism. Normal heart size with moderate severity coronary artery calcification. No pericardial effusion. Mediastinum/Nodes: No enlarged mediastinal, hilar, or axillary lymph nodes. Thyroid gland, trachea, and esophagus demonstrate no significant findings. Lungs/Pleura: Mild areas of scarring and/or atelectasis are seen within the posterior left upper lobe and posteromedial right lung base. No acute infiltrate, pleural effusion or pneumothorax is identified. Upper Abdomen: There is moderate severity elevation of the right hemidiaphragm. Posterior and lateral gallbladder wall calcification is present. No acute intraabdominal abnormality is seen. Musculoskeletal: No chest wall abnormality. No acute or significant osseous findings. Review of the MIP images confirms the above findings. IMPRESSION: 1. No evidence of pulmonary embolism or other acute intrathoracic process. 2. Moderate severity coronary artery calcification. 3. Moderate severity elevation of the right hemidiaphragm. 4. Mild gallbladder wall calcification. Correlation with nonemergent right upper quadrant ultrasound is recommended. Electronically Signed   By: Suzen Dials M.D.   On: 09/04/2024 19:16   US  Venous Img Lower Bilateral Result Date: 09/04/2024 CLINICAL DATA:  Bilateral lower extremity swelling EXAM: BILATERAL LOWER EXTREMITY VENOUS DOPPLER ULTRASOUND TECHNIQUE: Gray-scale sonography with graded compression, as well as color Doppler and duplex ultrasound were performed to evaluate the lower extremity deep venous systems from the level  of the common femoral vein and including the common femoral, femoral, profunda femoral, popliteal and calf veins including the posterior tibial, peroneal  and gastrocnemius veins when visible. The superficial great saphenous vein was also interrogated. Spectral Doppler was utilized to evaluate flow at rest and with distal augmentation maneuvers in the common femoral, femoral and popliteal veins. COMPARISON:  None Available. FINDINGS: RIGHT LOWER EXTREMITY Common Femoral Vein: No evidence of thrombus. Normal compressibility, respiratory phasicity and response to augmentation. Saphenofemoral Junction: No evidence of thrombus. Normal compressibility and flow on color Doppler imaging. Profunda Femoral Vein: No evidence of thrombus. Normal compressibility and flow on color Doppler imaging. Femoral Vein: No evidence of thrombus. Normal compressibility, respiratory phasicity and response to augmentation. Popliteal Vein: No evidence of thrombus. Normal compressibility, respiratory phasicity and response to augmentation. Calf Veins: No evidence of thrombus. Normal compressibility and flow on color Doppler imaging. Superficial Great Saphenous Vein: No evidence of thrombus. Normal compressibility. Venous Reflux:  None. Other Findings: Small fluid collection in the popliteal fossa measuring 4.8 x 1.1 x 3.0 cm consistent with a Baker's cyst. LEFT LOWER EXTREMITY Common Femoral Vein: No evidence of thrombus. Normal compressibility, respiratory phasicity and response to augmentation. Saphenofemoral Junction: No evidence of thrombus. Normal compressibility and flow on color Doppler imaging. Profunda Femoral Vein: No evidence of thrombus. Normal compressibility and flow on color Doppler imaging. Femoral Vein: No evidence of thrombus. Normal compressibility, respiratory phasicity and response to augmentation. Popliteal Vein: No evidence of thrombus. Normal compressibility, respiratory phasicity and response to augmentation. Calf Veins: No evidence of thrombus. Normal compressibility and flow on color Doppler imaging. Superficial Great Saphenous Vein: No evidence of thrombus. Normal  compressibility. Venous Reflux:  None. Other Findings:  None. IMPRESSION: No evidence of deep venous thrombosis in either lower extremity. Small right-sided Baker's cyst. Electronically Signed   By: Wilkie Lent M.D.   On: 09/04/2024 16:58   DG Chest 2 View Result Date: 09/04/2024 CLINICAL DATA:  Chest pain, atrial fibrillation EXAM: CHEST - 2 VIEW COMPARISON:  03/31/2024 FINDINGS: Similar cardiac enlargement with central vascular congestion and bibasilar atelectasis. Slightly lower lung volumes with elevation of the right hemidiaphragm as before. No definite focal pneumonia, large effusion, or pneumothorax. Trachea midline. Degenerative changes of the spine. IMPRESSION: Cardiomegaly with vascular congestion and bibasilar atelectasis. Electronically Signed   By: CHRISTELLA.  Shick M.D.   On: 09/04/2024 15:46    Microbiology: Results for orders placed or performed during the hospital encounter of 03/31/24  Culture, blood (routine x 2)     Status: Abnormal   Collection Time: 03/31/24  6:26 AM   Specimen: BLOOD  Result Value Ref Range Status   Specimen Description   Final    BLOOD RIGHT ARM Performed at [facility], [address], [place], [place] [address]    Special Requests   Final    BOTTLES DRAWN AEROBIC AND ANAEROBIC Blood Culture results may not be optimal due to an inadequate volume of blood received in culture bottles Performed at [facility], [address] [place], [place] [address]    Culture  Setup Time   Final    GRAM NEGATIVE RODS IN BOTH AEROBIC AND ANAEROBIC BOTTLES CRITICAL VALUE NOTED.  VALUE IS CONSISTENT WITH PREVIOUSLY REPORTED AND CALLED VALUE. Performed at [facility], [address] [place], [place] [address]    Culture (A)  Final    ESCHERICHIA COLI SUSCEPTIBILITIES PERFORMED ON PREVIOUS CULTURE WITHIN THE LAST 5 DAYS. Performed at [facility],  [address], [place], [place] [address]    Report Status 04/03/2024 FINAL   Final  Resp panel by RT-PCR (RSV, Flu A&B, Covid) Urine, Catheterized     Status: None   Collection Time: 03/31/24  6:26 AM   Specimen: Urine, Catheterized; Nasal Swab  Result Value Ref Range Status   SARS Coronavirus 2 by RT PCR NEGATIVE NEGATIVE Final    Comment: (NOTE) SARS-CoV-2 target nucleic acids are NOT DETECTED.  The SARS-CoV-2 RNA is generally detectable in upper respiratory specimens during the acute phase of infection. The lowest concentration of SARS-CoV-2 viral copies this assay can detect is 138 copies/mL. A negative result does not preclude SARS-Cov-2 infection and should not be used as the sole basis for treatment or other patient management decisions. A negative result may occur with  improper specimen collection/handling, submission of specimen other than nasopharyngeal swab, presence of viral mutation(s) within the areas targeted by this assay, and inadequate number of viral copies(<138 copies/mL). A negative result must be combined with clinical observations, patient history, and epidemiological information. The expected result is Negative.  Fact Sheet for Patients:  bloggercourse.com  Fact Sheet for Healthcare Providers:  seriousbroker.it  This test is no t yet approved or cleared by the [place]  FDA and  has been authorized for detection and/or diagnosis of SARS-CoV-2 by FDA under an Emergency Use Authorization (EUA). This EUA will remain  in effect (meaning this test can be used) for the duration of the COVID-19 declaration under Section 564(b)(1) of the Act, 21 U.S.C.section 360bbb-3(b)(1), unless the authorization is terminated  or revoked sooner.       Influenza A by PCR NEGATIVE NEGATIVE Final   Influenza B by PCR NEGATIVE NEGATIVE Final    Comment: (NOTE) The Xpert Xpress SARS-CoV-2/FLU/RSV plus assay is intended as an aid in the diagnosis of influenza from Nasopharyngeal swab specimens  and should not be used as a sole basis for treatment. Nasal washings and aspirates are unacceptable for Xpert Xpress SARS-CoV-2/FLU/RSV testing.  Fact Sheet for Patients: bloggercourse.com  Fact Sheet for Healthcare Providers: seriousbroker.it  This test is not yet approved or cleared by the [place]  FDA and has been authorized for detection and/or diagnosis of SARS-CoV-2 by FDA under an Emergency Use Authorization (EUA). This EUA will remain in effect (meaning this test can be used) for the duration of the COVID-19 declaration under Section 564(b)(1) of the Act, 21 U.S.C. section 360bbb-3(b)(1), unless the authorization is terminated or revoked.     Resp Syncytial Virus by PCR NEGATIVE NEGATIVE Final    Comment: (NOTE) Fact Sheet for Patients: bloggercourse.com  Fact Sheet for Healthcare Providers: seriousbroker.it  This test is not yet approved or cleared by the [place]  FDA and has been authorized for detection and/or diagnosis of SARS-CoV-2 by FDA under an Emergency Use Authorization (EUA). This EUA will remain in effect (meaning this test can be used) for the duration of the COVID-19 declaration under Section 564(b)(1) of the Act, 21 U.S.C. section 360bbb-3(b)(1), unless the authorization is terminated or revoked.  Performed at [facility], [address] [place], [place] [address]   Urine Culture     Status: Abnormal   Collection Time: 03/31/24  6:26 AM   Specimen: Urine, Random  Result Value Ref Range Status   Specimen Description   Final    URINE, RANDOM Performed at [facility], [address], [place], [place] [address]    Special Requests   Final    NONE  Reflexed from [phone] Performed at [facility], [address] [place], [place] [address]    Culture >=100,000 COLONIES/mL ESCHERICHIA COLI (A)  Final   Report  Status 04/02/2024 FINAL  Final   Organism ID, Bacteria ESCHERICHIA COLI (A)  Final      Susceptibility   Escherichia coli - MIC*    AMPICILLIN <=2 SENSITIVE Sensitive     CEFAZOLIN  <=4 SENSITIVE Sensitive     CEFEPIME  <=0.12 SENSITIVE Sensitive     CEFTRIAXONE  <=0.25 SENSITIVE Sensitive     CIPROFLOXACIN  <=0.25 SENSITIVE Sensitive     GENTAMICIN  2 SENSITIVE Sensitive     IMIPENEM 1 SENSITIVE Sensitive     NITROFURANTOIN  <=16 SENSITIVE Sensitive     TRIMETH/SULFA <=20 SENSITIVE Sensitive     AMPICILLIN/SULBACTAM <=2 SENSITIVE Sensitive     PIP/TAZO <=4 SENSITIVE Sensitive ug/mL    * >=100,000 COLONIES/mL ESCHERICHIA COLI  Culture, blood (routine x 2)     Status: Abnormal   Collection Time: 03/31/24  6:29 AM   Specimen: BLOOD  Result Value Ref Range Status   Specimen Description   Final    BLOOD LEFT ARM Performed at [facility] [facility], [address] [place], [place] [address]    Special Requests   Final    BOTTLES DRAWN AEROBIC AND ANAEROBIC Blood Culture results may not be optimal due to an inadequate volume of blood received in culture bottles Performed at [facility], [address] [place], [place] [address]    Culture  Setup Time   Final    GRAM NEGATIVE RODS IN BOTH AEROBIC AND ANAEROBIC BOTTLES CRITICAL RESULT CALLED TO, READ BACK BY AND VERIFIED WITH: NATHAN BELUE, PHARMD @0130  04/01/2024 COP Performed at [facility], [address] [place], [place] [address]    Culture ESCHERICHIA COLI (A)  Final   Report Status 04/03/2024 FINAL  Final   Organism ID, Bacteria ESCHERICHIA COLI  Final   Organism ID, Bacteria ESCHERICHIA COLI  Final      Susceptibility   Escherichia coli - KIRBY BAUER*    CEFAZOLIN  SENSITIVE Sensitive    Escherichia coli - MIC*    AMPICILLIN <=2 SENSITIVE Sensitive     CEFEPIME  <=0.12 SENSITIVE Sensitive     CEFTAZIDIME <=1 SENSITIVE Sensitive     CEFTRIAXONE  <=0.25 SENSITIVE Sensitive     CIPROFLOXACIN  <=0.25 SENSITIVE  Sensitive     GENTAMICIN  2 SENSITIVE Sensitive     IMIPENEM 1 SENSITIVE Sensitive     TRIMETH/SULFA <=20 SENSITIVE Sensitive     AMPICILLIN/SULBACTAM <=2 SENSITIVE Sensitive     PIP/TAZO <=4 SENSITIVE Sensitive ug/mL    * ESCHERICHIA COLI    ESCHERICHIA COLI  Blood Culture ID Panel (Reflexed)     Status: Abnormal   Collection Time: 03/31/24  6:29 AM  Result Value Ref Range Status   Enterococcus faecalis NOT DETECTED NOT DETECTED Final   Enterococcus Faecium NOT DETECTED NOT DETECTED Final   Listeria monocytogenes NOT DETECTED NOT DETECTED Final   Staphylococcus species NOT DETECTED NOT DETECTED Final   Staphylococcus aureus (BCID) NOT DETECTED NOT DETECTED Final   Staphylococcus epidermidis NOT DETECTED NOT DETECTED Final   Staphylococcus lugdunensis NOT DETECTED NOT DETECTED Final   Streptococcus species NOT DETECTED NOT DETECTED Final   Streptococcus agalactiae NOT DETECTED NOT DETECTED Final   Streptococcus pneumoniae NOT DETECTED NOT DETECTED Final   Streptococcus pyogenes NOT DETECTED NOT DETECTED Final   A.calcoaceticus-baumannii NOT DETECTED NOT DETECTED Final   Bacteroides fragilis NOT DETECTED NOT DETECTED  Final   Enterobacterales DETECTED (A) NOT DETECTED Final    Comment: Enterobacterales represent a large order of gram negative bacteria, not a single organism. CRITICAL RESULT CALLED TO, READ BACK BY AND VERIFIED WITH: NATHAN BELUE, PHARMD @0130  04/01/2024 COP    Enterobacter cloacae complex NOT DETECTED NOT DETECTED Final   Escherichia coli DETECTED (A) NOT DETECTED Final    Comment: CRITICAL RESULT CALLED TO, READ BACK BY AND VERIFIED WITH: NATHAN BELUE, PHARMD @0130  04/01/2024 COP    Klebsiella aerogenes NOT DETECTED NOT DETECTED Final   Klebsiella oxytoca NOT DETECTED NOT DETECTED Final   Klebsiella pneumoniae NOT DETECTED NOT DETECTED Final   Proteus species NOT DETECTED NOT DETECTED Final   Salmonella species NOT DETECTED NOT DETECTED Final   Serratia  marcescens NOT DETECTED NOT DETECTED Final   Haemophilus influenzae NOT DETECTED NOT DETECTED Final   Neisseria meningitidis NOT DETECTED NOT DETECTED Final   Pseudomonas aeruginosa NOT DETECTED NOT DETECTED Final   Stenotrophomonas maltophilia NOT DETECTED NOT DETECTED Final   Candida albicans NOT DETECTED NOT DETECTED Final   Candida auris NOT DETECTED NOT DETECTED Final   Candida glabrata NOT DETECTED NOT DETECTED Final   Candida krusei NOT DETECTED NOT DETECTED Final   Candida parapsilosis NOT DETECTED NOT DETECTED Final   Candida tropicalis NOT DETECTED NOT DETECTED Final   Cryptococcus neoformans/gattii NOT DETECTED NOT DETECTED Final   CTX-M ESBL NOT DETECTED NOT DETECTED Final   Carbapenem resistance IMP NOT DETECTED NOT DETECTED Final   Carbapenem resistance KPC NOT DETECTED NOT DETECTED Final   Carbapenem resistance NDM NOT DETECTED NOT DETECTED Final   Carbapenem resist OXA 48 LIKE NOT DETECTED NOT DETECTED Final   Carbapenem resistance VIM NOT DETECTED NOT DETECTED Final    Comment: Performed at [facility], [address] [place], [place] [address]    Labs: CBC: Recent Labs  Lab 09/04/24 1451 09/06/24 0409 09/07/24 0521  WBC 8.3 7.3 7.2  NEUTROABS  --  3.6 3.8  HGB 11.0* 11.3* 11.8*  HCT 34.4* 36.0 37.7  MCV 82.1 82.6 81.3  PLT 293 295 315   Basic Metabolic Panel: Recent Labs  Lab 09/04/24 1451 09/04/24 1700 09/05/24 0511 09/06/24 0409 09/07/24 0521  NA 142  --  143 143 145  K 4.0  --  3.4* 4.0 4.0  CL 108  --  106 106 108  CO2 23  --  25 28 26   GLUCOSE 110*  --  102* 104* 109*  BUN 27*  --  28* 33* 34*  CREATININE 1.53*  --  1.50* 1.71* 1.38*  CALCIUM  8.8*  --  8.9 9.2 9.0  MG  --  2.1  --   --   --    Liver Function Tests: No results for input(s): AST, ALT, ALKPHOS, BILITOT, PROT, ALBUMIN in the last 168 hours. CBG: No results for input(s): GLUCAP in the last 168 hours.  Discharge time spent:  36  minutes.  Signed: Drue ONEIDA Potter, MD [facility] 09/07/2024

## 2024-09-07 NOTE — Progress Notes (Signed)
 AVS reviewed and given to patient. PIV removed. All questions answered.

## 2024-09-23 ENCOUNTER — Telehealth: Payer: Self-pay | Admitting: Cardiology

## 2024-09-23 NOTE — Telephone Encounter (Signed)
 Called to confirm/remind patient of their appointment at the [facility] on 09/24/24.   Appointment:   [] Confirmed  [] Left mess   [] No answer/No voice mail  [x] VM Full/unable to leave message  [] Phone not in service  Patient reminded to bring all medications and/or complete list.  Confirmed patient has transportation. Gave directions, instructed to utilize valet parking.

## 2024-09-24 ENCOUNTER — Ambulatory Visit

## 2024-09-24 NOTE — Progress Notes (Deleted)
 "    HEART & VASCULAR TRANSITION OF CARE CONSULT NOTE   Referring Physician: Dr. Dorinda PCP: Buren Rock HERO, MD  Cardiologist: Deatrice Cage, MD  HPI: Referred to clinic by *** for heart failure consultation.   Cynthia Lucas is a 73 y.o. female with history paroxysmal atrial fibrillation, ascending thoracic aneurysm, HTN, HLD, T2DM, OSA not on CPAP, and GIB.   Cardiac history dates back to 3/22 when she presented with GIB and new onset Afib RVR. She was rate controlled with diltaizem. 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 Echo showed EF 55-60%, no WMAs, mild LVH, normal RV, mild/mod MR. Lexiscan  6/22 w/o ischemia.   Presented to ED 6/25 by family for with failure to thrive d/t grief/severe depression. Found to be in Afib with RVR and was admitted for sepsis. Noncompliant with eliquis  and was treated with IV amio. She had repeat cystoscopy with urethreal stent exchange. She was discharged on eliquis  and metoprol to SNF.   Most recently admitted 09/07/24 with with Afib RVR and new onset CHF. Presented with 1 week history of CP. Had been off eliquis  since 6/25 admission. CTPE negative. Echo showed EF 40-45%, moderate RV dysfunction, normal RVSP, mod MRTR, mod AI.    Today {he/she/they:23295} presents for transition of care visit. Overall feeling ***. NYHA ***. Reports {Symptoms; cardiac:12860::dyspnea,fatigue}. Denies {Symptoms; cardiac:12860::chest pain,dyspnea,fatigue,near-syncope,orthopnea,palpitations,dizziness,abnormal bleeding}. Able to perform ADLs. Appetite okay. Weight at home ***. BP at home***. Compliant with all medications. Denies ETOH, tobacco, or drug use.     Past Medical History:  Diagnosis Date   Atrial fibrillation (HCC)    Cough    CHRONIC   Diabetes mellitus without complication (HCC)    Dysrhythmia    GERD (gastroesophageal reflux disease)    History of kidney  stones    Hypertension    Sleep apnea    no cpap   Tremor    HEAD    Current Outpatient Medications  Medication Sig Dispense Refill   apixaban  (ELIQUIS ) 5 MG TABS tablet Take 1 tablet (5 mg total) by mouth 2 (two) times daily. Blood thinner. (Patient not taking: Reported on 09/04/2024) 180 tablet 1   atorvastatin  (LIPITOR) 40 MG tablet Take 1 tablet (40 mg total) by mouth at bedtime. Overdue follow visit.  PLEASE CALL OFFICE TO SCHEDULE APPOINTMENT PRIOR TO NEXT REFILL 30 tablet 1   erythromycin ophthalmic ointment Place 1 Application into the left eye 3 (three) times daily. (Patient not taking: Reported on 09/04/2024)     hydrochlorothiazide  (HYDRODIURIL ) 12.5 MG tablet Take 12.5 mg by mouth daily.     JARDIANCE  10 MG TABS tablet Take 10 mg by mouth daily.     losartan  (COZAAR ) 25 MG tablet Take 25 mg by mouth daily.     metoprolol  succinate (TOPROL -XL) 100 MG 24 hr tablet Take 100 mg by mouth daily.     oxyCODONE  (OXY IR/ROXICODONE ) 5 MG immediate release tablet Take 1 tablet (5 mg total) by mouth every 6 (six) hours as needed for moderate pain (pain score 4-6) or severe pain (pain score 7-10) (post-operatively). 10 tablet 0   No current facility-administered medications for this visit.    Allergies[1]  Social History   Socioeconomic History   Marital status: Divorced    Spouse name: Not on file   Number of children: 2   Years of education: Not on file   Highest education level: 12th grade  Occupational  History   Not on file  Tobacco Use   Smoking status: Never   Smokeless tobacco: Never  Vaping Use   Vaping status: Never Used  Substance and Sexual Activity   Alcohol use: Yes    Comment: OCCAS   Drug use: Never   Sexual activity: Yes    Birth control/protection: None  Other Topics Concern   Not on file  Social History Narrative   Not on file   Social Drivers of Health   Tobacco Use: Low Risk (09/06/2024)   Patient History    Smoking Tobacco Use: Never     Smokeless Tobacco Use: Never    Passive Exposure: Not on file  Financial Resource Strain: Low Risk (09/06/2024)   Overall Financial Resource Strain (CARDIA)    Difficulty of Paying Living Expenses: Not very hard  Food Insecurity: No Food Insecurity (09/06/2024)   Epic    Worried About Programme Researcher, Broadcasting/film/video in the Last Year: Never true    Ran Out of Food in the Last Year: Never true  Transportation Needs: Patient Declined (03/31/2024)   Epic    Lack of Transportation (Medical): Patient declined    Lack of Transportation (Non-Medical): Patient declined  Physical Activity: Not on file  Stress: Not on file  Social Connections: Patient Declined (03/31/2024)   Social Connection and Isolation Panel    Frequency of Communication with Friends and Family: Patient declined    Frequency of Social Gatherings with Friends and Family: Patient declined    Attends Religious Services: Patient declined    Active Member of Clubs or Organizations: Patient declined    Attends Banker Meetings: Patient declined    Marital Status: Patient declined  Intimate Partner Violence: Patient Declined (03/31/2024)   Epic    Fear of Current or Ex-Partner: Patient declined    Emotionally Abused: Patient declined    Physically Abused: Patient declined    Sexually Abused: Patient declined  Depression (PHQ2-9): Not on file  Alcohol Screen: Not on file  Housing: Unknown (09/06/2024)   Epic    Unable to Pay for Housing in the Last Year: No    Number of Times Moved in the Last Year: Not on file    Homeless in the Last Year: No  Utilities: Not At Risk (09/06/2024)   Epic    Threatened with loss of utilities: No  Health Literacy: Not on file    Family History  Problem Relation Age of Onset   Heart disease Mother    Breast cancer Neg Hx     There were no vitals filed for this visit.  There were no vitals filed for this visit.  PHYSICAL EXAM: General: *** appearing. No distress  Cardiac: JVP ~*** cm. No  murmurs  Resp: Lung sounds clear and equal B/L Abdomen: Soft, non-distended.  Extremities: Warm and dry.  *** edema.  Neuro: A&O x3. Affect pleasant.   ECG (personally reviewed):  ASSESSMENT & PLAN:  Chronic HFmrEF - New onset. Echo 12/25 EF 40-45%, mod reduced RV. 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 - NYHA ***. *** - continue *** - GDMT: ? blocker: continue toprol  XL 100 mg daily ARB/ARNI: continue losartan  25 mg daily MRA:  SGLT2i:  Paroxysmal Afib - continue eliquis  (noncompliance?) - continue toprol  100 mg   CKD 3b - obstructive  - s/p urethral stent placement and now exchange 6/25 - baseline Cr ~1.3-1.6 - BMET today   Referred to HFSW (PCP,  Medications, Transportation, ETOH Abuse, Drug Abuse, Insurance, Financial ): {yes/no:20286} Refer to Pharmacy: {yes/no:20286} Refer to Home Health: {yes/no:20286} Refer to [facility]: {yes/no:20286}  Refer to General Cardiology: {yes/no:20286}  Follow up with ***  Carlinda Ohlson, NP 09/24/2024     [1]  Allergies Allergen Reactions   Nickel Itching and Swelling   Other     Contact lense solution causes eye irritation   "
